# Patient Record
Sex: Male | Born: 1935
Health system: Southern US, Community
[De-identification: ages and names within clinical notes are randomized; demographics above are authoritative.]

## PROBLEM LIST (undated history)

## (undated) DIAGNOSIS — F39 Unspecified mood [affective] disorder: Secondary | ICD-10-CM

## (undated) DIAGNOSIS — C801 Malignant (primary) neoplasm, unspecified: Secondary | ICD-10-CM

## (undated) DIAGNOSIS — N400 Enlarged prostate without lower urinary tract symptoms: Secondary | ICD-10-CM

## (undated) DIAGNOSIS — M1990 Unspecified osteoarthritis, unspecified site: Secondary | ICD-10-CM

## (undated) DIAGNOSIS — G250 Essential tremor: Secondary | ICD-10-CM

## (undated) DIAGNOSIS — E079 Disorder of thyroid, unspecified: Secondary | ICD-10-CM

## (undated) DIAGNOSIS — K219 Gastro-esophageal reflux disease without esophagitis: Secondary | ICD-10-CM

## (undated) DIAGNOSIS — E039 Hypothyroidism, unspecified: Secondary | ICD-10-CM

## (undated) DIAGNOSIS — R0602 Shortness of breath: Secondary | ICD-10-CM

## (undated) DIAGNOSIS — R011 Cardiac murmur, unspecified: Secondary | ICD-10-CM

## (undated) DIAGNOSIS — I10 Essential (primary) hypertension: Secondary | ICD-10-CM

## (undated) DIAGNOSIS — E785 Hyperlipidemia, unspecified: Secondary | ICD-10-CM

## (undated) DIAGNOSIS — G4730 Sleep apnea, unspecified: Secondary | ICD-10-CM

## (undated) DIAGNOSIS — R55 Syncope and collapse: Secondary | ICD-10-CM

## (undated) HISTORY — PX: EYE SURGERY: SHX253

## (undated) HISTORY — DX: Unspecified mood (affective) disorder: F39

## (undated) HISTORY — DX: Shortness of breath: R06.02

## (undated) HISTORY — DX: Disorder of thyroid, unspecified: E07.9

## (undated) HISTORY — DX: Unspecified osteoarthritis, unspecified site: M19.90

## (undated) HISTORY — DX: Essential tremor: G25.0

## (undated) HISTORY — DX: Benign prostatic hyperplasia without lower urinary tract symptoms: N40.0

## (undated) HISTORY — PX: TONSILLECTOMY: SUR1361

## (undated) HISTORY — DX: Hyperlipidemia, unspecified: E78.5

## (undated) HISTORY — DX: Essential (primary) hypertension: I10

## (undated) HISTORY — DX: Gastro-esophageal reflux disease without esophagitis: K21.9

## (undated) HISTORY — PX: APPENDECTOMY: SHX54

## (undated) HISTORY — PX: HEMORROIDECTOMY: SUR656

## (undated) HISTORY — PX: INNER EAR SURGERY: SHX679

---

## 2002-03-02 ENCOUNTER — Ambulatory Visit (HOSPITAL_COMMUNITY): Admission: RE | Admit: 2002-03-02 | Discharge: 2002-03-02 | Payer: Self-pay | Admitting: Gastroenterology

## 2002-03-02 ENCOUNTER — Encounter (INDEPENDENT_AMBULATORY_CARE_PROVIDER_SITE_OTHER): Payer: Self-pay | Admitting: Specialist

## 2003-03-18 ENCOUNTER — Encounter: Admission: RE | Admit: 2003-03-18 | Discharge: 2003-03-18 | Payer: Self-pay | Admitting: Internal Medicine

## 2003-10-21 ENCOUNTER — Encounter: Admission: RE | Admit: 2003-10-21 | Discharge: 2003-10-21 | Payer: Self-pay | Admitting: Internal Medicine

## 2004-03-15 ENCOUNTER — Ambulatory Visit (HOSPITAL_COMMUNITY): Admission: RE | Admit: 2004-03-15 | Discharge: 2004-03-15 | Payer: Self-pay | Admitting: Urology

## 2009-08-29 ENCOUNTER — Encounter: Admission: RE | Admit: 2009-08-29 | Discharge: 2009-08-29 | Payer: Self-pay | Admitting: Gastroenterology

## 2009-08-30 ENCOUNTER — Ambulatory Visit (HOSPITAL_COMMUNITY): Admission: RE | Admit: 2009-08-30 | Discharge: 2009-08-30 | Payer: Self-pay | Admitting: Internal Medicine

## 2009-11-20 ENCOUNTER — Ambulatory Visit: Payer: Self-pay | Admitting: Cardiology

## 2010-05-08 ENCOUNTER — Ambulatory Visit: Payer: Self-pay | Admitting: Cardiology

## 2010-06-03 ENCOUNTER — Encounter: Payer: Self-pay | Admitting: *Deleted

## 2010-06-04 ENCOUNTER — Ambulatory Visit (INDEPENDENT_AMBULATORY_CARE_PROVIDER_SITE_OTHER): Payer: Medicare Other | Admitting: Cardiology

## 2010-06-04 ENCOUNTER — Encounter: Payer: Self-pay | Admitting: Cardiology

## 2010-06-04 DIAGNOSIS — N400 Enlarged prostate without lower urinary tract symptoms: Secondary | ICD-10-CM

## 2010-06-04 DIAGNOSIS — R42 Dizziness and giddiness: Secondary | ICD-10-CM

## 2010-06-04 DIAGNOSIS — E7800 Pure hypercholesterolemia, unspecified: Secondary | ICD-10-CM

## 2010-06-04 DIAGNOSIS — I119 Hypertensive heart disease without heart failure: Secondary | ICD-10-CM | POA: Insufficient documentation

## 2010-06-04 DIAGNOSIS — M1710 Unilateral primary osteoarthritis, unspecified knee: Secondary | ICD-10-CM

## 2010-06-04 NOTE — Progress Notes (Signed)
History of Present Illness: This 77 year old gentleman is seen for a scheduled six-month followup office visit.  He has a history of essential hypertension and hypercholesterolemia.  He had a normal treadmill Cardiolite stress test on 11/08/08.  He denies any recent chest pain or symptoms of congestive heart failure.  He has been having problems with feeling lightheaded when he stands up.  Current Outpatient Prescriptions  Medication Sig Dispense Refill  . amLODipine (NORVASC) 5 MG tablet Take 5 mg by mouth daily.        Marland Kitchen aspirin 81 MG tablet Take 81 mg by mouth daily.        Marland Kitchen atorvastatin (LIPITOR) 80 MG tablet Take 80 mg by mouth daily.        . Cholecalciferol (VITAMIN D) 1000 UNITS capsule Take 2,000 Units by mouth daily.        . Cyanocobalamin (B-12 PO) Take by mouth.        . dutasteride (AVODART) 0.5 MG capsule Take 0.5 mg by mouth daily.        Marland Kitchen levothyroxine (SYNTHROID, LEVOTHROID) 100 MCG tablet Take 100 mcg by mouth daily.        . nebivolol (BYSTOLIC) 5 MG tablet Take 5 mg by mouth at bedtime.        . Omega-3 Fatty Acids (FISH OIL) 300 MG CAPS Take 1 capsule by mouth 2 (two) times daily.        . Silodosin (RAPAFLO) 8 MG CAPS Take by mouth daily.        . Tamsulosin HCl (FLOMAX) 0.4 MG CAPS Take 0.4 mg by mouth daily.          No Known Allergies  Patient Active Problem List  Diagnoses  . Osteoarthritis of knee  . Dizziness  . Benign hypertensive heart disease without heart failure  . Hypercholesterolemia  . BPH (benign prostatic hyperplasia)    History  Smoking status  . Former Smoker -- 2.0 packs/day for 18 years  . Types: Cigarettes  . Quit date: 06/04/1970  Smokeless tobacco  . Never Used    History  Alcohol Use No    Family History  Problem Relation Age of Onset  . Heart attack Father     Review of Systems: Constitutional: no fever chills diaphoresis or fatigue or change in weight.  Head and neck: no hearing loss, no epistaxis, no photophobia or  visual disturbance. Respiratory: No cough, shortness of breath or wheezing. Cardiovascular: No chest pain peripheral edema, palpitations. Gastrointestinal: No abdominal distention, no abdominal pain, no change in bowel habits hematochezia or melena. Genitourinary: No dysuria, no frequency, no urgency, no nocturia. Musculoskeletal:No arthralgias, no back pain, no gait disturbance or myalgias. Neurological: No  headaches, no numbness, no seizures, no syncope, no weakness, no tremors. Hematologic: No lymphadenopathy, no easy bruising. Psychiatric: No confusion, no hallucinations, no sleep disturbance.    Physical Exam: Filed Vitals:   06/04/10 1325  BP: 140/70  Pulse:   His blood pressure supine is 150/70, sitting is 130/70, and standing is 140/70.  The pulse is regular.  The general appearance reveals a well-developed well-nourished elderly gentleman in no acute distress.Pupils equal and reactive.   Extraocular Movements are full.  There is no scleral icterus.  The mouth and pharynx are normal.  The neck is supple.  The carotids reveal no bruits.  The jugular venous pressure is normal.  The thyroid is not enlarged.  There is no lymphadenopathy.The chest is clear to percussion and auscultation. There are no  rales or rhonchi. Expansion of the chest is symmetrical.The precordium is quiet.  The first heart sound is normal.  The second heart sound is physiologically split.  There is no murmur gallop rub or click.  There is no abnormal lift or heave.The abdomen is soft and nontender. Bowel sounds are normal. The liver and spleen are not enlarged. There Are no abdominal masses. There are no bruits.The pedal pulses are good.  There is no phlebitis or edema.  There is no cyanosis or clubbing.   Assessment / Plan: We are going to have him reduce his amlodipine to just one half tablet daily.  If his symptoms of orthostatic hypotension persist he will leave off the amlodipine altogether but he is to continue  his Bystolic.He also had some over-the-counter Dramamine on hand if he has another episode of acute classical vertigo.  Recheck in 6 months for followup office visit.

## 2010-06-04 NOTE — Assessment & Plan Note (Addendum)
Former Scientist, product/process development.  Left knee pain.  Sees Dr. Lovey Newcomer.  Has had shots in knee.His knee problem prevents him from getting much regular intentional walking exercise.

## 2010-06-04 NOTE — Assessment & Plan Note (Addendum)
Recent vertigo while shaving.  Lasted less than 1 hour.The vertigo is characterized by a sensation of the room whirling around.  When he tried to lie down in his bed the vertigo became worse.  He said on his med for less than an hour and the symptoms resolved without therapy.  In addition however he has been experiencing predictable orthostatic hypotension symptoms when he stands up from a sitting position.  He is concerned that his blood pressure medication may be causing these symptoms.  He is on amlodipine,  Bystolic,And Flomax all of which could be contributing.

## 2010-06-04 NOTE — Assessment & Plan Note (Signed)
The patient states that his blood pressure has been good when he has checked it.

## 2010-07-10 NOTE — Op Note (Signed)
NAME:  Randy Sullivan, Randy Sullivan NO.:  [id number]   MEDICAL RECORD NO.:  [id number]                   PATIENT TYPE:  AMB   LOCATION:  ENDO                                 FACILITY:  [facility]   PHYSICIAN:  Petra Kuba, M.D.                 DATE OF BIRTH:  1928/06/06   DATE OF PROCEDURE:  03/02/2002  DATE OF DISCHARGE:                                 OPERATIVE REPORT   PROCEDURE:  Colonoscopy.   INDICATION:  Family history of colon cancer.  Some change in bowel habits.  Due for repeat screening with some diarrhea.  Consent was signed after  risks, benefits, methods, options thoroughly discussed in the office.   MEDICINES USED:  Demerol 50, Versed 5.   DESCRIPTION OF PROCEDURE:  Rectal inspection was pertinent for external  hemorrhoids.  Digital exam was negative.  The pediatric video adjustable  colonoscope was inserted, easily advanced around the colon to the cecum.  This did require some abdominal pressure but no position changes.  On  insertion, some left-sided diverticula were seen but no other abnormalities.  The scope was inserted a short ways in the terminal ileum.  Photodocumentation was obtained.  TI was normal.  A few scattered biopsies  were obtained and put in the first container, and a few random colon  biopsies of normal mucosa were obtained and put in the second container.  On  slow withdrawal through the colon, a proximal tiny transverse polyp was seen  and was hot biopsied x 1.  The left-sided diverticula were confirmed mostly  in the sigmoid as well as a few sigmoid hyperplastic-appearing polyps, some  of which were cold biopsied and put in the third container.  Then random  colon biopsies were obtained and put in the second container.  Once back in  the rectum, the scope was retroflexed, pertinent for some internal  hemorrhoids.  The scope was straightened and readvanced a short ways up the  left side of the colon; air was suctioned and the  scope removed.  The  patient tolerated the procedure well.  There was no obvious immediate  complication.   ENDOSCOPIC DIAGNOSES:  1. Internal-external hemorrhoids.  2. Left-sided diverticulosis .  3. A few hyperplastic-appearing sigmoid polyps, some cold biopsied.  4. One proximal transverse polyp, hot biopsied, a small polyp.  5. Otherwise within normal limits to the terminal ileum, status post random     biopsies throughout.    PLAN:  Await pathology to determine future colonic screening and to see if  microscopic colitis exists.  See how his symptoms go, but call me p.r.n. or  in six weeks to recheck symptoms and make sure no further work-up plans are  needed.  Petra Kuba, M.D.    MEM/MEDQ  D:  03/02/2002  T:  03/02/2002  Job:  [id number]   cc:   Larina Earthly, M.D.  [address]  [place]  [place] [address]  Fax: [phone]

## 2010-10-28 ENCOUNTER — Other Ambulatory Visit: Payer: Self-pay | Admitting: Gastroenterology

## 2011-04-15 DIAGNOSIS — C44519 Basal cell carcinoma of skin of other part of trunk: Secondary | ICD-10-CM | POA: Diagnosis not present

## 2011-04-15 DIAGNOSIS — L570 Actinic keratosis: Secondary | ICD-10-CM | POA: Diagnosis not present

## 2011-04-15 DIAGNOSIS — C44319 Basal cell carcinoma of skin of other parts of face: Secondary | ICD-10-CM | POA: Diagnosis not present

## 2011-04-29 DIAGNOSIS — C44319 Basal cell carcinoma of skin of other parts of face: Secondary | ICD-10-CM | POA: Diagnosis not present

## 2011-06-11 DIAGNOSIS — R1110 Vomiting, unspecified: Secondary | ICD-10-CM | POA: Diagnosis not present

## 2011-06-11 DIAGNOSIS — K2900 Acute gastritis without bleeding: Secondary | ICD-10-CM | POA: Diagnosis not present

## 2011-07-29 DIAGNOSIS — E039 Hypothyroidism, unspecified: Secondary | ICD-10-CM | POA: Diagnosis not present

## 2011-07-29 DIAGNOSIS — E7800 Pure hypercholesterolemia, unspecified: Secondary | ICD-10-CM | POA: Diagnosis not present

## 2011-07-29 DIAGNOSIS — I10 Essential (primary) hypertension: Secondary | ICD-10-CM | POA: Diagnosis not present

## 2011-07-29 DIAGNOSIS — S335XXA Sprain of ligaments of lumbar spine, initial encounter: Secondary | ICD-10-CM | POA: Diagnosis not present

## 2011-07-30 DIAGNOSIS — I10 Essential (primary) hypertension: Secondary | ICD-10-CM | POA: Diagnosis not present

## 2011-07-30 DIAGNOSIS — E7800 Pure hypercholesterolemia, unspecified: Secondary | ICD-10-CM | POA: Diagnosis not present

## 2011-09-08 DIAGNOSIS — M5030 Other cervical disc degeneration, unspecified cervical region: Secondary | ICD-10-CM | POA: Diagnosis not present

## 2011-09-08 DIAGNOSIS — M542 Cervicalgia: Secondary | ICD-10-CM | POA: Diagnosis not present

## 2011-09-08 DIAGNOSIS — I10 Essential (primary) hypertension: Secondary | ICD-10-CM | POA: Diagnosis not present

## 2011-09-08 DIAGNOSIS — M47812 Spondylosis without myelopathy or radiculopathy, cervical region: Secondary | ICD-10-CM | POA: Diagnosis not present

## 2011-09-24 DIAGNOSIS — E039 Hypothyroidism, unspecified: Secondary | ICD-10-CM | POA: Diagnosis not present

## 2011-09-24 DIAGNOSIS — E7800 Pure hypercholesterolemia, unspecified: Secondary | ICD-10-CM | POA: Diagnosis not present

## 2012-01-27 DIAGNOSIS — H04129 Dry eye syndrome of unspecified lacrimal gland: Secondary | ICD-10-CM | POA: Diagnosis not present

## 2012-01-27 DIAGNOSIS — H40019 Open angle with borderline findings, low risk, unspecified eye: Secondary | ICD-10-CM | POA: Diagnosis not present

## 2012-01-27 DIAGNOSIS — H1045 Other chronic allergic conjunctivitis: Secondary | ICD-10-CM | POA: Diagnosis not present

## 2012-01-27 DIAGNOSIS — H2510 Age-related nuclear cataract, unspecified eye: Secondary | ICD-10-CM | POA: Diagnosis not present

## 2012-01-28 DIAGNOSIS — E7800 Pure hypercholesterolemia, unspecified: Secondary | ICD-10-CM | POA: Diagnosis not present

## 2012-01-28 DIAGNOSIS — E039 Hypothyroidism, unspecified: Secondary | ICD-10-CM | POA: Diagnosis not present

## 2012-01-28 DIAGNOSIS — Z125 Encounter for screening for malignant neoplasm of prostate: Secondary | ICD-10-CM | POA: Diagnosis not present

## 2012-01-28 DIAGNOSIS — I10 Essential (primary) hypertension: Secondary | ICD-10-CM | POA: Diagnosis not present

## 2012-01-31 ENCOUNTER — Other Ambulatory Visit: Payer: Self-pay | Admitting: Internal Medicine

## 2012-01-31 ENCOUNTER — Ambulatory Visit
Admission: RE | Admit: 2012-01-31 | Discharge: 2012-01-31 | Disposition: A | Discharge status: Home / Self Care | Payer: Commercial Managed Care - PPO | Source: Ambulatory Visit | Attending: Internal Medicine | Admitting: Internal Medicine

## 2012-01-31 ENCOUNTER — Ambulatory Visit
Admission: RE | Admit: 2012-01-31 | Discharge: 2012-01-31 | Disposition: A | Discharge status: Home / Self Care | Payer: MEDICARE | Source: Ambulatory Visit | Attending: Internal Medicine | Admitting: Internal Medicine

## 2012-01-31 DIAGNOSIS — R0781 Pleurodynia: Secondary | ICD-10-CM

## 2012-01-31 DIAGNOSIS — R0602 Shortness of breath: Secondary | ICD-10-CM | POA: Diagnosis not present

## 2012-01-31 DIAGNOSIS — R071 Chest pain on breathing: Secondary | ICD-10-CM | POA: Diagnosis not present

## 2012-01-31 DIAGNOSIS — I10 Essential (primary) hypertension: Secondary | ICD-10-CM | POA: Diagnosis not present

## 2012-01-31 DIAGNOSIS — R1011 Right upper quadrant pain: Secondary | ICD-10-CM

## 2012-02-04 DIAGNOSIS — Z1212 Encounter for screening for malignant neoplasm of rectum: Secondary | ICD-10-CM | POA: Diagnosis not present

## 2012-02-08 DIAGNOSIS — Z0000 Encounter for general adult medical examination without abnormal findings: Secondary | ICD-10-CM | POA: Diagnosis not present

## 2012-02-08 DIAGNOSIS — Z125 Encounter for screening for malignant neoplasm of prostate: Secondary | ICD-10-CM | POA: Diagnosis not present

## 2012-02-08 DIAGNOSIS — Z1331 Encounter for screening for depression: Secondary | ICD-10-CM | POA: Diagnosis not present

## 2012-02-08 DIAGNOSIS — Z23 Encounter for immunization: Secondary | ICD-10-CM | POA: Diagnosis not present

## 2012-02-08 DIAGNOSIS — R071 Chest pain on breathing: Secondary | ICD-10-CM | POA: Diagnosis not present

## 2012-05-04 DIAGNOSIS — M1710 Unilateral primary osteoarthritis, unspecified knee: Secondary | ICD-10-CM | POA: Diagnosis not present

## 2012-06-07 DIAGNOSIS — M1710 Unilateral primary osteoarthritis, unspecified knee: Secondary | ICD-10-CM | POA: Diagnosis not present

## 2012-06-16 DIAGNOSIS — M1710 Unilateral primary osteoarthritis, unspecified knee: Secondary | ICD-10-CM | POA: Diagnosis not present

## 2012-06-21 DIAGNOSIS — Z85828 Personal history of other malignant neoplasm of skin: Secondary | ICD-10-CM | POA: Diagnosis not present

## 2012-06-21 DIAGNOSIS — L259 Unspecified contact dermatitis, unspecified cause: Secondary | ICD-10-CM | POA: Diagnosis not present

## 2012-06-21 DIAGNOSIS — L570 Actinic keratosis: Secondary | ICD-10-CM | POA: Diagnosis not present

## 2012-06-21 DIAGNOSIS — L821 Other seborrheic keratosis: Secondary | ICD-10-CM | POA: Diagnosis not present

## 2012-06-23 DIAGNOSIS — M1710 Unilateral primary osteoarthritis, unspecified knee: Secondary | ICD-10-CM | POA: Diagnosis not present

## 2012-07-25 DIAGNOSIS — L570 Actinic keratosis: Secondary | ICD-10-CM | POA: Diagnosis not present

## 2012-07-25 DIAGNOSIS — L259 Unspecified contact dermatitis, unspecified cause: Secondary | ICD-10-CM | POA: Diagnosis not present

## 2012-09-04 ENCOUNTER — Other Ambulatory Visit: Payer: Self-pay | Admitting: Internal Medicine

## 2012-09-04 DIAGNOSIS — H6120 Impacted cerumen, unspecified ear: Secondary | ICD-10-CM | POA: Diagnosis not present

## 2012-09-04 DIAGNOSIS — R109 Unspecified abdominal pain: Secondary | ICD-10-CM | POA: Diagnosis not present

## 2012-09-04 DIAGNOSIS — R82998 Other abnormal findings in urine: Secondary | ICD-10-CM | POA: Diagnosis not present

## 2012-09-04 DIAGNOSIS — Z6825 Body mass index (BMI) 25.0-25.9, adult: Secondary | ICD-10-CM | POA: Diagnosis not present

## 2012-09-04 DIAGNOSIS — G250 Essential tremor: Secondary | ICD-10-CM | POA: Diagnosis not present

## 2012-09-04 DIAGNOSIS — I10 Essential (primary) hypertension: Secondary | ICD-10-CM | POA: Diagnosis not present

## 2012-09-04 DIAGNOSIS — R6881 Early satiety: Secondary | ICD-10-CM

## 2012-09-04 DIAGNOSIS — R1011 Right upper quadrant pain: Secondary | ICD-10-CM | POA: Diagnosis not present

## 2012-09-04 DIAGNOSIS — E039 Hypothyroidism, unspecified: Secondary | ICD-10-CM | POA: Diagnosis not present

## 2012-09-06 DIAGNOSIS — H6120 Impacted cerumen, unspecified ear: Secondary | ICD-10-CM | POA: Diagnosis not present

## 2012-09-11 DIAGNOSIS — Z6825 Body mass index (BMI) 25.0-25.9, adult: Secondary | ICD-10-CM | POA: Diagnosis not present

## 2012-09-11 DIAGNOSIS — J309 Allergic rhinitis, unspecified: Secondary | ICD-10-CM | POA: Diagnosis not present

## 2012-09-11 DIAGNOSIS — J040 Acute laryngitis: Secondary | ICD-10-CM | POA: Diagnosis not present

## 2012-09-13 ENCOUNTER — Ambulatory Visit
Admission: RE | Admit: 2012-09-13 | Discharge: 2012-09-13 | Disposition: A | Discharge status: Home / Self Care | Payer: MEDICARE | Source: Ambulatory Visit | Attending: Internal Medicine | Admitting: Internal Medicine

## 2012-09-13 DIAGNOSIS — R6881 Early satiety: Secondary | ICD-10-CM

## 2013-01-23 DIAGNOSIS — H1045 Other chronic allergic conjunctivitis: Secondary | ICD-10-CM | POA: Diagnosis not present

## 2013-01-23 DIAGNOSIS — H40019 Open angle with borderline findings, low risk, unspecified eye: Secondary | ICD-10-CM | POA: Diagnosis not present

## 2013-01-23 DIAGNOSIS — H2510 Age-related nuclear cataract, unspecified eye: Secondary | ICD-10-CM | POA: Diagnosis not present

## 2013-01-23 DIAGNOSIS — H04129 Dry eye syndrome of unspecified lacrimal gland: Secondary | ICD-10-CM | POA: Diagnosis not present

## 2013-01-25 DIAGNOSIS — D485 Neoplasm of uncertain behavior of skin: Secondary | ICD-10-CM | POA: Diagnosis not present

## 2013-01-25 DIAGNOSIS — L280 Lichen simplex chronicus: Secondary | ICD-10-CM | POA: Diagnosis not present

## 2013-01-25 DIAGNOSIS — L820 Inflamed seborrheic keratosis: Secondary | ICD-10-CM | POA: Diagnosis not present

## 2013-01-25 DIAGNOSIS — L821 Other seborrheic keratosis: Secondary | ICD-10-CM | POA: Diagnosis not present

## 2013-02-05 DIAGNOSIS — E7800 Pure hypercholesterolemia, unspecified: Secondary | ICD-10-CM | POA: Diagnosis not present

## 2013-02-05 DIAGNOSIS — I10 Essential (primary) hypertension: Secondary | ICD-10-CM | POA: Diagnosis not present

## 2013-02-05 DIAGNOSIS — Z125 Encounter for screening for malignant neoplasm of prostate: Secondary | ICD-10-CM | POA: Diagnosis not present

## 2013-02-05 DIAGNOSIS — E039 Hypothyroidism, unspecified: Secondary | ICD-10-CM | POA: Diagnosis not present

## 2013-02-12 DIAGNOSIS — E039 Hypothyroidism, unspecified: Secondary | ICD-10-CM | POA: Diagnosis not present

## 2013-02-12 DIAGNOSIS — Z0000 Encounter for general adult medical examination without abnormal findings: Secondary | ICD-10-CM | POA: Diagnosis not present

## 2013-02-12 DIAGNOSIS — K5900 Constipation, unspecified: Secondary | ICD-10-CM | POA: Diagnosis not present

## 2013-02-12 DIAGNOSIS — M1990 Unspecified osteoarthritis, unspecified site: Secondary | ICD-10-CM | POA: Diagnosis not present

## 2013-02-12 DIAGNOSIS — N400 Enlarged prostate without lower urinary tract symptoms: Secondary | ICD-10-CM | POA: Diagnosis not present

## 2013-02-12 DIAGNOSIS — Z800 Family history of malignant neoplasm of digestive organs: Secondary | ICD-10-CM | POA: Diagnosis not present

## 2013-02-12 DIAGNOSIS — E7800 Pure hypercholesterolemia, unspecified: Secondary | ICD-10-CM | POA: Diagnosis not present

## 2013-02-12 DIAGNOSIS — Z125 Encounter for screening for malignant neoplasm of prostate: Secondary | ICD-10-CM | POA: Diagnosis not present

## 2013-02-12 DIAGNOSIS — I10 Essential (primary) hypertension: Secondary | ICD-10-CM | POA: Diagnosis not present

## 2013-02-12 DIAGNOSIS — Z23 Encounter for immunization: Secondary | ICD-10-CM | POA: Diagnosis not present

## 2013-03-14 DIAGNOSIS — Z1212 Encounter for screening for malignant neoplasm of rectum: Secondary | ICD-10-CM | POA: Diagnosis not present

## 2013-05-29 DIAGNOSIS — L819 Disorder of pigmentation, unspecified: Secondary | ICD-10-CM | POA: Diagnosis not present

## 2013-05-29 DIAGNOSIS — L570 Actinic keratosis: Secondary | ICD-10-CM | POA: Diagnosis not present

## 2013-05-29 DIAGNOSIS — K031 Abrasion of teeth: Secondary | ICD-10-CM | POA: Diagnosis not present

## 2013-05-29 DIAGNOSIS — L905 Scar conditions and fibrosis of skin: Secondary | ICD-10-CM | POA: Diagnosis not present

## 2013-05-29 DIAGNOSIS — L820 Inflamed seborrheic keratosis: Secondary | ICD-10-CM | POA: Diagnosis not present

## 2013-05-29 DIAGNOSIS — Z85828 Personal history of other malignant neoplasm of skin: Secondary | ICD-10-CM | POA: Diagnosis not present

## 2013-05-29 DIAGNOSIS — L821 Other seborrheic keratosis: Secondary | ICD-10-CM | POA: Diagnosis not present

## 2013-07-27 DIAGNOSIS — H02839 Dermatochalasis of unspecified eye, unspecified eyelid: Secondary | ICD-10-CM | POA: Diagnosis not present

## 2013-07-27 DIAGNOSIS — H2510 Age-related nuclear cataract, unspecified eye: Secondary | ICD-10-CM | POA: Diagnosis not present

## 2013-08-20 DIAGNOSIS — E039 Hypothyroidism, unspecified: Secondary | ICD-10-CM | POA: Diagnosis not present

## 2013-08-20 DIAGNOSIS — E7800 Pure hypercholesterolemia, unspecified: Secondary | ICD-10-CM | POA: Diagnosis not present

## 2013-08-20 DIAGNOSIS — N400 Enlarged prostate without lower urinary tract symptoms: Secondary | ICD-10-CM | POA: Diagnosis not present

## 2013-08-20 DIAGNOSIS — I10 Essential (primary) hypertension: Secondary | ICD-10-CM | POA: Diagnosis not present

## 2013-08-20 DIAGNOSIS — Z1331 Encounter for screening for depression: Secondary | ICD-10-CM | POA: Diagnosis not present

## 2013-08-20 DIAGNOSIS — S335XXA Sprain of ligaments of lumbar spine, initial encounter: Secondary | ICD-10-CM | POA: Diagnosis not present

## 2013-08-20 DIAGNOSIS — M1990 Unspecified osteoarthritis, unspecified site: Secondary | ICD-10-CM | POA: Diagnosis not present

## 2013-08-20 DIAGNOSIS — K5900 Constipation, unspecified: Secondary | ICD-10-CM | POA: Diagnosis not present

## 2013-08-20 DIAGNOSIS — J309 Allergic rhinitis, unspecified: Secondary | ICD-10-CM | POA: Diagnosis not present

## 2013-09-10 DIAGNOSIS — R339 Retention of urine, unspecified: Secondary | ICD-10-CM | POA: Diagnosis not present

## 2013-09-10 DIAGNOSIS — N139 Obstructive and reflux uropathy, unspecified: Secondary | ICD-10-CM | POA: Diagnosis not present

## 2013-09-10 DIAGNOSIS — K4020 Bilateral inguinal hernia, without obstruction or gangrene, not specified as recurrent: Secondary | ICD-10-CM | POA: Diagnosis not present

## 2013-09-10 DIAGNOSIS — N401 Enlarged prostate with lower urinary tract symptoms: Secondary | ICD-10-CM | POA: Diagnosis not present

## 2013-09-19 DIAGNOSIS — N3941 Urge incontinence: Secondary | ICD-10-CM | POA: Diagnosis not present

## 2013-09-19 DIAGNOSIS — R39198 Other difficulties with micturition: Secondary | ICD-10-CM | POA: Diagnosis not present

## 2013-10-17 DIAGNOSIS — R339 Retention of urine, unspecified: Secondary | ICD-10-CM | POA: Diagnosis not present

## 2013-10-17 DIAGNOSIS — N318 Other neuromuscular dysfunction of bladder: Secondary | ICD-10-CM | POA: Diagnosis not present

## 2013-10-17 DIAGNOSIS — N401 Enlarged prostate with lower urinary tract symptoms: Secondary | ICD-10-CM | POA: Diagnosis not present

## 2013-10-17 DIAGNOSIS — N138 Other obstructive and reflux uropathy: Secondary | ICD-10-CM | POA: Diagnosis not present

## 2013-12-14 DIAGNOSIS — Z23 Encounter for immunization: Secondary | ICD-10-CM | POA: Diagnosis not present

## 2014-02-05 DIAGNOSIS — R350 Frequency of micturition: Secondary | ICD-10-CM | POA: Diagnosis not present

## 2014-02-05 DIAGNOSIS — N3281 Overactive bladder: Secondary | ICD-10-CM | POA: Diagnosis not present

## 2014-02-05 DIAGNOSIS — R3915 Urgency of urination: Secondary | ICD-10-CM | POA: Diagnosis not present

## 2014-02-05 DIAGNOSIS — N3941 Urge incontinence: Secondary | ICD-10-CM | POA: Diagnosis not present

## 2014-02-12 DIAGNOSIS — R350 Frequency of micturition: Secondary | ICD-10-CM | POA: Diagnosis not present

## 2014-02-12 DIAGNOSIS — Z23 Encounter for immunization: Secondary | ICD-10-CM | POA: Diagnosis not present

## 2014-02-12 DIAGNOSIS — N3941 Urge incontinence: Secondary | ICD-10-CM | POA: Diagnosis not present

## 2014-02-12 DIAGNOSIS — R3915 Urgency of urination: Secondary | ICD-10-CM | POA: Diagnosis not present

## 2014-02-26 DIAGNOSIS — R350 Frequency of micturition: Secondary | ICD-10-CM | POA: Diagnosis not present

## 2014-02-26 DIAGNOSIS — N3941 Urge incontinence: Secondary | ICD-10-CM | POA: Diagnosis not present

## 2014-02-26 DIAGNOSIS — R3915 Urgency of urination: Secondary | ICD-10-CM | POA: Diagnosis not present

## 2014-02-27 DIAGNOSIS — Z6825 Body mass index (BMI) 25.0-25.9, adult: Secondary | ICD-10-CM | POA: Diagnosis not present

## 2014-02-27 DIAGNOSIS — M1812 Unilateral primary osteoarthritis of first carpometacarpal joint, left hand: Secondary | ICD-10-CM | POA: Diagnosis not present

## 2014-03-05 DIAGNOSIS — N3941 Urge incontinence: Secondary | ICD-10-CM | POA: Diagnosis not present

## 2014-03-05 DIAGNOSIS — R3915 Urgency of urination: Secondary | ICD-10-CM | POA: Diagnosis not present

## 2014-03-05 DIAGNOSIS — R350 Frequency of micturition: Secondary | ICD-10-CM | POA: Diagnosis not present

## 2014-03-12 DIAGNOSIS — N3941 Urge incontinence: Secondary | ICD-10-CM | POA: Diagnosis not present

## 2014-03-12 DIAGNOSIS — R3915 Urgency of urination: Secondary | ICD-10-CM | POA: Diagnosis not present

## 2014-03-12 DIAGNOSIS — R350 Frequency of micturition: Secondary | ICD-10-CM | POA: Diagnosis not present

## 2014-03-19 DIAGNOSIS — N3281 Overactive bladder: Secondary | ICD-10-CM | POA: Diagnosis not present

## 2014-03-19 DIAGNOSIS — R3915 Urgency of urination: Secondary | ICD-10-CM | POA: Diagnosis not present

## 2014-03-19 DIAGNOSIS — N3941 Urge incontinence: Secondary | ICD-10-CM | POA: Diagnosis not present

## 2014-03-19 DIAGNOSIS — R350 Frequency of micturition: Secondary | ICD-10-CM | POA: Diagnosis not present

## 2014-03-25 DIAGNOSIS — E780 Pure hypercholesterolemia: Secondary | ICD-10-CM | POA: Diagnosis not present

## 2014-03-25 DIAGNOSIS — E559 Vitamin D deficiency, unspecified: Secondary | ICD-10-CM | POA: Diagnosis not present

## 2014-03-25 DIAGNOSIS — Z125 Encounter for screening for malignant neoplasm of prostate: Secondary | ICD-10-CM | POA: Diagnosis not present

## 2014-03-25 DIAGNOSIS — I10 Essential (primary) hypertension: Secondary | ICD-10-CM | POA: Diagnosis not present

## 2014-03-25 DIAGNOSIS — E039 Hypothyroidism, unspecified: Secondary | ICD-10-CM | POA: Diagnosis not present

## 2014-03-26 DIAGNOSIS — N3941 Urge incontinence: Secondary | ICD-10-CM | POA: Diagnosis not present

## 2014-03-28 DIAGNOSIS — Z1212 Encounter for screening for malignant neoplasm of rectum: Secondary | ICD-10-CM | POA: Diagnosis not present

## 2014-04-01 DIAGNOSIS — Z6826 Body mass index (BMI) 26.0-26.9, adult: Secondary | ICD-10-CM | POA: Diagnosis not present

## 2014-04-01 DIAGNOSIS — Z1389 Encounter for screening for other disorder: Secondary | ICD-10-CM | POA: Diagnosis not present

## 2014-04-01 DIAGNOSIS — E559 Vitamin D deficiency, unspecified: Secondary | ICD-10-CM | POA: Diagnosis not present

## 2014-04-01 DIAGNOSIS — E780 Pure hypercholesterolemia: Secondary | ICD-10-CM | POA: Diagnosis not present

## 2014-04-01 DIAGNOSIS — M1990 Unspecified osteoarthritis, unspecified site: Secondary | ICD-10-CM | POA: Diagnosis not present

## 2014-04-01 DIAGNOSIS — H6120 Impacted cerumen, unspecified ear: Secondary | ICD-10-CM | POA: Diagnosis not present

## 2014-04-01 DIAGNOSIS — R251 Tremor, unspecified: Secondary | ICD-10-CM | POA: Diagnosis not present

## 2014-04-01 DIAGNOSIS — K5900 Constipation, unspecified: Secondary | ICD-10-CM | POA: Diagnosis not present

## 2014-04-01 DIAGNOSIS — E039 Hypothyroidism, unspecified: Secondary | ICD-10-CM | POA: Diagnosis not present

## 2014-04-01 DIAGNOSIS — Z008 Encounter for other general examination: Secondary | ICD-10-CM | POA: Diagnosis not present

## 2014-04-01 DIAGNOSIS — I10 Essential (primary) hypertension: Secondary | ICD-10-CM | POA: Diagnosis not present

## 2014-04-01 DIAGNOSIS — Z8719 Personal history of other diseases of the digestive system: Secondary | ICD-10-CM | POA: Diagnosis not present

## 2014-04-02 DIAGNOSIS — N3941 Urge incontinence: Secondary | ICD-10-CM | POA: Diagnosis not present

## 2014-04-09 DIAGNOSIS — R350 Frequency of micturition: Secondary | ICD-10-CM | POA: Diagnosis not present

## 2014-04-09 DIAGNOSIS — N3941 Urge incontinence: Secondary | ICD-10-CM | POA: Diagnosis not present

## 2014-04-09 DIAGNOSIS — R3915 Urgency of urination: Secondary | ICD-10-CM | POA: Diagnosis not present

## 2014-04-23 DIAGNOSIS — R350 Frequency of micturition: Secondary | ICD-10-CM | POA: Diagnosis not present

## 2014-04-23 DIAGNOSIS — R3915 Urgency of urination: Secondary | ICD-10-CM | POA: Diagnosis not present

## 2014-04-23 DIAGNOSIS — N3941 Urge incontinence: Secondary | ICD-10-CM | POA: Diagnosis not present

## 2014-04-30 DIAGNOSIS — R350 Frequency of micturition: Secondary | ICD-10-CM | POA: Diagnosis not present

## 2014-04-30 DIAGNOSIS — N3941 Urge incontinence: Secondary | ICD-10-CM | POA: Diagnosis not present

## 2014-04-30 DIAGNOSIS — R3915 Urgency of urination: Secondary | ICD-10-CM | POA: Diagnosis not present

## 2014-05-02 DIAGNOSIS — E039 Hypothyroidism, unspecified: Secondary | ICD-10-CM | POA: Diagnosis not present

## 2014-05-02 DIAGNOSIS — H6120 Impacted cerumen, unspecified ear: Secondary | ICD-10-CM | POA: Diagnosis not present

## 2014-05-02 DIAGNOSIS — Z6825 Body mass index (BMI) 25.0-25.9, adult: Secondary | ICD-10-CM | POA: Diagnosis not present

## 2014-05-02 DIAGNOSIS — I10 Essential (primary) hypertension: Secondary | ICD-10-CM | POA: Diagnosis not present

## 2014-05-07 DIAGNOSIS — R3915 Urgency of urination: Secondary | ICD-10-CM | POA: Diagnosis not present

## 2014-05-07 DIAGNOSIS — R350 Frequency of micturition: Secondary | ICD-10-CM | POA: Diagnosis not present

## 2014-05-07 DIAGNOSIS — N3281 Overactive bladder: Secondary | ICD-10-CM | POA: Diagnosis not present

## 2014-05-07 DIAGNOSIS — N3941 Urge incontinence: Secondary | ICD-10-CM | POA: Diagnosis not present

## 2014-05-28 DIAGNOSIS — R3915 Urgency of urination: Secondary | ICD-10-CM | POA: Diagnosis not present

## 2014-05-28 DIAGNOSIS — R350 Frequency of micturition: Secondary | ICD-10-CM | POA: Diagnosis not present

## 2014-05-28 DIAGNOSIS — N3941 Urge incontinence: Secondary | ICD-10-CM | POA: Diagnosis not present

## 2014-06-18 DIAGNOSIS — R3915 Urgency of urination: Secondary | ICD-10-CM | POA: Diagnosis not present

## 2014-06-18 DIAGNOSIS — R350 Frequency of micturition: Secondary | ICD-10-CM | POA: Diagnosis not present

## 2014-06-18 DIAGNOSIS — N3941 Urge incontinence: Secondary | ICD-10-CM | POA: Diagnosis not present

## 2014-07-09 DIAGNOSIS — N3941 Urge incontinence: Secondary | ICD-10-CM | POA: Diagnosis not present

## 2014-07-09 DIAGNOSIS — R350 Frequency of micturition: Secondary | ICD-10-CM | POA: Diagnosis not present

## 2014-07-09 DIAGNOSIS — R3915 Urgency of urination: Secondary | ICD-10-CM | POA: Diagnosis not present

## 2014-07-30 DIAGNOSIS — H2513 Age-related nuclear cataract, bilateral: Secondary | ICD-10-CM | POA: Diagnosis not present

## 2014-07-30 DIAGNOSIS — H40013 Open angle with borderline findings, low risk, bilateral: Secondary | ICD-10-CM | POA: Diagnosis not present

## 2014-07-30 DIAGNOSIS — N3941 Urge incontinence: Secondary | ICD-10-CM | POA: Diagnosis not present

## 2014-08-27 ENCOUNTER — Telehealth: Payer: Self-pay | Admitting: Cardiology

## 2014-08-27 DIAGNOSIS — Z6824 Body mass index (BMI) 24.0-24.9, adult: Secondary | ICD-10-CM | POA: Diagnosis not present

## 2014-08-27 DIAGNOSIS — R140 Abdominal distension (gaseous): Secondary | ICD-10-CM | POA: Diagnosis not present

## 2014-08-27 DIAGNOSIS — R0602 Shortness of breath: Secondary | ICD-10-CM | POA: Diagnosis not present

## 2014-08-27 DIAGNOSIS — K5900 Constipation, unspecified: Secondary | ICD-10-CM | POA: Diagnosis not present

## 2014-08-27 DIAGNOSIS — R0789 Other chest pain: Secondary | ICD-10-CM | POA: Diagnosis not present

## 2014-08-27 DIAGNOSIS — R1013 Epigastric pain: Secondary | ICD-10-CM | POA: Diagnosis not present

## 2014-08-27 DIAGNOSIS — I10 Essential (primary) hypertension: Secondary | ICD-10-CM | POA: Diagnosis not present

## 2014-08-27 NOTE — Telephone Encounter (Signed)
New Message  Pt wife calling about pt's current bout of SOB. Pt last saw Dr. Mare Ferrari 05/2010. Please call back and discuss.

## 2014-08-27 NOTE — Telephone Encounter (Signed)
Spoke with patients wife and patient has been having decreased energy, weight loss, and shortness of breath Patients wife states this has been going on for while  Patients has no known cardiac disease Advised wife  Dr. Mare Ferrari out of the office and needs to call PCP, verbalized understanding

## 2014-08-27 NOTE — Telephone Encounter (Signed)
Agree with advice given

## 2014-08-29 DIAGNOSIS — R350 Frequency of micturition: Secondary | ICD-10-CM | POA: Diagnosis not present

## 2014-08-29 DIAGNOSIS — R3915 Urgency of urination: Secondary | ICD-10-CM | POA: Diagnosis not present

## 2014-08-29 DIAGNOSIS — N3941 Urge incontinence: Secondary | ICD-10-CM | POA: Diagnosis not present

## 2014-09-16 DIAGNOSIS — E780 Pure hypercholesterolemia: Secondary | ICD-10-CM | POA: Diagnosis not present

## 2014-09-16 DIAGNOSIS — E559 Vitamin D deficiency, unspecified: Secondary | ICD-10-CM | POA: Diagnosis not present

## 2014-09-16 DIAGNOSIS — E039 Hypothyroidism, unspecified: Secondary | ICD-10-CM | POA: Diagnosis not present

## 2014-09-17 DIAGNOSIS — N3941 Urge incontinence: Secondary | ICD-10-CM | POA: Diagnosis not present

## 2014-09-17 DIAGNOSIS — R3915 Urgency of urination: Secondary | ICD-10-CM | POA: Diagnosis not present

## 2014-09-17 DIAGNOSIS — R350 Frequency of micturition: Secondary | ICD-10-CM | POA: Diagnosis not present

## 2014-10-03 DIAGNOSIS — E039 Hypothyroidism, unspecified: Secondary | ICD-10-CM | POA: Diagnosis not present

## 2014-10-03 DIAGNOSIS — Z8719 Personal history of other diseases of the digestive system: Secondary | ICD-10-CM | POA: Diagnosis not present

## 2014-10-03 DIAGNOSIS — Z6824 Body mass index (BMI) 24.0-24.9, adult: Secondary | ICD-10-CM | POA: Diagnosis not present

## 2014-10-03 DIAGNOSIS — M1990 Unspecified osteoarthritis, unspecified site: Secondary | ICD-10-CM | POA: Diagnosis not present

## 2014-10-03 DIAGNOSIS — R251 Tremor, unspecified: Secondary | ICD-10-CM | POA: Diagnosis not present

## 2014-10-03 DIAGNOSIS — E780 Pure hypercholesterolemia: Secondary | ICD-10-CM | POA: Diagnosis not present

## 2014-10-03 DIAGNOSIS — I10 Essential (primary) hypertension: Secondary | ICD-10-CM | POA: Diagnosis not present

## 2014-10-03 DIAGNOSIS — J302 Other seasonal allergic rhinitis: Secondary | ICD-10-CM | POA: Diagnosis not present

## 2014-10-08 DIAGNOSIS — N3941 Urge incontinence: Secondary | ICD-10-CM | POA: Diagnosis not present

## 2014-10-08 DIAGNOSIS — R3915 Urgency of urination: Secondary | ICD-10-CM | POA: Diagnosis not present

## 2014-10-08 DIAGNOSIS — R350 Frequency of micturition: Secondary | ICD-10-CM | POA: Diagnosis not present

## 2014-10-29 DIAGNOSIS — R3915 Urgency of urination: Secondary | ICD-10-CM | POA: Diagnosis not present

## 2014-10-29 DIAGNOSIS — R350 Frequency of micturition: Secondary | ICD-10-CM | POA: Diagnosis not present

## 2014-10-29 DIAGNOSIS — N3941 Urge incontinence: Secondary | ICD-10-CM | POA: Diagnosis not present

## 2014-10-31 DIAGNOSIS — N138 Other obstructive and reflux uropathy: Secondary | ICD-10-CM | POA: Diagnosis not present

## 2014-10-31 DIAGNOSIS — N401 Enlarged prostate with lower urinary tract symptoms: Secondary | ICD-10-CM | POA: Diagnosis not present

## 2014-11-19 DIAGNOSIS — R350 Frequency of micturition: Secondary | ICD-10-CM | POA: Diagnosis not present

## 2014-11-19 DIAGNOSIS — N3941 Urge incontinence: Secondary | ICD-10-CM | POA: Diagnosis not present

## 2014-11-19 DIAGNOSIS — R3915 Urgency of urination: Secondary | ICD-10-CM | POA: Diagnosis not present

## 2014-11-27 ENCOUNTER — Encounter: Payer: PRIVATE HEALTH INSURANCE | Admitting: Cardiology

## 2014-12-10 DIAGNOSIS — N3941 Urge incontinence: Secondary | ICD-10-CM | POA: Diagnosis not present

## 2014-12-20 ENCOUNTER — Encounter: Payer: Self-pay | Admitting: Cardiovascular Disease

## 2014-12-20 ENCOUNTER — Ambulatory Visit (INDEPENDENT_AMBULATORY_CARE_PROVIDER_SITE_OTHER): Payer: MEDICARE | Admitting: Cardiovascular Disease

## 2014-12-20 VITALS — BP 110/62 | HR 64 | Ht 72.0 in | Wt 180.3 lb

## 2014-12-20 DIAGNOSIS — R0602 Shortness of breath: Secondary | ICD-10-CM | POA: Diagnosis not present

## 2014-12-20 NOTE — Progress Notes (Signed)
Patient ID: CASYN BECVAR, male   DOB: 02/28/1928, 81 y.o.   MRN: [id number]     Cardiology Office Note   Date:  12/20/2014   ID:  Randy Sullivan, DOB [birth year], MRN [id number]  PCP:  Tivis Ringer, MD  Cardiologist:    Sanda Klein, MD   Chief Complaint  Patient presents with  . New Evaluation    SOB, fatigue, dizziness      History of Present Illness: Randy Sullivan is a 81 y.o. male who presents for Evaluation of occasional episodes of dyspnea and fatigue.   he has a long-standing history of systemic hypertension and hyperlipidemia. He last saw a cardiologist, Dr. Warren Danes, in 2012 and has not had any interval cardiac problems.  The chart reports a normal treadmill nuclear stress test in September 2010In the past he took more medications for hypertension but these have not been necessary in the recent past. He has lost quite a bit of weight and his blood pressure is lower. He does take propranolol , but this is indicated for essential tremor, especially prominent in his dominant right hand.   He describes occasional problems with shortness of breath that occurs inconsistently. He notices it when he walks to the bathroom in the middle of the night and then walks back to the bed. 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 He can walk up stairs without stopping to catch his breath. He has occasional mild dizziness in a pattern suggestive of orthostatic hypotension and he takes Flomax for prostatism.   Occasionally he describes very abrupt fatigue or has to stop and rest since he feels "drained". These episodes are also unpredictable. He has never experienced syncope and denies palpitations.   He describes intolerance to cold and is always chilly. He feels sleepy a lot. He is on levothyroxine for hypothyroidism but he had a normal thyroid profile in July  of this year when he saw Dr. Dagmar Hait.   Labs drawn at the same time show total cholesterol 266, triglycerides 181, HDL 30, LDL 200.    Past Medical History  Diagnosis Date  . Hypertension   . Hyperlipidemia   . Shortness of breath   . Prostatism   . Thyroid disease   . Osteoarthritis     No past surgical history on file.   Current Outpatient Prescriptions  Medication Sig Dispense Refill  . aspirin 81 MG tablet Take 81 mg by mouth daily.      . Cholecalciferol (VITAMIN D) 1000 UNITS capsule Take 2,000 Units by mouth daily.      . finasteride (PROSCAR) 5 MG tablet     . levothyroxine (SYNTHROID, LEVOTHROID) 112 MCG tablet Take 112 mcg by mouth daily before breakfast.     . pantoprazole (PROTONIX) 40 MG tablet Take 40 mg by mouth daily.     . propranolol (INDERAL) 10 MG tablet Take 10 mg by mouth 2 (two) times daily.     . Tamsulosin HCl (FLOMAX) 0.4 MG CAPS Take 0.4 mg by mouth daily.       No current facility-administered medications for this visit.    Allergies:   Review of patient's allergies indicates no known allergies.    Social History:  The patient  reports that he quit smoking about 44 years ago. His smoking use included Cigarettes. He has a 36 pack-year smoking history. He has never  used smokeless tobacco. He reports that he does not drink alcohol or use illicit drugs.   Family History:  The patient's family history includes Heart attack in his father.    ROS:  Please see the history of present illness.    Otherwise, review of systems positive for none.   All other systems are reviewed and negative.    PHYSICAL EXAM: VS:  BP 110/62 mmHg  Pulse 64  Ht 6' (1.829 m)  Wt 180 lb 4.8 oz (81.784 kg)  BMI 24.45 kg/m2  SpO2 99% , BMI Body mass index is 24.45 kg/(m^2).  General: Alert, oriented x3, no distress Head: no evidence of trauma, PERRL, EOMI, no exophtalmos or lid lag, no myxedema, no xanthelasma; normal ears, nose and oropharynx Neck: normal jugular venous  pulsations and no hepatojugular reflux; brisk carotid pulses without delay and no carotid bruits Chest: clear to auscultation, no signs of consolidation by percussion or palpation, normal fremitus, symmetrical and full respiratory excursions Cardiovascular: normal position and quality of the apical impulse, regular rhythm, normal first and second heart sounds, no murmurs, rubs or gallops Abdomen: no tenderness or distention, no masses by palpation, no abnormal pulsatility or arterial bruits, normal bowel sounds, no hepatosplenomegaly Extremities: no clubbing, cyanosis or edema; 2+ radial, ulnar and brachial pulses bilaterally; 2+ right femoral, posterior tibial and dorsalis pedis pulses; 2+ left femoral, posterior tibial and dorsalis pedis pulses; no subclavian or femoral bruits Neurological: grossly nonfocal Psych: euthymic mood, full affect   EKG:  EKG is ordered today. The ekg ordered today demonstrates  Normal sinus rhythm, normal tracing   incidental note is made of atherosclerotic ulcerated plaque in his mid descending thoracic aorta on a CT of the chest performed in 2011, study ordered for chest pain.  That same study showed very little if any calcium in the coronary tree   Recent Labs: No results found for requested labs within last 365 days.    Lipid Panel  total cholesterol 266, triglycerides 181, HDL 30, LDL 200.   Wt Readings from Last 3 Encounters:  12/20/14 180 lb 4.8 oz (81.784 kg)  06/04/10 192 lb (87.091 kg)      Other studies Reviewed: Additional studies/ records that were reviewed today include:  Multiple imaging studies and electrocardiograms.   ASSESSMENT AND PLAN:  1.  Inconsistent exertional dyspnea and fatigue. By physical exam, Mr. Lackey appears to have very little evidence of structural heart disease. At times his physical activity is remarkably intense for an 81 year old man and his symptoms are sometimes provoked by very mild exertion such as walking  back from the bathroom. Some of the symptoms he describes are vaguely suggestive of chronotropic incompetence , which in turn might be related to beta blocker therapy and his age.  I have recommended a treadmill stress test  In order to evaluate his functional capacity and an objective manner and also to look for signs of chronotropic incompetence.  2.  History of essential hypertension currently very well controlled  3.  Hypothyroidism well compensated on current dose of levothyroxine  4.  Hyperlipidemia , severely elevated LDL cholesterol and low HDL cholesterol. In the past he took atorvastatin. This has been discontinued. Mr. Russom does not recall having side effects from it. Even though he does not have clearly established significant vascular disease, the LDL elevation is quite striking and would generally lead to a recommendation for statin therapy. Would like to discuss this with Dr. Danne Baxter before restarting it.  Note presence of  aortic atherosclerosis, very little in the way of coronary calcification on CT chest 2011.  5.  Essential tremor , improved after beta blocker therapy according to both Mr. And Mrs. Percell Miller.   Current medicines are reviewed at length with the patient today.  The patient does not have concerns regarding medicines.  The following changes have been made:  no change  Labs/ tests ordered today include:  Orders Placed This Encounter  Procedures  . Exercise Tolerance Test  . EKG 12-Lead    Patient Instructions  Your physician has requested that you have an exercise tolerance test. For further information please visit [email or web address]. Please also follow instruction sheet, as given. We will call with the results.  Dr. Sallyanne Kuster recommends that you schedule a follow-up appointment in: one year       Signed, Sanda Klein, MD  12/20/2014 5:55 PM    Sanda Klein, MD, [facility] [phone] office [phone] pager

## 2014-12-20 NOTE — Patient Instructions (Signed)
Your physician has requested that you have an exercise tolerance test. For further information please visit [email or web address]. Please also follow instruction sheet, as given. We will call with the results.  Dr. Sallyanne Kuster recommends that you schedule a follow-up appointment in: one year

## 2014-12-23 DIAGNOSIS — Z23 Encounter for immunization: Secondary | ICD-10-CM | POA: Diagnosis not present

## 2014-12-26 ENCOUNTER — Encounter: Payer: Self-pay | Admitting: Cardiovascular Disease

## 2014-12-31 DIAGNOSIS — R3915 Urgency of urination: Secondary | ICD-10-CM | POA: Diagnosis not present

## 2014-12-31 DIAGNOSIS — R350 Frequency of micturition: Secondary | ICD-10-CM | POA: Diagnosis not present

## 2014-12-31 DIAGNOSIS — N3941 Urge incontinence: Secondary | ICD-10-CM | POA: Diagnosis not present

## 2015-01-07 DIAGNOSIS — E039 Hypothyroidism, unspecified: Secondary | ICD-10-CM | POA: Diagnosis not present

## 2015-01-07 DIAGNOSIS — F39 Unspecified mood [affective] disorder: Secondary | ICD-10-CM | POA: Diagnosis not present

## 2015-01-07 DIAGNOSIS — Z6824 Body mass index (BMI) 24.0-24.9, adult: Secondary | ICD-10-CM | POA: Diagnosis not present

## 2015-01-07 DIAGNOSIS — J302 Other seasonal allergic rhinitis: Secondary | ICD-10-CM | POA: Diagnosis not present

## 2015-01-07 DIAGNOSIS — I10 Essential (primary) hypertension: Secondary | ICD-10-CM | POA: Diagnosis not present

## 2015-01-07 DIAGNOSIS — E7800 Pure hypercholesterolemia, unspecified: Secondary | ICD-10-CM | POA: Diagnosis not present

## 2015-01-09 ENCOUNTER — Telehealth (HOSPITAL_COMMUNITY): Payer: Self-pay

## 2015-01-09 NOTE — Telephone Encounter (Signed)
Encounter complete. 

## 2015-01-14 ENCOUNTER — Ambulatory Visit (HOSPITAL_COMMUNITY)
Admission: RE | Admit: 2015-01-14 | Discharge: 2015-01-14 | Disposition: A | Discharge status: Home / Self Care | Payer: MEDICARE | Source: Ambulatory Visit | Attending: Cardiovascular Disease | Admitting: Cardiovascular Disease

## 2015-01-14 DIAGNOSIS — R0602 Shortness of breath: Secondary | ICD-10-CM | POA: Insufficient documentation

## 2015-01-14 LAB — EXERCISE TOLERANCE TEST
CSEPEW: 4.8 METS
CSEPHR: 100 %
Exercise duration (min): 3 min
Exercise duration (sec): 10 s
MPHR: 136 {beats}/min
Peak HR: 137 {beats}/min
RPE: 17
Rest HR: 85 {beats}/min

## 2015-01-15 DIAGNOSIS — Z85828 Personal history of other malignant neoplasm of skin: Secondary | ICD-10-CM | POA: Diagnosis not present

## 2015-01-15 DIAGNOSIS — L821 Other seborrheic keratosis: Secondary | ICD-10-CM | POA: Diagnosis not present

## 2015-01-15 DIAGNOSIS — L570 Actinic keratosis: Secondary | ICD-10-CM | POA: Diagnosis not present

## 2015-01-15 DIAGNOSIS — D1801 Hemangioma of skin and subcutaneous tissue: Secondary | ICD-10-CM | POA: Diagnosis not present

## 2015-01-21 DIAGNOSIS — R3915 Urgency of urination: Secondary | ICD-10-CM | POA: Diagnosis not present

## 2015-01-21 DIAGNOSIS — R350 Frequency of micturition: Secondary | ICD-10-CM | POA: Diagnosis not present

## 2015-01-21 DIAGNOSIS — N3941 Urge incontinence: Secondary | ICD-10-CM | POA: Diagnosis not present

## 2015-01-24 ENCOUNTER — Encounter (HOSPITAL_COMMUNITY): Payer: PRIVATE HEALTH INSURANCE

## 2015-02-11 DIAGNOSIS — N3941 Urge incontinence: Secondary | ICD-10-CM | POA: Diagnosis not present

## 2015-03-04 DIAGNOSIS — R3915 Urgency of urination: Secondary | ICD-10-CM | POA: Diagnosis not present

## 2015-03-04 DIAGNOSIS — R350 Frequency of micturition: Secondary | ICD-10-CM | POA: Diagnosis not present

## 2015-03-04 DIAGNOSIS — N3941 Urge incontinence: Secondary | ICD-10-CM | POA: Diagnosis not present

## 2015-03-10 DIAGNOSIS — R0602 Shortness of breath: Secondary | ICD-10-CM | POA: Diagnosis not present

## 2015-03-10 DIAGNOSIS — F39 Unspecified mood [affective] disorder: Secondary | ICD-10-CM | POA: Diagnosis not present

## 2015-03-10 DIAGNOSIS — Z9889 Other specified postprocedural states: Secondary | ICD-10-CM | POA: Diagnosis not present

## 2015-03-10 DIAGNOSIS — Z6825 Body mass index (BMI) 25.0-25.9, adult: Secondary | ICD-10-CM | POA: Diagnosis not present

## 2015-03-10 DIAGNOSIS — E038 Other specified hypothyroidism: Secondary | ICD-10-CM | POA: Diagnosis not present

## 2015-03-18 DIAGNOSIS — H2513 Age-related nuclear cataract, bilateral: Secondary | ICD-10-CM | POA: Diagnosis not present

## 2015-03-18 DIAGNOSIS — H5203 Hypermetropia, bilateral: Secondary | ICD-10-CM | POA: Diagnosis not present

## 2015-03-18 DIAGNOSIS — H532 Diplopia: Secondary | ICD-10-CM | POA: Diagnosis not present

## 2015-03-25 DIAGNOSIS — N3941 Urge incontinence: Secondary | ICD-10-CM | POA: Diagnosis not present

## 2015-03-25 DIAGNOSIS — R3915 Urgency of urination: Secondary | ICD-10-CM | POA: Diagnosis not present

## 2015-03-25 DIAGNOSIS — R350 Frequency of micturition: Secondary | ICD-10-CM | POA: Diagnosis not present

## 2015-04-15 DIAGNOSIS — R350 Frequency of micturition: Secondary | ICD-10-CM | POA: Diagnosis not present

## 2015-04-15 DIAGNOSIS — N3941 Urge incontinence: Secondary | ICD-10-CM | POA: Diagnosis not present

## 2015-04-15 DIAGNOSIS — R3915 Urgency of urination: Secondary | ICD-10-CM | POA: Diagnosis not present

## 2015-04-21 DIAGNOSIS — F39 Unspecified mood [affective] disorder: Secondary | ICD-10-CM | POA: Diagnosis not present

## 2015-04-21 DIAGNOSIS — E559 Vitamin D deficiency, unspecified: Secondary | ICD-10-CM | POA: Diagnosis not present

## 2015-04-21 DIAGNOSIS — R6889 Other general symptoms and signs: Secondary | ICD-10-CM | POA: Diagnosis not present

## 2015-04-21 DIAGNOSIS — R140 Abdominal distension (gaseous): Secondary | ICD-10-CM | POA: Diagnosis not present

## 2015-04-21 DIAGNOSIS — R251 Tremor, unspecified: Secondary | ICD-10-CM | POA: Diagnosis not present

## 2015-04-21 DIAGNOSIS — J302 Other seasonal allergic rhinitis: Secondary | ICD-10-CM | POA: Diagnosis not present

## 2015-04-21 DIAGNOSIS — E039 Hypothyroidism, unspecified: Secondary | ICD-10-CM | POA: Diagnosis not present

## 2015-04-21 DIAGNOSIS — K5900 Constipation, unspecified: Secondary | ICD-10-CM | POA: Diagnosis not present

## 2015-04-21 DIAGNOSIS — I10 Essential (primary) hypertension: Secondary | ICD-10-CM | POA: Diagnosis not present

## 2015-04-21 DIAGNOSIS — Z6825 Body mass index (BMI) 25.0-25.9, adult: Secondary | ICD-10-CM | POA: Diagnosis not present

## 2015-04-29 DIAGNOSIS — E7800 Pure hypercholesterolemia, unspecified: Secondary | ICD-10-CM | POA: Diagnosis not present

## 2015-04-29 DIAGNOSIS — E038 Other specified hypothyroidism: Secondary | ICD-10-CM | POA: Diagnosis not present

## 2015-04-29 DIAGNOSIS — I10 Essential (primary) hypertension: Secondary | ICD-10-CM | POA: Diagnosis not present

## 2015-04-29 DIAGNOSIS — E559 Vitamin D deficiency, unspecified: Secondary | ICD-10-CM | POA: Diagnosis not present

## 2015-04-29 DIAGNOSIS — Z125 Encounter for screening for malignant neoplasm of prostate: Secondary | ICD-10-CM | POA: Diagnosis not present

## 2015-05-13 DIAGNOSIS — R350 Frequency of micturition: Secondary | ICD-10-CM | POA: Diagnosis not present

## 2015-05-13 DIAGNOSIS — N3941 Urge incontinence: Secondary | ICD-10-CM | POA: Diagnosis not present

## 2015-05-13 DIAGNOSIS — R3915 Urgency of urination: Secondary | ICD-10-CM | POA: Diagnosis not present

## 2015-05-14 DIAGNOSIS — K5909 Other constipation: Secondary | ICD-10-CM | POA: Diagnosis not present

## 2015-05-14 DIAGNOSIS — Z6825 Body mass index (BMI) 25.0-25.9, adult: Secondary | ICD-10-CM | POA: Diagnosis not present

## 2015-05-14 DIAGNOSIS — E559 Vitamin D deficiency, unspecified: Secondary | ICD-10-CM | POA: Diagnosis not present

## 2015-05-14 DIAGNOSIS — R6889 Other general symptoms and signs: Secondary | ICD-10-CM | POA: Diagnosis not present

## 2015-05-14 DIAGNOSIS — E7800 Pure hypercholesterolemia, unspecified: Secondary | ICD-10-CM | POA: Diagnosis not present

## 2015-05-14 DIAGNOSIS — Z1389 Encounter for screening for other disorder: Secondary | ICD-10-CM | POA: Diagnosis not present

## 2015-05-14 DIAGNOSIS — M1990 Unspecified osteoarthritis, unspecified site: Secondary | ICD-10-CM | POA: Diagnosis not present

## 2015-05-14 DIAGNOSIS — I10 Essential (primary) hypertension: Secondary | ICD-10-CM | POA: Diagnosis not present

## 2015-05-14 DIAGNOSIS — E038 Other specified hypothyroidism: Secondary | ICD-10-CM | POA: Diagnosis not present

## 2015-05-14 DIAGNOSIS — Z0000 Encounter for general adult medical examination without abnormal findings: Secondary | ICD-10-CM | POA: Diagnosis not present

## 2015-05-14 DIAGNOSIS — N400 Enlarged prostate without lower urinary tract symptoms: Secondary | ICD-10-CM | POA: Diagnosis not present

## 2015-05-14 DIAGNOSIS — R251 Tremor, unspecified: Secondary | ICD-10-CM | POA: Diagnosis not present

## 2015-05-20 DIAGNOSIS — R351 Nocturia: Secondary | ICD-10-CM | POA: Diagnosis not present

## 2015-05-20 DIAGNOSIS — Z0000 Encounter for general adult medical examination without abnormal findings: Secondary | ICD-10-CM | POA: Diagnosis not present

## 2015-05-20 DIAGNOSIS — N3281 Overactive bladder: Secondary | ICD-10-CM | POA: Diagnosis not present

## 2015-05-28 DIAGNOSIS — M1712 Unilateral primary osteoarthritis, left knee: Secondary | ICD-10-CM | POA: Diagnosis not present

## 2015-05-28 DIAGNOSIS — S93422A Sprain of deltoid ligament of left ankle, initial encounter: Secondary | ICD-10-CM | POA: Diagnosis not present

## 2015-06-03 DIAGNOSIS — N3941 Urge incontinence: Secondary | ICD-10-CM | POA: Diagnosis not present

## 2015-06-03 DIAGNOSIS — R350 Frequency of micturition: Secondary | ICD-10-CM | POA: Diagnosis not present

## 2015-06-03 DIAGNOSIS — R3915 Urgency of urination: Secondary | ICD-10-CM | POA: Diagnosis not present

## 2015-06-24 DIAGNOSIS — R3915 Urgency of urination: Secondary | ICD-10-CM | POA: Diagnosis not present

## 2015-06-24 DIAGNOSIS — N3941 Urge incontinence: Secondary | ICD-10-CM | POA: Diagnosis not present

## 2015-06-24 DIAGNOSIS — R350 Frequency of micturition: Secondary | ICD-10-CM | POA: Diagnosis not present

## 2015-06-25 ENCOUNTER — Other Ambulatory Visit: Payer: Self-pay | Admitting: Internal Medicine

## 2015-06-25 DIAGNOSIS — R6889 Other general symptoms and signs: Secondary | ICD-10-CM | POA: Diagnosis not present

## 2015-06-25 DIAGNOSIS — R0609 Other forms of dyspnea: Secondary | ICD-10-CM | POA: Diagnosis not present

## 2015-06-25 DIAGNOSIS — N400 Enlarged prostate without lower urinary tract symptoms: Secondary | ICD-10-CM | POA: Diagnosis not present

## 2015-06-25 DIAGNOSIS — Z6824 Body mass index (BMI) 24.0-24.9, adult: Secondary | ICD-10-CM | POA: Diagnosis not present

## 2015-06-25 DIAGNOSIS — E7800 Pure hypercholesterolemia, unspecified: Secondary | ICD-10-CM | POA: Diagnosis not present

## 2015-06-25 DIAGNOSIS — R413 Other amnesia: Secondary | ICD-10-CM | POA: Diagnosis not present

## 2015-06-25 DIAGNOSIS — F39 Unspecified mood [affective] disorder: Secondary | ICD-10-CM | POA: Diagnosis not present

## 2015-06-30 ENCOUNTER — Other Ambulatory Visit: Payer: PRIVATE HEALTH INSURANCE

## 2015-07-02 ENCOUNTER — Ambulatory Visit
Admission: RE | Admit: 2015-07-02 | Discharge: 2015-07-02 | Disposition: A | Discharge status: Home / Self Care | Payer: MEDICARE | Source: Ambulatory Visit | Attending: Internal Medicine | Admitting: Internal Medicine

## 2015-07-02 DIAGNOSIS — R413 Other amnesia: Secondary | ICD-10-CM

## 2015-07-02 DIAGNOSIS — R51 Headache: Secondary | ICD-10-CM | POA: Diagnosis not present

## 2015-07-04 DIAGNOSIS — R0609 Other forms of dyspnea: Secondary | ICD-10-CM | POA: Diagnosis not present

## 2015-07-09 ENCOUNTER — Ambulatory Visit (INDEPENDENT_AMBULATORY_CARE_PROVIDER_SITE_OTHER): Payer: MEDICARE | Admitting: Neurology

## 2015-07-09 ENCOUNTER — Encounter: Payer: Self-pay | Admitting: Neurology

## 2015-07-09 VITALS — BP 162/70 | HR 72 | Resp 18 | Ht 72.0 in | Wt 179.0 lb

## 2015-07-09 DIAGNOSIS — G250 Essential tremor: Secondary | ICD-10-CM

## 2015-07-09 DIAGNOSIS — R0683 Snoring: Secondary | ICD-10-CM

## 2015-07-09 DIAGNOSIS — R0609 Other forms of dyspnea: Secondary | ICD-10-CM | POA: Diagnosis not present

## 2015-07-09 DIAGNOSIS — G4710 Hypersomnia, unspecified: Secondary | ICD-10-CM

## 2015-07-09 DIAGNOSIS — G478 Other sleep disorders: Secondary | ICD-10-CM

## 2015-07-09 DIAGNOSIS — G4761 Periodic limb movement disorder: Secondary | ICD-10-CM

## 2015-07-09 DIAGNOSIS — R351 Nocturia: Secondary | ICD-10-CM | POA: Diagnosis not present

## 2015-07-09 NOTE — Progress Notes (Addendum)
Subjective:    Patient ID: Randy Sullivan is a 82 y.o. male.  HPI     Star Age, MD, PhD [facility] [address], [address] [place], [place] [address]  Dear Dr. Dagmar Hait,   I saw your patient, Randy Sullivan, upon your kind request in my neurologic clinic today for initial consultation of his sleep disorder, in particular, concern for underlying obstructive sleep apnea. The patient is accompanied by his wife today. As you know, Randy Sullivan is a 82 year old right-handed gentleman with an underlying medical history of hypothyroidism, hypertension, reflux disease, BPH, actinic keratosis, degenerative joint disease, particularly of the knees, tremor, seasonal allergies, left ankle fracture, who reports snoring, daytime tiredness, nonrestorative sleep nocturia (3-4 x/night). I reviewed your office note from 06/25/2015. He quit smoking many years ago, over 30 years ago. He drinks alcohol occasionally, he does not use illicit drugs, he drinks non-caffeine coffee. Per wife, he is sleepy during the day. His Epworth sleepiness score is 12 out of 24 today, his fatigue score is 39/63. He is a retired Engineer, structural. His first wife passed away in 06/09/04. He remarried in 06-10-2007.  He is no longer on Proscar or Flomax, he had side effects from them, including feeling off balance, he is currently on Myrbetriq.  He takes low-dose propanolol, 10 mg twice daily for his tremors which has been helpful. He has had some weight loss and loss of appetite in the past weeks to months. He had recent blood work in your office, CMP and CBC. You also ordered a CT head without contrast. He was complaining of memory loss. He had a head CT without contrast on 07/02/2015: IMPRESSION: No acute abnormality noted.  He also had a recent nocturnal pulse oximetry test through your office, results are pending, this test was done just 2 nights ago. We will request test results from your office.  In addition, I  personally reviewed the images through the PACS system.  He complains of lack of energy and the need to lay down to rest and he may doze off.  He has aching in the L knee and he twitches his feet in sleep, per wife. He sees Dr. Maureen Ralphs for this has had injections to the L knee, including Synvisc.  Sometimes he has has bifrontal head pressure in the mornings.  He does not have a sleep apnea family history. He has an older sister, she is not very well, has not been diagnosed with sleep apnea. He has no children.  His Past Medical History Is Significant For: Past Medical History  Diagnosis Date  . Hypertension   . Hyperlipidemia   . Shortness of breath   . Prostatism   . Thyroid disease   . Osteoarthritis   . GERD (gastroesophageal reflux disease)   . BPH (benign prostatic hyperplasia)   . DJD (degenerative joint disease)   . Familial tremor   . Mood disorder ([place])     His Past Surgical History Is Significant For: Past Surgical History  Procedure Laterality Date  . Hemorroidectomy    . Appendectomy      His Family History Is Significant For: Family History  Problem Relation Age of Onset  . Heart attack Father   . Colon cancer Mother   . Colon cancer Sister     His Social History Is Significant For: Social History   Social History  . Marital Status: Married    Spouse Name: N/A  . Number of Children: N/A  .  Years of Education: N/A   Occupational History  . retired    Social History Main Topics  . Smoking status: Former Smoker -- 2.00 packs/day for 18 years    Types: Cigarettes    Quit date: 06/04/1970  . Smokeless tobacco: Never Used  . Alcohol Use: 0.0 oz/week    0 Standard drinks or equivalent per week  . Drug Use: No  . Sexual Activity: Not Asked   Other Topics Concern  . None   Social History Narrative   Rare caffeine use     His Allergies Are:  No Known Allergies:   His Current Medications Are:  Outpatient Encounter Prescriptions as of 07/09/2015   Medication Sig  . aspirin 81 MG tablet Take 81 mg by mouth daily.    . Cholecalciferol (VITAMIN D) 1000 UNITS capsule Take 2,000 Units by mouth daily.    Marland Kitchen levothyroxine (SYNTHROID, LEVOTHROID) 112 MCG tablet Take 112 mcg by mouth daily before breakfast.   . MYRBETRIQ 50 MG TB24 tablet   . propranolol (INDERAL) 10 MG tablet Take 10 mg by mouth 2 (two) times daily.   . [DISCONTINUED] finasteride (PROSCAR) 5 MG tablet   . [DISCONTINUED] pantoprazole (PROTONIX) 40 MG tablet Take 40 mg by mouth daily.   . [DISCONTINUED] Tamsulosin HCl (FLOMAX) 0.4 MG CAPS Take 0.4 mg by mouth daily.     No facility-administered encounter medications on file as of 07/09/2015.  :  Review of Systems:  Out of a complete 14 point review of systems, all are reviewed and negative with the exception of these symptoms as listed below:  Review of Systems  Constitutional: Positive for fatigue.  HENT: Positive for hearing loss.   Respiratory: Positive for shortness of breath.   Neurological: Positive for tremors.       Nocturia, some snoring, wakes up tired, daytime tiredness.   Hematological: Bruises/bleeds easily.  Psychiatric/Behavioral:       Too much sleep, memory loss   Epworth Sleepiness Scale 0= would never doze 1= slight chance of dozing 2= moderate chance of dozing 3= high chance of dozing  Sitting and reading:3 Watching TV:3 Sitting inactive in a public place (ex. Theater or meeting):0 As a passenger in a car for an hour without a break:0 Lying down to rest in the afternoon:3 Sitting and talking to someone:0 Sitting quietly after lunch (no alcohol):3 In a car, while stopped in traffic:0 Total:12  Objective:  Neurologic Exam  Physical Exam Physical Examination:   Filed Vitals:   07/09/15 0900  BP: 162/70  Pulse: 72  Resp: 18   General Examination: The patient is a very pleasant 82 y.o. male in no acute distress. He appears well-developed and well-nourished and well groomed.   HEENT:  Normocephalic, atraumatic, pupils are equal, round and reactive to light and accommodation. Funduscopic exam is difficult, d/t b/l cataracts. Extraocular tracking is good without limitation to gaze excursion or nystagmus noted. Normal smooth pursuit is noted. Hearing is Mildly impaired. Face is symmetric with normal facial animation and normal facial sensation. Speech is clear with no dysarthria noted. There is no lip, neck/head, jaw tremor, slight voice tremor, speech is slightly hypophonic. Neck is supple with full range of passive and active motion. There are no carotid bruits on auscultation. Oropharynx exam reveals: mild mouth dryness, adequate dental hygiene and mild airway crowding, due to redundant soft palate. Mallampati is class II. Tongue protrudes centrally and palate elevates symmetrically. Tonsils are absent. Neck size is 16 7/8 inches. He has a  Mild overbite.   Chest: Clear to auscultation without wheezing, rhonchi or crackles noted, ?able L basilar crackles.  Heart: S1+S2+0, regular and normal without murmurs, rubs or gallops noted.   Abdomen: Soft, non-tender and non-distended with normal bowel sounds appreciated on auscultation.  Extremities: There is no pitting edema in the distal lower extremities bilaterally. Pedal pulses are intact.  Skin: Warm and dry without trophic changes noted. There are no varicose veins.  Musculoskeletal: exam reveals no obvious joint deformities, tenderness or joint swelling or erythema, except L ankle slightly larger and L knee mildly swollen.   Neurologically:  Mental status: The patient is awake, alert and oriented in all 4 spheres. His immediate and remote memory, attention, language skills and fund of knowledge are fairly appropriate. There is no evidence of aphasia, agnosia, apraxia or anomia. Speech is clear with normal prosody and enunciation. Thought process is linear. Mood is normal and affect is normal.  Cranial nerves II - XII are as  described above under HEENT exam. In addition: shoulder shrug is normal with equal shoulder height noted. Motor exam: Normal bulk, strength and tone is noted. There is no drift, tremor or rebound, but handwriting slightly tremulous. Romberg is negative. Reflexes are 1+ throughout. Fine motor skills and coordination: slightly difficult with the UEs.   Cerebellar testing: No dysmetria or intention tremor on finger to nose testing. Heel to shin is difficult for him. unremarkable bilaterally. There is no truncal or gait ataxia.  Sensory exam: intact to in the upper and lower extremities.  Gait, station and balance: He stands with mild difficulty. No veering to one side is noted. No leaning to one side is noted. Posture is age-appropriate and stance is narrow based. Gait shows normal stride length and normal pace. No problems turning are noted. He turns cautiously, in 3 steps. Tandem walk is not possible.   Assessment and Plan:   In summary, PHILLIPPE RAYMO is a very pleasant 82 y.o.-year old male with an underlying medical history of hypothyroidism, hypertension, reflux disease, BPH, actinic keratosis, degenerative joint disease, particularly of the knees, tremor, seasonal allergies, left ankle fracture, whose history and physical exam  would be concerning for underlying obstructive sleep apnea. The patient has occasionally woken himself up with a sense of gasping. In addition, he also reports occasional shortness of breath with exertion. He may also have PLMS during sleep. I had a long chat with the patient and his wife about my findings and the diagnosis of OSA, its prognosis and treatment options. We talked about medical treatments, surgical interventions and non-pharmacological approaches. I explained in particular the risks and ramifications of untreated moderate to severe OSA, especially with respect to developing cardiovascular disease down the Road, including congestive heart failure, difficult to treat  hypertension, cardiac arrhythmias, or stroke. Even type 2 diabetes has, in part, been linked to untreated OSA. Symptoms of untreated OSA include daytime sleepiness, memory problems, mood irritability and mood disorder such as depression and anxiety, lack of energy, as well as recurrent headaches, especially morning headaches. We talked about trying to maintain a healthy lifestyle in general, as well as the importance of weight control. I encouraged the patient to eat healthy, exercise daily and keep well hydrated, to keep a scheduled bedtime and wake time routine, to not skip any meals and eat healthy snacks in between meals. I advised the patient not to drive when feeling sleepy. I recommended the following at this time: sleep study with potential positive airway pressure titration. (  We will score hypopneas at 4% and split the sleep study into diagnostic and treatment portion, if the estimated. 2 hour AHI is >15/h).   I explained the sleep test procedure to the patient and also outlined possible surgical and non-surgical treatment options of OSA, including the use of a custom-made dental device (which would require a referral to a specialist dentist or oral surgeon), upper airway surgical options, such as pillar implants, radiofrequency surgery, tongue base surgery, and UPPP (which would involve a referral to an ENT surgeon). Rarely, jaw surgery such as mandibular advancement may be considered.  I also explained the CPAP treatment option to the patient, who indicated that he would be willing to try CPAP if the need arises. I explained the importance of being compliant with PAP treatment, not only for insurance purposes but primarily to improve His symptoms, and for the patient's long term health benefit, including to reduce His cardiovascular risks. I answered all their questions today and the patient and his wife were in agreement. I would like to see him back after the sleep study is completed and encouraged  him to call with any interim questions, concerns, problems or updates.   Thank you very much for allowing me to participate in the care of this nice patient. If I can be of any further assistance to you please do not hesitate to call me at [phone].  Sincerely,   Star Age, MD, PhD

## 2015-07-09 NOTE — Patient Instructions (Signed)

## 2015-07-15 DIAGNOSIS — N3941 Urge incontinence: Secondary | ICD-10-CM | POA: Diagnosis not present

## 2015-07-15 DIAGNOSIS — R3915 Urgency of urination: Secondary | ICD-10-CM | POA: Diagnosis not present

## 2015-07-15 DIAGNOSIS — R350 Frequency of micturition: Secondary | ICD-10-CM | POA: Diagnosis not present

## 2015-07-30 ENCOUNTER — Ambulatory Visit (INDEPENDENT_AMBULATORY_CARE_PROVIDER_SITE_OTHER): Payer: MEDICARE | Admitting: Neurology

## 2015-07-30 DIAGNOSIS — G4761 Periodic limb movement disorder: Secondary | ICD-10-CM

## 2015-07-30 DIAGNOSIS — G4710 Hypersomnia, unspecified: Secondary | ICD-10-CM

## 2015-07-30 DIAGNOSIS — G4733 Obstructive sleep apnea (adult) (pediatric): Secondary | ICD-10-CM

## 2015-07-30 DIAGNOSIS — R9431 Abnormal electrocardiogram [ECG] [EKG]: Secondary | ICD-10-CM

## 2015-07-30 DIAGNOSIS — R351 Nocturia: Secondary | ICD-10-CM

## 2015-07-30 DIAGNOSIS — G4720 Circadian rhythm sleep disorder, unspecified type: Secondary | ICD-10-CM

## 2015-07-30 DIAGNOSIS — G478 Other sleep disorders: Secondary | ICD-10-CM | POA: Diagnosis not present

## 2015-08-07 ENCOUNTER — Telehealth: Payer: Self-pay | Admitting: Neurology

## 2015-08-07 NOTE — Telephone Encounter (Signed)
Patient referred by Dr. Dagmar Hait, seen by me on 07/09/15, diagnostic PSG on 07/30/15.   Please call and notify the patient that the recent sleep study showed mild obstructive sleep apnea (and he was on O2) and severe leg twitching, sleep disruption. Please inform patient that I would like to go over the details of the study during a follow up appointment. Arrange a followup appointment. Also, route or fax report to PCP and referring MD, if other than PCP.  Once you have spoken to patient, you can close this encounter.   Thanks,  Star Age, MD, PhD [facility])

## 2015-08-07 NOTE — Telephone Encounter (Signed)
I spoke to patient and he is aware of sleep study results. We were able to make appt for Monday to discuss study. I will send copy to PCP.

## 2015-08-08 ENCOUNTER — Telehealth: Payer: Self-pay | Admitting: Neurology

## 2015-08-08 NOTE — Telephone Encounter (Signed)
Error

## 2015-08-11 ENCOUNTER — Encounter: Payer: Self-pay | Admitting: Neurology

## 2015-08-11 ENCOUNTER — Ambulatory Visit (INDEPENDENT_AMBULATORY_CARE_PROVIDER_SITE_OTHER): Payer: MEDICARE | Admitting: Neurology

## 2015-08-11 VITALS — BP 159/83 | HR 76 | Resp 16 | Ht 72.0 in | Wt 181.0 lb

## 2015-08-11 DIAGNOSIS — R351 Nocturia: Secondary | ICD-10-CM | POA: Diagnosis not present

## 2015-08-11 DIAGNOSIS — G4761 Periodic limb movement disorder: Secondary | ICD-10-CM | POA: Diagnosis not present

## 2015-08-11 DIAGNOSIS — G4733 Obstructive sleep apnea (adult) (pediatric): Secondary | ICD-10-CM

## 2015-08-11 MED ORDER — GABAPENTIN 100 MG PO CAPS: 100.0000 mg | ORAL_CAPSULE | Freq: Every day | ORAL | Status: DC

## 2015-08-11 NOTE — Progress Notes (Signed)
Subjective:    Patient ID: Randy Sullivan is a 82 y.o. male.  HPI     Interim history:  Randy Sullivan is a 81 year old right-handed gentleman with an underlying medical history of hypothyroidism, hypertension, reflux disease, BPH, actinic keratosis, degenerative joint disease, particularly of the knees, tremor, seasonal allergies, left ankle fracture, who Presents for follow-up consultation of his sleep disorder, after his recent sleep study. The patient is accompanied by his wife again today. I first met him on 07/09/2015 at the request of his primary care physician, at which time the patient reported snoring, nonrestorative sleep, significant nocturia and daytime tiredness. He had recently undergone an overnight pulse oximetry test of which the results were pending at the time. In the interim, I reviewed his test results on 07/29/2015: Pulse oximetry overnight was done on 07/04/2015. Average oxygen saturation was 94.7%, total test time 8 hours and 9 minutes. Lowest oxygen saturation 85%, time below 88% saturation of 4.6 minutes. He was placed on oxygen at night by his primary care physician. He had a baseline sleep study on 07/30/2015: Sleep efficiency was 47.4%, latency to sleep 114.5 minutes, wake after sleep onset was 141.5 minutes with several longer periods of wakefulness and 6 restaurant breaks. The patient wanted to contact the sleep study with his home oxygen level of 2 L/m and therefore the study was done with his supplemental oxygen. He was noted to have severe PLMS. Index was 114.5 per hour, resulting in only 2.3 arousals per hour. He had rare PVCs on EKG. He had an increased percentage of stage II sleep, slow-wave sleep was 10.2%, REM sleep was 14.3%, REM latency was prolonged at 138 minutes. He had minimal snoring. Total AHI was 7.5 per hour. Average oxygen saturation was 97%, nadir was 94%, again with supplemental oxygen at 2 L/m.  Today, 08/11/15: He reports he feels about the same, per wife  has been feeling a little better, she does note his leg twitching in the last 2-3 months. Nocturia is about the same, has appointment with his urologist soon. Still on home O2 at night. While he denies frank restless leg symptoms, he does feel discomfort in his left knee and left ankle, prefers to sleep on his left side and sometimes wakes up with left hip discomfort.  Previously:   07/09/2015: He reports snoring, daytime tiredness, nonrestorative sleep nocturia (3-4 x/night). I reviewed your office note from 06/25/2015. He quit smoking many years ago, over 30 years ago. He drinks alcohol occasionally, he does not use illicit drugs, he drinks non-caffeine coffee. Per wife, he is sleepy during the day. His Epworth sleepiness score is 12 out of 24 today, his fatigue score is 39/63. He is a retired Engineer, structural. His first wife passed away in April 29, 2004. He remarried in 04-30-2007.  He is no longer on Proscar or Flomax, he had side effects from them, including feeling off balance, he is currently on Myrbetriq.   He takes low-dose propanolol, 10 mg twice daily for his tremors which has been helpful. He has had some weight loss and loss of appetite in the past weeks to months. He had recent blood work in your office, CMP and CBC. You also ordered a CT head without contrast. He was complaining of memory loss. He had a head CT without contrast on 07/02/2015: IMPRESSION: No acute abnormality noted.  He also had a recent nocturnal pulse oximetry test through your office, results are pending, this test was done just 2 nights ago. We will  request test results from your office.   In addition, I personally reviewed the images through the PACS system.   He complains of lack of energy and the need to lay down to rest and he may doze off.   He has aching in the L knee and he twitches his feet in sleep, per wife. He sees Dr. Maureen Ralphs for this has had injections to the L knee, including Synvisc.  Sometimes he has has bifrontal head  pressure in the mornings.   He does not have a sleep apnea family history. He has an older sister, she is not very well, has not been diagnosed with sleep apnea. He has no children.   His Past Medical History Is Significant For: Past Medical History  Diagnosis Date  . Hypertension   . Hyperlipidemia   . Shortness of breath   . Prostatism   . Thyroid disease   . Osteoarthritis   . GERD (gastroesophageal reflux disease)   . BPH (benign prostatic hyperplasia)   . DJD (degenerative joint disease)   . Familial tremor   . Mood disorder ([place])     Her Past Surgical History Is Significant For: Past Surgical History  Procedure Laterality Date  . Hemorroidectomy    . Appendectomy      His Family History Is Significant For: Family History  Problem Relation Age of Onset  . Heart attack Father   . Colon cancer Mother   . Colon cancer Sister     His Social History Is Significant For: Social History   Social History  . Marital Status: Married    Spouse Name: N/A  . Number of Children: N/A  . Years of Education: N/A   Occupational History  . retired    Social History Main Topics  . Smoking status: Former Smoker -- 2.00 packs/day for 18 years    Types: Cigarettes    Quit date: 06/04/1970  . Smokeless tobacco: Never Used  . Alcohol Use: 0.0 oz/week    0 Standard drinks or equivalent per week  . Drug Use: No  . Sexual Activity: Not Asked   Other Topics Concern  . None   Social History Narrative   Rare caffeine use     His Allergies Are:  No Known Allergies:   His Current Medications Are:  Outpatient Encounter Prescriptions as of 08/11/2015  Medication Sig  . aspirin 81 MG tablet Take 81 mg by mouth daily.    . Cholecalciferol (VITAMIN D) 1000 UNITS capsule Take 2,000 Units by mouth daily.    Marland Kitchen levothyroxine (SYNTHROID, LEVOTHROID) 112 MCG tablet Take 112 mcg by mouth daily before breakfast.   . MYRBETRIQ 50 MG TB24 tablet   . propranolol (INDERAL) 10 MG tablet  Take 10 mg by mouth 2 (two) times daily.    No facility-administered encounter medications on file as of 08/11/2015.  :  Review of Systems:  Out of a complete 14 point review of systems, all are reviewed and negative with the exception of these symptoms as listed below:   Review of Systems  Neurological:       Patient is here to discuss sleep study.       Objective:  Neurologic Exam  Physical Exam Physical Examination:   Filed Vitals:   08/11/15 1614  BP: 159/83  Pulse: 76  Resp: 16   General Examination: The patient is a very pleasant 82 y.o. male in no acute distress. He appears well-developed and well-nourished and well groomed. Is in good  spirits today.  HEENT: Normocephalic, atraumatic, pupils are equal, round and reactive to light and accommodation. Funduscopic exam is difficult, d/t b/l cataracts. Extraocular tracking is good without limitation to gaze excursion or nystagmus noted. Normal smooth pursuit is noted. Hearing is impaired. Face is symmetric with normal facial animation and normal facial sensation. Speech is clear with no dysarthria noted. There is no lip, neck/head, jaw tremor, slight voice tremor, speech is slightly hypophonic. Neck is supple with full range of passive and active motion. There are no carotid bruits on auscultation. Oropharynx exam reveals: mild mouth dryness, adequate dental hygiene and mild airway crowding, due to redundant soft palate. Mallampati is class II. Tongue protrudes centrally and palate elevates symmetrically.    Chest: Clear to auscultation without wheezing, rhonchi or crackles noted.  Heart: S1+S2+0, regular and normal without murmurs, rubs or gallops noted.   Abdomen: Soft, non-tender and non-distended with normal bowel sounds appreciated on auscultation.  Extremities: There is no pitting edema in the distal lower extremities bilaterally. Pedal pulses are intact.  Skin: Warm and dry without trophic changes noted. There are no  varicose veins.  Musculoskeletal: exam reveals no obvious joint deformities, tenderness or joint swelling or erythema, except L ankle slightly larger and L knee mildly swollen, mild discomfort in L hip.   Neurologically:  Mental status: The patient is awake, alert and oriented in all 4 spheres. His immediate and remote memory, attention, language skills and fund of knowledge are fairly appropriate. There is no evidence of aphasia, agnosia, apraxia or anomia. Speech is clear with normal prosody and enunciation. Thought process is linear. Mood is normal and affect is normal.  Cranial nerves II - XII are as described above under HEENT exam. In addition: shoulder shrug is normal with equal shoulder height noted. Motor exam: Normal bulk, strength and tone is noted. There is no drift, tremor or rebound, but handwriting slightly tremulous. Romberg is negative. Reflexes are 1+ throughout. Fine motor skills and coordination: slightly difficult with the UEs.   Cerebellar testing: No dysmetria or intention tremor on finger to nose testing. Heel to shin is difficult for him. unremarkable bilaterally. There is no truncal or gait ataxia.  Sensory exam: intact to in the upper and lower extremities.  Gait, station and balance: He stands with mild difficulty. No veering to one side is noted. No leaning to one side is noted. Posture is age-appropriate and stance is narrow based. Gait shows normal stride length and normal pace. No problems turning are noted. He turns cautiously, in 3 steps. Tandem walk is not possible.   Assessment and Plan:   In summary, Randy Sullivan is a very pleasant 82 year old male with an underlying medical history of hypothyroidism, hypertension, reflux disease, BPH, actinic keratosis, degenerative joint disease, particularly of the knees, tremor, seasonal allergies, left ankle fracture, who presents for follow-up consultation after his recent sleep study. This showed significant sleep  disruption, longer periods of wakefulness, significant nocturia, mild sleep apnea overall, desaturations probably masked by the use of oxygen supplementation. He had recently started oxygen treatment at home based on an abnormal pulse oximetry test. I explained his sleep study findings in detail to the patient and his wife. He also had significant leg twitching and endorses leg discomfort on the left side at night. We could potentially consider CPAP therapy down the Road but for now he can continue the oxygen his primary care physician prescribed. From my end of things, I suggested a trial of low-dose gabapentin  to see if we can reduce his discomfort at night and tone down the leg twitching. He is agreeable to trying this. To that end, I provided a prescription for gabapentin, 100 mg each night, we have room to increase as needed but for now he is advised to stay with a low dose, considering the age factor as well. I would like to see him back in a few months, at which time we will revisit his leg twitching, sleep consolidation, and if we consider CPAP therapy I explained to them that he would be requested to return for an overnight sleep test with CPAP during which we will not use oxygen treatment but CPAP therapy. Physical exam is stable today. I answered all their questions today and the patient and his wife were in agreement.  I spent 25 minutes in total face-to-face time with the patient, more than 50% of which was spent in counseling and coordination of care, reviewing test results, reviewing medication and discussing or reviewing the diagnosis of OSA, PLMD, the prognosis and treatment options.

## 2015-08-11 NOTE — Patient Instructions (Addendum)
For your leg discomfort and leg twitching at night, we will try you on low dose Neurontin (gabapentin) 100 mg strength: Take 1 pill nightly. The most common side effects reported are sedation or sleepiness. Rare side effects include balance problems, confusion.   At some point in the near future, we may want to try CPAP for your mild sleep apnea. For now, per Dr. Dagmar Hait, you can continue with your oxygen as prescribed.  If we decide to try CPAP, I would ask you to come back for a second sleep study.   Let's revisit this in 4 months.

## 2015-08-12 DIAGNOSIS — R3915 Urgency of urination: Secondary | ICD-10-CM | POA: Diagnosis not present

## 2015-08-12 DIAGNOSIS — N3941 Urge incontinence: Secondary | ICD-10-CM | POA: Diagnosis not present

## 2015-08-12 DIAGNOSIS — R350 Frequency of micturition: Secondary | ICD-10-CM | POA: Diagnosis not present

## 2015-09-02 DIAGNOSIS — R3915 Urgency of urination: Secondary | ICD-10-CM | POA: Diagnosis not present

## 2015-09-02 DIAGNOSIS — R350 Frequency of micturition: Secondary | ICD-10-CM | POA: Diagnosis not present

## 2015-09-02 DIAGNOSIS — N3941 Urge incontinence: Secondary | ICD-10-CM | POA: Diagnosis not present

## 2015-09-03 DIAGNOSIS — L821 Other seborrheic keratosis: Secondary | ICD-10-CM | POA: Diagnosis not present

## 2015-09-03 DIAGNOSIS — L570 Actinic keratosis: Secondary | ICD-10-CM | POA: Diagnosis not present

## 2015-09-03 DIAGNOSIS — L814 Other melanin hyperpigmentation: Secondary | ICD-10-CM | POA: Diagnosis not present

## 2015-09-03 DIAGNOSIS — Z85828 Personal history of other malignant neoplasm of skin: Secondary | ICD-10-CM | POA: Diagnosis not present

## 2015-09-10 DIAGNOSIS — S93491A Sprain of other ligament of right ankle, initial encounter: Secondary | ICD-10-CM | POA: Diagnosis not present

## 2015-09-10 DIAGNOSIS — M1711 Unilateral primary osteoarthritis, right knee: Secondary | ICD-10-CM | POA: Diagnosis not present

## 2015-09-19 DIAGNOSIS — M25561 Pain in right knee: Secondary | ICD-10-CM | POA: Diagnosis not present

## 2015-09-19 DIAGNOSIS — S93491A Sprain of other ligament of right ankle, initial encounter: Secondary | ICD-10-CM | POA: Diagnosis not present

## 2015-09-23 DIAGNOSIS — N3941 Urge incontinence: Secondary | ICD-10-CM | POA: Diagnosis not present

## 2015-09-23 DIAGNOSIS — R350 Frequency of micturition: Secondary | ICD-10-CM | POA: Diagnosis not present

## 2015-10-14 DIAGNOSIS — E038 Other specified hypothyroidism: Secondary | ICD-10-CM | POA: Diagnosis not present

## 2015-10-14 DIAGNOSIS — R0609 Other forms of dyspnea: Secondary | ICD-10-CM | POA: Diagnosis not present

## 2015-10-14 DIAGNOSIS — Z6824 Body mass index (BMI) 24.0-24.9, adult: Secondary | ICD-10-CM | POA: Diagnosis not present

## 2015-10-14 DIAGNOSIS — I10 Essential (primary) hypertension: Secondary | ICD-10-CM | POA: Diagnosis not present

## 2015-10-14 DIAGNOSIS — F39 Unspecified mood [affective] disorder: Secondary | ICD-10-CM | POA: Diagnosis not present

## 2015-10-14 DIAGNOSIS — N3941 Urge incontinence: Secondary | ICD-10-CM | POA: Diagnosis not present

## 2015-10-14 DIAGNOSIS — Z23 Encounter for immunization: Secondary | ICD-10-CM | POA: Diagnosis not present

## 2015-10-22 ENCOUNTER — Ambulatory Visit (INDEPENDENT_AMBULATORY_CARE_PROVIDER_SITE_OTHER): Payer: MEDICARE | Admitting: Pulmonary Disease

## 2015-10-22 ENCOUNTER — Ambulatory Visit (INDEPENDENT_AMBULATORY_CARE_PROVIDER_SITE_OTHER)
Admission: RE | Admit: 2015-10-22 | Discharge: 2015-10-22 | Disposition: A | Discharge status: Home / Self Care | Payer: MEDICARE | Source: Ambulatory Visit | Attending: Pulmonary Disease | Admitting: Pulmonary Disease

## 2015-10-22 ENCOUNTER — Encounter: Payer: Self-pay | Admitting: Pulmonary Disease

## 2015-10-22 VITALS — BP 138/88 | HR 73 | Ht 72.0 in | Wt 182.0 lb

## 2015-10-22 DIAGNOSIS — R0600 Dyspnea, unspecified: Secondary | ICD-10-CM

## 2015-10-22 DIAGNOSIS — G2581 Restless legs syndrome: Secondary | ICD-10-CM

## 2015-10-22 DIAGNOSIS — R0609 Other forms of dyspnea: Secondary | ICD-10-CM | POA: Diagnosis not present

## 2015-10-22 DIAGNOSIS — R05 Cough: Secondary | ICD-10-CM

## 2015-10-22 DIAGNOSIS — R0602 Shortness of breath: Secondary | ICD-10-CM | POA: Diagnosis not present

## 2015-10-22 DIAGNOSIS — G4733 Obstructive sleep apnea (adult) (pediatric): Secondary | ICD-10-CM | POA: Diagnosis not present

## 2015-10-22 DIAGNOSIS — R058 Other specified cough: Secondary | ICD-10-CM

## 2015-10-22 MED ORDER — MONTELUKAST SODIUM 10 MG PO TABS: 10.0000 mg | ORAL_TABLET | Freq: Every day | ORAL | 11 refills | Status: DC

## 2015-10-22 NOTE — Progress Notes (Signed)
   Subjective:    Patient ID: Randy Sullivan, male    DOB: Aug 31, 1928, 82 y.o.   MRN: [id number]  HPI    Review of Systems     Objective:   Physical Exam        Assessment & Plan:

## 2015-10-22 NOTE — Progress Notes (Signed)
Subjective:    Patient ID: Randy Sullivan, male    DOB: 1928/06/05, 82 y.o.   MRN: [id number]  HPI   This is the case of Randy Sullivan, 82 y.o. Male, who was referred by Dr. Prince Solian  in consultation regarding OSA.   As you very well know, patient smoked cigarettes roughly x 20 yrs, on and off, maybe 1 PPD. He has not been dxed with asthma or COPD.   Patient complains of hypersomnia, snoring, witnessed apneas, occasional gasping or choking. He had an ONO 2-3 months ago which showed significant o2 desatn. He was started on o2 2 L at HS c/o [employer].   Pt had a sleep study on July 31, 2015. AHI was 7.5. CPAP was held off. PLMSI was elevated at 115. His legs dont bother him much.   Recent SOB with ADLs x 2 mos. (-) cough, cp.   Has not been admitted nor has been on abx recently.      Review of Systems  Constitutional: Negative.  Negative for fever and unexpected weight change.  HENT: Positive for congestion, rhinorrhea and sinus pressure. Negative for dental problem, ear pain, nosebleeds, postnasal drip, sneezing, sore throat and trouble swallowing.   Eyes: Negative.  Negative for redness and itching.  Respiratory: Positive for shortness of breath. Negative for cough, chest tightness and wheezing.   Cardiovascular: Negative.  Negative for palpitations and leg swelling.  Gastrointestinal: Negative.  Negative for nausea and vomiting.  Endocrine: Negative.   Genitourinary: Negative.  Negative for dysuria.  Musculoskeletal: Positive for arthralgias. Negative for joint swelling.  Skin: Negative.  Negative for rash.  Allergic/Immunologic: Negative.   Neurological: Negative.  Negative for headaches.  Hematological: Negative.  Does not bruise/bleed easily.  Psychiatric/Behavioral: Negative.  The patient is not nervous/anxious.    Past Medical History:  Diagnosis Date  . BPH (benign prostatic hyperplasia)   . DJD (degenerative joint disease)   . Familial tremor   . GERD  (gastroesophageal reflux disease)   . Hyperlipidemia   . Hypertension   . Mood disorder ([place])   . Osteoarthritis   . Prostatism   . Shortness of breath   . Thyroid disease    (-) CA, DVT  Family History  Problem Relation Age of Onset  . Heart attack Father   . Colon cancer Mother   . Colon cancer Sister      Past Surgical History:  Procedure Laterality Date  . APPENDECTOMY    . HEMORROIDECTOMY      Social History   Social History  . Marital status: Married    Spouse name: N/A  . Number of children: N/A  . Years of education: N/A   Occupational History  . retired    Social History Main Topics  . Smoking status: Former Smoker    Packs/day: 2.00    Years: 18.00    Types: Cigarettes    Quit date: 06/04/1970  . Smokeless tobacco: Never Used  . Alcohol use 0.0 oz/week  . Drug use: No  . Sexual activity: Not on file   Other Topics Concern  . Not on file   Social History Narrative   Rare caffeine use    Married, did a lot of jobs: worked at a court, was a Medical laboratory scientific officer, worked as a Armed forces technical officer x 30 yrs.   No Known Allergies   Outpatient Medications Prior to Visit  Medication Sig Dispense Refill  . aspirin 81 MG tablet Take 81 mg by  mouth daily.      . Cholecalciferol (VITAMIN D) 1000 UNITS capsule Take 2,000 Units by mouth daily.      Marland Kitchen gabapentin (NEURONTIN) 100 MG capsule Take 1 capsule (100 mg total) by mouth at bedtime. 30 capsule 5  . levothyroxine (SYNTHROID, LEVOTHROID) 112 MCG tablet Take 112 mcg by mouth daily before breakfast.     . MYRBETRIQ 50 MG TB24 tablet     . propranolol (INDERAL) 10 MG tablet Take 10 mg by mouth 2 (two) times daily.      No facility-administered medications prior to visit.    Meds ordered this encounter  Medications  . montelukast (SINGULAIR) 10 MG tablet    Sig: Take 1 tablet (10 mg total) by mouth at bedtime.    Dispense:  30 tablet    Refill:  11        Objective:   Physical Exam  Vitals:  Vitals:     10/22/15 1450  BP: 138/88  Pulse: 73  SpO2: 96%  Weight: 182 lb (82.6 kg)  Height: 6' (1.829 m)    Constitutional/General:  Pleasant, well-nourished, well-developed, not in any distress,  Comfortably seating.  Well kempt  Body mass index is 24.68 kg/m. Wt Readings from Last 3 Encounters:  10/22/15 182 lb (82.6 kg)  08/11/15 181 lb (82.1 kg)  07/09/15 179 lb (81.2 kg)       HEENT: Pupils equal and reactive to light and accommodation. Anicteric sclerae. Normal nasal mucosa.   No oral  lesions,  mouth clear,  oropharynx clear, no postnasal drip. (-) Oral thrush. No dental caries.  Airway - Mallampati class III  Neck: No masses. Midline trachea. No JVD, (-) LAD. (-) bruits appreciated.  Respiratory/Chest: Grossly normal chest. (-) deformity. (-) Accessory muscle use.  Symmetric expansion. (-) Tenderness on palpation.  Resonant on percussion.  Diminished BS on both lower lung zones. (-) wheezing, crackles, rhonchi (-) egophony  Cardiovascular: Regular rate and  rhythm, heart sounds normal, no murmur or gallops, no peripheral edema  Gastrointestinal:  Normal bowel sounds. Soft, non-tender. No hepatosplenomegaly.  (-) masses.   Musculoskeletal:  Normal muscle tone. Normal gait.   Extremities: Grossly normal. (-) clubbing, cyanosis.  (-) edema  Skin: (-) rash,lesions seen.   Neurological/Psychiatric : alert, oriented to time, place, person. Normal mood and affect          Assessment & Plan:  OSA (obstructive sleep apnea) Patient complains of hypersomnia, snoring, witnessed apneas, occasional gasping or choking. He had an ONO 2-3 months ago which showed significant o2 desatn. He was started on o2 2 L at HS c/o [employer].   Pt had a sleep study on July 31, 2015. AHI was 7.5. CPAP was held off. PLMSI was elevated at 115. His legs dont bother him much.   After much discussion with pt and wife, we decided to give CPAP a try. Wife also complians of pt being slowly  more symptomatic as far as fatigue and hypersomnia is concerned.  If pt will not tolerate CPAP, he will turn it in.   We extensively discussed the diagnosis, pathophysiology, and treatment options for Obstructive Sleep Apnea (OSA).   We will start patient on autocpap 5-15 cm water.   Patient was instructed to call the office if he/she has not received the PAP device in 1-2 weeks.  Patient was instructed to have mask, tubings, filter, reservoir cleaned at least once a week with soapy water.  Patient was instructed to call the office if  he/she is having issues with the PAP device.    I advised patient to obtain sufficient amount of sleep --  7 to 8 hours at least in a 24 hr period.  Patient was advised to follow good sleep hygiene.  Patient was advised NOT to engage in activities requiring concentration and/or vigilance if he/she is and  sleepy.  Patient is NOT to drive if he/she is sleepy.        Upper airway cough syndrome Pt with recent sinus congestion. Start singulair. Cont flonase and zyrtec. If no change on singulair, plan to d/c  Exertional dyspnea patient smoked cigarettes roughly x 20 yrs, on and off, maybe 1 PPD.  R/O COPD. Recent exertional dyspnea. Not too bad however.  Plan : 1. Needs PFT, ABF. 2. Trial with Breo.  I am avoiding anticholinergic 2/2 prostate issues/nocturia. 3. May also benefit from neb meds if ever.    RLS (restless legs syndrome) Pt with RLS. PSG with eleveated PLMSI at 115. Started on Gabapentin at HS. Trial with cpap. May wean off Gabapentin. Will need ferritin checked, CBC, BMP on follow up.     Thank you very much for letting me participate in this patient's care. Please do not hesitate to give me a call if you have any questions or concerns regarding the treatment plan.   Patient will follow up with me in 6-8 weeks.     Monica Becton, MD 10/23/2015   5:19 AM [facility] [place] Pager:  [phone] Office: [phone], Fax: [phone]

## 2015-10-22 NOTE — Patient Instructions (Signed)
  It was a pleasure taking care of you today!  You are diagnosed with Obstructive Sleep Apnea or OSA.  You stop breathing  8 times/hr.   We will order you an autocpap machine, 5-15 cm water. We will order this through [facility]. Please plug your o2 to your cpap machine.  Please call the office if you do NOT receive your machine in the next 1-2 weeks.   Please make sure you use your CPAP device everytime you sleep.  We will monitor the usage of your machine per your insurance requirement.  Your insurance company may take the machine from you if you are not using it regularly.   Please clean the mask, tubings, filter, water reservoir with soapy water every week.  Please use distilled water for the water reservoir.   Please call the office or your machine provider (DME company) if you are having issues with the device.   Start singulair, 10 mg/tab, 1 tab at bedtime. Continue Flonase and Cetirizine.   Start Breo  1 puff daily. We will order you a chest x-ray and a breathing test.  Return to clinic in 6 weeks with Dr. Corrie Dandy or NP.

## 2015-10-23 DIAGNOSIS — R0609 Other forms of dyspnea: Secondary | ICD-10-CM

## 2015-10-23 DIAGNOSIS — R05 Cough: Secondary | ICD-10-CM | POA: Insufficient documentation

## 2015-10-23 DIAGNOSIS — G2581 Restless legs syndrome: Secondary | ICD-10-CM | POA: Insufficient documentation

## 2015-10-23 DIAGNOSIS — G4733 Obstructive sleep apnea (adult) (pediatric): Secondary | ICD-10-CM | POA: Insufficient documentation

## 2015-10-23 DIAGNOSIS — R058 Other specified cough: Secondary | ICD-10-CM | POA: Insufficient documentation

## 2015-10-23 NOTE — Assessment & Plan Note (Signed)
patient smoked cigarettes roughly x 20 yrs, on and off, maybe 1 PPD.  R/O COPD. Recent exertional dyspnea. Not too bad however.  Plan : 1. Needs PFT, ABF. 2. Trial with Breo.  I am avoiding anticholinergic 2/2 prostate issues/nocturia. 3. May also benefit from neb meds if ever.

## 2015-10-23 NOTE — Assessment & Plan Note (Signed)
Patient complains of hypersomnia, snoring, witnessed apneas, occasional gasping or choking. He had an ONO 2-3 months ago which showed significant o2 desatn. He was started on o2 2 L at HS c/o [employer].   Pt had a sleep study on July 31, 2015. AHI was 7.5. CPAP was held off. PLMSI was elevated at 115. His legs dont bother him much.   After much discussion with pt and wife, we decided to give CPAP a try. Wife also complians of pt being slowly more symptomatic as far as fatigue and hypersomnia is concerned.  If pt will not tolerate CPAP, he will turn it in.   We extensively discussed the diagnosis, pathophysiology, and treatment options for Obstructive Sleep Apnea (OSA).   We will start patient on autocpap 5-15 cm water.   Patient was instructed to call the office if he/she has not received the PAP device in 1-2 weeks.  Patient was instructed to have mask, tubings, filter, reservoir cleaned at least once a week with soapy water.  Patient was instructed to call the office if he/she is having issues with the PAP device.    I advised patient to obtain sufficient amount of sleep --  7 to 8 hours at least in a 24 hr period.  Patient was advised to follow good sleep hygiene.  Patient was advised NOT to engage in activities requiring concentration and/or vigilance if he/she is and  sleepy.  Patient is NOT to drive if he/she is sleepy.

## 2015-10-23 NOTE — Assessment & Plan Note (Signed)
Pt with recent sinus congestion. Start singulair. Cont flonase and zyrtec. If no change on singulair, plan to d/c

## 2015-10-23 NOTE — Assessment & Plan Note (Signed)
Pt with RLS. PSG with eleveated PLMSI at 115. Started on Gabapentin at HS. Trial with cpap. May wean off Gabapentin. Will need ferritin checked, CBC, BMP on follow up.

## 2015-10-30 ENCOUNTER — Other Ambulatory Visit: Payer: Self-pay

## 2015-10-30 MED ORDER — FLUTICASONE FUROATE-VILANTEROL 100-25 MCG/INH IN AEPB: 1.0000 | INHALATION_SPRAY | Freq: Every day | RESPIRATORY_TRACT | 5 refills | Status: DC

## 2015-10-31 DIAGNOSIS — R509 Fever, unspecified: Secondary | ICD-10-CM | POA: Diagnosis not present

## 2015-10-31 DIAGNOSIS — N390 Urinary tract infection, site not specified: Secondary | ICD-10-CM | POA: Diagnosis not present

## 2015-10-31 DIAGNOSIS — R350 Frequency of micturition: Secondary | ICD-10-CM | POA: Diagnosis not present

## 2015-10-31 DIAGNOSIS — R358 Other polyuria: Secondary | ICD-10-CM | POA: Diagnosis not present

## 2015-10-31 DIAGNOSIS — Z6825 Body mass index (BMI) 25.0-25.9, adult: Secondary | ICD-10-CM | POA: Diagnosis not present

## 2015-10-31 DIAGNOSIS — I10 Essential (primary) hypertension: Secondary | ICD-10-CM | POA: Diagnosis not present

## 2015-10-31 DIAGNOSIS — R309 Painful micturition, unspecified: Secondary | ICD-10-CM | POA: Diagnosis not present

## 2015-10-31 DIAGNOSIS — R6883 Chills (without fever): Secondary | ICD-10-CM | POA: Diagnosis not present

## 2015-10-31 DIAGNOSIS — R300 Dysuria: Secondary | ICD-10-CM | POA: Diagnosis not present

## 2015-11-03 DIAGNOSIS — M25531 Pain in right wrist: Secondary | ICD-10-CM | POA: Diagnosis not present

## 2015-11-04 DIAGNOSIS — N3281 Overactive bladder: Secondary | ICD-10-CM | POA: Diagnosis not present

## 2015-11-07 ENCOUNTER — Other Ambulatory Visit (HOSPITAL_COMMUNITY): Payer: MEDICARE

## 2015-11-17 ENCOUNTER — Encounter: Payer: Self-pay | Admitting: Pulmonary Disease

## 2015-11-25 ENCOUNTER — Encounter: Payer: Self-pay | Admitting: Physical Therapy

## 2015-11-25 ENCOUNTER — Ambulatory Visit: Payer: MEDICARE | Attending: Internal Medicine | Admitting: Physical Therapy

## 2015-11-25 DIAGNOSIS — R2681 Unsteadiness on feet: Secondary | ICD-10-CM | POA: Diagnosis not present

## 2015-11-25 DIAGNOSIS — R2689 Other abnormalities of gait and mobility: Secondary | ICD-10-CM | POA: Insufficient documentation

## 2015-11-25 DIAGNOSIS — R339 Retention of urine, unspecified: Secondary | ICD-10-CM | POA: Diagnosis not present

## 2015-11-25 DIAGNOSIS — R350 Frequency of micturition: Secondary | ICD-10-CM | POA: Diagnosis not present

## 2015-11-25 DIAGNOSIS — N3941 Urge incontinence: Secondary | ICD-10-CM | POA: Diagnosis not present

## 2015-11-25 DIAGNOSIS — M6281 Muscle weakness (generalized): Secondary | ICD-10-CM

## 2015-11-25 NOTE — Therapy (Signed)
[facility] [address] [place], [place], [address] Phone: [phone]   Fax:  [phone]  Physical Therapy Evaluation  Patient Details  Name: Randy Sullivan MRN: [id number] Date of Birth: [birth year] Referring Provider: Prince Solian, MD  Encounter Date: 11/25/2015      PT End of Session - 11/25/15 1051    Visit Number 1   Number of Visits 17   Date for PT Re-Evaluation 01/23/16   Authorization Type Medicare [employer]   Authorization Time Period G-Code every 10th visit   PT Start Time 0930   PT Stop Time 1016   PT Time Calculation (min) 46 min   Equipment Utilized During Treatment Gait belt   Activity Tolerance Patient tolerated treatment well   Behavior During Therapy WFL for tasks assessed/performed      Past Medical History:  Diagnosis Date  . BPH (benign prostatic hyperplasia)   . DJD (degenerative joint disease)   . Familial tremor   . GERD (gastroesophageal reflux disease)   . Hyperlipidemia   . Hypertension   . Mood disorder ([place])   . Osteoarthritis   . Prostatism   . Shortness of breath   . Thyroid disease     Past Surgical History:  Procedure Laterality Date  . APPENDECTOMY    . HEMORROIDECTOMY      There were no vitals filed for this visit.       Subjective Assessment - 11/25/15 0937    Subjective Pt reports that his "eqilibrium is way off" and that he has "two bad knees and ankles" that limit him in his activities.  Also states that he has a difficult time staning from a seated position and walking on uneven surfaces.   Patient is accompained by: Family member  wife   Pertinent History Goes by "[name]", Hypothyroidism, HTN, actinic keratosis, DJD, OA, tremor, L ankle fx   Limitations Walking;Standing;House hold activities   Patient Stated Goals "To get back to my normal way of doing things."   Currently in Pain? No/denies   Multiple Pain Sites No            [facility] PT  Assessment - 11/25/15 0930      Assessment   Medical Diagnosis Chronic tremor, weakness fatigue   Referring Provider Prince Solian, MD   Onset Date/Surgical Date 11/11/15   Hand Dominance Right  Learning to write with L hand     Balance Screen   Has the patient fallen in the past 6 months Yes   How many times? 2   Has the patient had a decrease in activity level because of a fear of falling?  Yes   Is the patient reluctant to leave their home because of a fear of falling?  No     Home Environment   Living Environment Private residence   Living Arrangements Spouse/significant other   Available Help at Discharge Family   Type of [place] to enter   Entrance Stairs-Number of Steps 2  5-6 in the front; both side hand rails; can't reach both   Entrance Stairs-Rails Right;Left;Cannot reach both   [place] One level;Laundry or work area in [employer] and exercise room in basement; 12 steps; R side up   [employer] - 2 wheels;Cane - single point;Grab bars - tub/shower     Prior Function   Level of Independence Independent   Vocation Retired   Leisure Travel  ROM / Strength   AROM / PROM / Strength AROM;Strength     AROM   Overall AROM  Within functional limits for tasks performed     Strength   Overall Strength Deficits   Strength Assessment Site Shoulder;Hip;Knee;Ankle   Right/Left Shoulder Right;Left   Right Shoulder Flexion 3+/5   Left Shoulder Flexion 3+/5   Right/Left Hip Right;Left   Right Hip Flexion 3/5   Right Hip Extension 3/5  Tested in standing with B UE on countertop   Right Hip ABduction 3-/5   Left Hip Flexion 3/5   Left Hip Extension 3-/5  Tested in standing with B UE on countertop   Left Hip ABduction 3-/5   Right/Left Knee Right;Left   Right Knee Flexion 4-/5   Right Knee Extension 4-/5   Left Knee Flexion 4-/5   Left Knee Extension 4-/5   Right/Left Ankle Right;Left   Right Ankle Dorsiflexion 4-/5    Left Ankle Dorsiflexion 4-/5     Transfers   Transfers Sit to Stand;Stand to Sit;Stand Pivot Transfers   Sit to Stand 5: Supervision;With upper extremity assist;Without upper extremity assist;With armrests;From chair/3-in-1   Stand to Sit 5: Supervision;With upper extremity assist;Without upper extremity assist;With armrests;To chair/3-in-1   Stand Pivot Transfers 5: Supervision;With armrests     Ambulation/Gait   Ambulation/Gait Yes   Ambulation/Gait Assistance 5: Supervision   Ambulation Distance (Feet) 50 Feet  1x23ft, 1x43ft, 1x62ft   Assistive device None   Gait Pattern Step-through pattern;Decreased arm swing - right;Decreased arm swing - left;Decreased stride length;Decreased hip/knee flexion - right;Decreased hip/knee flexion - left;Left flexed knee in stance;Right flexed knee in stance;Trunk flexed;Narrow base of support   Ambulation Surface Level;Indoor   Gait velocity 2.19 ft/sec  Indicates limited community ambulator.     Standardized Balance Assessment   Standardized Balance Assessment Berg Balance Test;Timed Up and Go Test     Berg Balance Test   Sit to Stand Able to stand  independently using hands   Standing Unsupported Able to stand 2 minutes with supervision   Sitting with Back Unsupported but Feet Supported on Floor or Stool Able to sit safely and securely 2 minutes   Stand to Sit Controls descent by using hands   Transfers Able to transfer with verbal cueing and /or supervision   Standing Unsupported with Eyes Closed Able to stand 10 seconds with supervision   Standing Ubsupported with Feet Together Able to place feet together independently and stand for 1 minute with supervision   From Standing, Reach Forward with Outstretched Arm Can reach forward >12 cm safely (5")   From Standing Position, Pick up Object from [place] to pick up shoe, needs supervision   From Standing Position, Turn to Look Behind Over each Shoulder Needs supervision when turning   Turn 360  Degrees Needs close supervision or verbal cueing   Standing Unsupported, Alternately Place Feet on Step/Stool Needs assistance to keep from falling or unable to try   Standing Unsupported, One Foot in Front Able to take small step independently and hold 30 seconds   Standing on One Leg Tries to lift leg/unable to hold 3 seconds but remains standing independently   Total Score 32   Berg comment: Indicates high fall risk     Timed Up and Go Test   TUG Normal TUG   Normal TUG (seconds) 25  Indicates high fall risk.  PT Education - 11/25/15 1049    Education provided Yes   Education Details Pt educated on eval findings, POC, and future prognosis with therapy.  Pt also educated on safety tips to prevent falling at home.   Person(s) Educated Patient;Spouse   Methods Explanation   Comprehension Verbalized understanding          PT Short Term Goals - 11/25/15 1108      PT SHORT TERM GOAL #1   Title Pt will be independent and verbalize understanding of initial HEP to continue progress made in therapy. (Target Date for all STGs: 12/26/15)   Time 4   Period Weeks   Status New     PT SHORT TERM GOAL #2   Title Pt will improve Berg Balance score to > or = 35/56 to indicate an improvement in static balance andincr safety when performing ADLs.   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   Time 4   Period Weeks   Status New     PT SHORT TERM GOAL #4   Title Pt will improve TUG time to < or = 22.00 sec to improve functional mobility.   Time 4   Period Weeks   Status New     PT SHORT TERM GOAL #5   Title Pt will ambulate 243ft indoors over level surfaces, ramps, and curbs with supervision and LRAD to incr functional mobiliyt and incr safety when ambulating at home.   Time  4   Period Weeks   Status New           PT Long Term Goals - 11/25/15 1116      PT LONG TERM GOAL #1   Title Pt will be independent and verbalize understanding of HEP and on-going fitness plan to maintain progress made in therapy and improve overall health. (Target Date for all LTGs: 01/23/16)   Time 8   Period Weeks   Status New     PT LONG TERM GOAL #2   Title Pt will improve Berg Balance score to > or = 40/56 to indicate decr risk of falls.   Time 8   Period Weeks   Status New     PT LONG TERM GOAL #3   Title Pt will improve TUG time to < or = 19 sec to indicate an incr in functional mobility and transfers.   Time 8   Period Weeks   Status New     PT LONG TERM GOAL #4   Title Pt will negotiate 12 stairs with unilat UE support and mod I to incr safety when negotiating stairs at home.   Time 8   Period Weeks   Status New     PT LONG TERM GOAL #5   Title Pt will ambulate 578ft with LRAD and mod I outdoors over unlevel surfaces, pavement, grass, gravel, and ramps to incr safety when working in his yard at home.   Time 8   Period Weeks   Status New               Plan - 11/25/15 1052    Clinical Impression Statement Pt is a 82 year old male who presents to outpatient PT wiht a diagnosis of chronic tremor, weakness fatigue (11/11/15)  and noted balance and gait impairments.  PMH significant for HTN, DJD, tremor, L ankle fx, actinic keratosis, OA, and hypothyroidism.  Pt demonstrates decr static balance as evidence by a Berg Balance score of 32/56 indicating he is a high fall risk.  Pt's gait speed was recorded at 2.19 ft/sec which is indicative of a limited community ambulator.  Also, pt was able to complete the TUG with a time of 25.00 sec indicating a decr in functional mobility and a high risk for falls.  He demonstrated a decr in gait speed and lateral deviations when ambulating with vertical and horizontal head turns and was unable to make a significant change in  speed when asked to walk as fast as possible.  Pt reports no curent pain, however, does report pain when performing sit<>stands and when performing functional movements such as squatting to work on equipment at home.  Overall B LE strength is impaired and is potentially playing a factor in his balance and gait.  Pt's condition appears to be evolving and of moderate complexity.  He would benefit from skilled PT to address his impairments.   Rehab Potential Good   Clinical Impairments Affecting Rehab Potential HTN, OA, L ankle fx, Hard of hearing, DJD   PT Frequency 2x / week   PT Duration 8 weeks   PT Treatment/Interventions ADLs/Self Care Home Management;Functional mobility training;Stair training;Gait training;DME Instruction;Therapeutic activities;Therapeutic exercise;Balance training;Neuromuscular re-education;Patient/family education;Manual techniques;Energy conservation   PT Next Visit Plan [place] HEP, consult about use of AD while on cruise vacation, balance and gait activites   Consulted and Agree with Plan of Care Patient;Family member/caregiver   Family Member Consulted Wife      Patient will benefit from skilled therapeutic intervention in order to improve the following deficits and impairments:  Abnormal gait, Decreased activity tolerance, Decreased balance, Decreased mobility, Decreased knowledge of use of DME, Decreased endurance, Decreased safety awareness, Decreased strength, Impaired perceived functional ability, Impaired flexibility, Postural dysfunction, Improper body mechanics, Pain  Visit Diagnosis: Unsteadiness on feet  Other abnormalities of gait and mobility  Muscle weakness (generalized)     Problem List Patient Active Problem List   Diagnosis Date Noted  . OSA (obstructive sleep apnea) 10/23/2015  . Upper airway cough syndrome 10/23/2015  . Exertional dyspnea 10/23/2015  . RLS (restless legs syndrome) 10/23/2015  . Osteoarthritis of knee 06/04/2010  .  Dizziness 06/04/2010  . Benign hypertensive heart disease without heart failure 06/04/2010  . Hypercholesterolemia 06/04/2010  . BPH (benign prostatic hyperplasia) 06/04/2010    Katherine Mantle, SPT 11/25/2015, 5:06 PM  [facility] [address] [place], [place], [address] Phone: [phone]   Fax:  [phone]  Name: JERIAH MITTON MRN: [id number] Date of Birth: 1928-09-14

## 2015-11-28 ENCOUNTER — Ambulatory Visit: Payer: MEDICARE | Admitting: Physical Therapy

## 2015-11-28 DIAGNOSIS — R2689 Other abnormalities of gait and mobility: Secondary | ICD-10-CM

## 2015-11-28 DIAGNOSIS — R2681 Unsteadiness on feet: Secondary | ICD-10-CM | POA: Diagnosis not present

## 2015-11-28 DIAGNOSIS — M6281 Muscle weakness (generalized): Secondary | ICD-10-CM

## 2015-11-28 NOTE — Therapy (Signed)
[facility] [address] [place], [place], [address] Phone: [phone]   Fax:  [phone]  Physical Therapy Treatment  Patient Details  Name: Randy Sullivan MRN: [id number] Date of Birth: [birth year] Referring Provider: Prince Solian, MD  Encounter Date: 11/28/2015      PT End of Session - 11/28/15 1000    Visit Number 2   Number of Visits 17   Date for PT Re-Evaluation 01/23/16   Authorization Type Medicare [employer]   Authorization Time Period G-Code every 10th visit   PT Start Time 0857   PT Stop Time 0938   PT Time Calculation (min) 41 min   Activity Tolerance Patient tolerated treatment well   Behavior During Therapy [facility] for tasks assessed/performed      Past Medical History:  Diagnosis Date  . BPH (benign prostatic hyperplasia)   . DJD (degenerative joint disease)   . Familial tremor   . GERD (gastroesophageal reflux disease)   . Hyperlipidemia   . Hypertension   . Mood disorder ([place])   . Osteoarthritis   . Prostatism   . Shortness of breath   . Thyroid disease     Past Surgical History:  Procedure Laterality Date  . APPENDECTOMY    . HEMORROIDECTOMY      There were no vitals filed for this visit.      Subjective Assessment - 11/28/15 0859    Subjective A little increase in pain today in B knees and ankles. Pt/wife report upcoming cruise is a river cruise, "so it shouldn't be too unsteady," per pt. Wife reports she has difficulty getting pt to participate in balance exercises on [place]. Wife inquiring about resources for community exercises.   Patient is accompained by: Family member  wife, [name]   Pertinent History Goes by "[name]", Hypothyroidism, HTN, actinic keratosis, DJD, OA, tremor, L ankle fx   Limitations Walking;Standing;House hold activities   Patient Stated Goals "To get back to my normal way of doing things."   Currently in Pain? Yes   Pain Score 6    Pain Location Other  (Comment)  B knees and ankles   Pain Orientation Right;Left   Pain Descriptors / Indicators Aching   Pain Type Chronic pain   Pain Onset More than a month ago   Pain Frequency Intermittent   Aggravating Factors  stepping in an awkward way   Pain Relieving Factors knee and ankle supports   Multiple Pain Sites No                         [facility] Adult PT Treatment/Exercise - 11/28/15 0001      Transfers   Transfers Sit to Stand;Stand to Sit   Sit to Stand 6: Modified independent (Device/Increase time)   Stand to Sit 6: Modified independent (Device/Increase time)   Stand Pivot Transfers 5: Supervision     Ambulation/Gait   Ambulation/Gait Yes   Ambulation/Gait Assistance 5: Supervision   Ambulation/Gait Assistance Details Gait x375' over unlevel, paved surfaces with supervision due to decreased intermittent R forefoot catch, especially when traversing inclined sidewalk. Cueing for reciprocal arm swing.   Ambulation Distance (Feet) 375 Feet   Assistive device None   Gait Pattern Step-through pattern;Decreased arm swing - right;Decreased arm swing - left;Decreased stride length;Decreased hip/knee flexion - right;Decreased hip/knee flexion - left;Left flexed knee in stance;Right flexed knee in stance;Trunk flexed;Narrow base of support;Poor foot clearance - right   Ambulation Surface Unlevel;Outdoor;Paved  Therapeutic Activites    Therapeutic Activities Other Therapeutic Activities   Other Therapeutic Activities Explained, demonstrated how to get onto/off of NuStep, adjust seat positon, and how to modify resistance and length of handles. Pt gave effective return demo, but may need reinforcement in future session.             Balance Exercises - 11/28/15 0915      OTAGO PROGRAM   Head Movements Standing;5 reps  close countertop   Back Extension Standing;5 reps   Trunk Movements Standing;5 reps  close to counter   Knee Flexor 10 reps   Hip ABductor Other reps  (comment)  single UE support   Ankle Plantorflexors 20 reps, support  single UE support   Ankle Dorsiflexors 20 reps, support  BUE support   Knee Bends 10 reps, support   Backwards Walking No support  hand close to coutner   Walking and Turning Around No assistive device   Sideways Walking No assistive device   Tandem Stance 10 seconds, support  finger tip support, as needed   Overall OTAGO Comments During squats, cueing provided for increased hamstring dominance (avoiding knees over toes) to mitigate L knee discomfort.           PT Education - 11/28/15 0954    Education provided Yes   Education Details [employer] initiated. Education on [employer], use of NuStep. Recommended SPC for all mobility outside home.   Person(s) Educated Patient;Spouse   Methods Explanation;Demonstration;Handout;Verbal cues   Comprehension Verbalized understanding;Returned demonstration          PT Short Term Goals - 11/25/15 1108      PT SHORT TERM GOAL #1   Title Pt will be independent and verbalize understanding of initial HEP to continue progress made in therapy. (Target Date for all STGs: 12/26/15)   Time 4   Period Weeks   Status New     PT SHORT TERM GOAL #2   Title Pt will improve Berg Balance score to > or = 35/56 to indicate an improvement in static balance andincr safety when performing ADLs. 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 (Target Date: 12/26/15)   Time 4   Period Weeks   Status New     PT SHORT TERM GOAL #4   Title Pt will improve TUG time to < or = 22.00 sec to improve functional mobility. (Target Date:12/26/15)   Time 4   Period Weeks   Status New     PT SHORT TERM GOAL #5   Title Pt will ambulate 235ft indoors over level surfaces, ramps, and curbs with  supervision and LRAD to incr functional mobiliyt and incr safety when ambulating at home. (Target Date: 12/26/15)   Time 4   Period Weeks   Status New           PT Long Term Goals - 11/25/15 1116      PT LONG TERM GOAL #1   Title Pt will be independent and verbalize understanding of HEP and on-going fitness plan to maintain progress made in therapy and improve overall health. (Target Date for all LTGs: 01/23/16)   Time 8   Period Weeks   Status New     PT  LONG TERM GOAL #2   Title Pt will improve Berg Balance score to > or = 40/56 to indicate decr risk of falls. (Target Date: 01/23/16)   Time 8   Period Weeks   Status New     PT LONG TERM GOAL #3   Title Pt will improve TUG time to < or = 19 sec to indicate an incr in functional mobility and transfers. (01/23/16)   Time 8   Period Weeks   Status New     PT LONG TERM GOAL #4   Title Pt will negotiate 12 stairs with unilat UE support and mod I to incr safety when negotiating stairs at home. (Target Date: 01/23/16)   Time 8   Period Weeks   Status New     PT LONG TERM GOAL #5   Title Pt will ambulate 530ft with LRAD and mod I outdoors over unlevel surfaces, pavement, grass, gravel, and ramps to incr safety when working in his yard at home. (Target Date: 01/23/16)   Time 8   Period Weeks   Status New               Plan - 11/28/15 1002    Clinical Impression Statement Session focused on intiating [employer], assessing community mobility, and on providing education on resources for community fitness and fall prevention. Recommending pt use SPC for all community mobility, as pt required supervision for ambulation over unlevel, paved surfaces during this session.    Rehab Potential Good   Clinical Impairments Affecting Rehab Potential HTN, OA, L ankle fx, Hard of hearing, DJD   PT Frequency 2x / week   PT Duration 8 weeks   PT Treatment/Interventions ADLs/Self Care Home Management;Functional mobility training;Stair  training;Gait training;DME Instruction;Therapeutic activities;Therapeutic exercise;Balance training;Neuromuscular re-education;Patient/family education;Manual techniques;Energy conservation   PT Next Visit Plan [place] education on [place]. Continue standing balance and gait training, LE strengthening.   PT Home Exercise Plan [place] HEP   Consulted and Agree with Plan of Care Patient;Family member/caregiver   Family Member Consulted Wife, [name]      Patient will benefit from skilled therapeutic intervention in order to improve the following deficits and impairments:  Abnormal gait, Decreased activity tolerance, Decreased balance, Decreased mobility, Decreased knowledge of use of DME, Decreased endurance, Decreased safety awareness, Decreased strength, Impaired perceived functional ability, Impaired flexibility, Postural dysfunction, Improper body mechanics, Pain  Visit Diagnosis: Other abnormalities of gait and mobility  Unsteadiness on feet  Muscle weakness (generalized)     Problem List Patient Active Problem List   Diagnosis Date Noted  . OSA (obstructive sleep apnea) 10/23/2015  . Upper airway cough syndrome 10/23/2015  . Exertional dyspnea 10/23/2015  . RLS (restless legs syndrome) 10/23/2015  . Osteoarthritis of knee 06/04/2010  . Dizziness 06/04/2010  . Benign hypertensive heart disease without heart failure 06/04/2010  . Hypercholesterolemia 06/04/2010  . BPH (benign prostatic hyperplasia) 06/04/2010    Billie Ruddy, PT, DPT [facility] [address] [place], [place], [address] Phone: [phone]   Fax:  [phone] 11/28/15, 10:08 AM  Name: Randy Sullivan MRN: [id number] Date of Birth: 1928-12-23

## 2015-12-01 ENCOUNTER — Ambulatory Visit: Payer: MEDICARE | Admitting: Physical Therapy

## 2015-12-01 ENCOUNTER — Encounter: Payer: Self-pay | Admitting: Physical Therapy

## 2015-12-01 VITALS — HR 77

## 2015-12-01 DIAGNOSIS — R2689 Other abnormalities of gait and mobility: Secondary | ICD-10-CM | POA: Diagnosis not present

## 2015-12-01 DIAGNOSIS — R2681 Unsteadiness on feet: Secondary | ICD-10-CM

## 2015-12-01 DIAGNOSIS — M6281 Muscle weakness (generalized): Secondary | ICD-10-CM | POA: Diagnosis not present

## 2015-12-01 NOTE — Therapy (Signed)
Randy Sullivan [address] [place], [place], [address] Phone: [phone]   Fax:  [phone]  Physical Therapy Treatment  Patient Details  Name: Randy Sullivan MRN: [id number] Date of Birth: 10/29/1928 Referring Provider: Prince Solian, MD  Encounter Date: 12/01/2015      PT End of Session - 12/01/15 0929    Visit Number (P)  3   Number of Visits (P)  17   Date for PT Re-Evaluation (P)  01/23/16   Authorization Type (P)  [employer]   Authorization Time Period (P)  G-Code every 10th visit   PT Start Time (P)  0847   PT Stop Time (P)  0930   PT Time Calculation (min) (P)  43 min   Activity Tolerance (P)  Patient tolerated treatment well   Behavior During Therapy (P)  WFL for tasks assessed/performed      Past Medical History:  Diagnosis Date  . BPH (benign prostatic hyperplasia)   . DJD (degenerative joint disease)   . Familial tremor   . GERD (gastroesophageal reflux disease)   . Hyperlipidemia   . Hypertension   . Mood disorder ([place])   . Osteoarthritis   . Prostatism   . Shortness of breath   . Thyroid disease     Past Surgical History:  Procedure Laterality Date  . APPENDECTOMY    . HEMORROIDECTOMY      Vitals:   12/01/15 0852 12/01/15 0914  Pulse: 70 beginning of session 77 after nustep  SpO2: 95% 97%        Subjective Assessment - 12/01/15 0848    Subjective Pt wasn't feeling good over the weekend, "Washed out."    Patient is accompained by: Family member  wife, Randy Sullivan   Pertinent History Goes by "Randy Sullivan", Hypothyroidism, HTN, actinic keratosis, DJD, OA, tremor, L ankle fx   Limitations Walking;Standing;House hold activities   Patient Stated Goals "To get back to my normal way of doing things."   Currently in Pain? Yes   Pain Score 6    Pain Location Other (Comment)  both ankles and knees   Pain Orientation Right;Left   Pain Descriptors / Indicators Aching   Pain Type Chronic  pain   Pain Onset More than a month ago   Pain Frequency Intermittent   Multiple Pain Sites Yes   Pain Score 4   Pain Location Shoulder   Pain Orientation Right   Pain Descriptors / Indicators Aching;Sharp   Pain Type Acute pain   Pain Onset In the past 7 days   Pain Frequency Intermittent                         [facility] Adult PT Treatment/Exercise - 12/01/15 0001      Knee/Hip Exercises: Aerobic   Nustep Level 2, 10 min, 56 steps/min             Balance Exercises - 12/01/15 0914      [place] PROGRAM   Tandem Stance 10 seconds, support   Tandem Walk Support   One Leg Stand 10 seconds, support   Heel Walking Support   Toe Walk Support           PT Education - 12/01/15 0902    Education provided Yes   Education Details Pt has bike at home; encouraged pt to use bike 2-3x/week working towards a consistent 10 min/ session and slowly increase.  Benefits of aerobic activity for pulmonary and cardiovascular  health. Added exercises from [facility] for HEP.   Person(s) Educated Patient;Spouse   Methods Explanation;Demonstration;Verbal cues;Handout   Comprehension Verbalized understanding;Returned demonstration;Need further instruction          PT Short Term Goals - 11/25/15 1108      PT SHORT TERM GOAL #1   Title Pt will be independent and verbalize understanding of initial HEP to continue progress made in therapy. (Target Date for all STGs: 12/26/15)   Time 4   Period Weeks   Status New     PT SHORT TERM GOAL #2   Title Pt will improve Berg Balance score to > or = 35/56 to indicate an improvement in static balance andincr safety when performing ADLs. 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 (Target Date: 12/26/15)   Time 4   Period Weeks   Status New      PT SHORT TERM GOAL #4   Title Pt will improve TUG time to < or = 22.00 sec to improve functional mobility. (Target Date:12/26/15)   Time 4   Period Weeks   Status New     PT SHORT TERM GOAL #5   Title Pt will ambulate 231ft indoors over level surfaces, ramps, and curbs with supervision and LRAD to incr functional mobiliyt and incr safety when ambulating at home. (Target Date: 12/26/15)   Time 4   Period Weeks   Status New           PT Long Term Goals - 11/25/15 1116      PT LONG TERM GOAL #1   Title Pt will be independent and verbalize understanding of HEP and on-going fitness plan to maintain progress made in therapy and improve overall health. (Target Date for all LTGs: 01/23/16)   Time 8   Period Weeks   Status New     PT LONG TERM GOAL #2   Title Pt will improve Berg Balance score to > or = 40/56 to indicate decr risk of falls. (Target Date: 01/23/16)   Time 8   Period Weeks   Status New     PT LONG TERM GOAL #3   Title Pt will improve TUG time to < or = 19 sec to indicate an incr in functional mobility and transfers. (01/23/16)   Time 8   Period Weeks   Status New     PT LONG TERM GOAL #4   Title Pt will negotiate 12 stairs with unilat UE support and mod I to incr safety when negotiating stairs at home. (Target Date: 01/23/16)   Time 8   Period Weeks   Status New     PT LONG TERM GOAL #5   Title Pt will ambulate 558ft with LRAD and mod I outdoors over unlevel surfaces, pavement, grass, gravel, and ramps to incr safety when working in his yard at home. (Target Date: 01/23/16)   Time 8   Period Weeks   Status New               Plan - 12/01/15 0908    Clinical Impression Statement Pt performed 10 min on nustep continuously with good tolerance at 56 steps/min.  Progressed [place]; pt needing intermittent UE support and cues for technqiue.  Rehab Potential Good   Clinical Impairments Affecting Rehab Potential HTN, OA, L ankle fx, Hard of hearing, DJD   PT  Frequency 2x / week   PT Duration 8 weeks   PT Treatment/Interventions ADLs/Self Care Home Management;Functional mobility training;Stair training;Gait training;DME Instruction;Therapeutic activities;Therapeutic exercise;Balance training;Neuromuscular re-education;Patient/family education;Manual techniques;Energy conservation   PT Next Visit Plan [place] education on [place]. Continue standing balance and gait training, LE strengthening.   PT Home Exercise Plan [place] HEP   Consulted and Agree with Plan of Care Patient;Family member/caregiver   Family Member Consulted Wife, Randy Sullivan      Patient will benefit from skilled therapeutic intervention in order to improve the following deficits and impairments:  Abnormal gait, Decreased activity tolerance, Decreased balance, Decreased mobility, Decreased knowledge of use of DME, Decreased endurance, Decreased safety awareness, Decreased strength, Impaired perceived functional ability, Impaired flexibility, Postural dysfunction, Improper body mechanics, Pain  Visit Diagnosis: Other abnormalities of gait and mobility  Unsteadiness on feet  Muscle weakness (generalized)     Problem List Patient Active Problem List   Diagnosis Date Noted  . OSA (obstructive sleep apnea) 10/23/2015  . Upper airway cough syndrome 10/23/2015  . Exertional dyspnea 10/23/2015  . RLS (restless legs syndrome) 10/23/2015  . Osteoarthritis of knee 06/04/2010  . Dizziness 06/04/2010  . Benign hypertensive heart disease without heart failure 06/04/2010  . Hypercholesterolemia 06/04/2010  . BPH (benign prostatic hyperplasia) 06/04/2010    Randy Sullivan, PTA  12/01/15, 10:18 AM [facility] [address] [place], [place], [address] Phone: [phone]   Fax:  [phone]  Name: Randy Sullivan MRN: [id number] Date of Birth: 1928-06-21

## 2015-12-03 ENCOUNTER — Ambulatory Visit: Payer: MEDICARE | Admitting: Physical Therapy

## 2015-12-03 ENCOUNTER — Ambulatory Visit (HOSPITAL_COMMUNITY)
Admission: RE | Admit: 2015-12-03 | Discharge: 2015-12-03 | Disposition: A | Discharge status: Home / Self Care | Payer: MEDICARE | Source: Ambulatory Visit | Attending: Pulmonary Disease | Admitting: Pulmonary Disease

## 2015-12-03 ENCOUNTER — Encounter: Payer: Self-pay | Admitting: Adult Health

## 2015-12-03 ENCOUNTER — Ambulatory Visit (INDEPENDENT_AMBULATORY_CARE_PROVIDER_SITE_OTHER): Payer: MEDICARE | Admitting: Adult Health

## 2015-12-03 VITALS — BP 122/76 | HR 73 | Temp 98.0°F | Ht 72.0 in | Wt 181.0 lb

## 2015-12-03 DIAGNOSIS — R2681 Unsteadiness on feet: Secondary | ICD-10-CM | POA: Diagnosis not present

## 2015-12-03 DIAGNOSIS — G4733 Obstructive sleep apnea (adult) (pediatric): Secondary | ICD-10-CM | POA: Diagnosis not present

## 2015-12-03 DIAGNOSIS — J449 Chronic obstructive pulmonary disease, unspecified: Secondary | ICD-10-CM | POA: Diagnosis not present

## 2015-12-03 DIAGNOSIS — R2689 Other abnormalities of gait and mobility: Secondary | ICD-10-CM | POA: Diagnosis not present

## 2015-12-03 DIAGNOSIS — R0600 Dyspnea, unspecified: Secondary | ICD-10-CM | POA: Diagnosis not present

## 2015-12-03 DIAGNOSIS — R0609 Other forms of dyspnea: Secondary | ICD-10-CM

## 2015-12-03 DIAGNOSIS — M6281 Muscle weakness (generalized): Secondary | ICD-10-CM | POA: Diagnosis not present

## 2015-12-03 LAB — PULMONARY FUNCTION TEST
DL/VA % pred: 72 %
DL/VA: 3.37 ml/min/mmHg/L
DLCO unc % pred: 58 %
DLCO unc: 20.36 ml/min/mmHg
FEF 25-75 Post: 2.3 L/s
FEF 25-75 Pre: 1.53 L/s
FEF2575-%Change-Post: 51 %
FEF2575-%Pred-Post: 121 %
FEF2575-%Pred-Pre: 80 %
FEV1-%Change-Post: 8 %
FEV1-%Pred-Post: 98 %
FEV1-%Pred-Pre: 90 %
FEV1-Post: 2.86 L
FEV1-Pre: 2.63 L
FEV1FVC-%Change-Post: 2 %
FEV1FVC-%Pred-Pre: 100 %
FEV6-%Change-Post: 6 %
FEV6-%Pred-Post: 98 %
FEV6-%Pred-Pre: 92 %
FEV6-Post: 3.8 L
FEV6-Pre: 3.56 L
FEV6FVC-%Change-Post: 0 %
FEV6FVC-%Pred-Post: 102 %
FEV6FVC-%Pred-Pre: 102 %
FVC-%Change-Post: 6 %
FVC-%Pred-Post: 96 %
FVC-%Pred-Pre: 90 %
FVC-Post: 3.99 L
FVC-Pre: 3.74 L
Post FEV1/FVC ratio: 72 %
Post FEV6/FVC ratio: 95 %
Pre FEV1/FVC ratio: 70 %
Pre FEV6/FVC Ratio: 95 %
RV % pred: 86 %
RV: 2.5 L
TLC % pred: 84 %
TLC: 6.3 L

## 2015-12-03 MED ORDER — FLUTICASONE FUROATE-VILANTEROL 100-25 MCG/INH IN AEPB: 1.0000 | INHALATION_SPRAY | Freq: Every day | RESPIRATORY_TRACT | 5 refills | Status: DC

## 2015-12-03 MED ORDER — ALBUTEROL SULFATE (2.5 MG/3ML) 0.083% IN NEBU
2.5000 mg | INHALATION_SOLUTION | Freq: Once | RESPIRATORY_TRACT | Status: AC
  Administered 2015-12-03: 2.5 mg via RESPIRATORY_TRACT

## 2015-12-03 NOTE — Therapy (Signed)
[facility] [address] [place], [place], [address] Phone: [phone]   Fax:  [phone]  Physical Therapy Treatment  Patient Details  Name: Randy Sullivan MRN: [id number] Date of Birth: [birth year] Referring Provider: Prince Solian, MD  Encounter Date: 12/03/2015      PT End of Session - 12/03/15 1730    Visit Number 4   Number of Visits 17   Date for PT Re-Evaluation 01/23/16   Authorization Type [employer]   Authorization Time Period G-Code every 10th visit   PT Start Time 1403   PT Stop Time 1447   PT Time Calculation (min) 44 min      Past Medical History:  Diagnosis Date  . BPH (benign prostatic hyperplasia)   . DJD (degenerative joint disease)   . Familial tremor   . GERD (gastroesophageal reflux disease)   . Hyperlipidemia   . Hypertension   . Mood disorder ([place])   . Osteoarthritis   . Prostatism   . Shortness of breath   . Thyroid disease     Past Surgical History:  Procedure Laterality Date  . APPENDECTOMY    . HEMORROIDECTOMY      There were no vitals filed for this visit.      Subjective Assessment - 12/03/15 1719    Subjective Pt states he forgot to bring [place] exercise booklet to therapy - had doctor's appointments earlier today and has been on the go all day   Patient is accompained by: Family member   Pertinent History Goes by "Randy Sullivan", Hypothyroidism, HTN, actinic keratosis, DJD, OA, tremor, L ankle fx   Limitations Walking;Standing;House hold activities   Patient Stated Goals "To get back to my normal way of doing things."   Currently in Pain? Yes   Pain Score --  does not rate intensity - says it is mild   Pain Location Ankle   Pain Orientation Right   Pain Descriptors / Indicators Sore   Pain Type Chronic pain   Pain Onset More than a month ago   Pain Frequency Intermittent   Multiple Pain Sites No                         [facility] Adult PT  Treatment/Exercise - 12/03/15 1406      Transfers   Transfers Sit to Stand;Stand to Sit   Sit to Stand 5: Supervision   Stand Pivot Transfers 5: Supervision   Comments no UE suppport used             Balance Exercises - 12/03/15 1726      Balance Exercises: Standing   Standing Eyes Opened Head turns;Wide (BOA);Other (comment)  on rockerboard   Standing Eyes Closed Foam/compliant surface;Wide (BOA);Other (comment)  on blue [place]   SLS Eyes open;Intermittent upper extremity support;5 reps;Solid surface  touching balance bubbles and cone taps by counter with UE su   Rockerboard Anterior/posterior;Head turns;EO;30 seconds;Intermittent UE support   Other Standing Exercises Ladder on floor - step over step sequence with min hand held assist  4 reps total     Stepping over and back of balance beam - with UE support prn with SBA - 5 reps each leg Alternate tap ups to 1st step, 10 reps each;  To 2nd step 10 reps each with UE support prn  Standing theraband (red) kicks - bil. LE's - hip extension and abduction 10 reps each leg       PT  Short Term Goals - 11/25/15 1108      PT SHORT TERM GOAL #1   Title Pt will be independent and verbalize understanding of initial HEP to continue progress made in therapy. (Target Date for all STGs: 12/26/15)   Time 4   Period Weeks   Status New     PT SHORT TERM GOAL #2   Title Pt will improve Berg Balance score to > or = 35/56 to indicate an improvement in static balance andincr safety when performing ADLs. 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 (Target Date: 12/26/15)   Time 4   Period Weeks   Status New     PT SHORT TERM GOAL #4   Title Pt will improve TUG time to < or = 22.00 sec to improve functional mobility. (Target Date:12/26/15)    Time 4   Period Weeks   Status New     PT SHORT TERM GOAL #5   Title Pt will ambulate 280ft indoors over level surfaces, ramps, and curbs with supervision and LRAD to incr functional mobiliyt and incr safety when ambulating at home. (Target Date: 12/26/15)   Time 4   Period Weeks   Status New           PT Long Term Goals - 11/25/15 1116      PT LONG TERM GOAL #1   Title Pt will be independent and verbalize understanding of HEP and on-going fitness plan to maintain progress made in therapy and improve overall health. (Target Date for all LTGs: 01/23/16)   Time 8   Period Weeks   Status New     PT LONG TERM GOAL #2   Title Pt will improve Berg Balance score to > or = 40/56 to indicate decr risk of falls. (Target Date: 01/23/16)   Time 8   Period Weeks   Status New     PT LONG TERM GOAL #3   Title Pt will improve TUG time to < or = 19 sec to indicate an incr in functional mobility and transfers. (01/23/16)   Time 8   Period Weeks   Status New     PT LONG TERM GOAL #4   Title Pt will negotiate 12 stairs with unilat UE support and mod I to incr safety when negotiating stairs at home. (Target Date: 01/23/16)   Time 8   Period Weeks   Status New     PT LONG TERM GOAL #5   Title Pt will ambulate 567ft with LRAD and mod I outdoors over unlevel surfaces, pavement, grass, gravel, and ramps to incr safety when working in his yard at home. (Target Date: 01/23/16)   Time 8   Period Weeks   Status New               Plan - 12/03/15 1736    Clinical Impression Statement Pt demonstrating decreased high level balance skills - specifically decr. single limb stance; pt needed only minimal UE support with balance activities   Rehab Potential Good   PT Frequency 2x / week   PT Duration 8 weeks   PT Treatment/Interventions ADLs/Self Care Home Management;Functional mobility training;Stair training;Gait training;DME Instruction;Therapeutic activities;Therapeutic exercise;Balance  training;Neuromuscular re-education;Patient/family education;Manual techniques;Energy conservation  PT Next Visit Plan finish [place] (?) as pt did not bring booklet on 12-03-15; cont high level balance and gait training   PT Home Exercise Plan [place] and Agree with Plan of Care Patient;Family member/caregiver   Family Member Consulted Wife, Randy Sullivan      Patient will benefit from skilled therapeutic intervention in order to improve the following deficits and impairments:  Abnormal gait, Decreased activity tolerance, Decreased balance, Decreased mobility, Decreased knowledge of use of DME, Decreased endurance, Decreased safety awareness, Decreased strength, Impaired perceived functional ability, Impaired flexibility, Postural dysfunction, Improper body mechanics, Pain  Visit Diagnosis: Other abnormalities of gait and mobility  Unsteadiness on feet     Problem List Patient Active Problem List   Diagnosis Date Noted  . OSA (obstructive sleep apnea) 10/23/2015  . Upper airway cough syndrome 10/23/2015  . Exertional dyspnea 10/23/2015  . RLS (restless legs syndrome) 10/23/2015  . Osteoarthritis of knee 06/04/2010  . Dizziness 06/04/2010  . Benign hypertensive heart disease without heart failure 06/04/2010  . Hypercholesterolemia 06/04/2010  . BPH (benign prostatic hyperplasia) 06/04/2010    Randy Sullivan, [place], PT 12/03/2015, 5:44 PM  [facility] [address] [name], [place], [address] Phone: [phone]   Fax:  [phone]  Name: Randy Sullivan MRN: [id number] Date of Birth: 1928/05/26

## 2015-12-03 NOTE — Assessment & Plan Note (Signed)
Pt is former smoker , PFT show preserved lung function with minimal dz if any . It is possible component of asthma with reversibility in mid flows on PFT although he has very little sx .  For now stay on BREO , if on return no changes. Would consider stopping.  DLCO is decreased with no exertional desats on RA. Previous CT chest did show some COPD changes.   Plan  Patient Instructions  Continue on CPAP At bedtime   Set up for ONO on CPAP on room air.  Continue on BREO daily , rinse after use.  Have fun on your trip . Follow up Dr. Corrie Dandy in 6 weeks and As needed

## 2015-12-03 NOTE — Patient Instructions (Signed)
Continue on CPAP At bedtime   Set up for ONO on CPAP on room air.  Continue on BREO daily , rinse after use.  Have fun on your trip . Follow up Dr. Corrie Dandy in 6 weeks and As needed

## 2015-12-03 NOTE — Progress Notes (Signed)
Subjective:    Patient ID: Randy Sullivan, male    DOB: 01/09/1929, 82 y.o.   MRN: [id number]  HPI 81 yo former smoker seen for sleep consult  09/3015 .   TEST  PSG 07/31/15 AHI 7.5  PFT FEV1 98%, ratio 72, no sign BD response , FVC 90%. DLCO 58%, +reversibility in mid flows .   12/03/2015 Follow up : OSA and PFT Pt returns for 2 month follow up for sleep apnea. He was seen last for sleep consult for daytime sleepiness . Sleep study in June 2017 showed mild OSA  With AHI 7.5. He was started on CPAP He received his CPAP 2 weeks ago. Says he is doing okay on it. Wife says he is less tired during daytime.  We discussed how to tighten head straps and his nasal pillows sometimes move around causing leaks.   He is a former smoker with no previous dx of asthma or copd. He did have some dyspnea, and cough. Prior to sleep study was started on O2 for nocturnal desats. Now he is on O2 with CPAP . No ONO on CPAP has been done.  We discussed he may not need O2 now that he is on CPAP .  PFT were done today that showed minimal COPD if any. FEV1 98%, ratio 72, no sign BD response , FVC 90%. DLCO 58%, +reversibility in mid flows .  He was started on BREO last ov , he says it is hard to tell if helps at all.  No wheezing . Occasional dyspnea but very minimal.  Going on cruise next week .    Review of Systems  Constitutional:   No  weight loss, night sweats,  Fevers, chills, fatigue, or  lassitude.  HEENT:   No headaches,  Difficulty swallowing,  Tooth/dental problems, or  Sore throat,                No sneezing, itching, ear ache, nasal congestion, post nasal drip,   CV:  No chest pain,  Orthopnea, PND, swelling in lower extremities, anasarca, dizziness, palpitations, syncope.   GI  No heartburn, indigestion, abdominal pain, nausea, vomiting, diarrhea, change in bowel habits, loss of appetite, bloody stools.   Resp: No shortness of breath with exertion or at rest.  No excess mucus, no productive cough,   No non-productive cough,  No coughing up of blood.  No change in color of mucus.  No wheezing.  No chest wall deformity  Skin: no rash or lesions.  GU: no dysuria, change in color of urine, no urgency or frequency.  No flank pain, no hematuria   MS:  No joint pain or swelling.  No decreased range of motion.  No back pain.  Psych:  No change in mood or affect. No depression or anxiety.  No memory loss.          Objective:   Physical Exam Vitals:   12/03/15 1138  BP: 122/76  Pulse: 73  Temp: 98 F (36.7 C)  TempSrc: Oral  SpO2: 98%  Weight: 181 lb (82.1 kg)  Height: 6' (1.829 m)   GEN: A/Ox3; pleasant , NAD, well nourished    HEENT:  [place]/AT,  EACs-clear, TMs-wnl, NOSE-clear, THROAT-clear, no lesions, no postnasal drip or exudate noted.   NECK:  Supple w/ fair ROM; no JVD; normal carotid impulses w/o bruits; no thyromegaly or nodules palpated; no lymphadenopathy.    RESP  Clear  P & A; w/o, wheezes/ rales/ or rhonchi. no  accessory muscle use, no dullness to percussion  CARD:  RRR, no m/r/g  , no peripheral edema, pulses intact, no cyanosis or clubbing.  GI:   Soft & nt; nml bowel sounds; no organomegaly or masses detected.   Musco: Warm bil, no deformities or joint swelling noted.   Neuro: alert, no focal deficits noted.    Skin: Warm, no lesions or rashes  Tammy Parrett NP-C  [facility]  12/03/2015

## 2015-12-03 NOTE — Assessment & Plan Note (Signed)
Doing well on CPAP as he has just started  Will cont current regimen  Check ONO on CPAP on RA , if ok, d/c O2.  follow up 6 weeks with download.

## 2015-12-04 DIAGNOSIS — R300 Dysuria: Secondary | ICD-10-CM | POA: Diagnosis not present

## 2015-12-04 DIAGNOSIS — R358 Other polyuria: Secondary | ICD-10-CM | POA: Diagnosis not present

## 2015-12-08 ENCOUNTER — Ambulatory Visit: Payer: MEDICARE | Admitting: Neurology

## 2015-12-08 NOTE — Progress Notes (Signed)
LMTCB

## 2015-12-17 ENCOUNTER — Ambulatory Visit: Payer: MEDICARE | Admitting: Physical Therapy

## 2015-12-17 ENCOUNTER — Encounter: Payer: Self-pay | Admitting: Physical Therapy

## 2015-12-17 DIAGNOSIS — R2681 Unsteadiness on feet: Secondary | ICD-10-CM

## 2015-12-17 DIAGNOSIS — R2689 Other abnormalities of gait and mobility: Secondary | ICD-10-CM

## 2015-12-17 DIAGNOSIS — M6281 Muscle weakness (generalized): Secondary | ICD-10-CM

## 2015-12-17 NOTE — Therapy (Signed)
[facility] [address] [place], [place], [address] Phone: [phone]   Fax:  [phone]  Physical Therapy Treatment  Patient Details  Name: Randy Sullivan MRN: [id number] Date of Birth: 10-14-1928 Referring Provider: Prince Solian, MD  Encounter Date: 12/17/2015      PT End of Session - 12/17/15 1209    Visit Number 5   Number of Visits 17   Date for PT Re-Evaluation 01/23/16   Authorization Type [employer]   Authorization Time Period G-Code every 10th visit   PT Start Time 1106   PT Stop Time 1149   PT Time Calculation (min) 43 min   Equipment Utilized During Treatment Gait belt   Activity Tolerance Patient tolerated treatment well   Behavior During Therapy WFL for tasks assessed/performed      Past Medical History:  Diagnosis Date  . BPH (benign prostatic hyperplasia)   . DJD (degenerative joint disease)   . Familial tremor   . GERD (gastroesophageal reflux disease)   . Hyperlipidemia   . Hypertension   . Mood disorder ([place])   . Osteoarthritis   . Prostatism   . Shortness of breath   . Thyroid disease     Past Surgical History:  Procedure Laterality Date  . APPENDECTOMY    . HEMORROIDECTOMY      There were no vitals filed for this visit.      Subjective Assessment - 12/17/15 1114    Subjective Pt reports going on a cruise last week and having no major issues or falls.  He states he did not bring any AD and felt very stable thorughout.  During trips on land, he reports some pain in B knees due to long bouts of walking.   Patient is accompained by: Family member   Pertinent History Goes by "Randy Sullivan", Hypothyroidism, HTN, actinic keratosis, DJD, OA, tremor, L ankle fx   Limitations Walking;Standing;House hold activities   Patient Stated Goals "To get back to my normal way of doing things."   Currently in Pain? Yes   Pain Score 4    Pain Location Knee   Pain Orientation Right;Left    Pain Descriptors / Indicators Sore   Pain Type Chronic pain   Pain Onset More than a month ago   Pain Frequency Intermittent   Multiple Pain Sites No   Pain Onset In the past 7 days                         [facility] Adult PT Treatment/Exercise - 12/17/15 1100      Transfers   Transfers Sit to Stand;Stand to [employer] Transfers   Sit to Stand 5: Supervision;Without upper extremity assist;From chair/3-in-1   Sit to Stand Details (indicate cue type and reason) Verbal cuing to incr forward trunk flex to incr control during sitting   Stand to Sit 5: Supervision;Without upper extremity assist;To chair/3-in-1   Stand to Sit Details See above   Stand Pivot Transfers 5: Supervision     Ambulation/Gait   Ambulation/Gait Yes   Ambulation/Gait Assistance 5: Supervision   Ambulation/Gait Assistance Details Verbal cues to incr step length and gait speed to improve dynamic balance when walking.  Pt demonstrated improved arm swing bilaterally.   Ambulation Distance (Feet) 550 Feet  1x550', 2x100', 2x30'   Assistive device None   Gait Pattern Step-through pattern;Decreased stride length;Decreased hip/knee flexion - right;Decreased hip/knee flexion - left;Decreased dorsiflexion - right;Decreased dorsiflexion - left;Trunk flexed;Narrow  base of support;Poor foot clearance - left;Poor foot clearance - right   Ambulation Surface Level;Unlevel;Indoor;Outdoor;Paved   Stairs Yes   Stairs Assistance 5: Supervision   Stairs Assistance Details (indicate cue type and reason) Verbal cuing to attempt reciprocal stepping pattern when ascending and descending to analyze pt's stepping ability for OTAGO exercise.   Stair Management Technique One rail Right;One rail Left;Alternating pattern;Step to pattern  R rail and step to ascending; L rail and recip descsending   Number of Stairs 8   Height of Stairs 6   Ramp 5: Supervision   Curb 5: Supervision     Neuro Re-ed    Neuro Re-ed Details  Pt  performed sit<>stands with Airex pad under feet to incr B LE strength and improve balance.  Verbal cuing for sequencing and to fully ext spine and knees when standing.  Pt also cued to incr forward trunk flex to improve sitting control.             Balance Exercises - 12/17/15 1139      Balance Exercises: Standing   Step Over Hurdles / Cones 4 laps each of forward and side stepping over hurdles to incr dynamic balance.  Verbal and demo cuing needed for sequencing, proper form, and for pt to maintain balance before attempting to step over next hurdle.  Pt reuired minA with activity for minor LOB.     [place] PROGRAM   Sit to Stand --  5 reps no support   Stair Walking 8 stairs with supervision and reciprocal stepping pattern; single UE on rail           PT Education - 12/17/15 1208    Education provided Yes   Education Details Pt educated on final OTAGO exercises and how incr walking speed can improve balance.   Person(s) Educated Patient;Spouse   Methods Explanation;Demonstration;Verbal cues;Handout   Comprehension Verbalized understanding;Returned demonstration          PT Short Term Goals - 11/25/15 1108      PT SHORT TERM GOAL #1   Title Pt will be independent and verbalize understanding of initial HEP to continue progress made in therapy. (Target Date for all STGs: 12/26/15)   Time 4   Period Weeks   Status New     PT SHORT TERM GOAL #2   Title Pt will improve Berg Balance score to > or = 35/56 to indicate an improvement in static balance andincr safety when performing ADLs. 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 (Target Date: 12/26/15)   Time 4   Period Weeks   Status New     PT SHORT TERM GOAL #4   Title Pt will improve TUG time to < or = 22.00 sec to improve  functional mobility. (Target Date:12/26/15)   Time 4   Period Weeks   Status New     PT SHORT TERM GOAL #5   Title Pt will ambulate 219ft indoors over level surfaces, ramps, and curbs with supervision and LRAD to incr functional mobiliyt and incr safety when ambulating at home. (Target Date: 12/26/15)   Time 4   Period Weeks   Status New           PT Long Term  Goals - 11/25/15 1116      PT LONG TERM GOAL #1   Title Pt will be independent and verbalize understanding of HEP and on-going fitness plan to maintain progress made in therapy and improve overall health. (Target Date for all LTGs: 01/23/16)   Time 8   Period Weeks   Status New     PT LONG TERM GOAL #2   Title Pt will improve Berg Balance score to > or = 40/56 to indicate decr risk of falls. (Target Date: 01/23/16)   Time 8   Period Weeks   Status New     PT LONG TERM GOAL #3   Title Pt will improve TUG time to < or = 19 sec to indicate an incr in functional mobility and transfers. (01/23/16)   Time 8   Period Weeks   Status New     PT LONG TERM GOAL #4   Title Pt will negotiate 12 stairs with unilat UE support and mod I to incr safety when negotiating stairs at home. (Target Date: 01/23/16)   Time 8   Period Weeks   Status New     PT LONG TERM GOAL #5   Title Pt will ambulate 558ft with LRAD and mod I outdoors over unlevel surfaces, pavement, grass, gravel, and ramps to incr safety when working in his yard at home. (Target Date: 01/23/16)   Time 8   Period Weeks   Status New               Plan - 12/17/15 1209    Clinical Impression Statement Pt was able to tolerate higher level balance activities and demonstrated improved gait quality when ambualting outdoors.  He continues to possess deficits with overall balance, particularly dynamic balance especially with gait activities.  He has made good progress toward his goals and would benefit from skilled PT to address his impairments.   Rehab Potential  Good   Clinical Impairments Affecting Rehab Potential HTN, OA, L ankle fx, Hard of hearing, DJD   PT Frequency 2x / week   PT Duration 8 weeks   PT Treatment/Interventions ADLs/Self Care Home Management;Functional mobility training;Stair training;Gait training;DME Instruction;Therapeutic activities;Therapeutic exercise;Balance training;Neuromuscular re-education;Patient/family education;Manual techniques;Energy conservation   PT Next Visit Plan cont high level balance and gait training   PT Home Exercise Plan [place] [facility]   Consulted and Agree with Plan of Care Patient;Family member/caregiver   Family Member Consulted Wife, [name]      Patient will benefit from skilled therapeutic intervention in order to improve the following deficits and impairments:  Abnormal gait, Decreased activity tolerance, Decreased balance, Decreased mobility, Decreased knowledge of use of DME, Decreased endurance, Decreased safety awareness, Decreased strength, Impaired perceived functional ability, Impaired flexibility, Postural dysfunction, Improper body mechanics, Pain  Visit Diagnosis: Unsteadiness on feet  Other abnormalities of gait and mobility  Muscle weakness (generalized)     Problem List Patient Active Problem List   Diagnosis Date Noted  . OSA (obstructive sleep apnea) 10/23/2015  . Upper airway cough syndrome 10/23/2015  . Exertional dyspnea 10/23/2015  . RLS (restless legs syndrome) 10/23/2015  . Osteoarthritis of knee 06/04/2010  . Dizziness 06/04/2010  . Benign hypertensive heart disease without heart failure 06/04/2010  . Hypercholesterolemia 06/04/2010  . BPH (benign prostatic hyperplasia) 06/04/2010    Katherine Mantle, SPT 12/17/2015, 12:13 PM  [facility] [address] [place], [place], [address] Phone: [phone]   Fax:  [phone]  Name: Randy Sullivan  MRN: [id number] Date of Birth: November 25, 1928

## 2015-12-18 ENCOUNTER — Ambulatory Visit (INDEPENDENT_AMBULATORY_CARE_PROVIDER_SITE_OTHER): Payer: MEDICARE | Admitting: Neurology

## 2015-12-18 ENCOUNTER — Encounter: Payer: Self-pay | Admitting: Neurology

## 2015-12-18 VITALS — BP 183/87 | HR 66 | Resp 16 | Ht 72.0 in | Wt 186.0 lb

## 2015-12-18 DIAGNOSIS — G479 Sleep disorder, unspecified: Secondary | ICD-10-CM

## 2015-12-18 DIAGNOSIS — G4733 Obstructive sleep apnea (adult) (pediatric): Secondary | ICD-10-CM

## 2015-12-18 DIAGNOSIS — N3941 Urge incontinence: Secondary | ICD-10-CM | POA: Diagnosis not present

## 2015-12-18 DIAGNOSIS — R350 Frequency of micturition: Secondary | ICD-10-CM | POA: Diagnosis not present

## 2015-12-18 DIAGNOSIS — G4761 Periodic limb movement disorder: Secondary | ICD-10-CM | POA: Diagnosis not present

## 2015-12-18 MED ORDER — GABAPENTIN 100 MG PO CAPS: 200.0000 mg | ORAL_CAPSULE | Freq: Every day | ORAL | 3 refills | Status: DC

## 2015-12-18 NOTE — Progress Notes (Signed)
Subjective:    Patient ID: Randy Sullivan is a 82 y.o. male.  HPI    Interim history:   Randy Sullivan is a 82 year old right-handed gentleman with an underlying medical history of hypothyroidism, hypertension, reflux disease, BPH, actinic keratosis, degenerative joint disease, particularly of the knees, tremor, seasonal allergies, left ankle fracture, who Presents for follow-up consultation of his sleep disorder, in particular PLMD. The patient is accompanied by his wife today. I last saw him on 08/11/2015, at which time we talked about his sleep study results from 07/30/2015, indicating mild obstructive sleep apnea and severe PLMS. I suggested symptomatic treatment for his PLMD with gabapentin low-dose. We also talked about potentially trying CPAP therapy for his overall mild sleep apnea with an AHI of 7.5 per hour, worse in supine sleep. His wife had noted leg twitching in the past 2 or 3 months. His nocturia was about the same. He was on home oxygen. He denies restless leg symptoms but did report discomfort in his left knee and left ankle.   Today, 12/18/2015: He reports doing better. He feels that he is less restless on most nights area at some nights are worse than others. In addition, he has had more knee pain bilaterally in both ankles hurt as well. He has previously seen Dr. Maureen Sullivan for this and has had injections into his left knee. They recently went on a cruise and he had a lot more walking to do and feels that this may have exacerbated his pain. He has a cane but does not typically use it. His wife believes that he is less restless at night and leg movements or less. He goes to urology regularly. He is getting treatment for hyperactive bladder. He's also on medication for this, he has nocturia about 2-4 times per average night.  Previously:   I first met him on 07/09/2015 at the request of his primary care physician, at which time the patient reported snoring, nonrestorative sleep, significant  nocturia and daytime tiredness. He had recently undergone an overnight pulse oximetry test of which the results were pending at the time. In the interim, I reviewed his test results on 07/29/2015: Pulse oximetry overnight was done on 07/04/2015. Average oxygen saturation was 94.7%, total test time 8 hours and 9 minutes. Lowest oxygen saturation 85%, time below 88% saturation of 4.6 minutes. He was placed on oxygen at night by his primary care physician. He had a baseline sleep study on 07/30/2015: Sleep efficiency was 47.4%, latency to sleep 114.5 minutes, wake after sleep onset was 141.5 minutes with several longer periods of wakefulness and 6 restaurant breaks. The patient wanted to contact the sleep study with his home oxygen level of 2 L/m and therefore the study was done with his supplemental oxygen. He was noted to have severe PLMS. Index was 114.5 per hour, resulting in only 2.3 arousals per hour. He had rare PVCs on EKG. He had an increased percentage of stage II sleep, slow-wave sleep was 10.2%, REM sleep was 14.3%, REM latency was prolonged at 138 minutes. He had minimal snoring. Total AHI was 7.5 per hour. Average oxygen saturation was 97%, nadir was 94%, again with supplemental oxygen at 2 L/m.   07/09/2015: He reports snoring, daytime tiredness, nonrestorative sleep nocturia (3-4 x/night). I reviewed your office note from 06/25/2015. He quit smoking many years ago, over 30 years ago. He drinks alcohol occasionally, he does not use illicit drugs, he drinks non-caffeine coffee. Per wife, he is sleepy during the day.  His Epworth sleepiness score is 12 out of 24 today, his fatigue score is 39/63. He is a retired Emergency planning/management officer. His first wife passed away in 2004-07-18. He remarried in 07-19-07.   He is no longer on Proscar or Flomax, he had side effects from them, including feeling off balance, he is currently on Myrbetriq.   He takes low-dose propanolol, 10 mg twice daily for his tremors which has been  helpful. He has had some weight loss and loss of appetite in the past weeks to months. He had recent blood work in your office, CMP and CBC. You also ordered a CT head without contrast. He was complaining of memory loss. He had a head CT without contrast on 07/02/2015: IMPRESSION: No acute abnormality noted.   He also had a recent nocturnal pulse oximetry test through your office, results are pending, this test was done just 2 nights ago. We will request test results from your office.   In addition, I personally reviewed the images through the PACS system.   He complains of lack of energy and the need to lay down to rest and he may doze off.   He has aching in the L knee and he twitches his feet in sleep, per wife. He sees Randy Sullivan for this has had injections to the L knee, including Synvisc.  Sometimes he has has bifrontal head pressure in the mornings.   He does not have a sleep apnea family history. He has an older sister, she is not very well, has not been diagnosed with sleep apnea. He has no children.   His Past Medical History Is Significant For: Past Medical History:  Diagnosis Date  . BPH (benign prostatic hyperplasia)   . DJD (degenerative joint disease)   . Familial tremor   . GERD (gastroesophageal reflux disease)   . Hyperlipidemia   . Hypertension   . Mood disorder ([facility])   . Osteoarthritis   . Prostatism   . Shortness of breath   . Thyroid disease     His Past Surgical History Is Significant For: Past Surgical History:  Procedure Laterality Date  . APPENDECTOMY    . HEMORROIDECTOMY      His Family History Is Significant For: Family History  Problem Relation Age of Onset  . Heart attack Father   . Colon cancer Mother   . Colon cancer Sister     His Social History Is Significant For: Social History   Social History  . Marital status: Married    Spouse name: N/A  . Number of children: N/A  . Years of education: N/A   Occupational History  . retired     Social History Main Topics  . Smoking status: Former Smoker    Packs/day: 2.00    Years: 18.00    Types: Cigarettes    Quit date: 06/04/1970  . Smokeless tobacco: Never Used  . Alcohol use 0.0 oz/week  . Drug use: No  . Sexual activity: Not Asked   Other Topics Concern  . None   Social History Narrative   Rare caffeine use     His Allergies Are:  No Known Allergies:   His Current Medications Are:  Outpatient Encounter Prescriptions as of 12/18/2015  Medication Sig  . aspirin 81 MG tablet Take 81 mg by mouth daily.    . Cholecalciferol (VITAMIN D) 1000 UNITS capsule Take 2,000 Units by mouth daily.    . fluticasone (FLONASE) 50 MCG/ACT nasal spray Place 1 spray into both  nostrils daily.  . fluticasone furoate-vilanterol (BREO ELLIPTA) 100-25 MCG/INH AEPB Inhale 1 puff into the lungs daily.  Marland Kitchen gabapentin (NEURONTIN) 100 MG capsule Take 1 capsule (100 mg total) by mouth at bedtime.  Marland Kitchen levothyroxine (SYNTHROID, LEVOTHROID) 112 MCG tablet Take 112 mcg by mouth daily before breakfast.   . montelukast (SINGULAIR) 10 MG tablet Take 1 tablet (10 mg total) by mouth at bedtime.  Marland Kitchen MYRBETRIQ 50 MG TB24 tablet   . propranolol (INDERAL) 10 MG tablet Take 10 mg by mouth 2 (two) times daily.   . vitamin B-12 (CYANOCOBALAMIN) 1000 MCG tablet Take 1,200 mcg by mouth daily.   No facility-administered encounter medications on file as of 12/18/2015.   :  Review of Systems:  Out of a complete 14 point review of systems, all are reviewed and negative with the exception of these symptoms as listed below:  Review of Systems  Neurological:       Patient does not feel that the Gabapentin has made a difference. Wife reports that she has noticed a difference and he is less restless.     Objective:  Neurologic Exam  Physical Exam Physical Examination:   Vitals:   12/18/15 0940  BP: (!) 183/87  Pulse: 66  Resp: 16   General Examination: The patient is a very pleasant 82 y.o. male in no  acute distress. He appears well-developed and well-nourished and well groomed. He is in good spirits today.  HEENT: Normocephalic, atraumatic, pupils are equal, round and reactive to light and accommodation. Has b/l cataracts. Extraocular tracking is good without limitation to gaze excursion or nystagmus noted. Normal smooth pursuit is noted. Hearing is impaired. Face is symmetric with normal facial animation and normal facial sensation. Speech is clear with no dysarthria noted. There is no lip, neck/head, jaw tremor, slight voice tremor, speech is slightly hypophonic. Neck is supple with full range of passive and active motion. There are no carotid bruits on auscultation. Oropharynx exam reveals: mild mouth dryness, adequate dental hygiene and mild airway crowding, due to redundant soft palate. Mallampati is class II. Tongue protrudes centrally and palate elevates symmetrically.    Chest: Clear to auscultation without wheezing, rhonchi or crackles noted.  Heart: S1+S2+0, regular and normal without murmurs, rubs or gallops noted.   Abdomen: Soft, non-tender and non-distended with normal bowel sounds appreciated on auscultation.  Extremities: There is no pitting edema in the distal lower extremities bilaterally. Pedal pulses are intact.  Skin: Warm and dry without trophic changes noted. There are no varicose veins.  Musculoskeletal: exam reveals no obvious joint deformities, mild tenderness in both knees, R more than L and bilateral ankle discomfort.    Neurologically:   Mental status: The patient is awake, alert and oriented in all 4 spheres. His immediate and remote memory, attention, language skills and fund of knowledge are fairly appropriate. There is no evidence of aphasia, agnosia, apraxia or anomia. Speech is clear with normal prosody and enunciation. Thought process is linear. Mood is normal and affect is normal.  Cranial nerves II - XII are as described above under HEENT exam. In  addition: shoulder shrug is normal with equal shoulder height noted. Motor exam: Normal bulk, strength and tone is noted. There is no drift, tremor or rebound, but handwriting slightly tremulous. Romberg is negative. Reflexes are 1+ throughout. Fine motor skills and coordination: slightly difficult with the UEs.   Cerebellar testing: No dysmetria or intention tremor on finger to nose testing. Heel to shin is difficult for  him. unremarkable bilaterally. There is no truncal or gait ataxia.  Sensory exam: intact to in the upper and lower extremities.  Gait, station and balance: He stands with difficulty. No veering to one side is noted. No leaning to one side is noted. Posture is age-appropriate and stance is narrow based. Gait shows limp on the R. He turns cautiously, in 3 steps. Tandem walk is not possible.   Assessment and Plan:   In summary, EIVAN GALLINA is a very pleasant 82 year old male with an underlying medical history of hypothyroidism, hypertension, reflux disease, BPH, actinic keratosis, degenerative joint disease, particularly of the knees, tremor, seasonal allergies, left ankle fracture, who presents for follow-up consultation of his sleep disturbance, including PLMD and mild OSA. He had a sleep study on 07/30/2015 which showed an overall AHI of 7.5 per hour, O2 nadir reassuringly at 94% only. He had severe PLMS with an index of 114.5 per hour, with minimal arousals. Nevertheless, I suggested we try him on low-dose gabapentin. Sleep is more consolidated, he is less restless at night, does feel improvement in his leg twitching is but does have intermittent exacerbations. I suggested we try to increase his gabapentin to 200 mg each night, they were agreeable, I adjusted his prescription in that regard for 90 day supply. He is advised to start using a cane because he has had exacerbation of joint pain and encouraged to consider seeing his orthopedic doctor again.  I would like to see him back in 6  months,  sooner as needed. I answered all her questions today and they were in agreement.  I spent 15 minutes in total face-to-face time with the patient, more than 50% of which was spent in counseling and coordination of care, reviewing test results, reviewing medication and discussing or reviewing the diagnosis of OSA, PLMD, the prognosis and treatment options.

## 2015-12-18 NOTE — Patient Instructions (Signed)
I would encourage you to use a cane, you are limping on the right.   We will increase your gabapentin to 200 mg each night, which is 2 pills each bedtime.

## 2015-12-19 ENCOUNTER — Encounter: Payer: Self-pay | Admitting: Physical Therapy

## 2015-12-19 ENCOUNTER — Ambulatory Visit: Payer: MEDICARE | Admitting: Physical Therapy

## 2015-12-19 DIAGNOSIS — M6281 Muscle weakness (generalized): Secondary | ICD-10-CM | POA: Diagnosis not present

## 2015-12-19 DIAGNOSIS — R2689 Other abnormalities of gait and mobility: Secondary | ICD-10-CM | POA: Diagnosis not present

## 2015-12-19 DIAGNOSIS — R2681 Unsteadiness on feet: Secondary | ICD-10-CM | POA: Diagnosis not present

## 2015-12-19 NOTE — Therapy (Signed)
[facility] [address] [place], [place], [address] Phone: [phone]   Fax:  [phone]  Physical Therapy Treatment  Patient Details  Name: Randy Sullivan MRN: [id number] Date of Birth: 01/05/1929 Referring Provider: Prince Solian, MD  Encounter Date: 12/19/2015      PT End of Session - 12/19/15 1022    Visit Number 6   Number of Visits 17   Date for PT Re-Evaluation 01/23/16   Authorization Type [employer]   Authorization Time Period G-Code every 10th visit   PT Start Time 1018   PT Stop Time 1100   PT Time Calculation (min) 42 min   Equipment Utilized During Treatment Gait belt   Activity Tolerance Patient tolerated treatment well   Behavior During Therapy WFL for tasks assessed/performed      Past Medical History:  Diagnosis Date  . BPH (benign prostatic hyperplasia)   . DJD (degenerative joint disease)   . Familial tremor   . GERD (gastroesophageal reflux disease)   . Hyperlipidemia   . Hypertension   . Mood disorder ([place])   . Osteoarthritis   . Prostatism   . Shortness of breath   . Thyroid disease     Past Surgical History:  Procedure Laterality Date  . APPENDECTOMY    . HEMORROIDECTOMY      There were no vitals filed for this visit.      Subjective Assessment - 12/19/15 1021    Subjective No new complaints. Reports knee pain is better today as he is wearing bil knee braces to help with this. No falls to report.    Patient is accompained by: Family member   Pertinent History Goes by "[name]", Hypothyroidism, HTN, actinic keratosis, DJD, OA, tremor, L ankle fx   Limitations Walking;Standing;House hold activities   Patient Stated Goals "To get back to my normal way of doing things."   Currently in Pain? Yes   Pain Score 3    Pain Location Knee   Pain Orientation Right;Left   Pain Descriptors / Indicators Aching;Sore   Pain Type Chronic pain   Pain Onset More than a month ago    Pain Frequency Intermittent   Aggravating Factors  uneven surfaces   Pain Relieving Factors resting, knee braces/supports              [facility] Adult PT Treatment/Exercise - 12/19/15 1024      Ambulation/Gait   Ambulation/Gait Yes     High Level Balance   High Level Balance Activities Tandem walking;Marching forwards;Marching backwards  tandem gait fwd/bwd, toe walking fwd/bwd   High Level Balance Comments on red mats next to counter top: 3 laps each/each way with single UE support at times on counter for balance, min guard to min assist for balance with cues on posture and ex form     Knee/Hip Exercises: Aerobic   Other Aerobic Scifit LE/UE's x 8 minutes at level 2.0 with goal rpm >/= 60 rpm for strengthening and activity tolerance     Knee/Hip Exercises: Standing   Hip Abduction AROM;Stengthening;Both;1 set;10 reps;Knee straight;Limitations   Abduction Limitations with red band resistance: alternating legs with UE support, cues on posture, ex technique/form   Hip Extension AROM;Stengthening;Both;1 set;10 reps;Knee straight;Limitations   Extension Limitations with red band resistance: alternating LE's with UE support. cues on posture and ex form/technique           Balance Exercises - 12/19/15 1042      Balance Exercises: Standing  SLS with Vectors Foam/compliant surface;Other reps (comment);Limitations     Balance Exercises: Standing   SLS with Vectors Limitations 6 cones along edges of red mats: alternating fwd toe taps to each with side stepping left<>right, alternating flipping over/up with side stepping left<>right. min to mod HHA on 1 side for balance with cues on posture, stance/base of support and weight shifitng to assist with balance.             PT Short Term Goals - 11/25/15 1108      PT SHORT TERM GOAL #1   Title Pt will be independent and verbalize understanding of initial HEP to continue progress made in therapy. (Target Date for all STGs:  12/26/15)   Time 4   Period Weeks   Status New     PT SHORT TERM GOAL #2   Title Pt will improve Berg Balance score to > or = 35/56 to indicate an improvement in static balance andincr safety when performing ADLs. 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 (Target Date: 12/26/15)   Time 4   Period Weeks   Status New     PT SHORT TERM GOAL #4   Title Pt will improve TUG time to < or = 22.00 sec to improve functional mobility. (Target Date:12/26/15)   Time 4   Period Weeks   Status New     PT SHORT TERM GOAL #5   Title Pt will ambulate 263ft indoors over level surfaces, ramps, and curbs with supervision and LRAD to incr functional mobiliyt and incr safety when ambulating at home. (Target Date: 12/26/15)   Time 4   Period Weeks   Status New           PT Long Term Goals - 11/25/15 1116      PT LONG TERM GOAL #1   Title Pt will be independent and verbalize understanding of HEP and on-going fitness plan to maintain progress made in therapy and improve overall health. (Target Date for all LTGs: 01/23/16)   Time 8   Period Weeks   Status New     PT LONG TERM GOAL #2   Title Pt will improve Berg Balance score to > or = 40/56 to indicate decr risk of falls. (Target Date: 01/23/16)   Time 8   Period Weeks   Status New     PT LONG TERM GOAL #3   Title Pt will improve TUG time to < or = 19 sec to indicate an incr in functional mobility and transfers. (01/23/16)   Time 8   Period Weeks   Status New     PT LONG TERM GOAL #4   Title Pt will negotiate 12 stairs with unilat UE support and mod I to incr safety when negotiating stairs at home. (Target Date: 01/23/16)   Time 8   Period Weeks   Status New     PT LONG TERM GOAL #5   Title Pt will ambulate 521ft with LRAD and mod I  outdoors over unlevel surfaces, pavement, grass, gravel, and ramps to incr safety when working in his yard at home. (Target Date: 01/23/16)   Time 8   Period Weeks   Status New  Plan - 12/19/15 1022    Clinical Impression Statement During today's skilled session continued to focus on high level balance activites and LE strengthening without any issues reported. Pt is making steady progress toward goals and should benefit from continued PT to progress toward unmet goals.   Rehab Potential Good   Clinical Impairments Affecting Rehab Potential HTN, OA, L ankle fx, Hard of hearing, DJD   PT Frequency 2x / week   PT Duration 8 weeks   PT Treatment/Interventions ADLs/Self Care Home Management;Functional mobility training;Stair training;Gait training;DME Instruction;Therapeutic activities;Therapeutic exercise;Balance training;Neuromuscular re-education;Patient/family education;Manual techniques;Energy conservation   PT Next Visit Plan cont high level balance and gait training   PT Home Exercise Plan [place] HEP   Consulted and Agree with Plan of Care Patient;Family member/caregiver   Family Member Consulted Wife, [name]      Patient will benefit from skilled therapeutic intervention in order to improve the following deficits and impairments:  Abnormal gait, Decreased activity tolerance, Decreased balance, Decreased mobility, Decreased knowledge of use of DME, Decreased endurance, Decreased safety awareness, Decreased strength, Impaired perceived functional ability, Impaired flexibility, Postural dysfunction, Improper body mechanics, Pain  Visit Diagnosis: Unsteadiness on feet  Other abnormalities of gait and mobility  Muscle weakness (generalized)     Problem List Patient Active Problem List   Diagnosis Date Noted  . OSA (obstructive sleep apnea) 10/23/2015  . Upper airway cough syndrome 10/23/2015  . Exertional dyspnea 10/23/2015  . RLS (restless legs syndrome) 10/23/2015   . Osteoarthritis of knee 06/04/2010  . Dizziness 06/04/2010  . Benign hypertensive heart disease without heart failure 06/04/2010  . Hypercholesterolemia 06/04/2010  . BPH (benign prostatic hyperplasia) 06/04/2010    Willow Ora, PTA, [facility] [address] [place], [place] [address] [phone] 12/19/15, 3:10 PM   Name: LADAVION LONGMIRE MRN: [id number] Date of Birth: [birth year]

## 2015-12-23 ENCOUNTER — Ambulatory Visit: Payer: MEDICARE | Admitting: Physical Therapy

## 2015-12-23 ENCOUNTER — Encounter: Payer: Self-pay | Admitting: Physical Therapy

## 2015-12-23 DIAGNOSIS — R2689 Other abnormalities of gait and mobility: Secondary | ICD-10-CM

## 2015-12-23 DIAGNOSIS — M6281 Muscle weakness (generalized): Secondary | ICD-10-CM

## 2015-12-23 DIAGNOSIS — R2681 Unsteadiness on feet: Secondary | ICD-10-CM | POA: Diagnosis not present

## 2015-12-23 NOTE — Therapy (Signed)
[facility] [address] [place], [place], [address] Phone: [phone]   Fax:  [phone]  Physical Therapy Treatment  Patient Details  Name: Randy Sullivan MRN: [id number] Date of Birth: [birth year] Referring Provider: Chilton Greathouse, MD  Encounter Date: 12/23/2015      PT End of Session - 12/23/15 1603    Visit Number 7   Number of Visits 17   Date for PT Re-Evaluation 01/23/16   Authorization Type Medicare [employer]   Authorization Time Period G-Code every 10th visit   PT Start Time 0850   PT Stop Time 0930   PT Time Calculation (min) 40 min   Equipment Utilized During Treatment Gait belt   Activity Tolerance Patient tolerated treatment well   Behavior During Therapy [facility] for tasks assessed/performed      Past Medical History:  Diagnosis Date  . BPH (benign prostatic hyperplasia)   . DJD (degenerative joint disease)   . Familial tremor   . GERD (gastroesophageal reflux disease)   . Hyperlipidemia   . Hypertension   . Mood disorder ([facility])   . Osteoarthritis   . Prostatism   . Shortness of breath   . Thyroid disease     Past Surgical History:  Procedure Laterality Date  . APPENDECTOMY    . HEMORROIDECTOMY      There were no vitals filed for this visit.      Subjective Assessment - 12/23/15 0856    Subjective He bought a pulse oximeter. He reports high 90 for oxygen.    Pertinent History Goes by "Randy Sullivan", Hypothyroidism, HTN, actinic keratosis, DJD, OA, tremor, L ankle fx   Limitations Walking;Standing;House hold activities   Patient Stated Goals "To get back to my normal way of doing things."   Currently in Pain? No/denies            [facility] PT Assessment - 12/23/15 0850      Ambulation/Gait   Gait velocity 2.21 ft/sec no device  1.72 ft/sec with cane,      Berg Balance Test   Sit to Stand Able to stand without using hands and stabilize independently   Standing Unsupported Able to  stand safely 2 minutes   Sitting with Back Unsupported but Feet Supported on Floor or Stool Able to sit safely and securely 2 minutes   Stand to Sit Sits safely with minimal use of hands   Transfers Able to transfer safely, definite need of hands   Standing Unsupported with Eyes Closed Able to stand 10 seconds with supervision   Standing Ubsupported with Feet Together Able to place feet together independently and stand for 1 minute with supervision   From Standing, Reach Forward with Outstretched Arm Can reach forward >12 cm safely (5")   From Standing Position, Pick up Object from Floor Able to pick up shoe, needs supervision   From Standing Position, Turn to Look Behind Over each Shoulder Turn sideways only but maintains balance   Turn 360 Degrees Needs close supervision or verbal cueing   Standing Unsupported, Alternately Place Feet on Step/Stool Needs assistance to keep from falling or unable to try   Standing Unsupported, One Foot in Front Able to take small step independently and hold 30 seconds   Standing on One Leg Tries to lift leg/unable to hold 3 seconds but remains standing independently   Total Score 37   Berg comment: Initial Berg 32/56     Timed Up and Go Test   Normal TUG (seconds)  19.28  19.28 sec trial 1, 17.74 sec trial 2                     [facility] Adult PT Treatment/Exercise - 12/23/15 0850      Ambulation/Gait   Ambulation/Gait Yes   Ambulation/Gait Assistance 5: Supervision   Ambulation/Gait Assistance Details PT instructed in balance & benefits to use cane for community & no device in home. PT gave tactile & verbal cues on upright posture & step length   Ambulation Distance (Feet) 500 Feet  500' with cane & 150' without device   Assistive device None;Straight cane   Gait Pattern Step-through pattern;Decreased stride length;Decreased hip/knee flexion - right;Decreased hip/knee flexion - left;Decreased dorsiflexion - right;Decreased dorsiflexion -  left;Trunk flexed;Narrow base of support;Poor foot clearance - left;Poor foot clearance - right   Stairs Yes   Stairs Assistance 5: Supervision   Stairs Assistance Details (indicate cue type and reason) tactile & verbal cues on wt shift    Stair Management Technique Two rails;Alternating pattern;Forwards   Number of Stairs 4   Ramp 5: Supervision  cane    Ramp Details (indicate cue type and reason) cues on cane use, posture & wt shift   Curb 5: Supervision  min gaurd with cane   Curb Details (indicate cue type and reason) demo & verbal cues on technique                PT Education - 12/23/15 0930    Education provided Yes   Education Details benefits to cane use for community activities   Person(s) Educated Patient   Methods Explanation;Verbal cues   Comprehension Verbalized understanding;Verbal cues required          PT Short Term Goals - 12/23/15 1554      PT SHORT TERM GOAL #1   Title Pt will be independent and verbalize understanding of initial HEP to continue progress made in therapy. (Target Date for all STGs: 12/26/15)   Baseline MET 12/23/2015   Time 4   Period Weeks   Status Achieved     PT SHORT TERM GOAL #2   Title Pt will improve Berg Balance score to > or = 35/56 to indicate an improvement in static balance andincr safety when performing ADLs. 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 (Target Date: 12/26/15)   Baseline 12/23/2015 improved but not met: 2.33f/sec without device & 1.750fsec with cane   Time 4   Period Weeks   Status Partially Met     PT SHORT TERM GOAL #4   Title Pt will improve TUG time to < or = 22.00 sec to improve functional mobility. (Target Date:12/26/15)   Baseline MET 12/23/2015  TUG 19.28sec   Time 4   Period Weeks   Status Achieved     PT SHORT TERM GOAL #5   Title Pt will ambulate 25053fndoors over level surfaces, ramps, and curbs with supervision and LRAD to incr functional mobiliyt and incr safety when ambulating at home. (Target Date: 12/26/15)   Time 4   Period Weeks   Status On-going           PT Long Term Goals -  12/23/15 1605      PT LONG TERM GOAL #1   Title Pt will be independent and verbalize understanding of HEP and on-going fitness plan to maintain progress made in therapy and improve overall health. (Target Date for all LTGs: 01/23/16)   Time 8   Period Weeks   Status On-going     PT LONG TERM GOAL #2   Title Pt will improve Berg Balance score to > or = 40/56 to indicate decr risk of falls. (Target Date: 01/23/16)   Time 8   Period Weeks   Status On-going     PT LONG TERM GOAL #3   Title Pt will improve TUG time to < or = 19 sec to indicate an incr in functional mobility and transfers. (01/23/16)   Time 8   Period Weeks   Status On-going     PT LONG TERM GOAL #4   Title Pt will negotiate 12 stairs with unilat UE support and mod I to incr safety when negotiating stairs at home. (Target Date: 01/23/16)   Time 8   Period Weeks   Status On-going     PT LONG TERM GOAL #5   Title Pt will ambulate 529f with LRAD and mod I outdoors over unlevel surfaces, pavement, grass, gravel, and ramps to incr safety when working in his yard at home. (Target Date: 01/23/16)   Time 8   Period Weeks   Status On-going               Plan - 12/23/15 1605    Clinical Impression Statement Patient met 4 of 5 STGs set for first 30 days. He appears would be safer with use of cane for community mobility & no device in home.    Rehab Potential Good   Clinical Impairments Affecting Rehab Potential HTN, OA, L ankle fx, Hard of hearing, DJD   PT Frequency 2x / week   PT Duration 8 weeks   PT Treatment/Interventions ADLs/Self Care Home  Management;Functional mobility training;Stair training;Gait training;DME Instruction;Therapeutic activities;Therapeutic exercise;Balance training;Neuromuscular re-education;Patient/family education;Manual techniques;Energy conservation   PT Next Visit Plan assess remaining STG, check his cane that he is bringing from home, gait outdoors with cane, check HEP he reports performing from tv   PT [place] Agree with Plan of Care Patient   Family Member Consulted --      Patient will benefit from skilled therapeutic intervention in order to improve the following deficits and impairments:  Abnormal gait, Decreased activity tolerance, Decreased balance, Decreased mobility, Decreased knowledge of use of DME, Decreased endurance, Decreased safety awareness, Decreased strength, Impaired perceived functional ability, Impaired flexibility, Postural dysfunction, Improper body mechanics, Pain  Visit Diagnosis: Unsteadiness on feet  Other abnormalities of gait and mobility  Muscle weakness (generalized)     Problem List Patient Active Problem List   Diagnosis Date Noted  . OSA (obstructive sleep apnea) 10/23/2015  . Upper airway cough syndrome 10/23/2015  . Exertional dyspnea 10/23/2015  . RLS (restless legs syndrome) 10/23/2015  . Osteoarthritis of knee 06/04/2010  . Dizziness 06/04/2010  . Benign hypertensive heart disease without heart failure 06/04/2010  . Hypercholesterolemia 06/04/2010  . BPH (benign prostatic hyperplasia) 06/04/2010    WJamey ReasPT, DPT 12/23/2015, 4:08 PM  [facility] [address]Phone: [phone]  Fax:  [phone] Name: Randy STITELYMRN: [id number] of Birth: 4October 14, 1930

## 2015-12-25 ENCOUNTER — Ambulatory Visit: Payer: MEDICARE | Attending: Internal Medicine | Admitting: Physical Therapy

## 2015-12-25 ENCOUNTER — Encounter: Payer: Self-pay | Admitting: Physical Therapy

## 2015-12-25 DIAGNOSIS — M6281 Muscle weakness (generalized): Secondary | ICD-10-CM

## 2015-12-25 DIAGNOSIS — R2681 Unsteadiness on feet: Secondary | ICD-10-CM

## 2015-12-25 DIAGNOSIS — R2689 Other abnormalities of gait and mobility: Secondary | ICD-10-CM | POA: Diagnosis not present

## 2015-12-25 NOTE — Patient Instructions (Addendum)
Feet Apart (Compliant Surface) Head Motion - Eyes Open    Stand in corner with chair back in front of you with eyes open, standing on compliant surface: __foam______, feet shoulder width apart,  Move head slowly: A. Right / left 10 times    B. Up / down 10 times    C. Up-right / down-left 10 times    D. Up-left / down-right 10 times Do __1__ sessions per day.  Copyright  VHI. All rights reserved.

## 2015-12-25 NOTE — Therapy (Signed)
[facility] [address] [place], [place], [address] Phone: [phone]   Fax:  [phone]  Physical Therapy Treatment  Patient Details  Name: Randy Sullivan MRN: [id number] Date of Birth: 07/17/1928 Referring Provider: Chilton Greathouse, MD  Encounter Date: 12/25/2015      PT End of Session - 12/25/15 1124    Visit Number 8   Number of Visits 17   Date for PT Re-Evaluation 01/23/16   Authorization Type [employer]   Authorization Time Period G-Code every 10th visit   PT Start Time 0854   PT Stop Time 0935   PT Time Calculation (min) 41 min   Equipment Utilized During Treatment Gait belt   Activity Tolerance Patient tolerated treatment well   Behavior During Therapy [facility] for tasks assessed/performed      Past Medical History:  Diagnosis Date  . BPH (benign prostatic hyperplasia)   . DJD (degenerative joint disease)   . Familial tremor   . GERD (gastroesophageal reflux disease)   . Hyperlipidemia   . Hypertension   . Mood disorder (HCC)   . Osteoarthritis   . Prostatism   . Shortness of breath   . Thyroid disease     Past Surgical History:  Procedure Laterality Date  . APPENDECTOMY    . HEMORROIDECTOMY      There were no vitals filed for this visit.      Subjective Assessment - 12/25/15 0856    Subjective No falls. He forgot to bring his cane.    Pertinent History Goes by "Randy Sullivan", Hypothyroidism, HTN, actinic keratosis, DJD, OA, tremor, L ankle fx   Limitations Walking;Standing;House hold activities   Patient Stated Goals "To get back to my normal way of doing things."   Currently in Pain? Yes   Pain Score 3    Pain Location Knee   Pain Orientation Left   Pain Descriptors / Indicators Aching;Sore   Pain Type Chronic pain   Pain Onset More than a month ago   Pain Frequency Intermittent                         [facility] Adult PT Treatment/Exercise - 12/25/15 0855       Ambulation/Gait   Ambulation/Gait Yes   Ambulation/Gait Assistance 5: Supervision   Ambulation/Gait Assistance Details verbal cues on sequencing cane with left LE. PT trialed quad tip & std tip on straight cane: pt had improved fluency with std tip.    Ambulation Distance (Feet) 500 Feet  500' X 1, 200' X 2    Assistive device Straight cane   Gait Pattern Step-through pattern;Decreased stride length;Decreased hip/knee flexion - right;Decreased hip/knee flexion - left;Decreased dorsiflexion - right;Decreased dorsiflexion - left;Trunk flexed;Narrow base of support;Poor foot clearance - left;Poor foot clearance - right   Ambulation Surface Indoor;Level;Outdoor;Grass;Gravel;Unlevel   Stairs Yes   Stairs Assistance 5: Supervision   Stairs Assistance Details (indicate cue type and reason) PT demo, instructed proper technique reciprocal ascending & descending with recommendation to only do for last 4 steps on his flight  and rationale to strength/motor control to raise /lower body weight as needed in gait.    Stair Management Technique Two rails;Alternating pattern;Forwards;One rail Right   Number of Stairs 4   Ramp 5: Supervision  cane    Ramp Details (indicate cue type and reason) cues on sequencing cane & posture   Curb 5: Supervision  min gaurd with cane   Curb Details (indicate  cue type and reason) cues on sequence to protect arthritic knee                PT Education - 12/25/15 0900    Education provided Yes   Education Details HEP on compliant surface in corner   Person(s) Educated Patient   Methods Explanation;Demonstration;Tactile cues;Verbal cues   Comprehension Verbalized understanding;Returned demonstration;Verbal cues required;Tactile cues required          PT Short Term Goals - 12/23/15 1554      PT SHORT TERM GOAL #1   Title Pt will be independent and verbalize understanding of initial HEP to continue progress made in therapy. (Target Date for all STGs: 12/26/15)    Baseline MET 12/23/2015   Time 4   Period Weeks   Status Achieved     PT SHORT TERM GOAL #2   Title Pt will improve Berg Balance score to > or = 35/56 to indicate an improvement in static balance andincr safety when performing ADLs. 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 (Target Date: 12/26/15)   Baseline 12/23/2015 improved but not met: 2.75f/sec without device & 1.738fsec with cane   Time 4   Period Weeks   Status Partially Met     PT SHORT TERM GOAL #4   Title Pt will improve TUG time to < or = 22.00 sec to improve functional mobility. (Target Date:12/26/15)   Baseline MET 12/23/2015 TUG 19.28sec   Time 4   Period Weeks   Status Achieved     PT SHORT TERM GOAL #5   Title Pt will ambulate 2502fndoors over level surfaces, ramps, and curbs with supervision and LRAD to incr functional mobiliyt and incr safety when ambulating at home. (Target Date: 12/26/15)   Time 4   Period Weeks   Status On-going           PT Long Term Goals - 12/23/15 1605      PT LONG TERM GOAL #1   Title Pt will be independent and verbalize understanding of HEP and on-going fitness plan to maintain progress made in therapy and improve overall health. (Target Date for all LTGs: 01/23/16)   Time 8   Period Weeks   Status On-going     PT LONG TERM GOAL #2   Title Pt will improve Berg Balance score to > or = 40/56 to indicate decr risk of falls. (Target Date: 01/23/16)   Time 8   Period Weeks   Status On-going     PT LONG TERM GOAL #3   Title Pt will improve TUG time to < or = 19 sec to indicate an incr in functional mobility and transfers. (01/23/16)   Time 8   Period Weeks   Status On-going     PT LONG TERM GOAL #4   Title Pt will negotiate  12 stairs with unilat UE support and mod I to incr safety when negotiating stairs at home. (Target Date: 01/23/16)   Time 8   Period Weeks   Status On-going     PT LONG TERM GOAL #5   Title Pt will ambulate 500f40fth LRAD and mod I outdoors over unlevel surfaces, pavement,  grass, gravel, and ramps to incr safety when working in his yard at home. (Target Date: 01/23/16)   Time 8   Period Weeks   Status On-going               Plan - 12/25/15 1124    Clinical Impression Statement Pt met 5th STG today. He improved gait with cane outdoors with skilled instruction in proper technique. He appears to understand HEP in corner on compliant surface and stairs    Rehab Potential Good   Clinical Impairments Affecting Rehab Potential HTN, OA, L ankle fx, Hard of hearing, DJD   PT Frequency 2x / week   PT Duration 8 weeks   PT Treatment/Interventions ADLs/Self Care Home Management;Functional mobility training;Stair training;Gait training;DME Instruction;Therapeutic activities;Therapeutic exercise;Balance training;Neuromuscular re-education;Patient/family education;Manual techniques;Energy conservation   PT Next Visit Plan check his cane that he is bringing from home, gait outdoors with cane, check HEP he reports performing from tv   PT [place] and Agree with Plan of Care Patient      Patient will benefit from skilled therapeutic intervention in order to improve the following deficits and impairments:  Abnormal gait, Decreased activity tolerance, Decreased balance, Decreased mobility, Decreased knowledge of use of DME, Decreased endurance, Decreased safety awareness, Decreased strength, Impaired perceived functional ability, Impaired flexibility, Postural dysfunction, Improper body mechanics, Pain  Visit Diagnosis: Unsteadiness on feet  Other abnormalities of gait and mobility  Muscle weakness (generalized)     Problem List Patient Active Problem List    Diagnosis Date Noted  . OSA (obstructive sleep apnea) 10/23/2015  . Upper airway cough syndrome 10/23/2015  . Exertional dyspnea 10/23/2015  . RLS (restless legs syndrome) 10/23/2015  . Osteoarthritis of knee 06/04/2010  . Dizziness 06/04/2010  . Benign hypertensive heart disease without heart failure 06/04/2010  . Hypercholesterolemia 06/04/2010  . BPH (benign prostatic hyperplasia) 06/04/2010    Jamey Reas PT, DPT 12/25/2015, 11:36 AM  [facility] [address] [place], [place], [address] Phone: [phone]   Fax:  [phone]  Name: Randy Sullivan MRN: [id number] Date of Birth: [birth year]

## 2015-12-30 ENCOUNTER — Encounter: Payer: Self-pay | Admitting: Physical Therapy

## 2015-12-30 ENCOUNTER — Ambulatory Visit: Payer: MEDICARE | Admitting: Physical Therapy

## 2015-12-30 DIAGNOSIS — R2689 Other abnormalities of gait and mobility: Secondary | ICD-10-CM

## 2015-12-30 DIAGNOSIS — R2681 Unsteadiness on feet: Secondary | ICD-10-CM

## 2015-12-30 DIAGNOSIS — M6281 Muscle weakness (generalized): Secondary | ICD-10-CM | POA: Diagnosis not present

## 2015-12-31 ENCOUNTER — Other Ambulatory Visit: Payer: Self-pay | Admitting: Internal Medicine

## 2015-12-31 ENCOUNTER — Encounter: Payer: Self-pay | Admitting: Adult Health

## 2015-12-31 DIAGNOSIS — R0609 Other forms of dyspnea: Principal | ICD-10-CM

## 2015-12-31 NOTE — Therapy (Signed)
[facility] [address] [place], [place], [address] Phone: [phone]   Fax:  [phone]  Physical Therapy Treatment  Patient Details  Name: Randy Sullivan MRN: [id number] Date of Birth: 1928-11-18 Referring Provider: Prince Solian, MD  Encounter Date: 12/30/2015      PT End of Session - 12/30/15 1200    Visit Number 9   Number of Visits 17   Date for PT Re-Evaluation 01/23/16   Authorization Type Medicare [employer]   Authorization Time Period G-Code every 10th visit   PT Start Time 0854   PT Stop Time 0932   PT Time Calculation (min) 38 min   Equipment Utilized During Treatment Gait belt   Activity Tolerance Patient tolerated treatment well   Behavior During Therapy [facility] for tasks assessed/performed      Past Medical History:  Diagnosis Date  . BPH (benign prostatic hyperplasia)   . DJD (degenerative joint disease)   . Familial tremor   . GERD (gastroesophageal reflux disease)   . Hyperlipidemia   . Hypertension   . Mood disorder ([place])   . Osteoarthritis   . Prostatism   . Shortness of breath   . Thyroid disease     Past Surgical History:  Procedure Laterality Date  . APPENDECTOMY    . HEMORROIDECTOMY      There were no vitals filed for this visit.      Subjective Assessment - 12/30/15 0858    Subjective He went to beach house this week but did not go onto beach due to balance.    Pertinent History Goes by "[name]", Hypothyroidism, HTN, actinic keratosis, DJD, OA, tremor, L ankle fx   Limitations Walking;Standing;House hold activities   Patient Stated Goals "To get back to my normal way of doing things."   Currently in Pain? Yes   Pain Score 3    Pain Location Knee   Pain Orientation Right;Left   Pain Descriptors / Indicators Aching;Sore   Pain Type Chronic pain   Pain Onset More than a month ago   Pain Frequency Intermittent   Aggravating Factors  arthritis, uneven surfaces   Pain  Relieving Factors resting                         [facility] Adult PT Treatment/Exercise - 12/30/15 0855      Ambulation/Gait   Ambulation/Gait Yes   Ambulation/Gait Assistance 5: Supervision   Ambulation/Gait Assistance Details Verbal cues on need to switch his cane tip as his is bald. Tactile & verbal cues on maintaining path with scanning in hall for visual reference.    Assistive device Straight cane   Ambulation Surface Indoor;Level   Ramp 5: Supervision  cane    Ramp Details (indicate cue type and reason) cues on technique including posture   Curb 5: Supervision  min gaurd with cane   Curb Details (indicate cue type and reason) cues on technique including using momentum & proper step length     High Level Balance   High Level Balance Activities Head turns   High Level Balance Comments tactile & verbal cues in hall for visual reference to maintain path     Knee/Hip Exercises: Standing   Heel Raises Both;10 reps   Heel Raises Limitations UE support for balance   Knee Flexion AROM;Both;10 reps   Knee Flexion Limitations UE support for balance, cues on posture   Hip Flexion AROM;Both;10 reps;Knee bent;Knee straight   Hip Flexion  Limitations UE support, cues on posture   Hip Abduction AROM;Both;10 reps;Knee straight  alternating   Abduction Limitations UE support, cues on posture   Hip Extension AROM;Both;10 reps;Knee straight  alternating   Extension Limitations UE support, cues on posture             Balance Exercises - 12/30/15 0855      Balance Exercises: Standing   Standing Eyes Opened Wide (BOA);Head turns;Foam/compliant surface;5 reps  head turns on foam beam, tactile & visual for balance   Standing Eyes Closed Wide (BOA);Solid surface;5 reps  static 5 sec with tactile & head turns with minA   Step Ups Forward;2 inch;Intermittent UE support  stepping on/off anterior & posterior foam beam             PT Short Term Goals - 12/23/15 1554       PT SHORT TERM GOAL #1   Title Pt will be independent and verbalize understanding of initial HEP to continue progress made in therapy. (Target Date for all STGs: 12/26/15)   Baseline MET 12/23/2015   Time 4   Period Weeks   Status Achieved     PT SHORT TERM GOAL #2   Title Pt will improve Berg Balance score to > or = 35/56 to indicate an improvement in static balance andincr safety when performing ADLs. 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 (Target Date: 12/26/15)   Baseline 12/23/2015 improved but not met: 2.43f/sec without device & 1.753fsec with cane   Time 4   Period Weeks   Status Partially Met     PT SHORT TERM GOAL #4   Title Pt will improve TUG time to < or = 22.00 sec to improve functional mobility. (Target Date:12/26/15)   Baseline MET 12/23/2015 TUG 19.28sec   Time 4   Period Weeks   Status Achieved     PT SHORT TERM GOAL #5   Title Pt will ambulate 25049fndoors over level surfaces, ramps, and curbs with supervision and LRAD to incr functional mobiliyt and incr safety when ambulating at home. (Target Date: 12/26/15)   Time 4   Period Weeks   Status On-going           PT Long Term Goals - 12/23/15 1605      PT LONG TERM GOAL #1   Title Pt will be independent and verbalize understanding of HEP and on-going fitness plan to maintain progress made in therapy and improve overall health. (Target Date for all LTGs: 01/23/16)   Time 8   Period Weeks   Status On-going     PT LONG TERM GOAL #2   Title Pt will improve Berg Balance score to > or = 40/56 to indicate decr risk of falls. (Target Date: 01/23/16)   Time 8   Period Weeks   Status On-going     PT LONG TERM GOAL #3   Title Pt will improve TUG time to <  or = 19 sec to indicate an incr in functional mobility and transfers. (01/23/16)   Time 8   Period Weeks   Status On-going     PT LONG TERM GOAL #4   Title Pt will  negotiate 12 stairs with unilat UE support and mod I to incr safety when negotiating stairs at home. (Target Date: 01/23/16)   Time 8   Period Weeks   Status On-going     PT LONG TERM GOAL #5   Title Pt will ambulate 538f with LRAD and mod I outdoors over unlevel surfaces, pavement, grass, gravel, and ramps to incr safety when working in his yard at home. (Target Date: 01/23/16)   Time 8   Period Weeks   Status On-going               Plan - 12/30/15 0932    Clinical Impression Statement Patient was challenged with head movements in standing & gait but improved with tactile, verbal & visual cues.    Rehab Potential Good   Clinical Impairments Affecting Rehab Potential HTN, OA, L ankle fx, Hard of hearing, DJD   PT Frequency 2x / week   PT Duration 8 weeks   PT Treatment/Interventions ADLs/Self Care Home Management;Functional mobility training;Stair training;Gait training;DME Instruction;Therapeutic activities;Therapeutic exercise;Balance training;Neuromuscular re-education;Patient/family education;Manual techniques;Energy conservation   PT Next Visit Plan Do G-code with Berg & TUG, community gait with cane including uneven terrain, negotiating around/over barriers and scanning; household without device   PT [place] Agree with Plan of Care Patient      Patient will benefit from skilled therapeutic intervention in order to improve the following deficits and impairments:  Abnormal gait, Decreased activity tolerance, Decreased balance, Decreased mobility, Decreased knowledge of use of DME, Decreased endurance, Decreased safety awareness, Decreased strength, Impaired perceived functional ability, Impaired flexibility, Postural dysfunction, Improper body mechanics, Pain  Visit  Diagnosis: Unsteadiness on feet  Other abnormalities of gait and mobility  Muscle weakness (generalized)     Problem List Patient Active Problem List   Diagnosis Date Noted  . OSA (obstructive sleep apnea) 10/23/2015  . Upper airway cough syndrome 10/23/2015  . Exertional dyspnea 10/23/2015  . RLS (restless legs syndrome) 10/23/2015  . Osteoarthritis of knee 06/04/2010  . Dizziness 06/04/2010  . Benign hypertensive heart disease without heart failure 06/04/2010  . Hypercholesterolemia 06/04/2010  . BPH (benign prostatic hyperplasia) 06/04/2010    WJamey ReasPT, DPT 12/31/2015, 9:23 AM  [facility] [address]Phone: [phone]  Fax:  [phone] Name: JADONNIS SALCEDAMRN: [id number] of Birth: [birth year]

## 2016-01-01 ENCOUNTER — Other Ambulatory Visit: Payer: Self-pay

## 2016-01-01 ENCOUNTER — Ambulatory Visit (HOSPITAL_COMMUNITY): Payer: MEDICARE | Attending: Cardiology

## 2016-01-01 ENCOUNTER — Ambulatory Visit: Payer: MEDICARE | Admitting: Physical Therapy

## 2016-01-01 ENCOUNTER — Encounter: Payer: Self-pay | Admitting: Physical Therapy

## 2016-01-01 ENCOUNTER — Encounter (INDEPENDENT_AMBULATORY_CARE_PROVIDER_SITE_OTHER): Payer: Self-pay

## 2016-01-01 DIAGNOSIS — R0600 Dyspnea, unspecified: Secondary | ICD-10-CM | POA: Diagnosis present

## 2016-01-01 DIAGNOSIS — I119 Hypertensive heart disease without heart failure: Secondary | ICD-10-CM | POA: Insufficient documentation

## 2016-01-01 DIAGNOSIS — E785 Hyperlipidemia, unspecified: Secondary | ICD-10-CM | POA: Diagnosis not present

## 2016-01-01 DIAGNOSIS — R2681 Unsteadiness on feet: Secondary | ICD-10-CM

## 2016-01-01 DIAGNOSIS — M6281 Muscle weakness (generalized): Secondary | ICD-10-CM | POA: Diagnosis not present

## 2016-01-01 DIAGNOSIS — I77810 Thoracic aortic ectasia: Secondary | ICD-10-CM | POA: Insufficient documentation

## 2016-01-01 DIAGNOSIS — R2689 Other abnormalities of gait and mobility: Secondary | ICD-10-CM | POA: Diagnosis not present

## 2016-01-01 DIAGNOSIS — I340 Nonrheumatic mitral (valve) insufficiency: Secondary | ICD-10-CM | POA: Diagnosis not present

## 2016-01-01 DIAGNOSIS — I358 Other nonrheumatic aortic valve disorders: Secondary | ICD-10-CM | POA: Insufficient documentation

## 2016-01-01 DIAGNOSIS — R0609 Other forms of dyspnea: Secondary | ICD-10-CM | POA: Insufficient documentation

## 2016-01-01 NOTE — Therapy (Signed)
[facility] [address] [place], [place], [address] Phone: [phone]   Fax:  [phone]  Physical Therapy Treatment  Patient Details  Name: Randy Sullivan MRN: [id number] Date of Birth: 07/04/1928 Referring Provider: Prince Solian, MD  Encounter Date: 01/01/2016      PT End of Session - 01/01/16 0853    Visit Number 10   Number of Visits 17   Date for PT Re-Evaluation 01/23/16   Authorization Type Medicare [employer]   Authorization Time Period G-Code every 10th visit   PT Start Time 0848   PT Stop Time 0930   PT Time Calculation (min) 42 min   Equipment Utilized During Treatment Gait belt   Activity Tolerance Patient tolerated treatment well   Behavior During Therapy [facility] for tasks assessed/performed      Past Medical History:  Diagnosis Date  . BPH (benign prostatic hyperplasia)   . DJD (degenerative joint disease)   . Familial tremor   . GERD (gastroesophageal reflux disease)   . Hyperlipidemia   . Hypertension   . Mood disorder ([place])   . Osteoarthritis   . Prostatism   . Shortness of breath   . Thyroid disease     Past Surgical History:  Procedure Laterality Date  . APPENDECTOMY    . HEMORROIDECTOMY      There were no vitals filed for this visit.      Subjective Assessment - 01/01/16 0852    Subjective No new complaints. No fall or pain to report today,    Patient is accompained by: Family member   Pertinent History Goes by "[name]", Hypothyroidism, HTN, actinic keratosis, DJD, OA, tremor, L ankle fx   Limitations Walking;Standing;House hold activities   Patient Stated Goals "To get back to my normal way of doing things."   Currently in Pain? No/denies   Pain Score 0-No pain            [facility] PT Assessment - 01/01/16 0855      Berg Balance Test   Sit to Stand Able to stand without using hands and stabilize independently   Standing Unsupported Able to stand safely 2 minutes   Sitting with Back Unsupported but Feet Supported on Floor or Stool Able to sit safely and securely 2 minutes   Stand to Sit Sits safely with minimal use of hands   Transfers Able to transfer safely, minor use of hands   Standing Unsupported with Eyes Closed Able to stand 10 seconds safely   Standing Ubsupported with Feet Together Able to place feet together independently and stand 1 minute safely   From Standing, Reach Forward with Outstretched Arm Can reach forward >12 cm safely (5")  8 inhces   From Standing Position, Pick up Object from Floor Able to pick up shoe safely and easily   From Standing Position, Turn to Look Behind Over each Shoulder Looks behind one side only/other side shows less weight shift  right > left   Turn 360 Degrees Able to turn 360 degrees safely but slowly  >/= 9 sec both ways, no balance issues   Standing Unsupported, Alternately Place Feet on Step/Stool Able to stand independently and safely and complete 8 steps in 20 seconds  16.31 sec's no balance loss   Standing Unsupported, One Foot in Front Able to plae foot ahead of the other independently and hold 30 seconds   Standing on One Leg Tries to lift leg/unable to hold 3 seconds but remains standing independently  Total Score 48   Berg comment: Initial Berg 32/56, at STG 10/30- 37/56, 11/9- 48/56     Timed Up and Go Test   TUG Normal TUG   Normal TUG (seconds) 14.19  no AD           [facility] Adult PT Treatment/Exercise - 01/01/16 0915      Ambulation/Gait   Ambulation/Gait Yes   Ambulation/Gait Assistance 4: Min guard   Assistive device None   Gait Pattern Step-through pattern;Decreased stride length;Decreased step length - right;Decreased step length - left;Right flexed knee in stance;Left flexed knee in stance;Trunk flexed;Narrow base of support   Ambulation Surface Level;Indoor   Gait Comments gait along ~50 foot hallway without AD: forward gait with head movements up<>down and left<>right x 4 laps  each with min guard assist for balance. decreased gait speed noted and veering with head turns.      High Level Balance   High Level Balance Activities Marching forwards;Marching backwards;Tandem walking  tandem fwd/bwd   High Level Balance Comments 3 laps each way with min guard to min assist for balance.            PT Short Term Goals - 12/23/15 1554      PT SHORT TERM GOAL #1   Title Pt will be independent and verbalize understanding of initial HEP to continue progress made in therapy. (Target Date for all STGs: 12/26/15)   Baseline MET 12/23/2015   Time 4   Period Weeks   Status Achieved     PT SHORT TERM GOAL #2   Title Pt will improve Berg Balance score to > or = 35/56 to indicate an improvement in static balance andincr safety when performing ADLs. 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 (Target Date: 12/26/15)   Baseline 12/23/2015 improved but not met: 2.18f/sec without device & 1.729fsec with cane   Time 4   Period Weeks   Status Partially Met     PT SHORT TERM GOAL #4   Title Pt will improve TUG time to < or = 22.00 sec to improve functional mobility. (Target Date:12/26/15)   Baseline MET 12/23/2015 TUG 19.28sec   Time 4   Period Weeks   Status Achieved     PT SHORT TERM GOAL #5   Title Pt will ambulate 25065fndoors over level surfaces, ramps, and curbs with supervision and LRAD to incr functional mobiliyt and incr safety when ambulating at home. (Target Date: 12/26/15)   Time 4   Period Weeks   Status On-going           PT Long Term Goals - 12/23/15 1605      PT LONG TERM GOAL #1   Title Pt will be independent and verbalize understanding of HEP and on-going fitness plan to maintain progress made in  therapy and improve overall health. (Target Date for all LTGs: 01/23/16)   Time 8   Period Weeks   Status On-going     PT LONG TERM GOAL #2   Title Pt will improve Berg Balance score to > or = 40/56 to indicate decr risk of falls. (Target Date: 01/23/16)   Time 8   Period Weeks  Status On-going     PT LONG TERM GOAL #3   Title Pt will improve TUG time to < or = 19 sec to indicate an incr in functional mobility and transfers. (01/23/16)   Time 8   Period Weeks   Status On-going     PT LONG TERM GOAL #4   Title Pt will negotiate 12 stairs with unilat UE support and mod I to incr safety when negotiating stairs at home. (Target Date: 01/23/16)   Time 8   Period Weeks   Status On-going     PT LONG TERM GOAL #5   Title Pt will ambulate 574f with LRAD and mod I outdoors over unlevel surfaces, pavement, grass, gravel, and ramps to incr safety when working in his yard at home. (Target Date: 01/23/16)   Time 8   Period Weeks   Status On-going            Plan - 01/01/16 0854    Clinical Impression Statement With today's skilled session Berg balance test and Timed up and go rechecked for g-code with pt improving both scores. Remainder of session continued to address dynamic gait activites for improved balance reactions. Pt is making steady progress toward goals and should benefit from continued PT to progress toward unmet goals.    Rehab Potential Good   Clinical Impairments Affecting Rehab Potential HTN, OA, L ankle fx, Hard of hearing, DJD   PT Frequency 2x / week   PT Duration 8 weeks   PT Treatment/Interventions ADLs/Self Care Home Management;Functional mobility training;Stair training;Gait training;DME Instruction;Therapeutic activities;Therapeutic exercise;Balance training;Neuromuscular re-education;Patient/family education;Manual techniques;Energy conservation   PT Next Visit Plan  community gait with cane including uneven terrain, negotiating around/over barriers and scanning;  household without device   PT HCadeand Agree with Plan of Care Patient      Patient will benefit from skilled therapeutic intervention in order to improve the following deficits and impairments:  Abnormal gait, Decreased activity tolerance, Decreased balance, Decreased mobility, Decreased knowledge of use of DME, Decreased endurance, Decreased safety awareness, Decreased strength, Impaired perceived functional ability, Impaired flexibility, Postural dysfunction, Improper body mechanics, Pain  Visit Diagnosis: Unsteadiness on feet  Other abnormalities of gait and mobility  Muscle weakness (generalized)     Problem List Patient Active Problem List   Diagnosis Date Noted  . OSA (obstructive sleep apnea) 10/23/2015  . Upper airway cough syndrome 10/23/2015  . Exertional dyspnea 10/23/2015  . RLS (restless legs syndrome) 10/23/2015  . Osteoarthritis of knee 06/04/2010  . Dizziness 06/04/2010  . Benign hypertensive heart disease without heart failure 06/04/2010  . Hypercholesterolemia 06/04/2010  . BPH (benign prostatic hyperplasia) 06/04/2010    KWillow Ora PTA, CThe Advanced Center For Surgery LLCOutpatient Neuro RCentral New York Psychiatric Center[address] SPocahontasGByram [place] [phone]/09/17, 11:22 AM   Name: Randy JOZWIAKMRN: [id number] of Birth: 427-Jun-1930

## 2016-01-06 ENCOUNTER — Encounter: Payer: Self-pay | Admitting: Physical Therapy

## 2016-01-06 ENCOUNTER — Ambulatory Visit: Payer: MEDICARE | Admitting: Physical Therapy

## 2016-01-06 DIAGNOSIS — R2689 Other abnormalities of gait and mobility: Secondary | ICD-10-CM | POA: Diagnosis not present

## 2016-01-06 DIAGNOSIS — N3941 Urge incontinence: Secondary | ICD-10-CM | POA: Diagnosis not present

## 2016-01-06 DIAGNOSIS — M6281 Muscle weakness (generalized): Secondary | ICD-10-CM | POA: Diagnosis not present

## 2016-01-06 DIAGNOSIS — R2681 Unsteadiness on feet: Secondary | ICD-10-CM | POA: Diagnosis not present

## 2016-01-06 DIAGNOSIS — R350 Frequency of micturition: Secondary | ICD-10-CM | POA: Diagnosis not present

## 2016-01-06 NOTE — Therapy (Signed)
[facility] [address] [place], [place], [address] Phone: [phone]   Fax:  [phone]  Physical Therapy Treatment  Patient Details  Name: Randy Sullivan MRN: [id number] Date of Birth: 01/19/1929 Referring Provider: Prince Solian, MD  Encounter Date: 01/06/2016      PT End of Session - 01/06/16 1001    Visit Number 11   Number of Visits 17   Date for PT Re-Evaluation 01/23/16   Authorization Type Medicare [employer]   Authorization Time Period G-Code every 10th visit   PT Start Time 0845   PT Stop Time 0931   PT Time Calculation (min) 46 min   Equipment Utilized During Treatment Gait belt   Activity Tolerance Patient tolerated treatment well   Behavior During Therapy [facility] for tasks assessed/performed      Past Medical History:  Diagnosis Date  . BPH (benign prostatic hyperplasia)   . DJD (degenerative joint disease)   . Familial tremor   . GERD (gastroesophageal reflux disease)   . Hyperlipidemia   . Hypertension   . Mood disorder ([place])   . Osteoarthritis   . Prostatism   . Shortness of breath   . Thyroid disease     Past Surgical History:  Procedure Laterality Date  . APPENDECTOMY    . HEMORROIDECTOMY      There were no vitals filed for this visit.      Subjective Assessment - 01/06/16 0842    Subjective (P)  He has been doing the exercises as able but his wife is ill. No falls.    Pertinent History (P)  Goes by "Randy Sullivan", Hypothyroidism, HTN, actinic keratosis, DJD, OA, tremor, L ankle fx   Limitations (P)  Walking;Standing;House hold activities   Patient Stated Goals (P)  "To get back to my normal way of doing things."   Currently in Pain? (P)  No/denies                         [facility] Adult PT Treatment/Exercise - 01/06/16 0845      Exercises   Exercises Knee/Hip     Knee/Hip Exercises: Stretches   Active Hamstring Stretch Right;Left;1 rep;30 seconds     Knee/Hip  Exercises: Machines for Strengthening   Cybex Leg Press 75# BLE 15 reps with tactile cues for full ROM & control     Knee/Hip Exercises: Standing   Forward Step Up Right;Left;10 reps;Step Height: 2"  minA / tactile cues for balance reactions   Step Down Right;Left;10 reps;Step Height: 2"  minA / tactile cues for balance reactions             Balance Exercises - 01/06/16 0845      Balance Exercises: Standing   Standing Eyes Opened Wide ([place]);Head turns;Foam/compliant surface;5 reps  head turns on foam beam, tactile & visual for balance   Standing Eyes Closed Wide (BOA);Solid surface;5 reps  static 5 sec with tactile & head turns with minA   Step Ups Forward;2 inch;Intermittent UE support  stepping on/off anterior & posterior foam beam   Sidestepping 5 reps  over 2" X 4' beam   Turning Right;Left;10 reps   Step Over Hurdles / Cones alternate stepping over 2" X 4" beam, holding step forward position & pushing into upright posture   Marching Limitations marching in parallel bars forward & backward with intermittent UE support   Heel Raises Limitations light UE support for balance   Other Standing Exercises stepping right &  left with weight shift & rotation progressed to stepping to change directions and walking .            PT Education - 01/06/16 1003    Education provided Yes   Education Details fall risk prevention strategies & concern with using older shoes with wear pattern facilitating lateral lean / roll   Person(s) Educated Patient   Methods Explanation;Verbal cues   Comprehension Verbalized understanding          PT Short Term Goals - 12/23/15 1554      PT SHORT TERM GOAL #1   Title Pt will be independent and verbalize understanding of initial HEP to continue progress made in therapy. (Target Date for all STGs: 12/26/15)   Baseline MET 12/23/2015   Time 4   Period Weeks   Status Achieved     PT SHORT TERM GOAL #2   Title Pt will improve Berg Balance  score to > or = 35/56 to indicate an improvement in static balance andincr safety when performing ADLs. 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 (Target Date: 12/26/15)   Baseline 12/23/2015 improved but not met: 2.39f/sec without device & 1.729fsec with cane   Time 4   Period Weeks   Status Partially Met     PT SHORT TERM GOAL #4   Title Pt will improve TUG time to < or = 22.00 sec to improve functional mobility. (Target Date:12/26/15)   Baseline MET 12/23/2015 TUG 19.28sec   Time 4   Period Weeks   Status Achieved     PT SHORT TERM GOAL #5   Title Pt will ambulate 25059fndoors over level surfaces, ramps, and curbs with supervision and LRAD to incr functional mobiliyt and incr safety when ambulating at home. (Target Date: 12/26/15)   Time 4   Period Weeks   Status On-going           PT Long Term Goals - 12/23/15 1605      PT LONG TERM GOAL #1   Title Pt will be independent and verbalize understanding of HEP and on-going fitness plan to maintain progress made in therapy and improve overall health. (Target Date for all LTGs: 01/23/16)   Time 8   Period Weeks   Status On-going     PT LONG TERM GOAL #2   Title Pt will improve Berg Balance score to > or = 40/56 to indicate decr risk of falls. (Target Date: 01/23/16)   Time 8   Period Weeks   Status On-going     PT LONG TERM GOAL #3   Title Pt will improve TUG time to < or = 19 sec to indicate an incr in functional mobility and transfers. (01/23/16)   Time 8   Period Weeks   Status On-going     PT LONG TERM GOAL #4   Title Pt will negotiate 12 stairs with unilat UE support and mod I to incr safety when negotiating stairs at home. (Target Date: 01/23/16)   Time 8   Period  Weeks   Status On-going     PT LONG TERM GOAL #5   Title Pt will ambulate 500f57fth LRAD and mod  I outdoors over unlevel surfaces, pavement, grass, gravel, and ramps to incr safety when working in his yard at home. (Target Date: 01/23/16)   Time 8   Period Weeks   Status On-going               Plan - 01/06/16 1002    Clinical Impression Statement Patient was able to improve balance reactions with skilled care / instruction. Patient has history of ankle / knee issues but appears this has declined over last few years and is improving with skilled exercise instruction.   Rehab Potential Good   Clinical Impairments Affecting Rehab Potential HTN, OA, L ankle fx, Hard of hearing, DJD   PT Frequency 2x / week   PT Duration 8 weeks   PT Treatment/Interventions ADLs/Self Care Home Management;Functional mobility training;Stair training;Gait training;DME Instruction;Therapeutic activities;Therapeutic exercise;Balance training;Neuromuscular re-education;Patient/family education;Manual techniques;Energy conservation   PT Next Visit Plan  community gait with cane including uneven terrain, negotiating around/over barriers and scanning; household without device   PT [place] and Agree with Plan of Care Patient      Patient will benefit from skilled therapeutic intervention in order to improve the following deficits and impairments:  Abnormal gait, Decreased activity tolerance, Decreased balance, Decreased mobility, Decreased knowledge of use of DME, Decreased endurance, Decreased safety awareness, Decreased strength, Impaired perceived functional ability, Impaired flexibility, Postural dysfunction, Improper body mechanics, Pain  Visit Diagnosis: Unsteadiness on feet  Other abnormalities of gait and mobility  Muscle weakness (generalized)     Problem List Patient Active Problem List   Diagnosis Date Noted  . OSA (obstructive sleep apnea) 10/23/2015  .  Upper airway cough syndrome 10/23/2015  . Exertional dyspnea 10/23/2015  . RLS (restless legs syndrome) 10/23/2015  . Osteoarthritis of knee 06/04/2010  . Dizziness 06/04/2010  . Benign hypertensive heart disease without heart failure 06/04/2010  . Hypercholesterolemia 06/04/2010  . BPH (benign prostatic hyperplasia) 06/04/2010     Shannia Jacuinde PT, DPT 01/06/2016, 10:05 AM  [facility] [address] [place], [place], [address] Phone: [phone]   Fax:  [phone]  Name: GABREAL WORTON MRN: [id number] Date of Birth: 1928/09/22

## 2016-01-07 DIAGNOSIS — Z85828 Personal history of other malignant neoplasm of skin: Secondary | ICD-10-CM | POA: Diagnosis not present

## 2016-01-07 DIAGNOSIS — L570 Actinic keratosis: Secondary | ICD-10-CM | POA: Diagnosis not present

## 2016-01-07 DIAGNOSIS — L814 Other melanin hyperpigmentation: Secondary | ICD-10-CM | POA: Diagnosis not present

## 2016-01-07 DIAGNOSIS — D1801 Hemangioma of skin and subcutaneous tissue: Secondary | ICD-10-CM | POA: Diagnosis not present

## 2016-01-07 DIAGNOSIS — L821 Other seborrheic keratosis: Secondary | ICD-10-CM | POA: Diagnosis not present

## 2016-01-07 DIAGNOSIS — D485 Neoplasm of uncertain behavior of skin: Secondary | ICD-10-CM | POA: Diagnosis not present

## 2016-01-07 DIAGNOSIS — C44712 Basal cell carcinoma of skin of right lower limb, including hip: Secondary | ICD-10-CM | POA: Diagnosis not present

## 2016-01-08 ENCOUNTER — Ambulatory Visit: Payer: MEDICARE | Admitting: Physical Therapy

## 2016-01-08 ENCOUNTER — Encounter: Payer: Self-pay | Admitting: Physical Therapy

## 2016-01-08 DIAGNOSIS — R2689 Other abnormalities of gait and mobility: Secondary | ICD-10-CM | POA: Diagnosis not present

## 2016-01-08 DIAGNOSIS — M6281 Muscle weakness (generalized): Secondary | ICD-10-CM

## 2016-01-08 DIAGNOSIS — R2681 Unsteadiness on feet: Secondary | ICD-10-CM

## 2016-01-09 NOTE — Therapy (Signed)
[facility] [address] [place], [place], [address] Phone: [phone]   Fax:  [phone]  Physical Therapy Treatment  Patient Details  Name: Randy Sullivan MRN: [id number] Date of Birth: 29-Nov-1928 Referring Provider: Prince Solian, MD  Encounter Date: 01/08/2016      PT End of Session - 01/08/16 1337    Visit Number 12   Number of Visits 17   Date for PT Re-Evaluation 01/23/16   Authorization Type [employer]   Authorization Time Period G-Code every 10th visit   PT Start Time 1020   PT Stop Time 1100   PT Time Calculation (min) 40 min   Equipment Utilized During Treatment Gait belt   Activity Tolerance Patient tolerated treatment well   Behavior During Therapy WFL for tasks assessed/performed      Past Medical History:  Diagnosis Date  . BPH (benign prostatic hyperplasia)   . DJD (degenerative joint disease)   . Familial tremor   . GERD (gastroesophageal reflux disease)   . Hyperlipidemia   . Hypertension   . Mood disorder ([place])   . Osteoarthritis   . Prostatism   . Shortness of breath   . Thyroid disease     Past Surgical History:  Procedure Laterality Date  . APPENDECTOMY    . HEMORROIDECTOMY      There were no vitals filed for this visit.      Subjective Assessment - 01/08/16 1335    Subjective He has been doing the exercises as able but his wife is still ill. No falls.    Pertinent History Goes by "Randy Sullivan", Hypothyroidism, HTN, actinic keratosis, DJD, OA, tremor, L ankle fx   Limitations Walking;Standing;House hold activities   Patient Stated Goals "To get back to my normal way of doing things."   Currently in Pain? No/denies     Neuromuscular Re-education in parallel bars for intermittent UE support, mirror for visual reference & tactile cues for balance reactions. Standing crossways on 2" dense foam beam eyes open head turns side, up/down & diagonals.  Standing on floor wide  stance eyes closed head turns. Alternating stepping over 2" X 4" and 4" X 2" beam and holding step position 5 sec. Toe raises & heel raises off beam with UE support 10 reps each. Using colored tiles as visual reference, worked on direction changes with weight shift and not crossing feet. Progressed to stepping to rt & lt and walking 5' to end of bars with intermittent UE touch on bars.   Gait Training:  Pt ambulated 500' including 200' on grass /gravel with single point cane with head turn to scan environment with min guard / supervision. Pt neg ramps & curbs with single point cane with supervision.                               PT Short Term Goals - 01/08/16 1337      PT SHORT TERM GOAL #1   Title Pt will be independent and verbalize understanding of initial HEP to continue progress made in therapy. (Target Date for all STGs: 12/26/15)   Baseline MET 12/23/2015   Time 4   Period Weeks   Status Achieved     PT SHORT TERM GOAL #2   Title Pt will improve Berg Balance score to > or = 35/56 to indicate an improvement in static balance andincr safety when performing ADLs. 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 (Target Date: 12/26/15)   Baseline 12/23/2015 improved but not met: 2.65f/sec without device & 1.718fsec with cane   Time 4   Period Weeks   Status Partially Met     PT SHORT TERM GOAL #4   Title Pt will improve TUG time to < or = 22.00 sec to improve functional mobility. (Target Date:12/26/15)   Baseline MET 12/23/2015 TUG 19.28sec   Time 4   Period Weeks   Status Achieved     PT SHORT TERM GOAL #5   Title Pt will ambulate 25083fndoors over level surfaces, ramps, and curbs with supervision and LRAD to  incr functional mobiliyt and incr safety when ambulating at home. (Target Date: 12/26/15)   Baseline met 01/08/2016   Time 4   Period Weeks   Status Achieved           PT Long Term Goals - 12/23/15 1605      PT LONG TERM GOAL #1   Title Pt will be independent and verbalize understanding of HEP and on-going fitness plan to maintain progress made in therapy and improve overall health. (Target Date for all LTGs: 01/23/16)   Time 8   Period Weeks   Status On-going     PT LONG TERM GOAL #2   Title Pt will improve Berg Balance score to > or = 40/56 to indicate decr risk of falls. (Target Date: 01/23/16)   Time 8   Period Weeks   Status On-going     PT LONG TERM GOAL #3   Title Pt will improve TUG time to < or = 19 sec to indicate an incr in functional mobility and transfers. (01/23/16)   Time 8   Period Weeks   Status On-going     PT LONG TERM GOAL #4   Title Pt will negotiate 12 stairs with unilat UE support and mod I to incr safety when negotiating stairs at home. (Target Date: 01/23/16)   Time 8   Period Weeks   Status On-going     PT LONG TERM GOAL #5   Title Pt will ambulate 500f83fth LRAD and mod I outdoors over unlevel surfaces, pavement, grass, gravel, and ramps to incr safety when working in his yard at home. (Target Date: 01/23/16)   Time 8   Period Weeks   Status On-going               Plan - 01/08/16 1338    Clinical Impression Statement Patient continues to improve balance reactions and reports he feels more stable now. He has balance losses ambulating on grass with cane with head turns to scan environment.    Rehab Potential Good   Clinical Impairments Affecting Rehab Potential HTN, OA, L ankle fx, Hard of hearing, DJD   PT Frequency 2x / week   PT Duration 8 weeks   PT Treatment/Interventions ADLs/Self Care Home Management;Functional mobility training;Stair training;Gait training;DME Instruction;Therapeutic activities;Therapeutic exercise;Balance  training;Neuromuscular re-education;Patient/family education;Manual techniques;Energy conservation   PT Next Visit Plan  community gait with cane including uneven terrain, negotiating around/over barriers and scanning; household without device   PT Home Exercise Plan [place] HEP   Consulted and Agree with  Plan of Care Patient      Patient will benefit from skilled therapeutic intervention in order to improve the following deficits and impairments:  Abnormal gait, Decreased activity tolerance, Decreased balance, Decreased mobility, Decreased knowledge of use of DME, Decreased endurance, Decreased safety awareness, Decreased strength, Impaired perceived functional ability, Impaired flexibility, Postural dysfunction, Improper body mechanics, Pain  Visit Diagnosis: Unsteadiness on feet  Other abnormalities of gait and mobility  Muscle weakness (generalized)     Problem List Patient Active Problem List   Diagnosis Date Noted  . OSA (obstructive sleep apnea) 10/23/2015  . Upper airway cough syndrome 10/23/2015  . Exertional dyspnea 10/23/2015  . RLS (restless legs syndrome) 10/23/2015  . Osteoarthritis of knee 06/04/2010  . Dizziness 06/04/2010  . Benign hypertensive heart disease without heart failure 06/04/2010  . Hypercholesterolemia 06/04/2010  . BPH (benign prostatic hyperplasia) 06/04/2010    Jamey Reas PT, DPT 01/09/2016, 1:41 PM  [facility] [address] [place], [place], [address] Phone: [phone]   Fax:  [phone]  Name: ABIMAEL ZEITER MRN: [id number] Date of Birth: 28-Nov-1928

## 2016-01-13 ENCOUNTER — Encounter: Payer: Self-pay | Admitting: Physical Therapy

## 2016-01-13 ENCOUNTER — Ambulatory Visit: Payer: MEDICARE | Admitting: Physical Therapy

## 2016-01-13 DIAGNOSIS — R2689 Other abnormalities of gait and mobility: Secondary | ICD-10-CM

## 2016-01-13 DIAGNOSIS — M6281 Muscle weakness (generalized): Secondary | ICD-10-CM

## 2016-01-13 DIAGNOSIS — R2681 Unsteadiness on feet: Secondary | ICD-10-CM | POA: Diagnosis not present

## 2016-01-14 ENCOUNTER — Encounter: Payer: Self-pay | Admitting: Physical Therapy

## 2016-01-14 ENCOUNTER — Ambulatory Visit: Payer: MEDICARE | Admitting: Physical Therapy

## 2016-01-14 DIAGNOSIS — R2689 Other abnormalities of gait and mobility: Secondary | ICD-10-CM | POA: Diagnosis not present

## 2016-01-14 DIAGNOSIS — R2681 Unsteadiness on feet: Secondary | ICD-10-CM

## 2016-01-14 DIAGNOSIS — M6281 Muscle weakness (generalized): Secondary | ICD-10-CM

## 2016-01-14 NOTE — Therapy (Signed)
[facility] [address] [place], [place], [address] Phone: [phone]   Fax:  [phone]  Physical Therapy Treatment  Patient Details  Name: Randy Sullivan MRN: [id number] Date of Birth: 02-05-1929 Referring Provider: Prince Solian, MD  Encounter Date: 01/13/2016      PT End of Session - 01/13/16 0930    Visit Number 13   Number of Visits 17   Date for PT Re-Evaluation 01/23/16   Authorization Type Medicare [employer]   Authorization Time Period G-Code every 10th visit   PT Start Time 0845   PT Stop Time 0930   PT Time Calculation (min) 45 min   Equipment Utilized During Treatment Gait belt   Activity Tolerance Patient tolerated treatment well   Behavior During Therapy [facility] for tasks assessed/performed      Past Medical History:  Diagnosis Date  . BPH (benign prostatic hyperplasia)   . DJD (degenerative joint disease)   . Familial tremor   . GERD (gastroesophageal reflux disease)   . Hyperlipidemia   . Hypertension   . Mood disorder ([place])   . Osteoarthritis   . Prostatism   . Shortness of breath   . Thyroid disease     Past Surgical History:  Procedure Laterality Date  . APPENDECTOMY    . HEMORROIDECTOMY      There were no vitals filed for this visit.      Subjective Assessment - 01/13/16 0849    Subjective No falls. He is exercising more now his wife is gettin better   Pertinent History Goes by "Randy Sullivan", Hypothyroidism, HTN, actinic keratosis, DJD, OA, tremor, L ankle fx   Limitations Walking;Standing;House hold activities   Patient Stated Goals "To get back to my normal way of doing things."   Currently in Pain? No/denies                         [facility] Adult PT Treatment/Exercise - 01/13/16 0845      Ambulation/Gait   Ambulation/Gait Yes   Ambulation/Gait Assistance 5: Supervision   Ambulation/Gait Assistance Details worked on gait outside on grass with head turns  scanning    Ambulation Distance (Feet) 500 Feet   Assistive device Straight cane   Ambulation Surface Indoor;Level;Outdoor;Paved;Gravel;Grass   Stairs Yes   Stairs Assistance 5: Supervision   Stairs Assistance Details (indicate cue type and reason) cues on ankle motion with descent   Stair Management Technique One rail Right;Alternating pattern;Forwards   Number of Stairs 4  2 reps   Ramp 5: Supervision  cane   Curb 5: Supervision  cane     High Level Balance   High Level Balance Activities Side stepping;Backward walking;Marching forwards;Marching backwards;Tandem walking;Head turns;Negotitating around obstacles   High Level Balance Comments verbal & tactile cues on balance reactions     Exercises   Exercises Knee/Hip     Knee/Hip Exercises: Stretches   Active Hamstring Stretch Right;Left;1 rep;30 seconds     Knee/Hip Exercises: Machines for Strengthening   Cybex Leg Press 75# BLE 15 reps with tactile cues for full ROM & control     Knee/Hip Exercises: Standing   Forward Step Up Right;Left;10 reps;Step Height: 2"  minA / tactile cues for balance reactions   Step Down Right;Left;10 reps;Step Height: 2"  minA / tactile cues for balance reactions             Balance Exercises - 01/13/16 0845      Balance Exercises: Standing  Standing Eyes Opened Wide (BOA);Head turns;Foam/compliant surface;5 reps  head turns on foam beam, tactile & visual for balance   Standing Eyes Closed Wide (BOA);Solid surface;5 reps  static 5 sec with tactile & head turns with minA   Stepping Strategy Anterior;Posterior;10 reps  alternate stepping on/off foam beam, holding 5 sec   Rockerboard Anterior/posterior;Lateral;EC;EO;Head turns;10 reps;Intermittent UE support  tactile cues for balance reactions, static EC, wt shift/hold   Step Ups Forward;2 inch;Intermittent UE support  LE on foam beam, tapping cone with contralateral LE   Balance Beam static EC, head turns rt/lt, up/down & diagonals    Sidestepping 5 reps  over 2" X 4' beam   Turning Right;Left;10 reps   Step Over Hurdles / Cones alternate stepping over 2" X 4" beam, holding step forward position & pushing into upright posture   Marching Limitations marching in parallel bars forward & backward with intermittent UE support   Heel Raises Limitations light UE support for balance with toes on beam   Toe Raise Limitations light UE support for balance with heels on beam   Other Standing Exercises stepping right & left with weight shift & rotation progressed to stepping to change directions and walking .            PT Education - 01/13/16 0930    Education provided Yes   Education Details need for ongoing fitness after PT discharge including components of flexibility, strength, balance & endurance.   Person(s) Educated Patient   Methods Explanation   Comprehension Verbalized understanding          PT Short Term Goals - 01/08/16 1337      PT SHORT TERM GOAL #1   Title Pt will be independent and verbalize understanding of initial HEP to continue progress made in therapy. (Target Date for all STGs: 12/26/15)   Baseline MET 12/23/2015   Time 4   Period Weeks   Status Achieved     PT SHORT TERM GOAL #2   Title Pt will improve Berg Balance score to > or = 35/56 to indicate an improvement in static balance andincr safety when performing ADLs. 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 (Target Date: 12/26/15)   Baseline 12/23/2015 improved but not met: 2.44f/sec without device & 1.788fsec with cane   Time 4   Period Weeks   Status Partially Met     PT SHORT TERM GOAL #4   Title Pt will improve TUG time to < or = 22.00 sec to improve functional mobility. (Target  Date:12/26/15)   Baseline MET 12/23/2015 TUG 19.28sec   Time 4   Period Weeks   Status Achieved     PT SHORT TERM GOAL #5   Title Pt will ambulate 25063fndoors over level surfaces, ramps, and curbs with supervision and LRAD to incr functional mobiliyt and incr safety when ambulating at home. (Target Date: 12/26/15)   Baseline met 01/08/2016   Time 4   Period Weeks   Status Achieved           PT Long Term Goals - 12/23/15 1605      PT LONG TERM GOAL #1   Title Pt will be independent  and verbalize understanding of HEP and on-going fitness plan to maintain progress made in therapy and improve overall health. (Target Date for all LTGs: 01/23/16)   Time 8   Period Weeks   Status On-going     PT LONG TERM GOAL #2   Title Pt will improve Berg Balance score to > or = 40/56 to indicate decr risk of falls. (Target Date: 01/23/16)   Time 8   Period Weeks   Status On-going     PT LONG TERM GOAL #3   Title Pt will improve TUG time to < or = 19 sec to indicate an incr in functional mobility and transfers. (01/23/16)   Time 8   Period Weeks   Status On-going     PT LONG TERM GOAL #4   Title Pt will negotiate 12 stairs with unilat UE support and mod I to incr safety when negotiating stairs at home. (Target Date: 01/23/16)   Time 8   Period Weeks   Status On-going     PT LONG TERM GOAL #5   Title Pt will ambulate 533f with LRAD and mod I outdoors over unlevel surfaces, pavement, grass, gravel, and ramps to incr safety when working in his yard at home. (Target Date: 01/23/16)   Time 8   Period Weeks   Status On-going               Plan - 01/13/16 0930    Clinical Impression Statement Patient is on target to meet LTGs by end of next week. He has improved balance reactions with skilled care. He has improved hip, ankle & step strategies.    Rehab Potential Good   Clinical Impairments Affecting Rehab Potential HTN, OA, L ankle fx, Hard of hearing, DJD   PT Frequency 2x /  week   PT Duration 8 weeks   PT Treatment/Interventions ADLs/Self Care Home Management;Functional mobility training;Stair training;Gait training;DME Instruction;Therapeutic activities;Therapeutic exercise;Balance training;Neuromuscular re-education;Patient/family education;Manual techniques;Energy conservation   PT Next Visit Plan  community gait with cane including uneven terrain, negotiating around/over barriers and scanning; household without device   PT [place] Agree with Plan of Care Patient      Patient will benefit from skilled therapeutic intervention in order to improve the following deficits and impairments:  Abnormal gait, Decreased activity tolerance, Decreased balance, Decreased mobility, Decreased knowledge of use of DME, Decreased endurance, Decreased safety awareness, Decreased strength, Impaired perceived functional ability, Impaired flexibility, Postural dysfunction, Improper body mechanics, Pain  Visit Diagnosis: Unsteadiness on feet  Other abnormalities of gait and mobility  Muscle weakness (generalized)     Problem List Patient Active Problem List   Diagnosis Date Noted  . OSA (obstructive sleep apnea) 10/23/2015  . Upper airway cough syndrome 10/23/2015  . Exertional dyspnea 10/23/2015  . RLS (restless legs syndrome) 10/23/2015  . Osteoarthritis of knee 06/04/2010  . Dizziness 06/04/2010  . Benign hypertensive heart disease without heart failure 06/04/2010  . Hypercholesterolemia 06/04/2010  . BPH (benign prostatic hyperplasia) 06/04/2010    WJamey ReasPT, DPT 01/14/2016, 6:21 AM  [place] [address]Phone: [phone]  Fax:  [phone] Name: Randy SEKMRN: [id number] of Birth: [birth year]

## 2016-01-18 NOTE — Therapy (Signed)
[facility] [address] [place], [place], [address] Phone: [phone]   Fax:  [phone]  Physical Therapy Treatment  Patient Details  Name: Randy Sullivan MRN: [id number] Date of Birth: 1928-09-07 Referring Provider: Prince Solian, MD  Encounter Date: 01/14/2016     01/14/16 2208  PT Visits / Re-Eval  Visit Number 14  Number of Visits 17  Date for PT Re-Evaluation 01/23/16  Authorization  Authorization Type [employer]  Authorization Time Period G-Code every 10th visit  PT Time Calculation  PT Start Time 1022  PT Stop Time 1100  PT Time Calculation (min) 38 min  PT - End of Session  Equipment Utilized During Treatment Gait belt  Activity Tolerance Patient tolerated treatment well  Behavior During Therapy WFL for tasks assessed/performed    Past Medical History:  Diagnosis Date  . BPH (benign prostatic hyperplasia)   . DJD (degenerative joint disease)   . Familial tremor   . GERD (gastroesophageal reflux disease)   . Hyperlipidemia   . Hypertension   . Mood disorder ([place])   . Osteoarthritis   . Prostatism   . Shortness of breath   . Thyroid disease     Past Surgical History:  Procedure Laterality Date  . APPENDECTOMY    . HEMORROIDECTOMY      There were no vitals filed for this visit.     01/14/16 1020  Symptoms/Limitations  Subjective No falls. He is not having any issues.    Pertinent History Goes by "Randy Sullivan", Hypothyroidism, HTN, actinic keratosis, DJD, OA, tremor, L ankle fx  Limitations Walking;Standing;House hold activities  Patient Stated Goals "To get back to my normal way of doing things."  Pain Assessment  Currently in Pain? No/denies     Ambulation/Gait    Ambulation/Gait Yes   Ambulation/Gait Assistance 5: Supervision   Ambulation/Gait Assistance Details worked on gait outside on grass with head turns scanning    Ambulation Distance (Feet) 500 Feet   Assistive  device Straight cane   Ambulation Surface Indoor;Level;Outdoor;Paved;Gravel;Grass   Stairs Yes   Stairs Assistance 5: Supervision   Stairs Assistance Details (indicate cue type and reason) cues on ankle motion with descent   Stair Management Technique One rail Right;Alternating pattern;Forwards   Number of Stairs 4  2 reps   Ramp 5: Supervision  cane   Curb 5: Supervision  cane       High Level Balance   High Level Balance Activities Side stepping;Backward walking;Marching forwards;Marching backwards;Tandem walking;Head turns;Negotitating around obstacles   High Level Balance Comments verbal & tactile cues on balance reactions       Exercises   Exercises Knee/Hip       Knee/Hip Exercises: Stretches   Active Hamstring Stretch Right;Left;1 rep;30 seconds       Knee/Hip Exercises: Machines for Strengthening   Cybex Leg Press 75# BLE 15 reps with tactile cues for full ROM & control       Knee/Hip Exercises: Standing   Forward Step Up Right;Left;10 reps;Step Height: 2"  minA / tactile cues for balance reactions   Step Down Right;Left;10 reps;Step Height: 2"  minA / tactile cues for balance reactions                     Balance Exercises - 01/13/16 0845            Balance Exercises: Standing   Standing Eyes Opened Wide (BOA);Head turns;Foam/compliant surface;5 reps  head turns on foam beam, tactile &  visual for balance   Standing Eyes Closed Wide (BOA);Solid surface;5 reps  static 5 sec with tactile & head turns with minA   Stepping Strategy Anterior;Posterior;10 reps  alternate stepping on/off foam beam, holding 5 sec   Rockerboard Anterior/posterior;Lateral;EC;EO;Head turns;10 reps;Intermittent UE support  tactile cues for balance reactions, static EC, wt shift/hold   Step Ups Forward;2 inch;Intermittent UE support  LE on foam beam, tapping cone with contralateral LE   Balance Beam static EC, head turns rt/lt, up/down &  diagonals   Sidestepping 5 reps  over 2" X 4' beam   Turning Right;Left;10 reps   Step Over Hurdles / Cones alternate stepping over 2" X 4" beam, holding step forward position & pushing into upright posture   Marching Limitations marching in parallel bars forward & backward with intermittent UE support   Heel Raises Limitations light UE support for balance with toes on beam   Toe Raise Limitations light UE support for balance with heels on beam   Other Standing Exercises stepping right & left with weight shift & rotation progressed to stepping to change directions and walking .                                   01/14/16 2211  Plan  Clinical Impression Statement Patient has improved balance reactions with skilled care. He appears to have improved gait outdoors on uneven terrain with no balance losses including scanning.   Pt will benefit from skilled therapeutic intervention in order to improve on the following deficits Abnormal gait;Decreased activity tolerance;Decreased balance;Decreased mobility;Decreased knowledge of use of DME;Decreased endurance;Decreased safety awareness;Decreased strength;Impaired perceived functional ability;Impaired flexibility;Postural dysfunction;Improper body mechanics;Pain  Rehab Potential Good  Clinical Impairments Affecting Rehab Potential HTN, OA, L ankle fx, Hard of hearing, DJD  PT Frequency 2x / week  PT Duration 8 weeks  PT Treatment/Interventions ADLs/Self Care Home Management;Functional mobility training;Stair training;Gait training;DME Instruction;Therapeutic activities;Therapeutic exercise;Balance training;Neuromuscular re-education;Patient/family education;Manual techniques;Energy conservation  PT Next Visit Plan Begin to assess LTGs with discharge next week per plan of care, community gait with cane including uneven terrain, negotiating around/over barriers and scanning; household without device  PT [place] and Agree with Plan of Care Patient        PT Short Term Goals - 01/08/16 1337      PT SHORT TERM GOAL #1   Title Pt will be independent and verbalize understanding of initial HEP to continue progress made in therapy. (Target Date for all STGs: 12/26/15)   Baseline MET 12/23/2015   Time 4   Period Weeks   Status Achieved     PT SHORT TERM GOAL #2   Title Pt will improve Berg Balance score to > or = 35/56 to indicate an improvement in static balance andincr safety when performing ADLs. 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 (Target Date: 12/26/15)   Baseline 12/23/2015 improved but not met: 2.46f/sec without device & 1.764fsec with cane   Time 4   Period Weeks   Status Partially Met     PT SHORT TERM GOAL #4  Title Pt will improve TUG time to < or = 22.00 sec to improve functional mobility. (Target Date:12/26/15)   Baseline MET 12/23/2015 TUG 19.28sec   Time 4   Period Weeks   Status Achieved     PT SHORT TERM GOAL #5   Title Pt will ambulate 225f indoors over level surfaces, ramps, and curbs with supervision and LRAD to incr functional mobiliyt and incr safety when ambulating at home. (Target Date: 12/26/15)   Baseline met 01/08/2016   Time 4   Period Weeks   Status Achieved           PT Long Term Goals - 12/23/15 1605      PT LONG TERM GOAL #1   Title Pt will be independent and verbalize understanding of HEP and on-going fitness plan to maintain progress made in therapy and improve overall health. (Target Date for all LTGs: 01/23/16)   Time 8   Period Weeks   Status On-going     PT LONG TERM GOAL #2   Title Pt will improve Berg Balance score to > or = 40/56 to indicate decr risk of falls.  (Target Date: 01/23/16)   Time 8   Period Weeks   Status On-going     PT LONG TERM GOAL #3   Title Pt will improve TUG time to < or = 19 sec to indicate an incr in functional mobility and transfers. (01/23/16)   Time 8   Period Weeks   Status On-going     PT LONG TERM GOAL #4   Title Pt will negotiate 12 stairs with unilat UE support and mod I to incr safety when negotiating stairs at home. (Target Date: 01/23/16)   Time 8   Period Weeks   Status On-going     PT LONG TERM GOAL #5   Title Pt will ambulate 5019fwith LRAD and mod I outdoors over unlevel surfaces, pavement, grass, gravel, and ramps to incr safety when working in his yard at home. (Target Date: 01/23/16)   Time 8   Period Weeks   Status On-going             Patient will benefit from skilled therapeutic intervention in order to improve the following deficits and impairments:  Abnormal gait, Decreased activity tolerance, Decreased balance, Decreased mobility, Decreased knowledge of use of DME, Decreased endurance, Decreased safety awareness, Decreased strength, Impaired perceived functional ability, Impaired flexibility, Postural dysfunction, Improper body mechanics, Pain  Visit Diagnosis: Unsteadiness on feet  Other abnormalities of gait and mobility  Muscle weakness (generalized)     Problem List Patient Active Problem List   Diagnosis Date Noted  . OSA (obstructive sleep apnea) 10/23/2015  . Upper airway cough syndrome 10/23/2015  . Exertional dyspnea 10/23/2015  . RLS (restless legs syndrome) 10/23/2015  . Osteoarthritis of knee 06/04/2010  . Dizziness 06/04/2010  . Benign hypertensive heart disease without heart failure 06/04/2010  . Hypercholesterolemia 06/04/2010  . BPH (benign prostatic hyperplasia) 06/04/2010    WAJamey ReasT, DPT 01/18/2016, 10:13 PM  [id number]:  [phone] Fax:  [phone]: JaJAYZIAH BANKHEADRN: [id number] of Birth: 05/29/11/1930

## 2016-01-20 ENCOUNTER — Ambulatory Visit: Payer: MEDICARE | Admitting: Physical Therapy

## 2016-01-20 ENCOUNTER — Encounter: Payer: Self-pay | Admitting: Physical Therapy

## 2016-01-20 DIAGNOSIS — R2681 Unsteadiness on feet: Secondary | ICD-10-CM | POA: Diagnosis not present

## 2016-01-20 DIAGNOSIS — R2689 Other abnormalities of gait and mobility: Secondary | ICD-10-CM

## 2016-01-20 DIAGNOSIS — M6281 Muscle weakness (generalized): Secondary | ICD-10-CM

## 2016-01-21 ENCOUNTER — Ambulatory Visit (INDEPENDENT_AMBULATORY_CARE_PROVIDER_SITE_OTHER): Payer: MEDICARE | Admitting: Pulmonary Disease

## 2016-01-21 ENCOUNTER — Encounter: Payer: Self-pay | Admitting: Pulmonary Disease

## 2016-01-21 VITALS — BP 138/78 | HR 73 | Ht 72.0 in | Wt 187.0 lb

## 2016-01-21 DIAGNOSIS — G4733 Obstructive sleep apnea (adult) (pediatric): Secondary | ICD-10-CM | POA: Diagnosis not present

## 2016-01-21 DIAGNOSIS — R058 Other specified cough: Secondary | ICD-10-CM

## 2016-01-21 DIAGNOSIS — R05 Cough: Secondary | ICD-10-CM | POA: Diagnosis not present

## 2016-01-21 DIAGNOSIS — G2581 Restless legs syndrome: Secondary | ICD-10-CM

## 2016-01-21 DIAGNOSIS — J439 Emphysema, unspecified: Secondary | ICD-10-CM

## 2016-01-21 DIAGNOSIS — J449 Chronic obstructive pulmonary disease, unspecified: Secondary | ICD-10-CM | POA: Insufficient documentation

## 2016-01-21 MED ORDER — MONTELUKAST SODIUM 10 MG PO TABS: 10.0000 mg | ORAL_TABLET | Freq: Every day | ORAL | 6 refills | Status: DC

## 2016-01-21 MED ORDER — FLUTICASONE FUROATE-VILANTEROL 100-25 MCG/INH IN AEPB: 1.0000 | INHALATION_SPRAY | Freq: Every day | RESPIRATORY_TRACT | 5 refills | Status: DC

## 2016-01-21 MED ORDER — ALBUTEROL SULFATE HFA 108 (90 BASE) MCG/ACT IN AERS: 2.0000 | INHALATION_SPRAY | RESPIRATORY_TRACT | 2 refills | Status: DC | PRN

## 2016-01-21 NOTE — Progress Notes (Signed)
Subjective:    Patient ID: Randy Sullivan, male    DOB: 04/15/1928, 82 y.o.   MRN: [id number]  HPI   This is the case of Randy Sullivan, 82 y.o. Male, who was referred by Dr. Prince Solian  in consultation regarding OSA.   As you very well know, patient smoked cigarettes roughly x 20 yrs, on and off, maybe 1 PPD. He has not been dxed with asthma or COPD.   Patient complains of hypersomnia, snoring, witnessed apneas, occasional gasping or choking. He had an ONO 2-3 months ago which showed significant o2 desatn. He was started on o2 2 L at HS c/o [employer].   Pt had a sleep study on July 31, 2015. AHI was 7.5. CPAP was held off. PLMSI was elevated at 115. His legs dont bother him much.   Recent SOB with ADLs x 2 mos. (-) cough, cp.   Has not been admitted nor has been on abx recently.   ROV  01/21/16 Pt is here as follow up on his OSA and SOB. He has received his CPAP and feels better using it. More energy. Less sleepiness. Download the last month: 90%, AHI 12. Has leak  Issues. Breathing is better with Breo. Sinus congestion also better.  He is not using O2 with cpap > he randomly checks his O2 and it is usually 90%. Has not been admitted nor has been on abx since last seen.    Review of Systems  Constitutional: Negative.  Negative for fever and unexpected weight change.  HENT: Positive for congestion, rhinorrhea and sinus pressure. Negative for dental problem, ear pain, nosebleeds, postnasal drip, sneezing, sore throat and trouble swallowing.   Eyes: Negative.  Negative for redness and itching.  Respiratory: Positive for shortness of breath. Negative for cough, chest tightness and wheezing.   Cardiovascular: Negative.  Negative for palpitations and leg swelling.  Gastrointestinal: Negative.  Negative for nausea and vomiting.  Endocrine: Negative.   Genitourinary: Negative.  Negative for dysuria.  Musculoskeletal: Positive for arthralgias. Negative for joint swelling.  Skin: Negative.   Negative for rash.  Allergic/Immunologic: Negative.   Neurological: Negative.  Negative for headaches.  Hematological: Negative.  Does not bruise/bleed easily.  Psychiatric/Behavioral: Negative.  The patient is not nervous/anxious.        Objective:   Physical Exam  Vitals:  Vitals:   01/21/16 1031  BP: 138/78  Pulse: 73  SpO2: 96%  Weight: 187 lb (84.8 kg)  Height: 6' (1.829 m)    Constitutional/General:  Pleasant, well-nourished, well-developed, not in any distress,  Comfortably seating.  Well kempt  Body mass index is 25.36 kg/m. Wt Readings from Last 3 Encounters:  01/21/16 187 lb (84.8 kg)  12/18/15 186 lb (84.4 kg)  12/03/15 181 lb (82.1 kg)       HEENT: Pupils equal and reactive to light and accommodation. Anicteric sclerae. Normal nasal mucosa.   No oral  lesions,  mouth clear,  oropharynx clear, no postnasal drip. (-) Oral thrush. No dental caries.  Airway - Mallampati class III  Neck: No masses. Midline trachea. No JVD, (-) LAD. (-) bruits appreciated.  Respiratory/Chest: Grossly normal chest. (-) deformity. (-) Accessory muscle use.  Symmetric expansion. (-) Tenderness on palpation.  Resonant on percussion.  Diminished BS on both lower lung zones. (-) wheezing, crackles, rhonchi (-) egophony  Cardiovascular: Regular rate and  rhythm, heart sounds normal, no murmur or gallops, no peripheral edema  Gastrointestinal:  Normal bowel sounds. Soft, non-tender. No  hepatosplenomegaly.  (-) masses.   Musculoskeletal:  Normal muscle tone. Normal gait.   Extremities: Grossly normal. (-) clubbing, cyanosis.  (-) edema  Skin: (-) rash,lesions seen.   Neurological/Psychiatric : alert, oriented to time, place, person. Normal mood and affect          Assessment & Plan:  OSA (obstructive sleep apnea) Patient complains of hypersomnia, snoring, witnessed apneas, occasional gasping or choking. He had an ONO 2-3 months ago which showed significant o2  desatn. He was started on o2 2 L at HS c/o [employer].   Pt had a sleep study on July 31, 2015. AHI was 7.5. CPAP was held off. PLMSI was elevated at 115. His legs dont bother him much.   On autocpap 5-15 cm water.  Over all, feels better with cpap per wife.  Less sleepiness, snoring hypersomnia.  Feels benefit of cpap.  DL the last month : 90%, AHI 12 >> had leak issues. Hopefully better in 2-3 mos.   Plan :  We extensively discussed the importance of treating OSA and the need to use PAP therapy.   Continue with autocpap 5-15 cm water. Pt has nasal pillows and tolerating. Some mouth dryness. Will order chinstrap. Will need ONO on cpap > allegedly done last week.  Need to determine if we can d/c O2.  Needs DL for 02/2016.    Patient was instructed to have mask, tubings, filter, reservoir cleaned at least once a week with soapy water.  Patient was instructed to call the office if he/she is having issues with the PAP device.    I advised patient to obtain sufficient amount of sleep --  7 to 8 hours at least in a 24 hr period.  Patient was advised to follow good sleep hygiene.  Patient was advised NOT to engage in activities requiring concentration and/or vigilance if he/she is and  sleepy.  Patient is NOT to drive if he/she is sleepy.            COPD (chronic obstructive pulmonary disease) ([facility]) Mild COPD.  PFT  2.63  90%, DLCO 58% Feels better with Breo. Cont Breo for now. Alb prn. Vaccines UTD. Will need alpha one on f/u.   RLS (restless legs syndrome) Pt with RLS. PSG with eleveated PLMSI at 115. Started on Gabapentin at HS. On cpap. Plan to see if we can wean off gabapentin.  Will need ferritin checked, CBC, BMP on follow up.   Upper airway cough syndrome Pt with recent sinus congestion. Cont singulair. Cont flonase. Better      Patient will follow up with me in 3-4 months.     Monica Becton, MD 01/21/2016   1:10 PM [facility] [place] Pager: [phone] Office: [phone], Fax: [phone]

## 2016-01-21 NOTE — Assessment & Plan Note (Signed)
Pt with recent sinus congestion. Cont singulair. Cont flonase. Better

## 2016-01-21 NOTE — Assessment & Plan Note (Signed)
Pt with RLS. PSG with eleveated PLMSI at 115. Started on Gabapentin at HS. On cpap. Plan to see if we can wean off gabapentin.  Will need ferritin checked, CBC, BMP on follow up.

## 2016-01-21 NOTE — Patient Instructions (Signed)
   It was a pleasure taking care of you today!  Continue using your CPAP machine.   Please make sure you use your CPAP device everytime you sleep.  We will monitor the usage of your machine per your insurance requirement.  Your insurance company may take the machine from you if you are not using it regularly.   Please clean the mask, tubings, filter, water reservoir with soapy water every week.  Please use distilled water for the water reservoir.   Please call the office or your machine provider (DME company) if you are having issues with the device.   You are diagnosed with Chronic Obstructive Pulmonary Disease or COPD.  COPD is a preventable and treatable disease that makes it difficult to empty air out of the lungs (airflow obstruction).  This can lead to shortness of breath.   Sometimes, when you have a lung infection, this can make your breathing worse, and will cause you to have a COPD flare-up or an acute exacerbation of COPD. Please call your primary care doctor or the office if you are having a COPD flare-up.   Smoking makes COPD worse.   Make sure you use your medications for COPD -- Maintenance medications : Breo 100 mcg 1 puff daily  Rescue medications: Albuterol 2 puffs every 4 hours as needed for shortness of breath.   Please rinse your mouth each time you use your maintenance medication.  Please call the office if you are having issues with your medications   Return to clinic in 4 months    with Dr. Corrie Dandy

## 2016-01-21 NOTE — Assessment & Plan Note (Signed)
Mild COPD.  PFT  2.63  90%, DLCO 58% Feels better with Breo. Cont Breo for now. Alb prn. Vaccines UTD. Will need alpha one on f/u.

## 2016-01-21 NOTE — Assessment & Plan Note (Addendum)
Patient complains of hypersomnia, snoring, witnessed apneas, occasional gasping or choking. He had an ONO 2-3 months ago which showed significant o2 desatn. He was started on o2 2 L at HS c/o [employer].   Pt had a sleep study on July 31, 2015. AHI was 7.5. CPAP was held off. PLMSI was elevated at 115. His legs dont bother him much.   On autocpap 5-15 cm water.  Over all, feels better with cpap per wife.  Less sleepiness, snoring hypersomnia.  Feels benefit of cpap.  DL the last month : 90%, AHI 12 >> had leak issues. Hopefully better in 2-3 mos.   Plan :  We extensively discussed the importance of treating OSA and the need to use PAP therapy.   Continue with autocpap 5-15 cm water. Pt has nasal pillows and tolerating. Some mouth dryness. Will order chinstrap. Will need ONO on cpap > allegedly done last week.  Need to determine if we can d/c O2.  Needs DL for 02/2016.    Patient was instructed to have mask, tubings, filter, reservoir cleaned at least once a week with soapy water.  Patient was instructed to call the office if he/she is having issues with the PAP device.    I advised patient to obtain sufficient amount of sleep --  7 to 8 hours at least in a 24 hr period.  Patient was advised to follow good sleep hygiene.  Patient was advised NOT to engage in activities requiring concentration and/or vigilance if he/she is and  sleepy.  Patient is NOT to drive if he/she is sleepy.

## 2016-01-21 NOTE — Therapy (Signed)
[facility] [address] [place], [place], [address] Phone: [phone]   Fax:  [phone]  Physical Therapy Treatment  Patient Details  Name: Randy Sullivan MRN: [id number] Date of Birth: 1928/06/20 Referring Provider: Prince Solian, MD  Encounter Date: 01/20/2016      PT End of Session - 01/20/16 1015    Visit Number 15   Number of Visits 17   Date for PT Re-Evaluation 01/23/16   Authorization Type Medicare [employer]   Authorization Time Period G-Code every 10th visit   PT Start Time 0931   PT Stop Time 1014   PT Time Calculation (min) 43 min   Equipment Utilized During Treatment Gait belt   Activity Tolerance Patient tolerated treatment well   Behavior During Therapy [facility] for tasks assessed/performed      Past Medical History:  Diagnosis Date  . BPH (benign prostatic hyperplasia)   . DJD (degenerative joint disease)   . Familial tremor   . GERD (gastroesophageal reflux disease)   . Hyperlipidemia   . Hypertension   . Mood disorder ([place])   . Osteoarthritis   . Prostatism   . Shortness of breath   . Thyroid disease     Past Surgical History:  Procedure Laterality Date  . APPENDECTOMY    . HEMORROIDECTOMY      There were no vitals filed for this visit.      Subjective Assessment - 01/20/16 0942    Subjective No falls. He walked to neighbors for Thanksgiving with no issues.    Patient is accompained by: Family member   Pertinent History Goes by "Randy Sullivan", Hypothyroidism, HTN, actinic keratosis, DJD, OA, tremor, L ankle fx   Limitations Walking;Standing;House hold activities   Patient Stated Goals "To get back to my normal way of doing things."   Currently in Pain? No/denies            [facility] PT Assessment - 01/20/16 0930      Ambulation/Gait   Gait velocity 3.04 ft/sec comfortable, 3.41 ft/sec fast  2.19 ft/sec initially comfortable     Berg Balance Test   Sit to Stand Able to stand  without using hands and stabilize independently   Standing Unsupported Able to stand safely 2 minutes   Sitting with Back Unsupported but Feet Supported on Floor or Stool Able to sit safely and securely 2 minutes   Stand to Sit Sits safely with minimal use of hands   Transfers Able to transfer safely, minor use of hands   Standing Unsupported with Eyes Closed Able to stand 10 seconds safely   Standing Ubsupported with Feet Together Able to place feet together independently and stand 1 minute safely   From Standing, Reach Forward with Outstretched Arm Can reach forward >12 cm safely (5")   From Standing Position, Pick up Object from Floor Able to pick up shoe safely and easily   From Standing Position, Turn to Look Behind Over each Shoulder Looks behind from both sides and weight shifts well   Turn 360 Degrees Able to turn 360 degrees safely but slowly   Standing Unsupported, Alternately Place Feet on Step/Stool Able to stand independently and safely and complete 8 steps in 20 seconds   Standing Unsupported, One Foot in Front Able to plae foot ahead of the other independently and hold 30 seconds   Standing on One Leg Tries to lift leg/unable to hold 3 seconds but remains standing independently   Total Score 49   Randy Sullivan  comment: Initial was 32/56     Timed Up and Go Test   Normal TUG (seconds) 13.03  no device, Initial was 25.0sec                     [facility] Adult PT Treatment/Exercise - 01/20/16 0930      Ambulation/Gait   Ambulation/Gait Yes   Ambulation/Gait Assistance 5: Supervision   Ambulation/Gait Assistance Details cues on use of cane for longer distances & uneven terrain but appears safe for househould & limited community.    Ambulation Distance (Feet) 500 Feet   Assistive device None   Ambulation Surface Indoor;Level   Stairs Yes   Stairs Assistance 6: Modified independent (Device/Increase time)   Stair Management Technique One rail Right;Alternating pattern;Forwards    Number of Stairs 4                PT Education - 01/20/16 0930    Education provided Yes   Education Details ongoing fitness program / HEP including flexibility, strength, balance & endurance   Person(s) Educated Patient;Spouse   Methods Explanation;Verbal cues   Comprehension Verbalized understanding          PT Short Term Goals - 01/08/16 1337      PT SHORT TERM GOAL #1   Title Pt will be independent and verbalize understanding of initial HEP to continue progress made in therapy. (Target Date for all STGs: 12/26/15)   Baseline MET 12/23/2015   Time 4   Period Weeks   Status Achieved     PT SHORT TERM GOAL #2   Title Pt will improve Berg Balance score to > or = 35/56 to indicate an improvement in static balance andincr safety when performing ADLs. 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 (Target Date: 12/26/15)   Baseline 12/23/2015 improved but not met: 2.24f/sec without device & 1.790fsec with cane   Time 4   Period Weeks   Status Partially Met     PT SHORT TERM GOAL #4   Title Pt will improve TUG time to < or = 22.00 sec to improve functional mobility. (Target Date:12/26/15)   Baseline MET 12/23/2015 TUG 19.28sec   Time 4   Period Weeks   Status Achieved     PT SHORT TERM GOAL #5   Title Pt will ambulate 25032fndoors over level surfaces, ramps, and curbs with supervision and LRAD to incr functional mobiliyt and incr safety when ambulating at home. (Target Date: 12/26/15)   Baseline met 01/08/2016   Time 4   Period Weeks   Status Achieved           PT Long Term Goals - 01/20/16 1015      PT LONG TERM GOAL #1   Title Pt will be independent and verbalize understanding of HEP and on-going fitness plan  to maintain progress made in therapy and improve overall health. (Target Date for all LTGs: 01/23/16)   Time 8   Period Weeks   Status On-going     PT LONG TERM GOAL #2   Title Pt will improve Berg Balance score to > or = 40/56 to indicate decr risk of falls. (Target Date:  01/23/16)   Baseline MET 01/20/2016 Randy Sullivan Balance 49/56   Time 8   Period Weeks   Status Achieved     PT LONG TERM GOAL #3   Title Pt will improve TUG time to < or = 19 sec to indicate an incr in functional mobility and transfers. (01/23/16)   Baseline MET 01/20/2016 TUG 13.03sec   Time 8   Period Weeks   Status Achieved     PT LONG TERM GOAL #4   Title Pt will negotiate 12 stairs with unilat UE support and mod I to incr safety when negotiating stairs at home. (Target Date: 01/23/16)   Time 8   Period Weeks   Status On-going     PT LONG TERM GOAL #5   Title Pt will ambulate 554f with LRAD and mod I outdoors over unlevel surfaces, pavement, grass, gravel, and ramps to incr safety when working in his yard at home. (Target Date: 01/23/16)   Time 8   Period Weeks   Status On-going               Plan - 01/20/16 1800    Clinical Impression Statement Patient met 2 of LTGs today and anticipate meeting remainder next visit for planned discharge. He reports his mobility has improved and understands need to continue exercising to get benefits.    Rehab Potential Good   Clinical Impairments Affecting Rehab Potential HTN, OA, L ankle fx, Hard of hearing, DJD   PT Frequency 2x / week   PT Duration 8 weeks   PT Treatment/Interventions ADLs/Self Care Home Management;Functional mobility training;Stair training;Gait training;DME Instruction;Therapeutic activities;Therapeutic exercise;Balance training;Neuromuscular re-education;Patient/family education;Manual techniques;Energy conservation   PT Next Visit Plan check remaining LTGs, Do FOTO, review & answer questions on HEP / fitness plan   PT HMontroseand Agree with Plan of Care Patient      Patient will benefit from skilled therapeutic intervention in order to improve the following deficits and impairments:  Abnormal gait, Decreased activity tolerance, Decreased balance, Decreased mobility, Decreased knowledge of use of DME, Decreased endurance, Decreased safety awareness, Decreased strength, Impaired perceived functional ability, Impaired flexibility, Postural dysfunction, Improper body mechanics, Pain  Visit Diagnosis: Unsteadiness on feet  Other abnormalities of gait and mobility  Muscle weakness (generalized)     Problem List Patient Active Problem List   Diagnosis Date Noted  . OSA (obstructive sleep apnea) 10/23/2015  . Upper airway cough syndrome 10/23/2015  . Exertional dyspnea 10/23/2015  . RLS (restless legs syndrome) 10/23/2015  . Osteoarthritis of knee 06/04/2010  . Dizziness 06/04/2010  . Benign hypertensive heart disease without heart failure 06/04/2010  . Hypercholesterolemia 06/04/2010  . BPH (benign prostatic hyperplasia) 06/04/2010    WJamey ReasPT, DPT 01/21/2016, 6:31 AM  [place] [address]Phone: [phone]  Fax:  [phone] Name: Randy KOEPPENMRN: [id number] of Birth: [birth year]

## 2016-01-22 ENCOUNTER — Encounter: Payer: Self-pay | Admitting: Physical Therapy

## 2016-01-22 ENCOUNTER — Ambulatory Visit: Payer: MEDICARE | Admitting: Physical Therapy

## 2016-01-22 DIAGNOSIS — M6281 Muscle weakness (generalized): Secondary | ICD-10-CM | POA: Diagnosis not present

## 2016-01-22 DIAGNOSIS — R2689 Other abnormalities of gait and mobility: Secondary | ICD-10-CM

## 2016-01-22 DIAGNOSIS — R2681 Unsteadiness on feet: Secondary | ICD-10-CM | POA: Diagnosis not present

## 2016-01-22 NOTE — Therapy (Signed)
[facility] [address] [place], [place], [address] Phone: [phone]   Fax:  [phone]  Physical Therapy Treatment  Patient Details  Name: Randy Sullivan MRN: [id number] Date of Birth: [birth year] Referring Provider: Prince Solian, MD  Encounter Date: 01/22/2016      PT End of Session - 01/22/16 2114    Visit Number 16   Number of Visits 17   Date for PT Re-Evaluation 01/23/16   Authorization Type Medicare [employer]   Authorization Time Period G-Code every 10th visit   PT Start Time 0930   PT Stop Time 1014   PT Time Calculation (min) 44 min   Equipment Utilized During Treatment Gait belt   Activity Tolerance Patient tolerated treatment well   Behavior During Therapy WFL for tasks assessed/performed      Past Medical History:  Diagnosis Date  . BPH (benign prostatic hyperplasia)   . DJD (degenerative joint disease)   . Familial tremor   . GERD (gastroesophageal reflux disease)   . Hyperlipidemia   . Hypertension   . Mood disorder ([place])   . Osteoarthritis   . Prostatism   . Shortness of breath   . Thyroid disease     Past Surgical History:  Procedure Laterality Date  . APPENDECTOMY    . HEMORROIDECTOMY      There were no vitals filed for this visit.      Subjective Assessment - 01/22/16 0931    Subjective No falls.    Pertinent History Goes by "[name]", Hypothyroidism, HTN, actinic keratosis, DJD, OA, tremor, L ankle fx   Limitations Walking;Standing;House hold activities   Patient Stated Goals "To get back to my normal way of doing things."   Currently in Pain? No/denies            [facility] PT Assessment - 01/22/16 0930      Observation/Other Assessments   Focus on Therapeutic Outcomes (FOTO)  59.92 Functional Status  Initial FS 53.53   Activities of Balance Confidence Scale (ABC Scale)  78.8%  Initial ABC 43.1%   Fear Avoidance Belief Questionnaire (FABQ)  19 (5)  Initial fear 60  (16)     Ambulation/Gait   Ambulation/Gait Yes   Ambulation/Gait Assistance 6: Modified independent (Device/Increase time)   Ambulation/Gait Assistance Details basic community up to 200' without device and full community 500' with cane   Ambulation Distance (Feet) 550 Feet  550' outdoors & 200' indoors   Assistive device Straight cane;None   Gait Pattern Within Functional Limits   Ambulation Surface Indoor;Level;Outdoor;Paved;Gravel;Grass   Gait velocity 3.04 ft/sec comfortable, 3.41 ft/sec fast   Stairs Yes   Stairs Assistance 6: Modified independent (Device/Increase time)   Stair Management Technique One rail Right;Alternating pattern;Forwards   Number of Stairs 12   Ramp 6: Modified independent (Device)  no device & cane   Curb 6: Modified independent (Device/increase time)  no device & cane     Functional Gait  Assessment   Gait assessed  Yes   Gait Level Surface Walks 20 ft in less than 7 sec but greater than 5.5 sec, uses assistive device, slower speed, mild gait deviations, or deviates 6-10 in outside of the 12 in walkway width.   Change in Gait Speed Able to change speed, demonstrates mild gait deviations, deviates 6-10 in outside of the 12 in walkway width, or no gait deviations, unable to achieve a major change in velocity, or uses a change in velocity, or uses an assistive device.  Gait with Horizontal Head Turns Performs head turns smoothly with slight change in gait velocity (eg, minor disruption to smooth gait path), deviates 6-10 in outside 12 in walkway width, or uses an assistive device.   Gait with Vertical Head Turns Performs task with slight change in gait velocity (eg, minor disruption to smooth gait path), deviates 6 - 10 in outside 12 in walkway width or uses assistive device   Gait and Pivot Turn Pivot turns safely in greater than 3 sec and stops with no loss of balance, or pivot turns safely within 3 sec and stops with mild imbalance, requires small steps to catch  balance.   Step Over Obstacle Is able to step over one shoe box (4.5 in total height) but must slow down and adjust steps to clear box safely. May require verbal cueing.   Gait with Narrow Base of Support Ambulates less than 4 steps heel to toe or cannot perform without assistance.   Gait with Eyes Closed Walks 20 ft, uses assistive device, slower speed, mild gait deviations, deviates 6-10 in outside 12 in walkway width. Ambulates 20 ft in less than 9 sec but greater than 7 sec.   Ambulating Backwards Walks 20 ft, uses assistive device, slower speed, mild gait deviations, deviates 6-10 in outside 12 in walkway width.   Steps Alternating feet, must use rail.   Total Score 17                               PT Short Term Goals - 01/08/16 1337      PT SHORT TERM GOAL #1   Title Pt will be independent and verbalize understanding of initial HEP to continue progress made in therapy. (Target Date for all STGs: 12/26/15)   Baseline MET 12/23/2015   Time 4   Period Weeks   Status Achieved     PT SHORT TERM GOAL #2   Title Pt will improve Berg Balance score to > or = 35/56 to indicate an improvement in static balance andincr safety when performing ADLs. 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 (Target Date: 12/26/15)   Baseline 12/23/2015 improved but not met: 2.39f/sec without device & 1.775fsec with cane   Time 4   Period Weeks   Status Partially Met     PT SHORT TERM GOAL #4   Title Pt will improve TUG time to < or = 22.00 sec to improve functional mobility. (Target Date:12/26/15)   Baseline MET 12/23/2015 TUG 19.28sec   Time 4   Period Weeks   Status Achieved     PT SHORT TERM GOAL #5   Title Pt will ambulate 25023findoors over level surfaces, ramps, and curbs with supervision and LRAD to incr functional mobiliyt and incr safety when ambulating at home. (Target Date: 12/26/15)   Baseline met 01/08/2016   Time 4   Period Weeks   Status Achieved           PT Long Term Goals - 01/22/16 2114      PT LONG TERM GOAL #1   Title Pt will be independent and verbalize understanding of  HEP and on-going fitness plan to maintain progress made in therapy and improve overall health. (Target Date for all LTGs: 01/23/16)   Baseline MET 01/24/16   Time 8   Period Weeks   Status Achieved     PT LONG TERM GOAL #2   Title Pt will improve Berg Balance score to > or = 40/56 to indicate decr risk of falls. (Target Date: 01/23/16)   Baseline MET 01/20/2016 Merrilee Jansky Balance 49/56   Time 8   Period Weeks   Status Achieved     PT LONG TERM GOAL #3   Title Pt will improve TUG time to < or = 19 sec to indicate an incr in functional mobility and transfers. (01/23/16)   Baseline MET 01/20/2016 TUG 13.03sec   Time 8   Period Weeks   Status Achieved     PT LONG TERM GOAL #4   Title Pt will negotiate 12 stairs with unilat UE support and mod I to incr safety when negotiating stairs at home. (Target Date: 01/23/16)   Baseline MET 01/24/2016   Time 8   Period Weeks   Status Achieved     PT LONG TERM GOAL #5   Title Pt will ambulate 564f with LRAD and mod I outdoors over unlevel surfaces, pavement, grass, gravel, and ramps to incr safety when working in his yard at home. (Target Date: 01/23/16)   Baseline MET 112/03/2015  Time 8   Period Weeks   Status Achieved               Plan - 12017-12-022February 14, 2116   Clinical Impression Statement Patient met all LTGs. He appears to be safely functioning without device at household & basic community activities and cane for full community activities.    Rehab Potential Good   Clinical Impairments Affecting Rehab Potential HTN, OA, L ankle fx, Hard of hearing, DJD   PT Frequency  2x / week   PT Duration 8 weeks   PT Treatment/Interventions ADLs/Self Care Home Management;Functional mobility training;Stair training;Gait training;DME Instruction;Therapeutic activities;Therapeutic exercise;Balance training;Neuromuscular re-education;Patient/family education;Manual techniques;Energy conservation   PT Next Visit Plan discharge   PT Home Exercise Plan [place] HEP   Consulted and Agree with Plan of Care Patient      Patient will benefit from skilled therapeutic intervention in order to improve the following deficits and impairments:  Abnormal gait, Decreased activity tolerance, Decreased balance, Decreased mobility, Decreased knowledge of use of DME, Decreased endurance, Decreased safety awareness, Decreased strength, Impaired perceived functional ability, Impaired flexibility, Postural dysfunction, Improper body mechanics, Pain, Decreased skin integrity  Visit Diagnosis: Unsteadiness on feet  Other abnormalities of gait and mobility  Muscle weakness (generalized)       G-Codes - 112/02/20172February 14, 2126   Functional Assessment Tool Used Berg Balance 49/56 & Timed Up-Go 13.03sec   Functional Limitation Mobility: Walking and moving around   Mobility: Walking and Moving Around Goal Status [phone] At least 20 percent but less than 40 percent impaired, limited or restricted   Mobility: Walking and Moving Around Discharge Status [phone] At least 20 percent but less than 40 percent impaired, limited or restricted      Problem List Patient Active Problem List   Diagnosis Date Noted  . COPD (chronic obstructive pulmonary disease) ([place] 01/21/2016  . OSA (obstructive sleep apnea) 10/23/2015  . Upper airway cough syndrome 10/23/2015  . Exertional dyspnea 10/23/2015  . RLS (restless legs syndrome) 10/23/2015  . Osteoarthritis of knee 06/04/2010  . Dizziness 06/04/2010  .  Benign hypertensive heart disease without heart failure 06/04/2010  . Hypercholesterolemia 06/04/2010  . BPH  (benign prostatic hyperplasia) 06/04/2010   PHYSICAL THERAPY DISCHARGE SUMMARY  Visits from Start of Care: 16  Current functional level related to goals / functional outcomes: See above   Remaining deficits: See above   Education / Equipment: HEP/ ongoing HEP  Plan: Patient agrees to discharge.  Patient goals were met. Patient is being discharged due to meeting the stated rehab goals.  ?????         Keajah Killough PT, DPT 01/22/2016, 9:28 PM  [facility] [address] [place], [place], [address] Phone: [phone]   Fax:  [phone]  Name: Randy Sullivan MRN: [id number] Date of Birth: 1928/11/27

## 2016-01-27 ENCOUNTER — Ambulatory Visit: Payer: MEDICARE | Admitting: Physical Therapy

## 2016-01-28 ENCOUNTER — Telehealth: Payer: Self-pay | Admitting: Pulmonary Disease

## 2016-01-28 NOTE — Telephone Encounter (Signed)
Spoke with pt and his wife. They wanted to know where they were to get a new chin strap for his CPAP machine. Advised them that we sent an order to [facility] back in November and to contact [facility] about getting this item. While on the phone the pt's wife told me that the pt is not using oxygen through his CPAP and they wanted to have the oxygen picked up. States that pt had an ONO and they never received the results from this.  AD - please advise if pt needs to continue oxygen use. Thanks.

## 2016-01-29 ENCOUNTER — Ambulatory Visit: Payer: MEDICARE | Admitting: Physical Therapy

## 2016-01-29 DIAGNOSIS — N3941 Urge incontinence: Secondary | ICD-10-CM | POA: Diagnosis not present

## 2016-02-03 ENCOUNTER — Ambulatory Visit: Payer: MEDICARE | Admitting: Physical Therapy

## 2016-02-05 ENCOUNTER — Ambulatory Visit: Payer: MEDICARE | Admitting: Physical Therapy

## 2016-02-05 ENCOUNTER — Ambulatory Visit (INDEPENDENT_AMBULATORY_CARE_PROVIDER_SITE_OTHER): Payer: MEDICARE | Admitting: Cardiovascular Disease

## 2016-02-05 ENCOUNTER — Encounter: Payer: Self-pay | Admitting: Cardiovascular Disease

## 2016-02-05 VITALS — BP 118/80 | HR 74 | Ht 72.0 in | Wt 185.0 lb

## 2016-02-05 DIAGNOSIS — E7800 Pure hypercholesterolemia, unspecified: Secondary | ICD-10-CM | POA: Diagnosis not present

## 2016-02-05 DIAGNOSIS — G250 Essential tremor: Secondary | ICD-10-CM | POA: Insufficient documentation

## 2016-02-05 DIAGNOSIS — I119 Hypertensive heart disease without heart failure: Secondary | ICD-10-CM | POA: Diagnosis not present

## 2016-02-05 DIAGNOSIS — I10 Essential (primary) hypertension: Secondary | ICD-10-CM

## 2016-02-05 DIAGNOSIS — I700 Atherosclerosis of aorta: Secondary | ICD-10-CM | POA: Diagnosis not present

## 2016-02-05 NOTE — Progress Notes (Signed)
Cardiology Office Note    Date:  02/05/2016   ID:  Randy Sullivan, DOB [birth year], MRN [id number]  PCP:  Tivis Ringer, MD  Cardiologist:   Sanda Klein, MD   Chief Complaint  Patient presents with  . Follow-up    PT C/O DOE    History of Present Illness:  Randy Sullivan is a 82 y.o. male with long-standing hypertension and hyperlipidemia, mild obstructive sleep apnea, treated hypothyroidism returning in follow-up.  He generally feels well. Shortness of breath is not a common complaint. He does not have angina pectoris. He denies palpitations or syncope. He is unaware of the fact that he has PACs today. Tremor has improved with propranolol therapy. Let pressure was slightly elevated (150/90) on a day when he was feeling poorly, but typically it is very well controlled.  One year ago he underwent a treadmill stress test and was only able to exercise for 3 minutes and 10 seconds, but did not have any abnormal ECG changes or chest discomfort. He was easily able to achieve a heart rate equal to maximum predicted for his age. An echocardiogram performed just last month showed normal left ventricular ejection fraction 55-60%, with moderate LVH and grade 1 diastolic dysfunction (E/e' 9.5), normal size left atrium. There was no evidence of aortic stenosis and aortic root was only borderline dilated. Pulmonary function tests showed mild COPD (Dr. Corrie Dandy). Sleep study showed mild obstructive sleep apnea (Dr. Rexene Alberts).    Past Medical History:  Diagnosis Date  . BPH (benign prostatic hyperplasia)   . DJD (degenerative joint disease)   . Familial tremor   . GERD (gastroesophageal reflux disease)   . Hyperlipidemia   . Hypertension   . Mood disorder ([place])   . Osteoarthritis   . Prostatism   . Shortness of breath   . Thyroid disease     Past Surgical History:  Procedure Laterality Date  . APPENDECTOMY    . HEMORROIDECTOMY      Current Medications: Outpatient Medications Prior  to Visit  Medication Sig Dispense Refill  . albuterol (PROVENTIL HFA;VENTOLIN HFA) 108 (90 Base) MCG/ACT inhaler Inhale 2 puffs into the lungs every 4 (four) hours as needed for wheezing or shortness of breath. 1 Inhaler 2  . aspirin 81 MG tablet Take 81 mg by mouth daily.      . Cholecalciferol (VITAMIN D) 1000 UNITS capsule Take 2,000 Units by mouth daily.      . fluticasone (FLONASE) 50 MCG/ACT nasal spray Place 1 spray into both nostrils daily.    . fluticasone furoate-vilanterol (BREO ELLIPTA) 100-25 MCG/INH AEPB Inhale 1 puff into the lungs daily. 1 each 5  . gabapentin (NEURONTIN) 100 MG capsule Take 2 capsules (200 mg total) by mouth at bedtime. 180 capsule 3  . levothyroxine (SYNTHROID, LEVOTHROID) 112 MCG tablet Take 112 mcg by mouth daily before breakfast.     . montelukast (SINGULAIR) 10 MG tablet Take 1 tablet (10 mg total) by mouth at bedtime. 30 tablet 6  . MYRBETRIQ 50 MG TB24 tablet     . propranolol (INDERAL) 10 MG tablet Take 10 mg by mouth 2 (two) times daily.     . vitamin B-12 (CYANOCOBALAMIN) 1000 MCG tablet Take 1,200 mcg by mouth daily.     No facility-administered medications prior to visit.      Allergies:   Patient has no known allergies.   Social History   Social History  . Marital status: Married    Spouse name:  N/A  . Number of children: N/A  . Years of education: N/A   Occupational History  . retired    Social History Main Topics  . Smoking status: Former Smoker    Packs/day: 2.00    Years: 18.00    Types: Cigarettes    Quit date: 06/04/1970  . Smokeless tobacco: Never Used  . Alcohol use 0.0 oz/week  . Drug use: No  . Sexual activity: Not Asked   Other Topics Concern  . None   Social History Narrative   Rare caffeine use      Family History:  The patient's family history includes Colon cancer in his mother and sister; Heart attack in his father.   ROS:   Please see the history of present illness.    ROS All other systems reviewed and  are negative.   PHYSICAL EXAM:   VS:  BP 118/80   Pulse 74   Ht 6' (1.829 m)   Wt 185 lb (83.9 kg)   BMI 25.09 kg/m    GEN: Well nourished, well developed, in no acute distress  HEENT: normal  Neck: no JVD, carotid bruits, or masses Cardiac: RRR; no murmurs, rubs, or gallops,no edema  Respiratory:  clear to auscultation bilaterally, normal work of breathing GI: soft, nontender, nondistended, + BS MS: no deformity or atrophy  Skin: warm and dry, no rash Neuro:  Alert and Oriented x 3, Strength and sensation are intact Psych: euthymic mood, full affect  Wt Readings from Last 3 Encounters:  02/05/16 185 lb (83.9 kg)  01/21/16 187 lb (84.8 kg)  12/18/15 186 lb (84.4 kg)      Studies/Labs Reviewed:   EKG:  EKG is ordered today.  The ekg ordered today demonstrates Sinus rhythm with bigeminal PACs and a mild prolongation of the PR interval (216 ms). A single wide complex beat is seen most likely a PAC with aberrancy.  ASSESSMENT:    1. Benign hypertensive heart disease without heart failure   2. Essential hypertension   3. Hypercholesterolemia   4. Thoracic aortic atherosclerosis ([place])   5. Benign familial tremor      PLAN:  In order of problems listed above:  1. Diast dysf: Without overt congestive heart failure medically and without echo evidence of elevated filling pressures. Note that the left atrium is not dilated. 2. HTN: Generally with excellent control. Does have LVH on echo. Does seem to have better exercise tolerance. Did not have evidence of chronotropic incompetence on treadmill testing. On the contrary, heart rate response was exaggerated suggesting deconditioning. 3.  4. HLP: Spike markedly elevated LDL cholesterol levels he does not have established coronary disease or other serious vascular problems. Aortic atherosclerosis is seen on imaging studies (CT chest 2011). He prefers not to take medication. 5. Ao atherosclerosis:  No history of TIA or stroke. Taking  aspirin. 6. Essential tremor: Improved with beta blocker.    Medication Adjustments/Labs and Tests Ordered: Current medicines are reviewed at length with the patient today.  Concerns regarding medicines are outlined above.  Medication changes, Labs and Tests ordered today are listed in the Patient Instructions below. Patient Instructions  Dr Sallyanne Kuster recommends that you schedule a follow-up appointment in 12 months. You will receive a reminder letter in the mail two months in advance. If you don't receive a letter, please call our office to schedule the follow-up appointment.  If you need a refill on your cardiac medications before your next appointment, please call your pharmacy.  Signed, Sanda Klein, MD  02/05/2016 10:49 AM    [facility] [address], [place], [place]  [address] Phone: [phone]; Fax: [phone]

## 2016-02-05 NOTE — Patient Instructions (Signed)
Dr Croitoru recommends that you schedule a follow-up appointment in 12 months. You will receive a reminder letter in the mail two months in advance. If you don't receive a letter, please call our office to schedule the follow-up appointment.  If you need a refill on your cardiac medications before your next appointment, please call your pharmacy. 

## 2016-02-06 NOTE — Telephone Encounter (Signed)
AD please advise thanks 

## 2016-02-17 NOTE — Telephone Encounter (Signed)
Dr. Corrie Dandy please advise.  Thanks!

## 2016-02-19 DIAGNOSIS — R350 Frequency of micturition: Secondary | ICD-10-CM | POA: Diagnosis not present

## 2016-02-19 DIAGNOSIS — N3941 Urge incontinence: Secondary | ICD-10-CM | POA: Diagnosis not present

## 2016-02-19 NOTE — Telephone Encounter (Signed)
   SORRY to answer just now as I was on leave for 2 weeks until Dec 28th.  Based on my last note, ONO was done on cpap and RA a week before I saw him last.  I asked staff to follow up on result. Can we pls follow up result from DME?  Pls tell pt we are following up result and based on that we will determine if O2 can be discontinued or not.   Pls make sure the chinstrap has also been ordered.   Thanks.  Anything else?  J. Shirl Harris, MD 02/19/2016, 12:42 AM [facility] Pager [phone] After 3 pm or if no answer, call [phone]

## 2016-02-19 NOTE — Telephone Encounter (Signed)
I have spoke with Randy Sullivan with [facility], who states she will fax over ONO results. Once we have received these results, they will be placed in AD's folder to be reviewed.  I have made pt aware of this. Pt voiced his understanding and had no further questions. Will hold this message in triage until we have received this

## 2016-02-20 NOTE — Telephone Encounter (Signed)
I checked both faxes and also AD's lookat and see nothing on this pt  Called [facility] and spoke with Bethanne Ginger and asked that they fax this asap  She states I need to call the [facility] office, as they do not have ONO on this pt  I called [place] [phone] and after waiting for 7 minutes spoke with Manuela Schwartz \ She states will fax the results to the up front fax  Will continue to await fax

## 2016-02-24 NOTE — Telephone Encounter (Signed)
DL printed and placed in AD's lookat (blue folder)

## 2016-02-25 DIAGNOSIS — G4733 Obstructive sleep apnea (adult) (pediatric): Secondary | ICD-10-CM | POA: Diagnosis not present

## 2016-02-25 DIAGNOSIS — Z8719 Personal history of other diseases of the digestive system: Secondary | ICD-10-CM | POA: Diagnosis not present

## 2016-02-25 DIAGNOSIS — G2581 Restless legs syndrome: Secondary | ICD-10-CM | POA: Diagnosis not present

## 2016-02-25 DIAGNOSIS — I700 Atherosclerosis of aorta: Secondary | ICD-10-CM | POA: Diagnosis not present

## 2016-02-25 DIAGNOSIS — Z6825 Body mass index (BMI) 25.0-25.9, adult: Secondary | ICD-10-CM | POA: Diagnosis not present

## 2016-02-25 DIAGNOSIS — I10 Essential (primary) hypertension: Secondary | ICD-10-CM | POA: Diagnosis not present

## 2016-02-25 DIAGNOSIS — Z1389 Encounter for screening for other disorder: Secondary | ICD-10-CM | POA: Diagnosis not present

## 2016-02-25 DIAGNOSIS — I519 Heart disease, unspecified: Secondary | ICD-10-CM | POA: Diagnosis not present

## 2016-02-25 NOTE — Telephone Encounter (Signed)
He is requesting the results for the pt. ONO not the download from his CPAP machine, was able to speak with Iona Beard from [place] and he stated he was able to find the result and was going to fax it over today, will check the fax later on today to make sure I have received it

## 2016-02-25 NOTE — Telephone Encounter (Signed)
AD  I have the fax, it is in your look at, the results from his ONO

## 2016-02-27 ENCOUNTER — Ambulatory Visit: Payer: MEDICARE | Admitting: Cardiovascular Disease

## 2016-03-01 ENCOUNTER — Telehealth: Payer: Self-pay | Admitting: Pulmonary Disease

## 2016-03-01 DIAGNOSIS — J439 Emphysema, unspecified: Secondary | ICD-10-CM

## 2016-03-01 NOTE — Telephone Encounter (Signed)
Spoke with wife pt. Was asleep she will have him call us once he wakes up

## 2016-03-01 NOTE — Telephone Encounter (Signed)
    Jasmine :  1. Pls call pt and tell him the ONO on CPAP and RA was OK.  Based on that ONO, we does not need O2 with cpap.  We can d/c O2 if it will be OK with him.   2.  DL for dec 2017 was better. pls tell him.  I think he is having mask issues.  DL showed AHI10, autocpap 5-15 cm water, 90%.  Can we pls get a DL for Feb?   Thanks.   Monica Becton, MD 03/01/2016, 11:14 AM [facility] Pager [phone] After 3 pm or if no answer, call [phone]

## 2016-03-01 NOTE — Telephone Encounter (Signed)
Randy Shirl Harris, MD       Jasmine :  1. Pls call pt and tell him the ONO on CPAP and RA was OK.  Based on that ONO, we does not need O2 with cpap.  We can d/c O2 if it will be OK with him.   2.  DL for dec 2017 was better. pls tell him.  I think he is having mask issues.  DL showed AHI10, autocpap 5-15 cm water, 90%.  Can we pls get a DL for Feb?   Thanks.   Monica Becton, MD 03/01/2016, 11:14 AM [facility] Pager [phone] After 3 pm or if no answer, call [phone]       lmtcb x1 for pt.

## 2016-03-02 DIAGNOSIS — H524 Presbyopia: Secondary | ICD-10-CM | POA: Diagnosis not present

## 2016-03-02 DIAGNOSIS — H40053 Ocular hypertension, bilateral: Secondary | ICD-10-CM | POA: Diagnosis not present

## 2016-03-02 DIAGNOSIS — H2513 Age-related nuclear cataract, bilateral: Secondary | ICD-10-CM | POA: Diagnosis not present

## 2016-03-02 NOTE — Telephone Encounter (Signed)
Another nurse spoke with pt. About results. Please see other phone note. Nothing further is needed

## 2016-03-02 NOTE — Telephone Encounter (Signed)
Pt aware of below message and voiced his understanding.  Pt agreed to d/c O2. Order has been placed to [facility] to d/c night time oxygen. [place] [place] wants a DL on pt in feb, will you make a reminder. Nothing further needed.

## 2016-03-15 ENCOUNTER — Encounter: Payer: Self-pay | Admitting: Pulmonary Disease

## 2016-03-16 DIAGNOSIS — N3941 Urge incontinence: Secondary | ICD-10-CM | POA: Diagnosis not present

## 2016-03-16 DIAGNOSIS — R350 Frequency of micturition: Secondary | ICD-10-CM | POA: Diagnosis not present

## 2016-03-17 DIAGNOSIS — C44712 Basal cell carcinoma of skin of right lower limb, including hip: Secondary | ICD-10-CM | POA: Diagnosis not present

## 2016-04-01 DIAGNOSIS — H2511 Age-related nuclear cataract, right eye: Secondary | ICD-10-CM | POA: Diagnosis not present

## 2016-04-01 DIAGNOSIS — H25811 Combined forms of age-related cataract, right eye: Secondary | ICD-10-CM | POA: Diagnosis not present

## 2016-04-06 DIAGNOSIS — R350 Frequency of micturition: Secondary | ICD-10-CM | POA: Diagnosis not present

## 2016-04-14 ENCOUNTER — Encounter: Payer: Self-pay | Admitting: Pulmonary Disease

## 2016-04-21 ENCOUNTER — Telehealth: Payer: Self-pay | Admitting: Pulmonary Disease

## 2016-04-21 DIAGNOSIS — G4733 Obstructive sleep apnea (adult) (pediatric): Secondary | ICD-10-CM

## 2016-04-21 NOTE — Telephone Encounter (Signed)
    DL for the last month :  97%, AHI 12.4.  With some leak issues.   Jasmine :  1. Can you ask pt if he continues to have leak around his mask?  I think he does.  If so, can we schedule him for a mask fitting session with Lynnae Sandhoff?  He has nasal pillows and I am not sure if FFM will be better.   2. Can we get a DL for April then ?  Thanks!  J. Shirl Harris, MD 04/21/2016, 9:08 AM [facility] Pager [phone] After 3 pm or if no answer, call [phone]

## 2016-04-22 DIAGNOSIS — H25812 Combined forms of age-related cataract, left eye: Secondary | ICD-10-CM | POA: Diagnosis not present

## 2016-04-22 DIAGNOSIS — H2512 Age-related nuclear cataract, left eye: Secondary | ICD-10-CM | POA: Diagnosis not present

## 2016-04-22 NOTE — Telephone Encounter (Signed)
lmomtcb x1 

## 2016-04-23 DIAGNOSIS — M170 Bilateral primary osteoarthritis of knee: Secondary | ICD-10-CM | POA: Diagnosis not present

## 2016-04-23 DIAGNOSIS — M1712 Unilateral primary osteoarthritis, left knee: Secondary | ICD-10-CM | POA: Diagnosis not present

## 2016-04-23 DIAGNOSIS — M1711 Unilateral primary osteoarthritis, right knee: Secondary | ICD-10-CM | POA: Diagnosis not present

## 2016-04-26 NOTE — Telephone Encounter (Signed)
Spoke with pt's wife, [name]. She is aware of results. Order has been placed for mask fitting. Nothing further was needed.

## 2016-04-27 DIAGNOSIS — N3941 Urge incontinence: Secondary | ICD-10-CM | POA: Diagnosis not present

## 2016-04-27 DIAGNOSIS — R350 Frequency of micturition: Secondary | ICD-10-CM | POA: Diagnosis not present

## 2016-05-04 ENCOUNTER — Ambulatory Visit (HOSPITAL_BASED_OUTPATIENT_CLINIC_OR_DEPARTMENT_OTHER): Payer: MEDICARE | Attending: Pulmonary Disease | Admitting: Radiology

## 2016-05-04 DIAGNOSIS — G4733 Obstructive sleep apnea (adult) (pediatric): Secondary | ICD-10-CM

## 2016-05-05 DIAGNOSIS — E559 Vitamin D deficiency, unspecified: Secondary | ICD-10-CM | POA: Diagnosis not present

## 2016-05-05 DIAGNOSIS — E038 Other specified hypothyroidism: Secondary | ICD-10-CM | POA: Diagnosis not present

## 2016-05-05 DIAGNOSIS — E7800 Pure hypercholesterolemia, unspecified: Secondary | ICD-10-CM | POA: Diagnosis not present

## 2016-05-05 DIAGNOSIS — Z125 Encounter for screening for malignant neoplasm of prostate: Secondary | ICD-10-CM | POA: Diagnosis not present

## 2016-05-05 DIAGNOSIS — I10 Essential (primary) hypertension: Secondary | ICD-10-CM | POA: Diagnosis not present

## 2016-05-23 ENCOUNTER — Encounter: Payer: Self-pay | Admitting: Pulmonary Disease

## 2016-05-24 ENCOUNTER — Ambulatory Visit (INDEPENDENT_AMBULATORY_CARE_PROVIDER_SITE_OTHER): Payer: MEDICARE | Admitting: Pulmonary Disease

## 2016-05-24 ENCOUNTER — Encounter: Payer: Self-pay | Admitting: Pulmonary Disease

## 2016-05-24 DIAGNOSIS — G4733 Obstructive sleep apnea (adult) (pediatric): Secondary | ICD-10-CM

## 2016-05-24 DIAGNOSIS — G2581 Restless legs syndrome: Secondary | ICD-10-CM | POA: Diagnosis not present

## 2016-05-24 DIAGNOSIS — R058 Other specified cough: Secondary | ICD-10-CM

## 2016-05-24 DIAGNOSIS — R05 Cough: Secondary | ICD-10-CM

## 2016-05-24 DIAGNOSIS — J439 Emphysema, unspecified: Secondary | ICD-10-CM

## 2016-05-24 NOTE — Progress Notes (Signed)
Subjective:    Patient ID: Randy Sullivan, male    DOB: 1928-06-06, 83 y.o.   MRN: [id number]  HPI   This is the case of Randy Sullivan, 83 y.o. Male, who was referred by Dr. Prince Solian  in consultation regarding OSA.   As you very well know, patient smoked cigarettes roughly x 20 yrs, on and off, maybe 1 PPD. He has not been dxed with asthma or COPD.   Patient complains of hypersomnia, snoring, witnessed apneas, occasional gasping or choking. He had an ONO 2-3 months ago which showed significant o2 desatn. He was started on o2 2 L at HS c/o [facility].   Pt had a sleep study on July 31, 2015. AHI was 7.5. CPAP was held off. PLMSI was elevated at 115. His legs dont bother him much.   Recent SOB with ADLs x 2 mos. (-) cough, cp.   Has not been admitted nor has been on abx recently.   ROV  01/21/16 Pt is here as follow up on his OSA and SOB. He has received his CPAP and feels better using it. More energy. Less sleepiness. Download the last month: 90%, AHI 12. Has leak  Issues. Breathing is better with Breo. Sinus congestion also better.  He is not using O2 with cpap > he randomly checks his O2 and it is usually 90%. Has not been admitted nor has been on abx since last seen.   ROV 05/24/2016 Patient returns to the office as follow-up on his sleep apnea and COPD. Since last seen, CPAP download showed elevated AHI related to leak issues. Download done in February showed AHI of 12. He had a mask fitting session with Lynnae Sandhoff. Mask is better. Less leak.  Download the last month: 90%: AHI down to 6.7 from 12 in February.Over all feels benefit of cpap. Copd and cough are better and stable.    Review of Systems  Constitutional: Negative.  Negative for fever and unexpected weight change.  HENT: Negative for congestion, dental problem, ear pain, nosebleeds, postnasal drip, rhinorrhea, sinus pressure, sneezing, sore throat and trouble swallowing.   Eyes: Negative.  Negative for redness and itching.    Respiratory: Positive for shortness of breath. Negative for cough, chest tightness and wheezing.   Cardiovascular: Negative.  Negative for palpitations and leg swelling.  Gastrointestinal: Negative.  Negative for nausea and vomiting.  Endocrine: Negative.   Genitourinary: Negative.  Negative for dysuria.  Musculoskeletal: Negative for arthralgias and joint swelling.  Skin: Negative.  Negative for rash.  Allergic/Immunologic: Negative.   Neurological: Negative.  Negative for headaches.  Hematological: Negative.  Does not bruise/bleed easily.  Psychiatric/Behavioral: Negative.  The patient is not nervous/anxious.        Objective:   Physical Exam  Vitals:  Vitals:   05/24/16 1039  BP: (!) 142/82  Pulse: 66  SpO2: 98%  Weight: 190 lb 6.4 oz (86.4 kg)  Height: 6' (1.829 m)    Constitutional/General:  Pleasant, well-nourished, well-developed, not in any distress,  Comfortably seating.  Well kempt  Body mass index is 25.82 kg/m. Wt Readings from Last 3 Encounters:  05/24/16 190 lb 6.4 oz (86.4 kg)  02/05/16 185 lb (83.9 kg)  01/21/16 187 lb (84.8 kg)       HEENT: Pupils equal and reactive to light and accommodation. Anicteric sclerae. Normal nasal mucosa.   No oral  lesions,  mouth clear,  oropharynx clear, no postnasal drip. (-) Oral thrush. No dental caries.  Airway -  Mallampati class III  Neck: No masses. Midline trachea. No JVD, (-) LAD. (-) bruits appreciated.  Respiratory/Chest: Grossly normal chest. (-) deformity. (-) Accessory muscle use.  Symmetric expansion. (-) Tenderness on palpation.  Resonant on percussion.  Diminished BS on both lower lung zones. (-) wheezing, crackles, rhonchi (-) egophony  Cardiovascular: Regular rate and  rhythm, heart sounds normal, no murmur or gallops, no peripheral edema  Gastrointestinal:  Normal bowel sounds. Soft, non-tender. No hepatosplenomegaly.  (-) masses.   Musculoskeletal:  Normal muscle tone. Normal gait.    Extremities: Grossly normal. (-) clubbing, cyanosis.  (-) edema  Skin: (-) rash,lesions seen.   Neurological/Psychiatric : alert, oriented to time, place, person. Normal mood and affect          Assessment & Plan:  COPD (chronic obstructive pulmonary disease) (HCC) Mild COPD.  PFT  2.63  90%, DLCO 58% Feels better with Breo. Cont Breo for now. Alb prn. Vaccines UTD. Discussed re: action plan with AECOPD.    OSA (obstructive sleep apnea) Patient complains of hypersomnia, snoring, witnessed apneas, occasional gasping or choking. He had an ONO 2-3 months ago which showed significant o2 desatn. He was started on o2 2 L at HS c/o [facility].   Pt had a sleep study on July 31, 2015. AHI was 7.5. CPAP was held off. PLMSI was elevated at 115. His legs dont bother him much.   On autocpap 5-15 cm water.  Over all, feels better with cpap per wife.  Less sleepiness, snoring hypersomnia.  Feels benefit of cpap.  DL the last month : 90%, AHI 6.7 from 12 back in 03/2016.    Plan :  We extensively discussed the importance of treating OSA and the need to use PAP therapy.   Continue with autocpap 5-15 cm water. Pt has nasal pillows and tolerating well.  Had leak issues but better now.  DL with better AHI at 6.7 from 12.  Previous DL with elevated AHI likely related to mask leak issues.   ONO on cpap and RA with no significant desaturation.     Patient was instructed to have mask, tubings, filter, reservoir cleaned at least once a week with soapy water.  Patient was instructed to call the office if he/she is having issues with the PAP device.    I advised patient to obtain sufficient amount of sleep --  7 to 8 hours at least in a 24 hr period.  Patient was advised to follow good sleep hygiene.  Patient was advised NOT to engage in activities requiring concentration and/or vigilance if he/she is and  sleepy.  Patient is NOT to drive if he/she is sleepy.            Upper airway  cough syndrome Pt with recent sinus congestion but better now.  Try to wean off singulair and flonase if possible.  Likely will need to be on flonase though.  He uses nasal mask.   RLS (restless legs syndrome) Pt with RLS. PSG with eleveated PLMSI at 115. On Gabapentin at HS. On cpap.  RLS is better. Gabapentin also helps with ET.      Patient will follow up in 6 months    J. Shirl Harris, MD 05/24/2016   8:18 PM [facility] [place] Pager: [phone] Office: [phone], Fax: [phone]

## 2016-05-24 NOTE — Assessment & Plan Note (Addendum)
Pt with recent sinus congestion but better now.  Try to wean off singulair and flonase if possible.  Likely will need to be on flonase though.  He uses nasal mask.

## 2016-05-24 NOTE — Assessment & Plan Note (Addendum)
Patient complains of hypersomnia, snoring, witnessed apneas, occasional gasping or choking. He had an ONO 2-3 months ago which showed significant o2 desatn. He was started on o2 2 L at HS c/o [facility].   Pt had a sleep study on July 31, 2015. AHI was 7.5. CPAP was held off. PLMSI was elevated at 115. His legs dont bother him much.   On autocpap 5-15 cm water.  Over all, feels better with cpap per wife.  Less sleepiness, snoring hypersomnia.  Feels benefit of cpap.  DL the last month : 90%, AHI 6.7 from 12 back in 03/2016.    Plan :  We extensively discussed the importance of treating OSA and the need to use PAP therapy.   Continue with autocpap 5-15 cm water. Pt has nasal pillows and tolerating well.  Had leak issues but better now.  DL with better AHI at 6.7 from 12.  Previous DL with elevated AHI likely related to mask leak issues.   ONO on cpap and RA with no significant desaturation.     Patient was instructed to have mask, tubings, filter, reservoir cleaned at least once a week with soapy water.  Patient was instructed to call the office if he/she is having issues with the PAP device.    I advised patient to obtain sufficient amount of sleep --  7 to 8 hours at least in a 24 hr period.  Patient was advised to follow good sleep hygiene.  Patient was advised NOT to engage in activities requiring concentration and/or vigilance if he/she is and  sleepy.  Patient is NOT to drive if he/she is sleepy.

## 2016-05-24 NOTE — Patient Instructions (Signed)
  It was a pleasure taking care of you today!  Continue using your CPAP machine.    Please make sure you use your CPAP device everytime you sleep.  We will monitor the usage of your machine per your insurance requirement.  Your insurance company may take the machine from you if you are not using it regularly.   Please clean the mask, tubings, filter, water reservoir with soapy water every week.  Please use distilled water for the water reservoir.   Please call the office or your machine provider (DME company) if you are having issues with the device.   Continue using Breo 1 puff daily.  Try to wean off singulair and flonase.   Return to clinic in 6 months    with  NP/APP

## 2016-05-24 NOTE — Assessment & Plan Note (Addendum)
Mild COPD.  PFT  2.63  90%, DLCO 58% Feels better with Breo. Cont Breo for now. Alb prn. Vaccines UTD. Discussed re: action plan with AECOPD.

## 2016-05-24 NOTE — Assessment & Plan Note (Addendum)
Pt with RLS. PSG with eleveated PLMSI at 115. On Gabapentin at HS. On cpap.  RLS is better. Gabapentin also helps with ET.

## 2016-05-25 DIAGNOSIS — Z0000 Encounter for general adult medical examination without abnormal findings: Secondary | ICD-10-CM | POA: Diagnosis not present

## 2016-05-25 DIAGNOSIS — I700 Atherosclerosis of aorta: Secondary | ICD-10-CM | POA: Diagnosis not present

## 2016-05-25 DIAGNOSIS — I10 Essential (primary) hypertension: Secondary | ICD-10-CM | POA: Diagnosis not present

## 2016-05-25 DIAGNOSIS — R350 Frequency of micturition: Secondary | ICD-10-CM | POA: Diagnosis not present

## 2016-05-25 DIAGNOSIS — Z6826 Body mass index (BMI) 26.0-26.9, adult: Secondary | ICD-10-CM | POA: Diagnosis not present

## 2016-05-25 DIAGNOSIS — E038 Other specified hypothyroidism: Secondary | ICD-10-CM | POA: Diagnosis not present

## 2016-05-25 DIAGNOSIS — Z1389 Encounter for screening for other disorder: Secondary | ICD-10-CM | POA: Diagnosis not present

## 2016-05-25 DIAGNOSIS — I519 Heart disease, unspecified: Secondary | ICD-10-CM | POA: Diagnosis not present

## 2016-05-25 DIAGNOSIS — G4733 Obstructive sleep apnea (adult) (pediatric): Secondary | ICD-10-CM | POA: Diagnosis not present

## 2016-05-25 DIAGNOSIS — N3941 Urge incontinence: Secondary | ICD-10-CM | POA: Diagnosis not present

## 2016-05-25 DIAGNOSIS — R413 Other amnesia: Secondary | ICD-10-CM | POA: Diagnosis not present

## 2016-05-25 DIAGNOSIS — E559 Vitamin D deficiency, unspecified: Secondary | ICD-10-CM | POA: Diagnosis not present

## 2016-05-25 DIAGNOSIS — F39 Unspecified mood [affective] disorder: Secondary | ICD-10-CM | POA: Diagnosis not present

## 2016-05-25 DIAGNOSIS — G2581 Restless legs syndrome: Secondary | ICD-10-CM | POA: Diagnosis not present

## 2016-05-27 DIAGNOSIS — M170 Bilateral primary osteoarthritis of knee: Secondary | ICD-10-CM | POA: Diagnosis not present

## 2016-06-03 DIAGNOSIS — M170 Bilateral primary osteoarthritis of knee: Secondary | ICD-10-CM | POA: Diagnosis not present

## 2016-06-08 DIAGNOSIS — R251 Tremor, unspecified: Secondary | ICD-10-CM | POA: Diagnosis not present

## 2016-06-08 DIAGNOSIS — Z6826 Body mass index (BMI) 26.0-26.9, adult: Secondary | ICD-10-CM | POA: Diagnosis not present

## 2016-06-08 DIAGNOSIS — I10 Essential (primary) hypertension: Secondary | ICD-10-CM | POA: Diagnosis not present

## 2016-06-11 DIAGNOSIS — M170 Bilateral primary osteoarthritis of knee: Secondary | ICD-10-CM | POA: Diagnosis not present

## 2016-06-15 DIAGNOSIS — N3941 Urge incontinence: Secondary | ICD-10-CM | POA: Diagnosis not present

## 2016-06-17 ENCOUNTER — Telehealth: Payer: Self-pay

## 2016-06-17 NOTE — Telephone Encounter (Signed)
I called pt, spoke pt's wife,[name], per DPR, and reminded her to bring pt's cpap to the appt on Monday with Dr. Rexene Alberts. [name] verbalized understanding.

## 2016-06-21 ENCOUNTER — Telehealth: Payer: Self-pay | Admitting: Pulmonary Disease

## 2016-06-21 ENCOUNTER — Encounter: Payer: Self-pay | Admitting: Neurology

## 2016-06-21 ENCOUNTER — Ambulatory Visit (INDEPENDENT_AMBULATORY_CARE_PROVIDER_SITE_OTHER): Payer: MEDICARE | Admitting: Neurology

## 2016-06-21 VITALS — BP 137/75 | HR 60 | Resp 18 | Ht 72.0 in | Wt 191.0 lb

## 2016-06-21 DIAGNOSIS — G4761 Periodic limb movement disorder: Secondary | ICD-10-CM | POA: Diagnosis not present

## 2016-06-21 DIAGNOSIS — G4733 Obstructive sleep apnea (adult) (pediatric): Secondary | ICD-10-CM

## 2016-06-21 DIAGNOSIS — G479 Sleep disorder, unspecified: Secondary | ICD-10-CM | POA: Diagnosis not present

## 2016-06-21 NOTE — Progress Notes (Signed)
Subjective:    Patient ID: Randy Sullivan is a 83 y.o. male.  HPI     Interim history:   Randy Sullivan is a 83 year old right-handed gentleman with an underlying medical history of hypothyroidism, hypertension, reflux disease, BPH, actinic keratosis, degenerative joint disease, particularly of the knees, tremor, seasonal allergies, left ankle fracture, who presents for follow-up consultation of his sleep disorder, in particular PLMD. The patient is accompanied by his wife today. I last saw him on 12/18/2015, at which time he reported doing better. He was less restless, was taking gabapentin 100 mg at night. He had some knee pain bilaterally and ankle pain. He was complaining of nocturia, he was followed by urology and also had treatment for his bladder hyperactivity. I suggested he try to increase his gabapentin to 200 mg each night. I encouraged him to start using a cane.   Today, 06/21/2016 (all dictated new, as well as above notes, some dictation done in note pad or Word, outside of chart, may appear as copied):  He reports still having b/l knee pain and ankle pain. He had interim sleep study testing under pulmonology in March 2018 and is followed by Dr. Corrie Dandy for OSA. He has been on AutoPAP. On gabapentin, which is currently at 300 mg qHS, was increased by PCP in Jan 2018. Mouth is dry at night, mask is leaking. Per autoPAP compliance, leak is indeed high, patient is compliance is actually very good at 87%, but residual AHI higher at nearly 20/hour. He had a recent appointment with Dr. Corrie Dandy earlier this month. It sounds like he also met with a respiratory therapist at: Sleep lab to talk about his mask issues, his DME company, [employer] is also aware of his mask issues but wife indicated that they did not help very much. His propranolol was recently increased to 10 mg 3 times a day, for his tremor. He does not use supplemental oxygen.  The patient's allergies, current medications, family history,  past medical history, past social history, past surgical history and problem list were reviewed and updated as appropriate.   Previously (copied from previous notes for reference):   I saw him on 08/11/2015, at which time we talked about his sleep study results from 07/30/2015, indicating mild obstructive sleep apnea and severe PLMS. I suggested symptomatic treatment for his PLMD with gabapentin low-dose. We also talked about potentially trying CPAP therapy for his overall mild sleep apnea with an AHI of 7.5 per hour, worse in supine sleep. His wife had noted leg twitching in the past 2 or 3 months. His nocturia was about the same. He was on home oxygen. He denies restless leg symptoms but did report discomfort in his left knee and left ankle.    I first met him on 07/09/2015 at the request of his primary care physician, at which time the patient reported snoring, nonrestorative sleep, significant nocturia and daytime tiredness. He had recently undergone an overnight pulse oximetry test of which the results were pending at the time. In the interim, I reviewed his test results on 07/29/2015: Pulse oximetry overnight was done on 07/04/2015. Average oxygen saturation was 94.7%, total test time 8 hours and 9 minutes. Lowest oxygen saturation 85%, time below 88% saturation of 4.6 minutes. He was placed on oxygen at night by his primary care physician. He had a baseline sleep study on 07/30/2015: Sleep efficiency was 47.4%, latency to sleep 114.5 minutes, wake after sleep onset was 141.5 minutes with several longer periods  of wakefulness and 6 restaurant breaks. The patient wanted to contact the sleep study with his home oxygen level of 2 L/m and therefore the study was done with his supplemental oxygen. He was noted to have severe PLMS. Index was 114.5 per hour, resulting in only 2.3 arousals per hour. He had rare PVCs on EKG. He had an increased percentage of stage II sleep, slow-wave sleep was 10.2%, REM sleep  was 14.3%, REM latency was prolonged at 138 minutes. He had minimal snoring. Total AHI was 7.5 per hour. Average oxygen saturation was 97%, nadir was 94%, again with supplemental oxygen at 2 L/m.   07/09/2015: He reports snoring, daytime tiredness, nonrestorative sleep nocturia (3-4 x/night). I reviewed your office note from 06/25/2015. He quit smoking many years ago, over 30 years ago. He drinks alcohol occasionally, he does not use illicit drugs, he drinks non-caffeine coffee. Per wife, he is sleepy during the day. His Epworth sleepiness score is 12 out of 24 today, his fatigue score is 39/63. He is a retired Engineer, structural. His first wife passed away in May 05, 2004. He remarried in 06-May-2007.   He is no longer on Proscar or Flomax, he had side effects from them, including feeling off balance, he is currently on Myrbetriq.   He takes low-dose propanolol, 10 mg twice daily for his tremors which has been helpful. He has had some weight loss and loss of appetite in the past weeks to months. He had recent blood work in your office, CMP and CBC. You also ordered a CT head without contrast. He was complaining of memory loss. He had a head CT without contrast on 07/02/2015: IMPRESSION: No acute abnormality noted.   He also had a recent nocturnal pulse oximetry test through your office, results are pending, this test was done just 2 nights ago. We will request test results from your office.   In addition, I personally reviewed the images through the PACS system.   He complains of lack of energy and the need to lay down to rest and he may doze off.   He has aching in the L knee and he twitches his feet in sleep, per wife. He sees Dr. Maureen Ralphs for this has had injections to the L knee, including Synvisc.  Sometimes he has has bifrontal head pressure in the mornings.   He does not have a sleep apnea family history. He has an older sister, she is not very well, has not been diagnosed with sleep apnea. He has no  children.  His Past Medical History Is Significant For: Past Medical History:  Diagnosis Date  . BPH (benign prostatic hyperplasia)   . DJD (degenerative joint disease)   . Familial tremor   . GERD (gastroesophageal reflux disease)   . Hyperlipidemia   . Hypertension   . Mood disorder ([place])   . Osteoarthritis   . Prostatism   . Shortness of breath   . Thyroid disease     His Past Surgical History Is Significant For: Past Surgical History:  Procedure Laterality Date  . APPENDECTOMY    . HEMORROIDECTOMY      His Family History Is Significant For: Family History  Problem Relation Age of Onset  . Heart attack Father   . Colon cancer Mother   . Colon cancer Sister     His Social History Is Significant For: Social History   Social History  . Marital status: Married    Spouse name: N/A  . Number of children: N/A  .  Years of education: N/A   Occupational History  . retired    Social History Main Topics  . Smoking status: Former Smoker    Packs/day: 2.00    Years: 18.00    Types: Cigarettes    Quit date: 06/04/1970  . Smokeless tobacco: Never Used  . Alcohol use 0.0 oz/week  . Drug use: No  . Sexual activity: Not Asked   Other Topics Concern  . None   Social History Narrative   Rare caffeine use     His Allergies Are:  No Known Allergies:   His Current Medications Are:  Outpatient Encounter Prescriptions as of 06/21/2016  Medication Sig  . albuterol (PROVENTIL HFA;VENTOLIN HFA) 108 (90 Base) MCG/ACT inhaler Inhale 2 puffs into the lungs every 4 (four) hours as needed for wheezing or shortness of breath.  Marland Kitchen aspirin 81 MG tablet Take 81 mg by mouth daily.    . Cholecalciferol (VITAMIN D) 1000 UNITS capsule Take 2,000 Units by mouth daily.    . fluticasone (FLONASE) 50 MCG/ACT nasal spray Place 1 spray into both nostrils daily.  . fluticasone furoate-vilanterol (BREO ELLIPTA) 100-25 MCG/INH AEPB Inhale 1 puff into the lungs daily.  Marland Kitchen gabapentin (NEURONTIN)  300 MG capsule Take 300 mg by mouth at bedtime.  Marland Kitchen levothyroxine (SYNTHROID, LEVOTHROID) 112 MCG tablet Take 112 mcg by mouth daily before breakfast.   . montelukast (SINGULAIR) 10 MG tablet Take 1 tablet (10 mg total) by mouth at bedtime.  Marland Kitchen MYRBETRIQ 50 MG TB24 tablet   . propranolol (INDERAL) 10 MG tablet Take 10 mg by mouth 2 (two) times daily.   . vitamin B-12 (CYANOCOBALAMIN) 1000 MCG tablet Take 1,200 mcg by mouth daily.  . [DISCONTINUED] gabapentin (NEURONTIN) 100 MG capsule Take 2 capsules (200 mg total) by mouth at bedtime.   No facility-administered encounter medications on file as of 06/21/2016.   :  Review of Systems:  Out of a complete 14 point review of systems, all are reviewed and negative with the exception of these symptoms as listed below:  Review of Systems  Neurological:       Wife states that the patient's CPAP is leaking.     Objective:  Neurologic Exam  Physical Exam Physical Examination:   Vitals:   06/21/16 0941  BP: 137/75  Pulse: 60  Resp: 18    General Examination: The patient is a very pleasant 83 y.o. male in no acute distress. He appears well-developed and well-nourished and well groomed.   HEENT: Normocephalic, atraumatic, pupils are equal, round and reactive to light and accommodation. Extraocular tracking is good without limitation to gaze excursion or nystagmus noted. Normal smooth pursuit is noted. Hearing is grossly intact. Face is symmetric with normal facial animation and normal facial sensation. Speech is clear with no dysarthria noted. There is no hypophonia. There is no lip, neck/head, jaw tremor, slight voice tremor. Oropharynx exam reveals: mild mouth dryness, adequate dental hygiene and mild airway crowding. Mallampati is class II. Tongue protrudes centrally and palate elevates symmetrically.   Chest: Clear to auscultation without wheezing, rhonchi or crackles noted.  Heart: S1+S2+0, regular and normal without murmurs, rubs or  gallops noted.   Abdomen: Soft, non-tender and non-distended with normal bowel sounds appreciated on auscultation.  Extremities: There is no pitting edema in the distal lower extremities bilaterally. Pedal pulses are intact.  Skin: Warm and dry without trophic changes noted.  Musculoskeletal: exam reveals bilateral knee swelling, bilateral ankle discomfort is reported.    Neurologically:  Mental status: The patient is awake, alert and oriented in all 4 spheres. His immediate and remote memory, attention, language skills and fund of knowledge are appropriate. There is no evidence of aphasia, agnosia, apraxia or anomia. Speech is clear with normal prosody and enunciation. Thought process is linear. Mood is normal and affect is normal.  Cranial nerves II - XII are as described above under HEENT exam. In addition: shoulder shrug is normal with equal shoulder height noted. Motor exam: Normal bulk, strength and tone is noted. There is no drift, resting tremor. Romberg is not tested for safety. He has a mild bilateral upper extremity postural and action tremor, right more than left. Fine motor skills are fairly preserved bilaterally. He stands with difficulty and walks slowly and cautiously, no walking aid. Tandem walk is not possible. Sensory exam is intact to light touch. Reflexes are 1+ in the upper extremities, difficult to elicit in the lower extremities, secondary to discomfort in both knees and both ankles.  Assessment and Plan:    In summary, Randy Sullivan is a very pleasant 83 y.o.-year old male with an underlying medical history of hypothyroidism, hypertension, reflux disease, BPH, actinic keratosis, degenerative joint disease, particularly of the knees, tremor, seasonal allergies, left ankle fracture, who presents for follow-up consultation of his sleep disturbance, including PLMs and history of mild OSA. He had a sleep study on 07/30/2015 which showed an overall AHI of 7.5 per hour, O2 nadir  reassuringly at 94% only. He had severe PLMS with an index of 114.5 per hour, with minimal arousals. Nevertheless, I suggested we try him on low-dose gabapentin.  he was on 100 mg and gradually increase this to 300 mg, most recently by his PCP around January 2018. He felt that gabapentin was helpful. He saw in the interim pulmonology and was started on AutoPap therapy. He has issues more recently with mask leakage and mouth dryness. He is encouraged to talk to the respiratory therapist again. He can also talk to his DME company again about these issues. From my end of things I suggested he continue with gabapentin 300 mg at night but I would recommend not increasing this any further for fear of side effects including daytime somnolence and balance problems. He is encouraged to use a cane for gait safety. He also suffers from bilateral knee and ankle pain and may be at risk for falls considering all these issues. I suggested as needed follow-up from my end of things. I answered all their questions today and the patient and his wife were in agreement.  I spent 20 minutes in total face-to-face time with the patient, more than 50% of which was spent in counseling and coordination of care, reviewing test results, reviewing medication and discussing or reviewing the diagnosis of PLMs, OSA, the its prognosis and treatment options. Pertinent laboratory and imaging test results that were available during this visit with the patient were reviewed by me and considered in my medical decision making (see chart for details).

## 2016-06-21 NOTE — Telephone Encounter (Signed)
AD please advise if ok to place order for cpap mask fit session with Lynnae Sandhoff.  Thanks!

## 2016-06-21 NOTE — Patient Instructions (Addendum)
I suggest you continue with gabapentin 300 mg each night for your leg movements in your sleep. I would not recommend increasing this further because of side effects including daytime sleepiness and balance problems. Please use your cane for gait safety.  For your CPAP mask issues and mouth dryness, I would recommend you call [facility] and also Dr. Murlean Iba' office for further advice.  From my end of things, I can see you back as needed.

## 2016-06-21 NOTE — Telephone Encounter (Signed)
Most definitely.  Pls try to expedite the process with Lynnae Sandhoff.  Thanks.   Monica Becton, MD 06/21/2016, 9:56 PM [facility] Pager [phone] After 3 pm or if no answer, call [phone]

## 2016-06-22 NOTE — Telephone Encounter (Signed)
Called and spoke with pts wife and she is aware of appt placed and that someone will be calling to set this up.

## 2016-06-29 ENCOUNTER — Ambulatory Visit (HOSPITAL_BASED_OUTPATIENT_CLINIC_OR_DEPARTMENT_OTHER): Payer: MEDICARE | Attending: Pulmonary Disease | Admitting: Radiology

## 2016-07-06 ENCOUNTER — Encounter: Payer: Self-pay | Admitting: Pulmonary Disease

## 2016-07-06 DIAGNOSIS — R350 Frequency of micturition: Secondary | ICD-10-CM | POA: Diagnosis not present

## 2016-07-06 DIAGNOSIS — N3941 Urge incontinence: Secondary | ICD-10-CM | POA: Diagnosis not present

## 2016-07-09 ENCOUNTER — Telehealth: Payer: Self-pay | Admitting: Pulmonary Disease

## 2016-07-09 NOTE — Telephone Encounter (Signed)
Noted  

## 2016-07-09 NOTE — Telephone Encounter (Signed)
   Patient on CPAP with a lot of leak issues. He ended up seeing Lynnae Sandhoff on May 10 and was prescribed a new mask.  Jasmine: Can we get a download for June 2018?    DL the last month (prior to mask change) : 90%, AHI 19.    [place], MD 07/09/2016, 2:06 PM [facility] Pager [phone] After 3 pm or if no answer, call [phone]

## 2016-07-12 DIAGNOSIS — R6889 Other general symptoms and signs: Secondary | ICD-10-CM | POA: Diagnosis not present

## 2016-07-12 DIAGNOSIS — R1310 Dysphagia, unspecified: Secondary | ICD-10-CM | POA: Diagnosis not present

## 2016-07-12 DIAGNOSIS — Z6826 Body mass index (BMI) 26.0-26.9, adult: Secondary | ICD-10-CM | POA: Diagnosis not present

## 2016-07-12 DIAGNOSIS — R2681 Unsteadiness on feet: Secondary | ICD-10-CM | POA: Diagnosis not present

## 2016-07-12 DIAGNOSIS — Z8719 Personal history of other diseases of the digestive system: Secondary | ICD-10-CM | POA: Diagnosis not present

## 2016-07-12 DIAGNOSIS — G4733 Obstructive sleep apnea (adult) (pediatric): Secondary | ICD-10-CM | POA: Diagnosis not present

## 2016-07-13 ENCOUNTER — Other Ambulatory Visit (HOSPITAL_COMMUNITY): Payer: Self-pay | Admitting: Internal Medicine

## 2016-07-13 DIAGNOSIS — R1310 Dysphagia, unspecified: Secondary | ICD-10-CM

## 2016-07-16 DIAGNOSIS — M25572 Pain in left ankle and joints of left foot: Secondary | ICD-10-CM | POA: Diagnosis not present

## 2016-07-16 DIAGNOSIS — M25571 Pain in right ankle and joints of right foot: Secondary | ICD-10-CM | POA: Diagnosis not present

## 2016-07-16 DIAGNOSIS — G8929 Other chronic pain: Secondary | ICD-10-CM | POA: Diagnosis not present

## 2016-07-20 ENCOUNTER — Ambulatory Visit (HOSPITAL_COMMUNITY)
Admission: RE | Admit: 2016-07-20 | Discharge: 2016-07-20 | Disposition: A | Discharge status: Home / Self Care | Payer: MEDICARE | Source: Ambulatory Visit | Attending: Internal Medicine | Admitting: Internal Medicine

## 2016-07-20 DIAGNOSIS — I10 Essential (primary) hypertension: Secondary | ICD-10-CM | POA: Insufficient documentation

## 2016-07-20 DIAGNOSIS — E785 Hyperlipidemia, unspecified: Secondary | ICD-10-CM | POA: Insufficient documentation

## 2016-07-20 DIAGNOSIS — M1990 Unspecified osteoarthritis, unspecified site: Secondary | ICD-10-CM | POA: Diagnosis not present

## 2016-07-20 DIAGNOSIS — E079 Disorder of thyroid, unspecified: Secondary | ICD-10-CM | POA: Insufficient documentation

## 2016-07-20 DIAGNOSIS — F39 Unspecified mood [affective] disorder: Secondary | ICD-10-CM | POA: Diagnosis not present

## 2016-07-20 DIAGNOSIS — K219 Gastro-esophageal reflux disease without esophagitis: Secondary | ICD-10-CM | POA: Diagnosis not present

## 2016-07-20 DIAGNOSIS — R1310 Dysphagia, unspecified: Secondary | ICD-10-CM | POA: Diagnosis not present

## 2016-07-20 DIAGNOSIS — G250 Essential tremor: Secondary | ICD-10-CM | POA: Insufficient documentation

## 2016-07-20 DIAGNOSIS — N400 Enlarged prostate without lower urinary tract symptoms: Secondary | ICD-10-CM | POA: Diagnosis not present

## 2016-07-20 NOTE — Progress Notes (Signed)
Modified Barium Swallow Progress Note  Patient Details  Name: Randy Sullivan MRN: [id number] Date of Birth: June 12, 1928  Today's Date: 07/20/2016  Modified Barium Swallow completed.  Full report located under Chart Review in the Imaging Section.  Brief recommendations include the following:  Clinical Impression  Oral and pharyngeal phases of swallow were within normal limits. Pharyngeal contraction, laryngeal elevation and epiglottic inversion all adequate without laryngeal penetration or aspiration. Per radiologist, pt with Minimal impression upon the cervical esophagus by C4-5 anterior osteophyte. Based on symptoms SLP suspects a possible esophageal source. MBS does not diagnose below the level of the upper esophageal sphincter however brief esophageal view did not reflect obvious abnormalities of the proximal and mid esophagus. Consider GI consult for further evaluation. Recommend continue thin liquids and regular texture. Educated pt on reflux precautions.    Swallow Evaluation Recommendations   Recommended Consults: Consider GI evaluation   SLP Diet Recommendations: Regular solids;Thin liquid   Liquid Administration via: Cup;Straw   Medication Administration: Whole meds with liquid   Supervision: Patient able to self feed       Postural Changes: Seated upright at 90 degrees;Remain semi-upright after after feeds/meals (Comment)   Oral Care Recommendations: Oral care BID        Houston Siren 07/20/2016,3:43 PM   Orbie Pyo [place] [phone]

## 2016-07-23 ENCOUNTER — Emergency Department (HOSPITAL_COMMUNITY): Payer: MEDICARE

## 2016-07-23 ENCOUNTER — Encounter (HOSPITAL_COMMUNITY): Payer: Self-pay

## 2016-07-23 ENCOUNTER — Emergency Department (HOSPITAL_COMMUNITY)
Admission: EM | Admit: 2016-07-23 | Discharge: 2016-07-23 | Disposition: A | Discharge status: Home / Self Care | Payer: MEDICARE | Attending: Emergency Medicine | Admitting: Emergency Medicine

## 2016-07-23 DIAGNOSIS — S51011A Laceration without foreign body of right elbow, initial encounter: Secondary | ICD-10-CM | POA: Insufficient documentation

## 2016-07-23 DIAGNOSIS — S5001XA Contusion of right elbow, initial encounter: Secondary | ICD-10-CM | POA: Diagnosis not present

## 2016-07-23 DIAGNOSIS — I119 Hypertensive heart disease without heart failure: Secondary | ICD-10-CM | POA: Insufficient documentation

## 2016-07-23 DIAGNOSIS — M1712 Unilateral primary osteoarthritis, left knee: Secondary | ICD-10-CM | POA: Diagnosis not present

## 2016-07-23 DIAGNOSIS — E039 Hypothyroidism, unspecified: Secondary | ICD-10-CM | POA: Insufficient documentation

## 2016-07-23 DIAGNOSIS — Y939 Activity, unspecified: Secondary | ICD-10-CM | POA: Insufficient documentation

## 2016-07-23 DIAGNOSIS — Y999 Unspecified external cause status: Secondary | ICD-10-CM | POA: Insufficient documentation

## 2016-07-23 DIAGNOSIS — Y92480 Sidewalk as the place of occurrence of the external cause: Secondary | ICD-10-CM | POA: Diagnosis not present

## 2016-07-23 DIAGNOSIS — Z7982 Long term (current) use of aspirin: Secondary | ICD-10-CM | POA: Diagnosis not present

## 2016-07-23 DIAGNOSIS — M170 Bilateral primary osteoarthritis of knee: Secondary | ICD-10-CM | POA: Diagnosis not present

## 2016-07-23 DIAGNOSIS — I10 Essential (primary) hypertension: Secondary | ICD-10-CM

## 2016-07-23 DIAGNOSIS — Z87891 Personal history of nicotine dependence: Secondary | ICD-10-CM | POA: Insufficient documentation

## 2016-07-23 DIAGNOSIS — J449 Chronic obstructive pulmonary disease, unspecified: Secondary | ICD-10-CM | POA: Insufficient documentation

## 2016-07-23 DIAGNOSIS — Z79899 Other long term (current) drug therapy: Secondary | ICD-10-CM | POA: Insufficient documentation

## 2016-07-23 DIAGNOSIS — M1711 Unilateral primary osteoarthritis, right knee: Secondary | ICD-10-CM | POA: Diagnosis not present

## 2016-07-23 DIAGNOSIS — M19021 Primary osteoarthritis, right elbow: Secondary | ICD-10-CM | POA: Diagnosis not present

## 2016-07-23 DIAGNOSIS — W1830XA Fall on same level, unspecified, initial encounter: Secondary | ICD-10-CM | POA: Diagnosis not present

## 2016-07-23 MED ORDER — BACITRACIN ZINC 500 UNIT/GM EX OINT
TOPICAL_OINTMENT | Freq: Once | CUTANEOUS | Status: DC
  Filled 2016-07-23: qty 3.6

## 2016-07-23 NOTE — ED Notes (Signed)
Pt is alert and oriented x 4 and is accompanied with wife. Pt denies pain at this time. Pt right upper arm is dressed and clover with clean and intact roll gauze.

## 2016-07-23 NOTE — ED Triage Notes (Signed)
Pt fell when he stepped of the curb at his Drs office. He presents with a skin tear to his R elbow. Denies LOC or hitting head. No blood thinners. A&Ox4. Ambulatory.

## 2016-07-23 NOTE — ED Notes (Signed)
Pt in xray

## 2016-07-23 NOTE — Discharge Instructions (Signed)
Keep wound clean with mild soap and water. Keep area covered with a topical antibiotic ointment and bandage, keep bandage dry. Ice and elevate for additional pain and swelling relief. Alternate between Ibuprofen and Tylenol for additional pain relief. Follow up with your primary care doctor in approximately 3-5 days for wound recheck and ongoing management of your skin tear and elbow wound/injury. Also mention to them that your blood pressure was elevated, and they can assess whether you need anything further done about your blood pressure. Eat a low salt diet. Monitor area for signs of infection to include, but not limited to: increasing pain, spreading redness, drainage/pus, worsening swelling, or fevers. Return to emergency department for emergent changing or worsening symptoms.

## 2016-07-23 NOTE — ED Provider Notes (Signed)
[facility] DEPT Provider Note   CSN: [id number] Arrival date & time: 07/23/16  1704     History   Chief Complaint Chief Complaint  Patient presents with  . Fall  . Skin Tear    HPI Randy Sullivan is a 83 y.o. male with a PMHx of HTN, HLD, OA, BPH, DJD, GERD, hypothyroidism, and benign essential tremor, who presents to the ED with complaints of a mechanical fall that occurred around noon when he stepped off of a high curb and misjudged how tall the curb was, causing him to land on his right elbow on the pavement. He denies any head injury, LOC, prodromal dizziness/lightheadedness, presyncopal episode, or syncopal event. He is not on any blood thinners. His last Tdap was about 5-6 years ago. He denies any pain, states that he has a skin tear to the right elbow and some associated bruising, and wasn't intending to come to the ER for treatment but because the skin tear continued to ooze blood he presented for evaluation and management. They did cleanse the wound with water and applied Neosporin. No known aggravating factors. He denies any pain, loss of range of motion of the elbow, swelling, recent fevers or chills, CP, SOB, vision changes, headache, abdominal pain, and/PE/D/C, dysuria, hematuria, numbness, tingling, focal weakness, or any other injuries or complaints at this time.   The history is provided by the patient and medical records. No language interpreter was used.  Fall  This is a new problem. The current episode started 6 to 12 hours ago. The problem occurs rarely. The problem has been resolved. Pertinent negatives include no chest pain, no abdominal pain, no headaches and no shortness of breath. Nothing aggravates the symptoms. Nothing relieves the symptoms. Treatments tried: cleaning wound. The treatment provided moderate relief.    Past Medical History:  Diagnosis Date  . BPH (benign prostatic hyperplasia)   . DJD (degenerative joint disease)   . Familial tremor   . GERD  (gastroesophageal reflux disease)   . Hyperlipidemia   . Hypertension   . Mood disorder ([place])   . Osteoarthritis   . Prostatism   . Shortness of breath   . Thyroid disease     Patient Active Problem List   Diagnosis Date Noted  . Essential hypertension 02/05/2016  . Thoracic aortic atherosclerosis ([place]) 02/05/2016  . Benign familial tremor 02/05/2016  . COPD (chronic obstructive pulmonary disease) ([place]) 01/21/2016  . OSA (obstructive sleep apnea) 10/23/2015  . Upper airway cough syndrome 10/23/2015  . Exertional dyspnea 10/23/2015  . RLS (restless legs syndrome) 10/23/2015  . Osteoarthritis of knee 06/04/2010  . Dizziness 06/04/2010  . Benign hypertensive heart disease without heart failure 06/04/2010  . Hypercholesterolemia 06/04/2010  . BPH (benign prostatic hyperplasia) 06/04/2010    Past Surgical History:  Procedure Laterality Date  . APPENDECTOMY    . HEMORROIDECTOMY         Home Medications    Prior to Admission medications   Medication Sig Start Date End Date Taking? Authorizing Provider  albuterol (PROVENTIL HFA;VENTOLIN HFA) 108 (90 Base) MCG/ACT inhaler Inhale 2 puffs into the lungs every 4 (four) hours as needed for wheezing or shortness of breath. 01/21/16   de Dios, Kittson, MD  aspirin 81 MG tablet Take 81 mg by mouth daily.      [provider]  Cholecalciferol (VITAMIN D) 1000 UNITS capsule Take 2,000 Units by mouth daily.      [provider]  fluticasone (FLONASE) 50 MCG/ACT  nasal spray Place 1 spray into both nostrils daily.    [provider]  fluticasone furoate-vilanterol (BREO ELLIPTA) 100-25 MCG/INH AEPB Inhale 1 puff into the lungs daily. 01/21/16   de Dios, Meeker, MD  gabapentin (NEURONTIN) 300 MG capsule Take 300 mg by mouth at bedtime.    [provider]  levothyroxine (SYNTHROID, LEVOTHROID) 112 MCG tablet Take 112 mcg by mouth daily before breakfast.  10/14/14   [provider]    montelukast (SINGULAIR) 10 MG tablet Take 1 tablet (10 mg total) by mouth at bedtime. 01/21/16   de Dios, Deming A, MD  MYRBETRIQ 50 MG TB24 tablet  06/03/15   [provider]  propranolol (INDERAL) 10 MG tablet Take 10 mg by mouth 2 (two) times daily.  12/14/14   [provider]  vitamin B-12 (CYANOCOBALAMIN) 1000 MCG tablet Take 1,200 mcg by mouth daily.    [provider]    Family History Family History  Problem Relation Age of Onset  . Heart attack Father   . Colon cancer Mother   . Colon cancer Sister     Social History Social History  Substance Use Topics  . Smoking status: Former Smoker    Packs/day: 2.00    Years: 18.00    Types: Cigarettes    Quit date: 06/04/1970  . Smokeless tobacco: Never Used  . Alcohol use 0.0 oz/week     Allergies   Patient has no known allergies.   Review of Systems Review of Systems  Constitutional: Negative for chills and fever.  HENT: Negative for facial swelling (no head inj).   Eyes: Negative for visual disturbance.  Respiratory: Negative for shortness of breath.   Cardiovascular: Negative for chest pain.  Gastrointestinal: Negative for abdominal pain, constipation, diarrhea, nausea and vomiting.  Genitourinary: Negative for dysuria and hematuria.  Musculoskeletal: Negative for arthralgias, joint swelling and myalgias.  Skin: Positive for color change and wound.  Allergic/Immunologic: Negative for immunocompromised state.  Neurological: Negative for syncope, weakness, light-headedness, numbness and headaches.  Hematological: Does not bruise/bleed easily.  Psychiatric/Behavioral: Negative for confusion.   All other systems reviewed and are negative for acute change except as noted in the HPI.    Physical Exam Updated Vital Signs BP (!) 179/107 (BP Location: Left Arm)   Pulse 91   Temp 97.8 F (36.6 C) (Oral)   Resp 20   Ht 6' (1.829 m)   Wt 83.9 kg (185 lb)   SpO2 96%   BMI 25.09 kg/m    Physical Exam  Constitutional: He is oriented to person, place, and time. Vital signs are normal. He appears well-developed and well-nourished.  Non-toxic appearance. No distress.  Afebrile, nontoxic, NAD  HENT:  Head: Normocephalic and atraumatic. Head is without raccoon's eyes, without Battle's sign, without abrasion and without contusion.  Mouth/Throat: Oropharynx is clear and moist and mucous membranes are normal.  [place]/AT, no abrasions/contusions, no racoon eyes or battle's sign  Eyes: Conjunctivae and EOM are normal. Pupils are equal, round, and reactive to light. Right eye exhibits no discharge. Left eye exhibits no discharge.  PERRL, EOMI, no nystagmus, no visual field deficits, IOLs visible through pupils  Neck: Normal range of motion. Neck supple. No spinous process tenderness and no muscular tenderness present. No neck rigidity. Normal range of motion present.  FROM intact without spinous process TTP, no bony stepoffs or deformities, no paraspinous muscle TTP or muscle spasms. No rigidity or meningeal signs. No bruising or swelling.  Cardiovascular: Normal rate, regular rhythm, normal heart sounds and intact distal pulses.  Exam reveals no gallop and no friction rub.   No murmur heard. Pulmonary/Chest: Effort normal and breath sounds normal. No respiratory distress. He has no decreased breath sounds. He has no wheezes. He has no rhonchi. He has no rales.  Abdominal: Soft. Normal appearance and bowel sounds are normal. He exhibits no distension. There is no tenderness. There is no rigidity, no rebound, no guarding, no CVA tenderness, no tenderness at McBurney's point and negative Potteiger's sign.  Musculoskeletal: Normal range of motion.       Right elbow: He exhibits laceration. He exhibits normal range of motion, no swelling, no effusion and no deformity. No tenderness found.  R elbow with FROM intact, no swelling or effusion, no crepitus or deformity, no focal TTP, with moderate sized  superficial skin tear to lateral epicondyle, no deep lacerations, no ongoing bleeding. No FBs or debris noted to wound. Some bruising to area around wound, and one more small skin tear and bruise to proximal forearm dorsal aspect. SEE PICTURES BELOW MAE x4 Strength and sensation grossly intact in all extremities Distal pulses intact Gait steady Benign tremor noted No spinal tenderness, crepitus, or bony stepoffs.  Neurological: He is alert and oriented to person, place, and time. He has normal strength. He displays tremor. No cranial nerve deficit or sensory deficit. Coordination and gait normal. GCS eye subscore is 4. GCS verbal subscore is 5. GCS motor subscore is 6.  CN 2-12 grossly intact A&O x4 GCS 15 Sensation and strength intact Fine essential tremor noted Gait steady Coordination with finger-to-nose WNL Neg pronator drift   Skin: Skin is warm and dry. Abrasion and bruising noted. No rash noted.  Bruising and skin tears to R elbow as mentioned above and pictured below  Psychiatric: He has a normal mood and affect.  Nursing note and vitals reviewed.        ED Treatments / Results  Labs (all labs ordered are listed, but only abnormal results are displayed) Labs Reviewed - No data to display  EKG  EKG Interpretation None       Radiology Dg Elbow Complete Right (3+view)  Result Date: 07/23/2016 CLINICAL DATA:  Recent fall with elbow pain, initial encounter EXAM: RIGHT ELBOW - COMPLETE 3+ VIEW COMPARISON:  None. FINDINGS: Degenerative changes are noted. Mild soft tissue swelling is noted posteriorly consistent with the recent injury. No significant joint effusion is seen. Spurring is noted from the olecranon. IMPRESSION: Degenerative change without acute abnormality Electronically Signed   By: Inez Catalina M.D.   On: 07/23/2016 18:42    Procedures Procedures (including critical care time)  Medications Ordered in ED Medications  bacitracin ointment (not administered)      Initial Impression / Assessment and Plan / ED Course  I have reviewed the triage vital signs and the nursing notes.  Pertinent labs & imaging results that were available during my care of the patient were reviewed by me and considered in my medical decision making (see chart for details).     83 y.o. male here with mechanical fall and R elbow skin tear, denies pain, no head inj/LOC, no prodromal presyncopal symptoms. Moderate sized skin tear to lateral R elbow, no deep lacerations or areas that would be amendable to suture or dermabond. Some bruising around skin tears. FROM intact, no swelling or crepitus, no focal tenderness, no effusion. No focal neuro deficits, pt has benign essential tremor but otherwise neuro exam  benign. Doubt need for labs, as this was a mechanical fall. Doubt need for head imaging. Pt is UTD with Tdap. Will get R elbow xray to ensure no underlying injury, and cleanse wound then apply bacitracin and nonstick dressing. Will reassess shortly. Discussed case with my attending Dr. Thurnell Garbe who agrees with plan.   8:43 PM Elbow xray negative. Wound cleansed and dressed. Advised proper wound care, doubt need for ppx abx. Discussed tylenol/motrin for pain if needed, ice/elevate for pain/swelling, and f/up with PCP in 3-5 days for recheck of skin tear. Of note, BP elevated, pt asymptomatic; hx of similar that he attributes to white coat HTN; advised DASH diet and f/up with PCP regarding this, but given asymptomatic today, doubt need for further emergent work up.  I explained the diagnosis and have given explicit precautions to return to the ER including for any other new or worsening symptoms. The patient understands and accepts the medical plan as it's been dictated and I have answered their questions. Discharge instructions concerning home care and prescriptions have been given. The patient is STABLE and is discharged to home in good condition.    Final Clinical Impressions(s) /  ED Diagnoses   Final diagnoses:  Skin tear of right elbow without complication, initial encounter  Contusion of right elbow, initial encounter  Essential hypertension    New Prescriptions New Prescriptions   No medications on [address] [place], [place] 07/23/16 2045    Francine Graven, DO 07/23/16 2330

## 2016-07-26 DIAGNOSIS — L814 Other melanin hyperpigmentation: Secondary | ICD-10-CM | POA: Diagnosis not present

## 2016-07-26 DIAGNOSIS — D1801 Hemangioma of skin and subcutaneous tissue: Secondary | ICD-10-CM | POA: Diagnosis not present

## 2016-07-26 DIAGNOSIS — L821 Other seborrheic keratosis: Secondary | ICD-10-CM | POA: Diagnosis not present

## 2016-07-26 DIAGNOSIS — Z85828 Personal history of other malignant neoplasm of skin: Secondary | ICD-10-CM | POA: Diagnosis not present

## 2016-07-27 DIAGNOSIS — R350 Frequency of micturition: Secondary | ICD-10-CM | POA: Diagnosis not present

## 2016-07-27 DIAGNOSIS — N3941 Urge incontinence: Secondary | ICD-10-CM | POA: Diagnosis not present

## 2016-07-30 DIAGNOSIS — M25571 Pain in right ankle and joints of right foot: Secondary | ICD-10-CM | POA: Diagnosis not present

## 2016-07-30 DIAGNOSIS — M25572 Pain in left ankle and joints of left foot: Secondary | ICD-10-CM | POA: Diagnosis not present

## 2016-07-30 DIAGNOSIS — G8929 Other chronic pain: Secondary | ICD-10-CM | POA: Diagnosis not present

## 2016-08-03 DIAGNOSIS — G8929 Other chronic pain: Secondary | ICD-10-CM | POA: Diagnosis not present

## 2016-08-03 DIAGNOSIS — M25572 Pain in left ankle and joints of left foot: Secondary | ICD-10-CM | POA: Diagnosis not present

## 2016-08-03 DIAGNOSIS — M25571 Pain in right ankle and joints of right foot: Secondary | ICD-10-CM | POA: Diagnosis not present

## 2016-08-06 DIAGNOSIS — M25572 Pain in left ankle and joints of left foot: Secondary | ICD-10-CM | POA: Diagnosis not present

## 2016-08-06 DIAGNOSIS — M25571 Pain in right ankle and joints of right foot: Secondary | ICD-10-CM | POA: Diagnosis not present

## 2016-08-06 DIAGNOSIS — G8929 Other chronic pain: Secondary | ICD-10-CM | POA: Diagnosis not present

## 2016-08-10 DIAGNOSIS — M25572 Pain in left ankle and joints of left foot: Secondary | ICD-10-CM | POA: Diagnosis not present

## 2016-08-10 DIAGNOSIS — G8929 Other chronic pain: Secondary | ICD-10-CM | POA: Diagnosis not present

## 2016-08-10 DIAGNOSIS — M25571 Pain in right ankle and joints of right foot: Secondary | ICD-10-CM | POA: Diagnosis not present

## 2016-08-13 DIAGNOSIS — R42 Dizziness and giddiness: Secondary | ICD-10-CM | POA: Diagnosis not present

## 2016-08-13 DIAGNOSIS — R6889 Other general symptoms and signs: Secondary | ICD-10-CM | POA: Diagnosis not present

## 2016-08-13 DIAGNOSIS — M25571 Pain in right ankle and joints of right foot: Secondary | ICD-10-CM | POA: Diagnosis not present

## 2016-08-13 DIAGNOSIS — M25572 Pain in left ankle and joints of left foot: Secondary | ICD-10-CM | POA: Diagnosis not present

## 2016-08-13 DIAGNOSIS — Z6825 Body mass index (BMI) 25.0-25.9, adult: Secondary | ICD-10-CM | POA: Diagnosis not present

## 2016-08-13 DIAGNOSIS — G8929 Other chronic pain: Secondary | ICD-10-CM | POA: Diagnosis not present

## 2016-08-13 DIAGNOSIS — R251 Tremor, unspecified: Secondary | ICD-10-CM | POA: Diagnosis not present

## 2016-08-13 DIAGNOSIS — I10 Essential (primary) hypertension: Secondary | ICD-10-CM | POA: Diagnosis not present

## 2016-08-13 DIAGNOSIS — E038 Other specified hypothyroidism: Secondary | ICD-10-CM | POA: Diagnosis not present

## 2016-08-13 DIAGNOSIS — R2681 Unsteadiness on feet: Secondary | ICD-10-CM | POA: Diagnosis not present

## 2016-08-17 DIAGNOSIS — G8929 Other chronic pain: Secondary | ICD-10-CM | POA: Diagnosis not present

## 2016-08-17 DIAGNOSIS — M25572 Pain in left ankle and joints of left foot: Secondary | ICD-10-CM | POA: Diagnosis not present

## 2016-08-17 DIAGNOSIS — M25571 Pain in right ankle and joints of right foot: Secondary | ICD-10-CM | POA: Diagnosis not present

## 2016-08-20 ENCOUNTER — Other Ambulatory Visit: Payer: Self-pay | Admitting: Adult Health

## 2016-08-20 ENCOUNTER — Telehealth: Payer: Self-pay | Admitting: Pulmonary Disease

## 2016-08-20 DIAGNOSIS — M25572 Pain in left ankle and joints of left foot: Secondary | ICD-10-CM | POA: Diagnosis not present

## 2016-08-20 DIAGNOSIS — M25571 Pain in right ankle and joints of right foot: Secondary | ICD-10-CM | POA: Diagnosis not present

## 2016-08-20 DIAGNOSIS — G8929 Other chronic pain: Secondary | ICD-10-CM | POA: Diagnosis not present

## 2016-08-20 MED ORDER — FLUTICASONE FUROATE-VILANTEROL 100-25 MCG/INH IN AEPB: 1.0000 | INHALATION_SPRAY | Freq: Every day | RESPIRATORY_TRACT | 6 refills | Status: DC

## 2016-08-20 NOTE — Telephone Encounter (Signed)
Pt wife requesting a refill of the patient's Breo 100mg .  This has been sent to [facility] under TP as Dr Corrie Dandy is no longer with our office.  Nothing further needed.

## 2016-08-24 DIAGNOSIS — G8929 Other chronic pain: Secondary | ICD-10-CM | POA: Diagnosis not present

## 2016-08-24 DIAGNOSIS — M25572 Pain in left ankle and joints of left foot: Secondary | ICD-10-CM | POA: Diagnosis not present

## 2016-08-24 DIAGNOSIS — M25571 Pain in right ankle and joints of right foot: Secondary | ICD-10-CM | POA: Diagnosis not present

## 2016-08-24 DIAGNOSIS — R350 Frequency of micturition: Secondary | ICD-10-CM | POA: Diagnosis not present

## 2016-08-24 DIAGNOSIS — N3941 Urge incontinence: Secondary | ICD-10-CM | POA: Diagnosis not present

## 2016-08-27 DIAGNOSIS — G8929 Other chronic pain: Secondary | ICD-10-CM | POA: Diagnosis not present

## 2016-08-27 DIAGNOSIS — M25571 Pain in right ankle and joints of right foot: Secondary | ICD-10-CM | POA: Diagnosis not present

## 2016-08-27 DIAGNOSIS — M25572 Pain in left ankle and joints of left foot: Secondary | ICD-10-CM | POA: Diagnosis not present

## 2016-08-30 DIAGNOSIS — R0609 Other forms of dyspnea: Secondary | ICD-10-CM | POA: Diagnosis not present

## 2016-08-30 DIAGNOSIS — I10 Essential (primary) hypertension: Secondary | ICD-10-CM | POA: Diagnosis not present

## 2016-08-30 DIAGNOSIS — G2581 Restless legs syndrome: Secondary | ICD-10-CM | POA: Diagnosis not present

## 2016-08-30 DIAGNOSIS — E038 Other specified hypothyroidism: Secondary | ICD-10-CM | POA: Diagnosis not present

## 2016-08-30 DIAGNOSIS — I519 Heart disease, unspecified: Secondary | ICD-10-CM | POA: Diagnosis not present

## 2016-08-30 DIAGNOSIS — R5383 Other fatigue: Secondary | ICD-10-CM | POA: Diagnosis not present

## 2016-08-30 DIAGNOSIS — G4733 Obstructive sleep apnea (adult) (pediatric): Secondary | ICD-10-CM | POA: Diagnosis not present

## 2016-08-30 DIAGNOSIS — R531 Weakness: Secondary | ICD-10-CM | POA: Diagnosis not present

## 2016-08-30 DIAGNOSIS — R1310 Dysphagia, unspecified: Secondary | ICD-10-CM | POA: Diagnosis not present

## 2016-08-30 DIAGNOSIS — Z6825 Body mass index (BMI) 25.0-25.9, adult: Secondary | ICD-10-CM | POA: Diagnosis not present

## 2016-08-31 DIAGNOSIS — M25572 Pain in left ankle and joints of left foot: Secondary | ICD-10-CM | POA: Diagnosis not present

## 2016-08-31 DIAGNOSIS — M25571 Pain in right ankle and joints of right foot: Secondary | ICD-10-CM | POA: Diagnosis not present

## 2016-08-31 DIAGNOSIS — G8929 Other chronic pain: Secondary | ICD-10-CM | POA: Diagnosis not present

## 2016-08-31 DIAGNOSIS — M170 Bilateral primary osteoarthritis of knee: Secondary | ICD-10-CM | POA: Diagnosis not present

## 2016-09-07 ENCOUNTER — Other Ambulatory Visit: Payer: Self-pay | Admitting: *Deleted

## 2016-09-07 MED ORDER — MONTELUKAST SODIUM 10 MG PO TABS: 10.0000 mg | ORAL_TABLET | Freq: Every day | ORAL | 6 refills | Status: DC

## 2016-09-14 DIAGNOSIS — R3915 Urgency of urination: Secondary | ICD-10-CM | POA: Diagnosis not present

## 2016-09-15 ENCOUNTER — Telehealth: Payer: Self-pay

## 2016-09-15 ENCOUNTER — Encounter: Payer: Self-pay | Admitting: Cardiovascular Disease

## 2016-09-15 NOTE — Telephone Encounter (Signed)
epicd 

## 2016-09-15 NOTE — Telephone Encounter (Signed)
1. Type of surgery: left TKA-medial and lateral w/wo patella resurfacing 2. Date of surgery: 11/08/16 3. Surgeon: Dr Hector Shade 4. Medications that need to be held & how long: NA; pt is taking an ASA 81 mg QD per chart 5. Fax and/or Phone: (p) [phone] (f) [phone]

## 2016-09-16 NOTE — Telephone Encounter (Signed)
Clearance letter faxed via epic.

## 2016-09-24 DIAGNOSIS — E038 Other specified hypothyroidism: Secondary | ICD-10-CM | POA: Diagnosis not present

## 2016-09-24 DIAGNOSIS — I10 Essential (primary) hypertension: Secondary | ICD-10-CM | POA: Diagnosis not present

## 2016-09-24 DIAGNOSIS — R251 Tremor, unspecified: Secondary | ICD-10-CM | POA: Diagnosis not present

## 2016-09-24 DIAGNOSIS — R6889 Other general symptoms and signs: Secondary | ICD-10-CM | POA: Diagnosis not present

## 2016-09-24 DIAGNOSIS — G4733 Obstructive sleep apnea (adult) (pediatric): Secondary | ICD-10-CM | POA: Diagnosis not present

## 2016-09-24 DIAGNOSIS — Z6825 Body mass index (BMI) 25.0-25.9, adult: Secondary | ICD-10-CM | POA: Diagnosis not present

## 2016-09-24 DIAGNOSIS — E559 Vitamin D deficiency, unspecified: Secondary | ICD-10-CM | POA: Diagnosis not present

## 2016-09-24 DIAGNOSIS — R42 Dizziness and giddiness: Secondary | ICD-10-CM | POA: Diagnosis not present

## 2016-10-04 ENCOUNTER — Ambulatory Visit: Payer: Self-pay | Admitting: Orthopedic Surgery

## 2016-10-05 DIAGNOSIS — N3941 Urge incontinence: Secondary | ICD-10-CM | POA: Diagnosis not present

## 2016-10-21 DIAGNOSIS — R251 Tremor, unspecified: Secondary | ICD-10-CM | POA: Diagnosis not present

## 2016-10-21 DIAGNOSIS — Z23 Encounter for immunization: Secondary | ICD-10-CM | POA: Diagnosis not present

## 2016-10-21 DIAGNOSIS — R42 Dizziness and giddiness: Secondary | ICD-10-CM | POA: Diagnosis not present

## 2016-10-21 DIAGNOSIS — E038 Other specified hypothyroidism: Secondary | ICD-10-CM | POA: Diagnosis not present

## 2016-10-21 DIAGNOSIS — R6889 Other general symptoms and signs: Secondary | ICD-10-CM | POA: Diagnosis not present

## 2016-10-21 DIAGNOSIS — I7300 Raynaud's syndrome without gangrene: Secondary | ICD-10-CM | POA: Diagnosis not present

## 2016-10-21 DIAGNOSIS — I519 Heart disease, unspecified: Secondary | ICD-10-CM | POA: Diagnosis not present

## 2016-10-21 DIAGNOSIS — G4733 Obstructive sleep apnea (adult) (pediatric): Secondary | ICD-10-CM | POA: Diagnosis not present

## 2016-10-21 DIAGNOSIS — E559 Vitamin D deficiency, unspecified: Secondary | ICD-10-CM | POA: Diagnosis not present

## 2016-10-21 DIAGNOSIS — M1990 Unspecified osteoarthritis, unspecified site: Secondary | ICD-10-CM | POA: Diagnosis not present

## 2016-10-21 DIAGNOSIS — I10 Essential (primary) hypertension: Secondary | ICD-10-CM | POA: Diagnosis not present

## 2016-10-21 DIAGNOSIS — R2681 Unsteadiness on feet: Secondary | ICD-10-CM | POA: Diagnosis not present

## 2016-10-25 ENCOUNTER — Ambulatory Visit: Payer: Self-pay | Admitting: Orthopedic Surgery

## 2016-10-25 NOTE — H&P (Signed)
Randy Sullivan DOB: 27-Mar-1928 Married / Language: Randy Sullivan / Race: White Male Date of admission: November 08, 2016 Chief complaint: left knee pain History of Present Illness  The patient is a 83 year old male who comes in for a preoperative History and Physical. The patient is scheduled for a left total knee arthroplasty to be performed by Dr. Dione Plover. Aluisio, MD at [facility] on 11/08/2016. The patient is a 83 year old male who presented for follow up of their knee. The patient is being followed for their bilateral knee pain and osteoarthritis. They are now months out from aspiration and injections in both knees (that did not provide much relief). Symptoms reported tod include: pain and swelling (especially in the left knee). The following medication has been used for pain control: none. Note: his LEFT knee is far more symptomatic than the RIGHT. Is a recurrent pain and swelling. Cortisone and Visco supplements have not been of benefit. He feels as though the knee is essentially taken over what he can and cannot do. He has had a stage was ready to go ahead and get the knee fixed. They have been treated conservatively in the past for the above stated problem and despite conservative measures, they continue to have progressive pain and severe functional limitations and dysfunction. They have failed non-operative management including home exercise, medications, and injections. It is felt that they would benefit from undergoing total joint replacement. Risks and benefits of the procedure have been discussed with the patient and they elect to proceed with surgery. There are no active contraindications to surgery such as ongoing infection or rapidly progressive neurological disease.   Problem List/Past Medical  Sprain of deltoid ligament of left ankle, initial encounter (W09.811B)  Acute pain of right knee (M25.561)  Chronic pain of right ankle (M25.571)  Primary osteoarthritis of right  knee (M17.11)  Sprain of anterior talofibular ligament, right, initial encounter (J47.829F)  Wrist pain, acute, right (M25.531)  Hypothyroidism  Skin Cancer  Hypercholesterolemia  High blood pressure  Sleep Apnea  uses CPAP Impaired Hearing  bilateral hearing aids  Allergies  No Known Drug Allergies   Family History Cancer  mother Heart Disease  father Heart disease in male family member before age 68   Social History Drug/Alcohol Rehab (Currently)  no Drug/Alcohol Rehab (Previously)  no Current work status  retired Alcohol use  Drinks wine. current drinker Children  0 Illicit drug use  no Tobacco / smoke exposure  no Tobacco use  Former smoker. former smoker Pain Contract  no Living situation  live with spouse Marital status  married  Medication History  Loratadine (10MG  Tablet, Oral) Active. Systane (0.4-0.3% Gel, Ophthalmic) Active. Fluticasone Propionate (50MCG/ACT Suspension, Nasal as needed) Active. Gabapentin (100MG  Capsule, Oral) Active. Breo Ellipta (100-25MCG/INH Aero Pow Br Act, Inhalation) Active. Myrbetriq (25MG  Tablet ER 24HR, Oral) Active. Propranolol HCl (10MG  Tablet, Oral) Active. Vitamin B12 (100MCG Tablet, Oral) Active. Vitamin D (1000UNIT Tablet, Oral) Active. Aspirin (81MG  Tablet, 1 Oral) Active. Synthroid (100MCG Tablet, Oral) Active. Crestor (Oral) Specific strength unknown - Active.  Past Surgical History  Tonsillectomy  Appendectomy  Colon Polyp Removal - Colonoscopy  Hemorrhoidectomy     Review of Systems  General Not Present- Chills, Fatigue, Fever, Memory Loss, Night Sweats, Weight Gain and Weight Loss. Skin Not Present- Eczema, Hives, Itching, Lesions and Rash. HEENT Not Present- Dentures, Double Vision, Headache, Hearing Loss, Tinnitus and Visual Loss. Respiratory Not Present- Allergies, Chronic Cough, Coughing up blood, Shortness of breath  at rest and Shortness of breath with  exertion. Cardiovascular Not Present- Chest Pain, Difficulty Breathing Lying Down, Murmur, Palpitations, Racing/skipping heartbeats and Swelling. Gastrointestinal Not Present- Abdominal Pain, Bloody Stool, Constipation, Diarrhea, Difficulty Swallowing, Heartburn, Jaundice, Loss of appetitie, Nausea and Vomiting. Male Genitourinary Not Present- Blood in Urine, Discharge, Flank Pain, Incontinence, Painful Urination, Urgency, Urinary frequency, Urinary Retention, Urinating at Night and Weak urinary stream. Musculoskeletal Present- Joint Pain. Not Present- Back Pain, Joint Swelling, Morning Stiffness, Muscle Pain, Muscle Weakness and Spasms. Neurological Not Present- Blackout spells, Difficulty with balance, Dizziness, Paralysis, Tremor and Weakness. Psychiatric Not Present- Insomnia.  Vitals  Weight: 180 lb Height: 69.5in Weight was reported by patient. Height was reported by patient. Body Surface Area: 1.99 m Body Mass Index: 26.2 kg/m  Pulse: 84 (Regular)  Resp.: 16 (Unlabored)  BP: 134/68 (Sitting, Right Arm, Standard)       Physical Exam General Mental Status -Alert, cooperative and good historian. General Appearance-pleasant, Not in acute distress. Orientation-Oriented X3. Build & Nutrition-Well nourished and Well developed.  Head and Neck Head-normocephalic, atraumatic . Neck Global Assessment - supple, no bruit auscultated on the right, no bruit auscultated on the left.  Eye Vision-Wears corrective lenses(readers). Pupil - Bilateral-Regular and Round. Motion - Bilateral-EOMI.  ENMT Note: upper and lower partial denture plates   Chest and Lung Exam Auscultation Breath sounds - clear at anterior chest wall and clear at posterior chest wall. Adventitious sounds - No Adventitious sounds.  Cardiovascular Auscultation Rhythm - Regular rate and rhythm. Heart Sounds - S1 WNL and S2 WNL. Murmurs & Other Heart Sounds - Auscultation of the heart  reveals - No Murmurs.  Abdomen Palpation/Percussion Tenderness - Abdomen is non-tender to palpation. Rigidity (guarding) - Abdomen is soft. Auscultation Auscultation of the abdomen reveals - Bowel sounds normal.  Male Genitourinary Note: Not done, not pertinent to present illness   Musculoskeletal Note: Well-developed male alert and oriented in no apparent distress.Evaluation of the left hip shows flexion to 120 rotation in 30 out 40 and abduction 40 without discomfort. There is no tenderness over the greater trochanter. There is no pain on provocative testing of the hip.Examination of the right hip shows flexion to 120 rotation in 30 abduction 40 and external rotation of 40. There is no tenderness over the greater trochanter. There is no pain on provocative testing of the hip.Marland Kitchen His LEFT knee shows a slight effusion. Range of motion is 5-125. There is moderate crepitus on range of motion of the knee. He has tenderness medial greater than lateral. There is no instability. RIGHT knee no effusion range 0-125 moderate crepitus on range of motion tender medial greater lateral with no instability.   Assessment & Plan Primary osteoarthritis of one knee, left (M17.12)  Note:Surgical Plans: Left Total Knee Replacement  Disposition: Home, Straight to outpatient at [facility]  PCP: Dr. Dagmar Hait - pending Cards: Dr. Sallyanne Kuster - Patient has been seen preoperatively and felt to be stable for surgery. "...aspirin can be held 5 days prior to the procedure. Please ensure that propranolol is not interrupted during the perioperative period."  IV TXA  Anesthesia Issues: None  Patient was instructed on what medications to stop prior to surgery.  Signed electronically by Joelene Millin, III PA-C

## 2016-10-26 DIAGNOSIS — R350 Frequency of micturition: Secondary | ICD-10-CM | POA: Diagnosis not present

## 2016-10-26 DIAGNOSIS — N3941 Urge incontinence: Secondary | ICD-10-CM | POA: Diagnosis not present

## 2016-11-01 ENCOUNTER — Encounter (HOSPITAL_COMMUNITY)
Admission: RE | Admit: 2016-11-01 | Discharge: 2016-11-01 | Disposition: A | Discharge status: Home / Self Care | Payer: MEDICARE | Source: Ambulatory Visit | Attending: Orthopedic Surgery | Admitting: Orthopedic Surgery

## 2016-11-01 ENCOUNTER — Encounter (HOSPITAL_COMMUNITY): Payer: Self-pay

## 2016-11-01 DIAGNOSIS — Z01818 Encounter for other preprocedural examination: Secondary | ICD-10-CM | POA: Diagnosis not present

## 2016-11-01 DIAGNOSIS — M1712 Unilateral primary osteoarthritis, left knee: Secondary | ICD-10-CM | POA: Insufficient documentation

## 2016-11-01 HISTORY — DX: Cardiac murmur, unspecified: R01.1

## 2016-11-01 HISTORY — DX: Malignant (primary) neoplasm, unspecified: C80.1

## 2016-11-01 HISTORY — DX: Hypothyroidism, unspecified: E03.9

## 2016-11-01 HISTORY — DX: Sleep apnea, unspecified: G47.30

## 2016-11-01 LAB — COMPREHENSIVE METABOLIC PANEL
ALBUMIN: 3.9 g/dL (ref 3.5–5.0)
ALT: 13 U/L — ABNORMAL LOW (ref 17–63)
ANION GAP: 8 (ref 5–15)
AST: 19 U/L (ref 15–41)
Alkaline Phosphatase: 63 U/L (ref 38–126)
BILIRUBIN TOTAL: 0.6 mg/dL (ref 0.3–1.2)
BUN: 22 mg/dL — ABNORMAL HIGH (ref 6–20)
CALCIUM: 9.3 mg/dL (ref 8.9–10.3)
CO2: 21 mmol/L — ABNORMAL LOW (ref 22–32)
Chloride: 104 mmol/L (ref 101–111)
Creatinine, Ser: 1.17 mg/dL (ref 0.61–1.24)
GFR calc non Af Amer: 55 mL/min — ABNORMAL LOW (ref >60)
GLUCOSE: 97 mg/dL (ref 65–99)
Potassium: 4.5 mmol/L (ref 3.5–5.1)
Sodium: 133 mmol/L — ABNORMAL LOW (ref 135–145)
TOTAL PROTEIN: 7.2 g/dL (ref 6.5–8.1)

## 2016-11-01 LAB — SURGICAL PCR SCREEN
MRSA, PCR: NEGATIVE
Staphylococcus aureus: NEGATIVE

## 2016-11-01 LAB — CBC
HEMATOCRIT: 34 % — AB (ref 39.0–52.0)
Hemoglobin: 12.4 g/dL — ABNORMAL LOW (ref 13.0–17.0)
MCH: 32 pg (ref 26.0–34.0)
MCHC: 36.5 g/dL — AB (ref 30.0–36.0)
MCV: 87.9 fL (ref 78.0–100.0)
Platelets: 248 10*3/uL (ref 150–400)
RBC: 3.87 MIL/uL — ABNORMAL LOW (ref 4.22–5.81)
RDW: 12.9 % (ref 11.5–15.5)
WBC: 8.6 10*3/uL (ref 4.0–10.5)

## 2016-11-01 LAB — APTT: APTT: 32 s (ref 24–36)

## 2016-11-01 LAB — PROTIME-INR
INR: 1.03
Prothrombin Time: 13.4 seconds (ref 11.4–15.2)

## 2016-11-01 LAB — ABO/RH: ABO/RH(D): O NEG

## 2016-11-01 NOTE — Patient Instructions (Signed)
Randy Sullivan  11/01/2016   Your procedure is scheduled on:  11/08/16  Report to [facility] Main  Entrance   Report to admitting at   1115 AM   Call this number if you have problems the morning of surgery  [phone]   Remember: ONLY 1 PERSON MAY GO WITH YOU TO SHORT STAY TO GET  READY MORNING OF YOUR SURGERY.  Do not eat food  :After Midnight. You may have clear liquids until 0845 am then nothing by mouth     Take these medicines the morning of surgery with A SIP OF WATER:   Propanalol, omeprazole, levothyroxine, gabapentin, loratadine, mybertric                                You may not have any metal on your body including hair pins and              piercings  Do not wear jewelry,, lotions, powders or perfumes, deodorant                    Men may shave face and neck.   Do not bring valuables to the hospital. [place].  Contacts, dentures or bridgework may not be worn into surgery.  Leave suitcase in the car. After surgery it may be brought to your room.                  Please read over the following fact sheets you were given: _____________________________________________________________________     CLEAR LIQUID DIET  Till 0845 then nothing by mouth   Foods Allowed                                                                     Foods Excluded  Coffee and tea, regular and decaf                             liquids that you cannot  Plain Jell-O in any flavor                                             see through such as: Fruit ices (not with fruit pulp)                                     milk, soups, orange juice  Iced Popsicles                                    All solid food Carbonated beverages, regular and diet  Cranberry, grape and apple juices Sports drinks like Gatorade Lightly seasoned clear broth or consume(fat free) Sugar, honey  syrup  Sample Menu Breakfast                                Lunch                                     Supper Cranberry juice                    Beef broth                            Chicken broth Jell-O                                     Grape juice                           Apple juice Coffee or tea                        Jell-O                                      Popsicle                                                Coffee or tea                        Coffee or tea  _____________________________________________________________________             [facility] - Preparing for Surgery Before surgery, you can play an important role.  Because skin is not sterile, your skin needs to be as free of germs as possible.  You can reduce the number of germs on your skin by washing with CHG (chlorahexidine gluconate) soap before surgery.  CHG is an antiseptic cleaner which kills germs and bonds with the skin to continue killing germs even after washing. Please DO NOT use if you have an allergy to CHG or antibacterial soaps.  If your skin becomes reddened/irritated stop using the CHG and inform your nurse when you arrive at Short Stay. Do not shave (including legs and underarms) for at least 48 hours prior to the first CHG shower.  You may shave your face/neck. Please follow these instructions carefully:  1.  Shower with CHG Soap the night before surgery and the  morning of Surgery.  2.  If you choose to wash your hair, wash your hair first as usual with your  normal  shampoo.  3.  After you shampoo, rinse your hair and body thoroughly to remove the  shampoo.                           4.  Use CHG as you would any other liquid soap.  You can apply chg  directly  to the skin and wash                       Gently with a scrungie or clean washcloth.  5.  Apply the CHG Soap to your body ONLY FROM THE NECK DOWN.   Do not use on face/ open                           Wound or open sores. Avoid contact with  eyes, ears mouth and genitals (private parts).                       Wash face,  Genitals (private parts) with your normal soap.             6.  Wash thoroughly, paying special attention to the area where your surgery  will be performed.  7.  Thoroughly rinse your body with warm water from the neck down.  8.  DO NOT shower/wash with your normal soap after using and rinsing off  the CHG Soap.                9.  Pat yourself dry with a clean towel.            10.  Wear clean pajamas.            11.  Place clean sheets on your bed the night of your first shower and do not  sleep with pets. Day of Surgery : Do not apply any lotions/deodorants the morning of surgery.  Please wear clean clothes to the hospital/surgery center.  FAILURE TO FOLLOW THESE INSTRUCTIONS MAY RESULT IN THE CANCELLATION OF YOUR SURGERY PATIENT SIGNATURE_________________________________  NURSE SIGNATURE__________________________________  ________________________________________________________________________  WHAT IS A BLOOD TRANSFUSION? Blood Transfusion Information  A transfusion is the replacement of blood or some of its parts. Blood is made up of multiple cells which provide different functions.  Red blood cells carry oxygen and are used for blood loss replacement.  White blood cells fight against infection.  Platelets control bleeding.  Plasma helps clot blood.  Other blood products are available for specialized needs, such as hemophilia or other clotting disorders. BEFORE THE TRANSFUSION  Who gives blood for transfusions?   Healthy volunteers who are fully evaluated to make sure their blood is safe. This is blood bank blood. Transfusion therapy is the safest it has ever been in the practice of medicine. Before blood is taken from a donor, a complete history is taken to make sure that person has no history of diseases nor engages in risky social behavior (examples are intravenous drug use or sexual activity  with multiple partners). The donor's travel history is screened to minimize risk of transmitting infections, such as malaria. The donated blood is tested for signs of infectious diseases, such as HIV and hepatitis. The blood is then tested to be sure it is compatible with you in order to minimize the chance of a transfusion reaction. If you or a relative donates blood, this is often done in anticipation of surgery and is not appropriate for emergency situations. It takes many days to process the donated blood. RISKS AND COMPLICATIONS Although transfusion therapy is very safe and saves many lives, the main dangers of transfusion include:   Getting an infectious disease.  Developing a transfusion reaction. This is an allergic reaction to something in the blood you were given. Every precaution is  taken to prevent this. The decision to have a blood transfusion has been considered carefully by your caregiver before blood is given. Blood is not given unless the benefits outweigh the risks. AFTER THE TRANSFUSION  Right after receiving a blood transfusion, you will usually feel much better and more energetic. This is especially true if your red blood cells have gotten low (anemic). The transfusion raises the level of the red blood cells which carry oxygen, and this usually causes an energy increase.  The nurse administering the transfusion will monitor you carefully for complications. HOME CARE INSTRUCTIONS  No special instructions are needed after a transfusion. You may find your energy is better. Speak with your caregiver about any limitations on activity for underlying diseases you may have. SEEK MEDICAL CARE IF:   Your condition is not improving after your transfusion.  You develop redness or irritation at the intravenous (IV) site. SEEK IMMEDIATE MEDICAL CARE IF:  Any of the following symptoms occur over the next 12 hours:  Shaking chills.  You have a temperature by mouth above 102 F (38.9  C), not controlled by medicine.  Chest, back, or muscle pain.  People around you feel you are not acting correctly or are confused.  Shortness of breath or difficulty breathing.  Dizziness and fainting.  You get a rash or develop hives.  You have a decrease in urine output.  Your urine turns a dark color or changes to pink, red, or brown. Any of the following symptoms occur over the next 10 days:  You have a temperature by mouth above 102 F (38.9 C), not controlled by medicine.  Shortness of breath.  Weakness after normal activity.  The white part of the eye turns yellow (jaundice).  You have a decrease in the amount of urine or are urinating less often.  Your urine turns a dark color or changes to pink, red, or brown. Document Released: 02/06/2000 Document Revised: 05/03/2011 Document Reviewed: 09/25/2007 ExitCare Patient Information 2014 [place]  An incentive spirometer is a tool that can help keep your lungs clear and active. This tool measures how well you are filling your lungs with each breath. Taking long deep breaths may help reverse or decrease the chance of developing breathing (pulmonary) problems (especially infection) following:  A long period of time when you are unable to move or be active. BEFORE THE PROCEDURE   If the spirometer includes an indicator to show your best effort, your nurse or respiratory therapist will set it to a desired goal.  If possible, sit up straight or lean slightly forward. Try not to slouch.  Hold the incentive spirometer in an upright position. INSTRUCTIONS FOR USE  1. Sit on the edge of your bed if possible, or sit up as far as you can in bed or on a chair. 2. Hold the incentive spirometer in an upright position. 3. Breathe out normally. 4. Place the mouthpiece in your mouth and seal your lips tightly around it. 5. Breathe in slowly and as deeply as possible, raising the piston or the ball  toward the top of the column. 6. Hold your breath for 3-5 seconds or for as long as possible. Allow the piston or ball to fall to the bottom of the column. 7. Remove the mouthpiece from your mouth and breathe out normally. 8. Rest for a few seconds and repeat Steps 1 through 7 at least 10 times every 1-2 hours when you are awake. Take your  time and take a few normal breaths between deep breaths. 9. The spirometer may include an indicator to show your best effort. Use the indicator as a goal to work toward during each repetition. 10. After each set of 10 deep breaths, practice coughing to be sure your lungs are clear. If you have an incision (the cut made at the time of surgery), support your incision when coughing by placing a pillow or rolled up towels firmly against it. Once you are able to get out of bed, walk around indoors and cough well. You may stop using the incentive spirometer when instructed by your caregiver.  RISKS AND COMPLICATIONS  Take your time so you do not get dizzy or light-headed.  If you are in pain, you may need to take or ask for pain medication before doing incentive spirometry. It is harder to take a deep breath if you are having pain. AFTER USE  Rest and breathe slowly and easily.  It can be helpful to keep track of a log of your progress. Your caregiver can provide you with a simple table to help with this. If you are using the spirometer at home, follow these instructions: [place] IF:   You are having difficultly using the spirometer.  You have trouble using the spirometer as often as instructed.  Your pain medication is not giving enough relief while using the spirometer.  You develop fever of 100.5 F (38.1 C) or higher. SEEK IMMEDIATE MEDICAL CARE IF:   You cough up bloody sputum that had not been present before.  You develop fever of 102 F (38.9 C) or greater.  You develop worsening pain at or near the incision site. MAKE SURE YOU:    Understand these instructions.  Will watch your condition.  Will get help right away if you are not doing well or get worse. Document Released: 06/21/2006 Document Revised: 05/03/2011 Document Reviewed: 08/22/2006 [facility] Patient Information 2014 ExitCare, [place].   ________________________________________________________________________ ______________________________________________________________________

## 2016-11-01 NOTE — Progress Notes (Signed)
EKG 02/05/16 epic Clearance Dr. Sallyanne Kuster 09/15/16 chart

## 2016-11-04 NOTE — Progress Notes (Signed)
Spoke with pt. On phone about surgery time change . Pt. Made aware to be at admitting at 0700 am on 11/08/16 . And to NOT eat eor drink after midnight 11/07/16. Pt. VU

## 2016-11-08 ENCOUNTER — Inpatient Hospital Stay (HOSPITAL_COMMUNITY): Payer: MEDICARE | Admitting: Anesthesiology

## 2016-11-08 ENCOUNTER — Inpatient Hospital Stay (HOSPITAL_COMMUNITY)
Admission: RE | Admit: 2016-11-08 | Discharge: 2016-11-12 | DRG: 470 | Disposition: A | Discharge status: Home / Self Care | Payer: MEDICARE | Source: Ambulatory Visit | Attending: Orthopedic Surgery | Admitting: Orthopedic Surgery

## 2016-11-08 ENCOUNTER — Encounter (HOSPITAL_COMMUNITY): Payer: Self-pay

## 2016-11-08 ENCOUNTER — Encounter (HOSPITAL_COMMUNITY)
Admission: RE | Disposition: A | Discharge status: Home / Self Care | Payer: Self-pay | Source: Ambulatory Visit | Attending: Orthopedic Surgery

## 2016-11-08 DIAGNOSIS — L03116 Cellulitis of left lower limb: Secondary | ICD-10-CM | POA: Diagnosis not present

## 2016-11-08 DIAGNOSIS — Z86010 Personal history of colonic polyps: Secondary | ICD-10-CM

## 2016-11-08 DIAGNOSIS — Z8249 Family history of ischemic heart disease and other diseases of the circulatory system: Secondary | ICD-10-CM | POA: Diagnosis not present

## 2016-11-08 DIAGNOSIS — Z9049 Acquired absence of other specified parts of digestive tract: Secondary | ICD-10-CM | POA: Diagnosis not present

## 2016-11-08 DIAGNOSIS — Z7982 Long term (current) use of aspirin: Secondary | ICD-10-CM

## 2016-11-08 DIAGNOSIS — M1712 Unilateral primary osteoarthritis, left knee: Principal | ICD-10-CM | POA: Diagnosis present

## 2016-11-08 DIAGNOSIS — R278 Other lack of coordination: Secondary | ICD-10-CM | POA: Diagnosis not present

## 2016-11-08 DIAGNOSIS — M1710 Unilateral primary osteoarthritis, unspecified knee: Secondary | ICD-10-CM | POA: Diagnosis present

## 2016-11-08 DIAGNOSIS — R509 Fever, unspecified: Secondary | ICD-10-CM | POA: Diagnosis not present

## 2016-11-08 DIAGNOSIS — Z85828 Personal history of other malignant neoplasm of skin: Secondary | ICD-10-CM | POA: Diagnosis not present

## 2016-11-08 DIAGNOSIS — Z87891 Personal history of nicotine dependence: Secondary | ICD-10-CM

## 2016-11-08 DIAGNOSIS — G4730 Sleep apnea, unspecified: Secondary | ICD-10-CM | POA: Diagnosis present

## 2016-11-08 DIAGNOSIS — I10 Essential (primary) hypertension: Secondary | ICD-10-CM | POA: Diagnosis present

## 2016-11-08 DIAGNOSIS — R262 Difficulty in walking, not elsewhere classified: Secondary | ICD-10-CM | POA: Diagnosis not present

## 2016-11-08 DIAGNOSIS — M6281 Muscle weakness (generalized): Secondary | ICD-10-CM | POA: Diagnosis not present

## 2016-11-08 DIAGNOSIS — K219 Gastro-esophageal reflux disease without esophagitis: Secondary | ICD-10-CM | POA: Diagnosis present

## 2016-11-08 DIAGNOSIS — E039 Hypothyroidism, unspecified: Secondary | ICD-10-CM | POA: Diagnosis present

## 2016-11-08 DIAGNOSIS — M179 Osteoarthritis of knee, unspecified: Secondary | ICD-10-CM | POA: Diagnosis present

## 2016-11-08 DIAGNOSIS — Z809 Family history of malignant neoplasm, unspecified: Secondary | ICD-10-CM

## 2016-11-08 DIAGNOSIS — R011 Cardiac murmur, unspecified: Secondary | ICD-10-CM | POA: Diagnosis present

## 2016-11-08 DIAGNOSIS — Z471 Aftercare following joint replacement surgery: Secondary | ICD-10-CM | POA: Diagnosis not present

## 2016-11-08 DIAGNOSIS — M25562 Pain in left knee: Secondary | ICD-10-CM | POA: Diagnosis not present

## 2016-11-08 DIAGNOSIS — Z79899 Other long term (current) drug therapy: Secondary | ICD-10-CM | POA: Diagnosis not present

## 2016-11-08 DIAGNOSIS — Z96652 Presence of left artificial knee joint: Secondary | ICD-10-CM | POA: Diagnosis not present

## 2016-11-08 DIAGNOSIS — G4733 Obstructive sleep apnea (adult) (pediatric): Secondary | ICD-10-CM | POA: Diagnosis not present

## 2016-11-08 DIAGNOSIS — G8918 Other acute postprocedural pain: Secondary | ICD-10-CM | POA: Diagnosis not present

## 2016-11-08 HISTORY — PX: TOTAL KNEE ARTHROPLASTY: SHX125

## 2016-11-08 LAB — TYPE AND SCREEN
ABO/RH(D): O NEG
ANTIBODY SCREEN: NEGATIVE

## 2016-11-08 SURGERY — ARTHROPLASTY, KNEE, TOTAL
Anesthesia: Spinal | Site: Knee | Laterality: Left

## 2016-11-08 MED ORDER — METOCLOPRAMIDE HCL 5 MG PO TABS: 5.0000 mg | ORAL_TABLET | Freq: Three times a day (TID) | ORAL | Status: DC | PRN

## 2016-11-08 MED ORDER — TRAMADOL HCL 50 MG PO TABS
50.0000 mg | ORAL_TABLET | Freq: Four times a day (QID) | ORAL | Status: DC | PRN
  Administered 2016-11-09: 04:00:00 100 mg via ORAL
  Filled 2016-11-08: qty 2

## 2016-11-08 MED ORDER — ACETAMINOPHEN 325 MG PO TABS
650.0000 mg | ORAL_TABLET | Freq: Four times a day (QID) | ORAL | Status: DC | PRN
  Administered 2016-11-09 – 2016-11-11 (×4): 650 mg via ORAL
  Filled 2016-11-08 (×4): qty 2

## 2016-11-08 MED ORDER — BUPIVACAINE LIPOSOME 1.3 % IJ SUSP
20.0000 mL | Freq: Once | INTRAMUSCULAR | Status: DC
  Filled 2016-11-08: qty 20

## 2016-11-08 MED ORDER — MORPHINE SULFATE (PF) 4 MG/ML IV SOLN: 1.0000 mg | INTRAVENOUS | Status: DC | PRN

## 2016-11-08 MED ORDER — FENTANYL CITRATE (PF) 100 MCG/2ML IJ SOLN
INTRAMUSCULAR | Status: DC | PRN
  Administered 2016-11-08: 50 ug via INTRAVENOUS

## 2016-11-08 MED ORDER — POLYETHYLENE GLYCOL 3350 17 G PO PACK: 17.0000 g | PACK | Freq: Every day | ORAL | Status: DC | PRN

## 2016-11-08 MED ORDER — PROPOFOL 10 MG/ML IV BOLUS
INTRAVENOUS | Status: AC
  Filled 2016-11-08: qty 40

## 2016-11-08 MED ORDER — ROPIVACAINE HCL 7.5 MG/ML IJ SOLN
INTRAMUSCULAR | Status: DC | PRN
  Administered 2016-11-08: 20 mL via PERINEURAL

## 2016-11-08 MED ORDER — GABAPENTIN 300 MG PO CAPS
300.0000 mg | ORAL_CAPSULE | Freq: Every day | ORAL | Status: DC
  Administered 2016-11-08 – 2016-11-11 (×4): 300 mg via ORAL
  Filled 2016-11-08 (×4): qty 1

## 2016-11-08 MED ORDER — ACETAMINOPHEN 10 MG/ML IV SOLN
INTRAVENOUS | Status: AC
  Filled 2016-11-08: qty 100

## 2016-11-08 MED ORDER — SODIUM CHLORIDE 0.9 % IV SOLN
INTRAVENOUS | Status: DC
  Administered 2016-11-08 – 2016-11-09 (×2): via INTRAVENOUS

## 2016-11-08 MED ORDER — DEXAMETHASONE SODIUM PHOSPHATE 10 MG/ML IJ SOLN
10.0000 mg | Freq: Once | INTRAMUSCULAR | Status: AC
  Administered 2016-11-08: 10 mg via INTRAVENOUS

## 2016-11-08 MED ORDER — SODIUM CHLORIDE 0.9 % IJ SOLN
INTRAMUSCULAR | Status: DC | PRN
  Administered 2016-11-08 (×2): 30 mL

## 2016-11-08 MED ORDER — FLUTICASONE PROPIONATE 50 MCG/ACT NA SUSP
1.0000 | Freq: Every day | NASAL | Status: DC | PRN
  Filled 2016-11-08: qty 16

## 2016-11-08 MED ORDER — BUPIVACAINE LIPOSOME 1.3 % IJ SUSP
INTRAMUSCULAR | Status: DC | PRN
  Administered 2016-11-08 (×2): 10 mL

## 2016-11-08 MED ORDER — PHENYLEPHRINE HCL 10 MG/ML IJ SOLN
INTRAVENOUS | Status: DC | PRN
  Administered 2016-11-08: 10 ug/min via INTRAVENOUS

## 2016-11-08 MED ORDER — MIDAZOLAM HCL 2 MG/2ML IJ SOLN
INTRAMUSCULAR | Status: AC
  Filled 2016-11-08: qty 2

## 2016-11-08 MED ORDER — DEXAMETHASONE SODIUM PHOSPHATE 10 MG/ML IJ SOLN
10.0000 mg | Freq: Once | INTRAMUSCULAR | Status: AC
Start: 2016-11-09 — End: 2016-11-09
  Administered 2016-11-09: 08:00:00 10 mg via INTRAVENOUS
  Filled 2016-11-08: qty 1

## 2016-11-08 MED ORDER — MIDAZOLAM HCL 2 MG/2ML IJ SOLN: 2.0000 mg | Freq: Once | INTRAMUSCULAR | Status: DC

## 2016-11-08 MED ORDER — LACTATED RINGERS IV SOLN
INTRAVENOUS | Status: DC
  Administered 2016-11-08: 1000 mL via INTRAVENOUS
  Administered 2016-11-08: 10:00:00 via INTRAVENOUS

## 2016-11-08 MED ORDER — CHLORHEXIDINE GLUCONATE 4 % EX LIQD: 60.0000 mL | Freq: Once | CUTANEOUS | Status: DC

## 2016-11-08 MED ORDER — PHENOL 1.4 % MT LIQD
1.0000 | OROMUCOSAL | Status: DC | PRN
  Filled 2016-11-08: qty 177

## 2016-11-08 MED ORDER — MEGESTROL ACETATE 40 MG PO TABS
40.0000 mg | ORAL_TABLET | Freq: Every day | ORAL | Status: DC
  Administered 2016-11-09 – 2016-11-12 (×4): 40 mg via ORAL
  Filled 2016-11-08 (×4): qty 1

## 2016-11-08 MED ORDER — MENTHOL 3 MG MT LOZG: 1.0000 | LOZENGE | OROMUCOSAL | Status: DC | PRN

## 2016-11-08 MED ORDER — DEXAMETHASONE SODIUM PHOSPHATE 10 MG/ML IJ SOLN
INTRAMUSCULAR | Status: AC
  Filled 2016-11-08: qty 1

## 2016-11-08 MED ORDER — ACETAMINOPHEN 500 MG PO TABS
1000.0000 mg | ORAL_TABLET | Freq: Four times a day (QID) | ORAL | Status: AC
  Administered 2016-11-08 – 2016-11-09 (×4): 1000 mg via ORAL
  Filled 2016-11-08 (×4): qty 2

## 2016-11-08 MED ORDER — ONDANSETRON HCL 4 MG PO TABS: 4.0000 mg | ORAL_TABLET | Freq: Four times a day (QID) | ORAL | Status: DC | PRN

## 2016-11-08 MED ORDER — ROSUVASTATIN CALCIUM 5 MG PO TABS
5.0000 mg | ORAL_TABLET | Freq: Every day | ORAL | Status: DC
  Administered 2016-11-08 – 2016-11-11 (×4): 5 mg via ORAL
  Filled 2016-11-08 (×5): qty 1

## 2016-11-08 MED ORDER — FLUTICASONE FUROATE-VILANTEROL 100-25 MCG/INH IN AEPB
1.0000 | INHALATION_SPRAY | Freq: Every day | RESPIRATORY_TRACT | Status: DC
  Administered 2016-11-08 – 2016-11-11 (×4): 1 via RESPIRATORY_TRACT
  Filled 2016-11-08: qty 28

## 2016-11-08 MED ORDER — FENTANYL CITRATE (PF) 100 MCG/2ML IJ SOLN
100.0000 ug | Freq: Once | INTRAMUSCULAR | Status: AC
  Administered 2016-11-08: 50 ug via INTRAVENOUS

## 2016-11-08 MED ORDER — FENTANYL CITRATE (PF) 100 MCG/2ML IJ SOLN
INTRAMUSCULAR | Status: AC
  Filled 2016-11-08: qty 2

## 2016-11-08 MED ORDER — PROPRANOLOL HCL 20 MG PO TABS
20.0000 mg | ORAL_TABLET | Freq: Every day | ORAL | Status: DC
  Administered 2016-11-08 – 2016-11-12 (×5): 20 mg via ORAL
  Filled 2016-11-08 (×5): qty 1

## 2016-11-08 MED ORDER — CEFAZOLIN SODIUM-DEXTROSE 2-4 GM/100ML-% IV SOLN
INTRAVENOUS | Status: AC
  Filled 2016-11-08: qty 100

## 2016-11-08 MED ORDER — FENTANYL CITRATE (PF) 100 MCG/2ML IJ SOLN: 25.0000 ug | INTRAMUSCULAR | Status: DC | PRN

## 2016-11-08 MED ORDER — DARIFENACIN HYDROBROMIDE ER 7.5 MG PO TB24
7.5000 mg | ORAL_TABLET | Freq: Every evening | ORAL | Status: DC
  Administered 2016-11-08 – 2016-11-11 (×4): 7.5 mg via ORAL
  Filled 2016-11-08 (×4): qty 1

## 2016-11-08 MED ORDER — DIPHENHYDRAMINE HCL 12.5 MG/5ML PO ELIX: 12.5000 mg | ORAL_SOLUTION | ORAL | Status: DC | PRN

## 2016-11-08 MED ORDER — SODIUM CHLORIDE 0.9 % IJ SOLN
INTRAMUSCULAR | Status: AC
  Filled 2016-11-08: qty 50

## 2016-11-08 MED ORDER — BUPIVACAINE IN DEXTROSE 0.75-8.25 % IT SOLN
INTRATHECAL | Status: DC | PRN
  Administered 2016-11-08: 2 mL via INTRATHECAL

## 2016-11-08 MED ORDER — PHENYLEPHRINE HCL 10 MG/ML IJ SOLN
INTRAMUSCULAR | Status: AC
  Filled 2016-11-08: qty 1

## 2016-11-08 MED ORDER — CEFAZOLIN SODIUM-DEXTROSE 2-4 GM/100ML-% IV SOLN
2.0000 g | INTRAVENOUS | Status: AC
  Administered 2016-11-08: 2 g via INTRAVENOUS

## 2016-11-08 MED ORDER — RIVAROXABAN 10 MG PO TABS
10.0000 mg | ORAL_TABLET | Freq: Every day | ORAL | Status: DC
  Administered 2016-11-09 – 2016-11-12 (×4): 10 mg via ORAL
  Filled 2016-11-08 (×4): qty 1

## 2016-11-08 MED ORDER — 0.9 % SODIUM CHLORIDE (POUR BTL) OPTIME
TOPICAL | Status: DC | PRN
  Administered 2016-11-08: 1000 mL

## 2016-11-08 MED ORDER — OXYCODONE HCL 5 MG PO TABS
5.0000 mg | ORAL_TABLET | ORAL | Status: DC | PRN
  Administered 2016-11-08 – 2016-11-11 (×7): 5 mg via ORAL
  Filled 2016-11-08 (×7): qty 1

## 2016-11-08 MED ORDER — METOCLOPRAMIDE HCL 5 MG/ML IJ SOLN: 5.0000 mg | Freq: Three times a day (TID) | INTRAMUSCULAR | Status: DC | PRN

## 2016-11-08 MED ORDER — ARTIFICIAL TEARS OPHTHALMIC OINT
TOPICAL_OINTMENT | Freq: Every day | OPHTHALMIC | Status: DC
  Administered 2016-11-09 – 2016-11-12 (×2): via OPHTHALMIC
  Filled 2016-11-08: qty 3.5

## 2016-11-08 MED ORDER — PROPOFOL 500 MG/50ML IV EMUL
INTRAVENOUS | Status: DC | PRN
  Administered 2016-11-08: 50 ug/kg/min via INTRAVENOUS

## 2016-11-08 MED ORDER — LORATADINE 10 MG PO TABS
10.0000 mg | ORAL_TABLET | Freq: Every day | ORAL | Status: DC
  Administered 2016-11-09 – 2016-11-12 (×4): 10 mg via ORAL
  Filled 2016-11-08 (×4): qty 1

## 2016-11-08 MED ORDER — SODIUM CHLORIDE 0.9 % IJ SOLN
INTRAMUSCULAR | Status: AC
  Filled 2016-11-08: qty 10

## 2016-11-08 MED ORDER — MONTELUKAST SODIUM 10 MG PO TABS
10.0000 mg | ORAL_TABLET | Freq: Every day | ORAL | Status: DC
  Administered 2016-11-08 – 2016-11-11 (×4): 10 mg via ORAL
  Filled 2016-11-08 (×4): qty 1

## 2016-11-08 MED ORDER — ONDANSETRON HCL 4 MG/2ML IJ SOLN
INTRAMUSCULAR | Status: AC
  Filled 2016-11-08: qty 2

## 2016-11-08 MED ORDER — CEFAZOLIN SODIUM-DEXTROSE 2-4 GM/100ML-% IV SOLN
2.0000 g | Freq: Four times a day (QID) | INTRAVENOUS | Status: AC
  Administered 2016-11-08 (×2): 2 g via INTRAVENOUS
  Filled 2016-11-08 (×2): qty 100

## 2016-11-08 MED ORDER — ONDANSETRON HCL 4 MG/2ML IJ SOLN: 4.0000 mg | Freq: Four times a day (QID) | INTRAMUSCULAR | Status: DC | PRN

## 2016-11-08 MED ORDER — LEVOTHYROXINE SODIUM 112 MCG PO TABS
112.0000 ug | ORAL_TABLET | Freq: Every day | ORAL | Status: DC
  Administered 2016-11-09 – 2016-11-12 (×4): 112 ug via ORAL
  Filled 2016-11-08 (×4): qty 1

## 2016-11-08 MED ORDER — TRANEXAMIC ACID 1000 MG/10ML IV SOLN
1000.0000 mg | INTRAVENOUS | Status: AC
  Administered 2016-11-08: 1000 mg via INTRAVENOUS
  Filled 2016-11-08: qty 1100

## 2016-11-08 MED ORDER — ACETAMINOPHEN 650 MG RE SUPP: 650.0000 mg | Freq: Four times a day (QID) | RECTAL | Status: DC | PRN

## 2016-11-08 MED ORDER — STERILE WATER FOR IRRIGATION IR SOLN
Status: DC | PRN
  Administered 2016-11-08: 2000 mL

## 2016-11-08 MED ORDER — METHOCARBAMOL 1000 MG/10ML IJ SOLN
500.0000 mg | Freq: Four times a day (QID) | INTRAVENOUS | Status: DC | PRN
  Filled 2016-11-08: qty 5

## 2016-11-08 MED ORDER — FLEET ENEMA 7-19 GM/118ML RE ENEM: 1.0000 | ENEMA | Freq: Once | RECTAL | Status: DC | PRN

## 2016-11-08 MED ORDER — POLYETHYL GLYCOL-PROPYL GLYCOL 0.4-0.3 % OP GEL: Freq: Every day | OPHTHALMIC | Status: DC

## 2016-11-08 MED ORDER — SODIUM CHLORIDE 0.9 % IR SOLN
Status: DC | PRN
  Administered 2016-11-08: 1000 mL

## 2016-11-08 MED ORDER — BISACODYL 10 MG RE SUPP: 10.0000 mg | Freq: Every day | RECTAL | Status: DC | PRN

## 2016-11-08 MED ORDER — ONDANSETRON HCL 4 MG/2ML IJ SOLN
INTRAMUSCULAR | Status: DC | PRN
  Administered 2016-11-08: 4 mg via INTRAVENOUS

## 2016-11-08 MED ORDER — PANTOPRAZOLE SODIUM 40 MG PO TBEC
40.0000 mg | DELAYED_RELEASE_TABLET | Freq: Two times a day (BID) | ORAL | Status: DC
  Administered 2016-11-08 – 2016-11-09 (×2): 40 mg via ORAL
  Filled 2016-11-08 (×2): qty 1

## 2016-11-08 MED ORDER — FENTANYL CITRATE (PF) 100 MCG/2ML IJ SOLN
INTRAMUSCULAR | Status: AC
  Administered 2016-11-08: 50 ug via INTRAVENOUS
  Filled 2016-11-08: qty 2

## 2016-11-08 MED ORDER — MIRABEGRON ER 25 MG PO TB24
50.0000 mg | ORAL_TABLET | Freq: Every day | ORAL | Status: DC
  Administered 2016-11-09 – 2016-11-12 (×4): 50 mg via ORAL
  Filled 2016-11-08 (×4): qty 2

## 2016-11-08 MED ORDER — DOCUSATE SODIUM 100 MG PO CAPS
100.0000 mg | ORAL_CAPSULE | Freq: Two times a day (BID) | ORAL | Status: DC
  Administered 2016-11-08 – 2016-11-12 (×7): 100 mg via ORAL
  Filled 2016-11-08 (×8): qty 1

## 2016-11-08 MED ORDER — ACETAMINOPHEN 10 MG/ML IV SOLN
1000.0000 mg | Freq: Once | INTRAVENOUS | Status: AC
  Administered 2016-11-08: 1000 mg via INTRAVENOUS

## 2016-11-08 MED ORDER — METHOCARBAMOL 500 MG PO TABS
500.0000 mg | ORAL_TABLET | Freq: Four times a day (QID) | ORAL | Status: DC | PRN
  Administered 2016-11-08 – 2016-11-10 (×2): 500 mg via ORAL
  Filled 2016-11-08 (×2): qty 1

## 2016-11-08 MED ORDER — ALBUTEROL SULFATE (2.5 MG/3ML) 0.083% IN NEBU: 2.5000 mg | INHALATION_SOLUTION | RESPIRATORY_TRACT | Status: DC | PRN

## 2016-11-08 MED ORDER — TRANEXAMIC ACID 1000 MG/10ML IV SOLN
1000.0000 mg | Freq: Once | INTRAVENOUS | Status: AC
  Administered 2016-11-08: 1000 mg via INTRAVENOUS
  Filled 2016-11-08: qty 1100

## 2016-11-08 SURGICAL SUPPLY — 49 items
BAG ZIPLOCK 12X15 (MISCELLANEOUS) ×2 IMPLANT
BANDAGE ACE 6X5 VEL STRL LF (GAUZE/BANDAGES/DRESSINGS) ×2 IMPLANT
BLADE SAG 18X100X1.27 (BLADE) ×2 IMPLANT
BLADE SAW SGTL 11.0X1.19X90.0M (BLADE) ×2 IMPLANT
BOWL SMART MIX CTS (DISPOSABLE) ×2 IMPLANT
CAP KNEE TOTAL 3 SIGMA ×2 IMPLANT
CEMENT HV SMART SET (Cement) ×4 IMPLANT
COVER SURGICAL LIGHT HANDLE (MISCELLANEOUS) ×2 IMPLANT
CUFF TOURN SGL QUICK 34 (TOURNIQUET CUFF) ×1
CUFF TRNQT CYL 34X4X40X1 (TOURNIQUET CUFF) ×1 IMPLANT
DECANTER SPIKE VIAL GLASS SM (MISCELLANEOUS) ×2 IMPLANT
DRAPE U-SHAPE 47X51 STRL (DRAPES) ×2 IMPLANT
DRSG ADAPTIC 3X8 NADH LF (GAUZE/BANDAGES/DRESSINGS) ×2 IMPLANT
DRSG PAD ABDOMINAL 8X10 ST (GAUZE/BANDAGES/DRESSINGS) ×2 IMPLANT
DURAPREP 26ML APPLICATOR (WOUND CARE) ×2 IMPLANT
ELECT REM PT RETURN 15FT ADLT (MISCELLANEOUS) ×2 IMPLANT
EVACUATOR 1/8 PVC DRAIN (DRAIN) ×2 IMPLANT
GAUZE SPONGE 4X4 12PLY STRL (GAUZE/BANDAGES/DRESSINGS) ×2 IMPLANT
GLOVE BIO SURGEON STRL SZ8 (GLOVE) ×2 IMPLANT
GLOVE BIOGEL PI IND STRL 6.5 (GLOVE) ×2 IMPLANT
GLOVE BIOGEL PI IND STRL 7.5 (GLOVE) ×3 IMPLANT
GLOVE BIOGEL PI IND STRL 8 (GLOVE) ×1 IMPLANT
GLOVE BIOGEL PI INDICATOR 6.5 (GLOVE) ×2
GLOVE BIOGEL PI INDICATOR 7.5 (GLOVE) ×3
GLOVE BIOGEL PI INDICATOR 8 (GLOVE) ×1
GLOVE ECLIPSE 6.5 STRL STRAW (GLOVE) ×2 IMPLANT
GLOVE SURG SS PI 6.5 STRL IVOR (GLOVE) ×2 IMPLANT
GLOVE SURG SS PI 7.5 STRL IVOR (GLOVE) ×2 IMPLANT
GOWN STRL REUS W/ TWL XL LVL3 (GOWN DISPOSABLE) ×1 IMPLANT
GOWN STRL REUS W/TWL LRG LVL3 (GOWN DISPOSABLE) ×2 IMPLANT
GOWN STRL REUS W/TWL XL LVL3 (GOWN DISPOSABLE) ×5 IMPLANT
HANDPIECE INTERPULSE COAX TIP (DISPOSABLE) ×1
IMMOBILIZER KNEE 20 (SOFTGOODS) ×2
IMMOBILIZER KNEE 20 THIGH 36 (SOFTGOODS) ×1 IMPLANT
MANIFOLD NEPTUNE II (INSTRUMENTS) ×2 IMPLANT
PACK TOTAL KNEE CUSTOM (KITS) ×2 IMPLANT
PADDING CAST COTTON 6X4 STRL (CAST SUPPLIES) ×6 IMPLANT
POSITIONER SURGICAL ARM (MISCELLANEOUS) ×2 IMPLANT
SET HNDPC FAN SPRY TIP SCT (DISPOSABLE) ×1 IMPLANT
STRIP CLOSURE SKIN 1/2X4 (GAUZE/BANDAGES/DRESSINGS) ×4 IMPLANT
SUT MNCRL AB 4-0 PS2 18 (SUTURE) ×2 IMPLANT
SUT STRATAFIX 0 PDS 27 VIOLET (SUTURE) ×2
SUT VIC AB 2-0 CT1 27 (SUTURE) ×3
SUT VIC AB 2-0 CT1 TAPERPNT 27 (SUTURE) ×3 IMPLANT
SUTURE STRATFX 0 PDS 27 VIOLET (SUTURE) ×1 IMPLANT
SYR 30ML LL (SYRINGE) ×4 IMPLANT
TRAY FOLEY W/METER SILVER 16FR (SET/KITS/TRAYS/PACK) ×2 IMPLANT
WRAP KNEE MAXI GEL POST OP (GAUZE/BANDAGES/DRESSINGS) ×2 IMPLANT
YANKAUER SUCT BULB TIP 10FT TU (MISCELLANEOUS) ×2 IMPLANT

## 2016-11-08 NOTE — Op Note (Signed)
OPERATIVE REPORT-TOTAL KNEE ARTHROPLASTY   Pre-operative diagnosis- Osteoarthritis  Left knee(s)  Post-operative diagnosis- Osteoarthritis Left knee(s)  Procedure-  Left  Total Knee Arthroplasty  Surgeon- Randy Sullivan. Randy Waage, MD  Assistant- Randy Jourdain, PA-C   Anesthesia-  Adductor canal block and spinal  EBL-* No blood loss amount entered *   Drains Hemovac  Tourniquet time-  Total Tourniquet Time Documented: Thigh (Left) - 40 minutes Total: Thigh (Left) - 40 minutes     Complications- None  Condition-PACU - hemodynamically stable.   Brief Clinical Note   Randy Sullivan is a 83 y.o. year old male with end stage OA of his left knee with progressively worsening pain and dysfunction. He has constant pain, with activity and at rest and significant functional deficits with difficulties even with ADLs. He has had extensive non-op management including analgesics, injections of cortisone and viscosupplements, and home exercise program, but remains in significant pain with significant dysfunction. Radiographs show bone on bone arthritis medial and patellofemoral. He presents now for left Total Knee Arthroplasty.     Procedure in detail---   The patient is brought into the operating room and positioned supine on the operating table. After successful administration of  Adductor canal block and spinal,   a tourniquet is placed high on the  Left thigh(s) and the lower extremity is prepped and draped in the usual sterile fashion. Time out is performed by the operating team and then the  Left lower extremity is wrapped in Esmarch, knee flexed and the tourniquet inflated to 300 mmHg.       A midline incision is made with a ten blade through the subcutaneous tissue to the level of the extensor mechanism. A fresh blade is used to make a medial parapatellar arthrotomy. Soft tissue over the proximal medial tibia is subperiosteally elevated to the joint line with a knife and into the  semimembranosus bursa with a Cobb elevator. Soft tissue over the proximal lateral tibia is elevated with attention being paid to avoiding the patellar tendon on the tibial tubercle. The patella is everted, knee flexed 90 degrees and the ACL and PCL are removed. Findings are bone on bone medial and patellofemoral with massive global osteophytes.        The drill is used to create a starting hole in the distal femur and the canal is thoroughly irrigated with sterile saline to remove the fatty contents. The 5 degree Left  valgus alignment guide is placed into the femoral canal and the distal femoral cutting block is pinned to remove 10 mm off the distal femur. Resection is made with an oscillating saw.      The tibia is subluxed forward and the menisci are removed. The extramedullary alignment guide is placed referencing proximally at the medial aspect of the tibial tubercle and distally along the second metatarsal axis and tibial crest. The block is pinned to remove 52mm off the more deficient medial  side. Resection is made with an oscillating saw. Size 5is the most appropriate size for the tibia and the proximal tibia is prepared with the modular drill and keel punch for that size.      The femoral sizing guide is placed and size 5 is most appropriate. Rotation is marked off the epicondylar axis and confirmed by creating a rectangular flexion gap at 90 degrees. The size 5 cutting block is pinned in this rotation and the anterior, posterior and chamfer cuts are made with the oscillating saw. The intercondylar block is then  placed and that cut is made.      Trial size 5 tibial component, trial size 5 posterior stabilized femur and a 12.5  mm posterior stabilized rotating platform insert trial is placed. Full extension is achieved with excellent varus/valgus and anterior/posterior balance throughout full range of motion. The patella is everted and thickness measured to be 27  mm. Free hand resection is taken to 15  mm, a 41 template is placed, lug holes are drilled, trial patella is placed, and it tracks normally. Osteophytes are removed off the posterior femur with the trial in place. All trials are removed and the cut bone surfaces prepared with pulsatile lavage. Cement is mixed and once ready for implantation, the size 5 tibial implant, size  5 posterior stabilized femoral component, and the size 41 patella are cemented in place and the patella is held with the clamp. The trial insert is placed and the knee held in full extension. The Exparel (20 ml mixed with 60 ml saline) is injected into the extensor mechanism, posterior capsule, medial and lateral gutters and subcutaneous tissues.  All extruded cement is removed and once the cement is hard the permanent 12.5 mm posterior stabilized rotating platform insert is placed into the tibial tray.      The wound is copiously irrigated with saline solution and the extensor mechanism closed over a hemovac drain with #1 V-loc suture. The tourniquet is released for a total tourniquet time of 40  minutes. Flexion against gravity is 135 degrees and the patella tracks normally. Subcutaneous tissue is closed with 2.0 vicryl and subcuticular with running 4.0 Monocryl. The incision is cleaned and dried and steri-strips and a bulky sterile dressing are applied. The limb is placed into a knee immobilizer and the patient is awakened and transported to recovery in stable condition.      Please note that a surgical assistant was a medical necessity for this procedure in order to perform it in a safe and expeditious manner. Surgical assistant was necessary to retract the ligaments and vital neurovascular structures to prevent injury to them and also necessary for proper positioning of the limb to allow for anatomic placement of the prosthesis.   Randy Sullivan Randy Gullo, MD    11/08/2016, 10:31 AM

## 2016-11-08 NOTE — Interval H&P Note (Signed)
History and Physical Interval Note:  11/08/2016 6:43 AM  Randy Sullivan  has presented today for surgery, with the diagnosis of Osteoarthritis Left Knee  The various methods of treatment have been discussed with the patient and family. After consideration of risks, benefits and other options for treatment, the patient has consented to  Procedure(s): LEFT TOTAL KNEE ARTHROPLASTY (Left) as a surgical intervention .  The patient's history has been reviewed, patient examined, no change in status, stable for surgery.  I have reviewed the patient's chart and labs.  Questions were answered to the patient's satisfaction.     [name]

## 2016-11-08 NOTE — Progress Notes (Signed)
AssistedDr. Orene Desanctis with left, ultrasound guided, adductor canal block. Side rails up, monitors on throughout procedure. See vital signs in flow sheet. Tolerated Procedure well.

## 2016-11-08 NOTE — H&P (View-Only) (Signed)
Randy Sullivan DOB: 01/18/1929 Married / Language: [name] / Race: White Male Date of admission: November 08, 2016 Chief complaint: left knee pain History of Present Illness  The patient is a 83 year old male who comes in for a preoperative History and Physical. The patient is scheduled for a left total knee arthroplasty to be performed by Dr. Dione Plover. Aluisio, MD at [facility] on 11/08/2016. The patient is a 83 year old male who presented for follow up of their knee. The patient is being followed for their bilateral knee pain and osteoarthritis. They are now months out from aspiration and injections in both knees (that did not provide much relief). Symptoms reported tod include: pain and swelling (especially in the left knee). The following medication has been used for pain control: none. Note: his LEFT knee is far more symptomatic than the RIGHT. Is a recurrent pain and swelling. Cortisone and Visco supplements have not been of benefit. He feels as though the knee is essentially taken over what he can and cannot do. He has had a stage was ready to go ahead and get the knee fixed. They have been treated conservatively in the past for the above stated problem and despite conservative measures, they continue to have progressive pain and severe functional limitations and dysfunction. They have failed non-operative management including home exercise, medications, and injections. It is felt that they would benefit from undergoing total joint replacement. Risks and benefits of the procedure have been discussed with the patient and they elect to proceed with surgery. There are no active contraindications to surgery such as ongoing infection or rapidly progressive neurological disease.   Problem List/Past Medical  Sprain of deltoid ligament of left ankle, initial encounter (C14.481E)  Acute pain of right knee (M25.561)  Chronic pain of right ankle (M25.571)  Primary osteoarthritis of right  knee (M17.11)  Sprain of anterior talofibular ligament, right, initial encounter (H63.149F)  Wrist pain, acute, right (M25.531)  Hypothyroidism  Skin Cancer  Hypercholesterolemia  High blood pressure  Sleep Apnea  uses CPAP Impaired Hearing  bilateral hearing aids  Allergies  No Known Drug Allergies   Family History Cancer  mother Heart Disease  father Heart disease in male family member before age 58   Social History Drug/Alcohol Rehab (Currently)  no Drug/Alcohol Rehab (Previously)  no Current work status  retired Alcohol use  Drinks wine. current drinker Children  0 Illicit drug use  no Tobacco / smoke exposure  no Tobacco use  Former smoker. former smoker Pain Contract  no Living situation  live with spouse Marital status  married  Medication History  Loratadine (10MG  Tablet, Oral) Active. Systane (0.4-0.3% Gel, Ophthalmic) Active. Fluticasone Propionate (50MCG/ACT Suspension, Nasal as needed) Active. Gabapentin (100MG  Capsule, Oral) Active. Breo Ellipta (100-25MCG/INH Aero Pow Br Act, Inhalation) Active. Myrbetriq (25MG  Tablet ER 24HR, Oral) Active. Propranolol HCl (10MG  Tablet, Oral) Active. Vitamin B12 (100MCG Tablet, Oral) Active. Vitamin D (1000UNIT Tablet, Oral) Active. Aspirin (81MG  Tablet, 1 Oral) Active. Synthroid (100MCG Tablet, Oral) Active. Crestor (Oral) Specific strength unknown - Active.  Past Surgical History  Tonsillectomy  Appendectomy  Colon Polyp Removal - Colonoscopy  Hemorrhoidectomy     Review of Systems  General Not Present- Chills, Fatigue, Fever, Memory Loss, Night Sweats, Weight Gain and Weight Loss. Skin Not Present- Eczema, Hives, Itching, Lesions and Rash. HEENT Not Present- Dentures, Double Vision, Headache, Hearing Loss, Tinnitus and Visual Loss. Respiratory Not Present- Allergies, Chronic Cough, Coughing up blood, Shortness of breath  at rest and Shortness of breath with  exertion. Cardiovascular Not Present- Chest Pain, Difficulty Breathing Lying Down, Murmur, Palpitations, Racing/skipping heartbeats and Swelling. Gastrointestinal Not Present- Abdominal Pain, Bloody Stool, Constipation, Diarrhea, Difficulty Swallowing, Heartburn, Jaundice, Loss of appetitie, Nausea and Vomiting. Male Genitourinary Not Present- Blood in Urine, Discharge, Flank Pain, Incontinence, Painful Urination, Urgency, Urinary frequency, Urinary Retention, Urinating at Night and Weak urinary stream. Musculoskeletal Present- Joint Pain. Not Present- Back Pain, Joint Swelling, Morning Stiffness, Muscle Pain, Muscle Weakness and Spasms. Neurological Not Present- Blackout spells, Difficulty with balance, Dizziness, Paralysis, Tremor and Weakness. Psychiatric Not Present- Insomnia.  Vitals  Weight: 180 lb Height: 69.5in Weight was reported by patient. Height was reported by patient. Body Surface Area: 1.99 m Body Mass Index: 26.2 kg/m  Pulse: 84 (Regular)  Resp.: 16 (Unlabored)  BP: 134/68 (Sitting, Right Arm, Standard)       Physical Exam General Mental Status -Alert, cooperative and good historian. General Appearance-pleasant, Not in acute distress. Orientation-Oriented X3. Build & Nutrition-Well nourished and Well developed.  Head and Neck Head-normocephalic, atraumatic . Neck Global Assessment - supple, no bruit auscultated on the right, no bruit auscultated on the left.  Eye Vision-Wears corrective lenses(readers). Pupil - Bilateral-Regular and Round. Motion - Bilateral-EOMI.  ENMT Note: upper and lower partial denture plates   Chest and Lung Exam Auscultation Breath sounds - clear at anterior chest wall and clear at posterior chest wall. Adventitious sounds - No Adventitious sounds.  Cardiovascular Auscultation Rhythm - Regular rate and rhythm. Heart Sounds - S1 WNL and S2 WNL. Murmurs & Other Heart Sounds - Auscultation of the heart  reveals - No Murmurs.  Abdomen Palpation/Percussion Tenderness - Abdomen is non-tender to palpation. Rigidity (guarding) - Abdomen is soft. Auscultation Auscultation of the abdomen reveals - Bowel sounds normal.  Male Genitourinary Note: Not done, not pertinent to present illness   Musculoskeletal Note: Well-developed male alert and oriented in no apparent distress.Evaluation of the left hip shows flexion to 120 rotation in 30 out 40 and abduction 40 without discomfort. There is no tenderness over the greater trochanter. There is no pain on provocative testing of the hip.Examination of the right hip shows flexion to 120 rotation in 30 abduction 40 and external rotation of 40. There is no tenderness over the greater trochanter. There is no pain on provocative testing of the hip.Marland Kitchen His LEFT knee shows a slight effusion. Range of motion is 5-125. There is moderate crepitus on range of motion of the knee. He has tenderness medial greater than lateral. There is no instability. RIGHT knee no effusion range 0-125 moderate crepitus on range of motion tender medial greater lateral with no instability.   Assessment & Plan Primary osteoarthritis of one knee, left (M17.12)  Note:Surgical Plans: Left Total Knee Replacement  Disposition: Home, Straight to outpatient at [facility]  PCP: Dr. Dagmar Hait - pending Cards: Dr. Sallyanne Kuster - Patient has been seen preoperatively and felt to be stable for surgery. "...aspirin can be held 5 days prior to the procedure. Please ensure that propranolol is not interrupted during the perioperative period."  IV TXA  Anesthesia Issues: None  Patient was instructed on what medications to stop prior to surgery.  Signed electronically by Joelene Millin, III PA-C

## 2016-11-08 NOTE — Anesthesia Procedure Notes (Signed)
Spinal  Patient location during procedure: OR Start time: 11/08/2016 9:23 AM End time: 11/08/2016 9:28 AM Staffing Anesthesiologist: Rica Koyanagi Resident/CRNA: Jaima Janney G Performed: resident/CRNA  Preanesthetic Checklist Completed: patient identified, site marked, surgical consent, pre-op evaluation, timeout performed, IV checked, risks and benefits discussed and monitors and equipment checked Spinal Block Patient position: sitting Prep: ChloraPrep Patient monitoring: heart rate, continuous pulse ox and blood pressure Approach: right paramedian Location: L2-3 Injection technique: single-shot Needle Needle type: Quincke  Needle gauge: 25 G Needle length: 9 cm Needle insertion depth: 6 cm Assessment Sensory level: T6 Additional Notes Kit expiration date checked and verified.  - heme, - paraesthesia, + CSF pre and post injection, patient tolerated well.

## 2016-11-08 NOTE — Evaluation (Signed)
Physical Therapy Evaluation Patient Details Name: Randy Sullivan MRN: [id number] DOB: 1928-08-05 Today's Date: 11/08/2016   History of Present Illness  Left TKA, right knee has issues with pain, buckling,locking up.  Clinical Impression  The patient required extensive assistance for bed mobility and transfers. The Right knee(non-surgical) was painful when initiailly attempting to flex the knee.  The  Patient presents with decreased step length and balance  When ambulating. Pt admitted with above diagnosis. Pt currently with functional limitations due to the deficits listed below (see PT Problem List).  Pt will benefit from skilled PT to increase their independence and safety with mobility to allow discharge to the venue listed below.     Follow Up Recommendations SNF;Supervision/Assistance - 24 hour    Equipment Recommendations  None recommended by PT    Recommendations for Other Services       Precautions / Restrictions Precautions Precautions: Fall;Knee Required Braces or Orthoses: Knee Immobilizer - Left;Other Brace/Splint Knee Immobilizer - Left: Discontinue once straight leg raise with < 10 degree lag Other Brace/Splint: wears a brace on left but did  not      Mobility  Bed Mobility Overal bed mobility: Needs Assistance Bed Mobility: Supine to Sit;Sit to Supine     Supine to sit: Max assist;+2 for safety/equipment;HOB elevated;+2 for physical assistance Sit to supine: +2 for physical assistance;Max assist   General bed mobility comments: assist with legs and trunk to mobilize to sitting, patient sort of "stuck" and could not weight shift to move forward, required assist for bothe legs back into bed.  Transfers Overall transfer level: Needs assistance Equipment used: Rolling walker (2 wheeled) Transfers: Sit to/from Stand Sit to Stand: Mod assist;+2 physical assistance;+2 safety/equipment         General transfer comment: patient noted with tremors of legs and  trunk. Assistance to power up and steady tio standing position with bed raised. multimodal cues for hand ands and assist sliding left leg forward.   Ambulation/Gait Ambulation/Gait assistance: Mod assist;+2 physical assistance;+2 safety/equipment Ambulation Distance (Feet): 4 Feet Assistive device: Rolling walker (2 wheeled) Gait Pattern/deviations: Step-to pattern;Shuffle;Festinating     General Gait Details: Much support   of 2 for taking small steps forward then backward, tends to lean posteriorly.  Stairs            Wheelchair Mobility    Modified Rankin (Stroke Patients Only)       Balance Overall balance assessment: Needs assistance;History of Falls Sitting-balance support: Bilateral upper extremity supported;Feet supported Sitting balance-[name]: Fair     Standing balance support: Bilateral upper extremity supported;During functional activity Standing balance-[name]: Poor Standing balance comment: posterior lean                             Pertinent Vitals/Pain Pain Assessment: 0-10 Pain Score: 3  Pain Location: left knee, right knee , medially,7 during mobility Pain Descriptors / Indicators: Discomfort;Grimacing;Guarding;Sharp Pain Intervention(s): Monitored during session;Premedicated before session;Repositioned;Ice applied    Home Living Family/patient expects to be discharged to:: Skilled nursing facility Living Arrangements: Spouse/significant other Available Help at Discharge: Family Type of Home: House Home Access: Stairs to enter Entrance Stairs-Rails: Psychiatric nurse of Steps: 3 Home Layout: One level Home Equipment: Walker - 2 wheels      Prior Function Level of Independence: Independent with assistive device(s)               Hand Dominance  Extremity/Trunk Assessment   Upper Extremity Assessment Upper Extremity Assessment: Defer to OT evaluation (noted tremors)    Lower Extremity  Assessment Lower Extremity Assessment: RLE deficits/detail;LLE deficits/detail RLE Deficits / Details: decreased knee flexion  upon exiting the bed when knee was startinf=g to flex- c/o pain medially. gradually flexed LLE Deficits / Details: able to perform SLR    Cervical / Trunk Assessment Cervical / Trunk Assessment: Kyphotic  Communication   Communication: No difficulties  Cognition Arousal/Alertness: Awake/alert Behavior During Therapy: WFL for tasks assessed/performed Overall Cognitive Status: Within Functional Limits for tasks assessed                                        General Comments      Exercises     Assessment/Plan    PT Assessment Patient needs continued PT services  PT Problem List Decreased strength;Decreased range of motion;Decreased activity tolerance;Decreased balance;Decreased mobility;Decreased knowledge of precautions;Decreased safety awareness;Decreased knowledge of use of DME;Pain;Decreased coordination       PT Treatment Interventions DME instruction;Gait training;Functional mobility training;Therapeutic activities;Therapeutic exercise;Stair training;Patient/family education    PT Goals (Current goals can be found in the Care Plan section)  Acute Rehab PT Goals Patient Stated [place]al[place] PT Goal Formulation: With patient Time For Goal Achievement: 11/15/16 Potential to Achieve Goals: Good    Frequency 7X/week   Barriers to discharge        Co-evaluation               AM-PAC PT "6 Clicks" Daily Activity  Outcome Measure Difficulty turning over in bed (including adjusting bedclothes, sheets and blankets)?: Unable Difficulty moving from lying on back to sitting on the side of the bed? : Unable Difficulty sitting down on and standing up from a chair with arms (e.g., wheelchair, bedside commode, etc,.)?: Unable Help needed moving to and from a bed to chair (including a wheelchair)?: Total Help needed  walking in hospital room?: Total Help needed climbing 3-5 steps with a railing? : Total 6 Click Score: 6    End of Session Equipment Utilized During Treatment: Gait belt;Left knee immobilizer Activity Tolerance: Patient tolerated treatment well Patient left: in bed;with call bell/phone within reach;with family/visitor present Nurse Communication: Mobility status PT Visit Diagnosis: Unsteadiness on feet (R26.81);Difficulty in walking, not elsewhere classified (R26.2);Pain Pain - Right/Left: Right Pain - part of body: Knee    Time: 8127-5170 PT Time Calculation (min) (ACUTE ONLY): 31 min   Charges:   PT Evaluation $PT Eval Low Complexity: 1 Low PT Treatments $Gait Training: 8-22 mins   PT G CodesTresa Sullivan PT [phone]   Randy Sullivan 11/08/2016, 6:11 PM

## 2016-11-08 NOTE — Anesthesia Procedure Notes (Signed)
Anesthesia Regional Block: Adductor canal block   Pre-Anesthetic Checklist: ,, timeout performed, Correct Patient, Correct Site, Correct Laterality, Correct Procedure, Correct Position, site marked, Risks and benefits discussed,  Surgical consent,  Pre-op evaluation,  At surgeon's request and post-op pain management  Laterality: Left and Lower  Prep: chloraprep       Needles:   Needle Type: Echogenic Stimulator Needle     Needle Length: 9cm  Needle Gauge: 21   Needle insertion depth: 4 cm   Additional Needles:   Procedures:,,,, ultrasound used (permanent image in chart),,,,  Narrative:  Start time: 11/08/2016 8:40 AM End time: 11/08/2016 8:51 AM Injection made incrementally with aspirations every 5 mL.  Performed by: Personally  Anesthesiologist: Vashawn Ekstein

## 2016-11-08 NOTE — Discharge Instructions (Addendum)
° °Dr. Frank Aluisio °Total Joint Specialist °[facility] °[address] °[place],  [id number] °[phone] ° °TOTAL KNEE REPLACEMENT POSTOPERATIVE DIRECTIONS ° °Knee Rehabilitation, Guidelines Following Surgery  °Results after knee surgery are often greatly improved when you follow the exercise, range of motion and muscle strengthening exercises prescribed by your doctor. Safety measures are also important to protect the knee from further injury. Any time any of these exercises cause you to have increased pain or swelling in your knee joint, decrease the amount until you are comfortable again and slowly increase them. If you have problems or questions, call your caregiver or physical therapist for advice.  ° °HOME CARE INSTRUCTIONS  °Remove items at home which could result in a fall. This includes throw rugs or furniture in walking pathways.  °· ICE to the affected knee every three hours for 30 minutes at a time and then as needed for pain and swelling.  Continue to use ice on the knee for pain and swelling from surgery. You may notice swelling that will progress down to the foot and ankle.  This is normal after surgery.  Elevate the leg when you are not up walking on it.   °· Continue to use the breathing machine which will help keep your temperature down.  It is common for your temperature to cycle up and down following surgery, especially at night when you are not up moving around and exerting yourself.  The breathing machine keeps your lungs expanded and your temperature down. °· Do not place pillow under knee, focus on keeping the knee straight while resting ° °DIET °You may resume your previous home diet once your are discharged from the hospital. ° °DRESSING / WOUND CARE / SHOWERING °You may shower 3 days after surgery, but keep the wounds dry during showering.  You may use an occlusive plastic wrap (Press'n Seal for example), NO SOAKING/SUBMERGING IN THE BATHTUB.  If the  bandage gets wet, change with a clean dry gauze.  If the incision gets wet, pat the wound dry with a clean towel. °You may start showering once you are discharged home but do not submerge the incision under water. Just pat the incision dry and apply a dry gauze dressing on daily. °Change the surgical dressing daily and reapply a dry dressing each time. ° °ACTIVITY °Walk with your walker as instructed. °Use walker as long as suggested by your caregivers. °Avoid periods of inactivity such as sitting longer than an hour when not asleep. This helps prevent blood clots.  °You may resume a sexual relationship in one month or when given the OK by your doctor.  °You may return to work once you are cleared by your doctor.  °Do not drive a car for 6 weeks or until released by you surgeon.  °Do not drive while taking narcotics. ° °WEIGHT BEARING °Weight bearing as tolerated with assist device (walker, cane, etc) as directed, use it as long as suggested by your surgeon or therapist, typically at least 4-6 weeks. ° °POSTOPERATIVE CONSTIPATION PROTOCOL °Constipation - defined medically as fewer than three stools per week and severe constipation as less than one stool per week. ° °One of the most common issues patients have following surgery is constipation.  Even if you have a regular bowel pattern at home, your normal regimen is likely to be disrupted due to multiple reasons following surgery.  Combination of anesthesia, postoperative narcotics, change in appetite and fluid intake all can affect your bowels.    In order to avoid complications following surgery, here are some recommendations in order to help you during your recovery period. ° °Colace (docusate) - Pick up an over-the-counter form of Colace or another stool softener and take twice a day as long as you are requiring postoperative pain medications.  Take with a full glass of water daily.  If you experience loose stools or diarrhea, hold the colace until you stool forms  back up.  If your symptoms do not get better within 1 week or if they get worse, check with your doctor. ° °Dulcolax (bisacodyl) - Pick up over-the-counter and take as directed by the product packaging as needed to assist with the movement of your bowels.  Take with a full glass of water.  Use this product as needed if not relieved by Colace only.  ° °MiraLax (polyethylene glycol) - Pick up over-the-counter to have on hand.  MiraLax is a solution that will increase the amount of water in your bowels to assist with bowel movements.  Take as directed and can mix with a glass of water, juice, soda, coffee, or tea.  Take if you go more than two days without a movement. °Do not use MiraLax more than once per day. Call your doctor if you are still constipated or irregular after using this medication for 7 days in a row. ° °If you continue to have problems with postoperative constipation, please contact the office for further assistance and recommendations.  If you experience "the worst abdominal pain ever" or develop nausea or vomiting, please contact the office immediatly for further recommendations for treatment. ° °ITCHING ° If you experience itching with your medications, try taking only a single pain pill, or even half a pain pill at a time.  You can also use Benadryl over the counter for itching or also to help with sleep.  ° °TED HOSE STOCKINGS °Wear the elastic stockings on both legs for three weeks following surgery during the day but you may remove then at night for sleeping. ° °MEDICATIONS °See your medication summary on the “After Visit Summary” that the nursing staff will review with you prior to discharge.  You may have some home medications which will be placed on hold until you complete the course of blood thinner medication.  It is important for you to complete the blood thinner medication as prescribed by your surgeon.  Continue your approved medications as instructed at time of  discharge. ° °PRECAUTIONS °If you experience chest pain or shortness of breath - call 911 immediately for transfer to the hospital emergency department.  °If you develop a fever greater that 101 F, purulent drainage from wound, increased redness or drainage from wound, foul odor from the wound/dressing, or calf pain - CONTACT YOUR SURGEON.   °                                                °FOLLOW-UP APPOINTMENTS °Make sure you keep all of your appointments after your operation with your surgeon and caregivers. You should call the office at the above phone number and make an appointment for approximately two weeks after the date of your surgery or on the date instructed by your surgeon outlined in the "After Visit Summary". ° ° °RANGE OF MOTION AND STRENGTHENING EXERCISES  °Rehabilitation of the knee is important following a knee injury or   an operation. After just a few days of immobilization, the muscles of the thigh which control the knee become weakened and shrink (atrophy). Knee exercises are designed to build up the tone and strength of the thigh muscles and to improve knee motion. Often times heat used for twenty to thirty minutes before working out will loosen up your tissues and help with improving the range of motion but do not use heat for the first two weeks following surgery. These exercises can be done on a training (exercise) mat, on the floor, on a table or on a bed. Use what ever works the best and is most comfortable for you Knee exercises include:  °Leg Lifts - While your knee is still immobilized in a splint or cast, you can do straight leg raises. Lift the leg to 60 degrees, hold for 3 sec, and slowly lower the leg. Repeat 10-20 times 2-3 times daily. Perform this exercise against resistance later as your knee gets better.  °Quad and Hamstring Sets - Tighten up the muscle on the front of the thigh (Quad) and hold for 5-10 sec. Repeat this 10-20 times hourly. Hamstring sets are done by pushing the  foot backward against an object and holding for 5-10 sec. Repeat as with quad sets.  °· Leg Slides: Lying on your back, slowly slide your foot toward your buttocks, bending your knee up off the floor (only go as far as is comfortable). Then slowly slide your foot back down until your leg is flat on the floor again. °· Angel Wings: Lying on your back spread your legs to the side as far apart as you can without causing discomfort.  °A rehabilitation program following serious knee injuries can speed recovery and prevent re-injury in the future due to weakened muscles. Contact your doctor or a physical therapist for more information on knee rehabilitation.  ° °IF YOU ARE TRANSFERRED TO A SKILLED REHAB FACILITY °If the patient is transferred to a skilled rehab facility following release from the hospital, a list of the current medications will be sent to the facility for the patient to continue.  When discharged from the skilled rehab facility, please have the facility set up the patient's Home Health Physical Therapy prior to being released. Also, the skilled facility will be responsible for providing the patient with their medications at time of release from the facility to include their pain medication, the muscle relaxants, and their blood thinner medication. If the patient is still at the rehab facility at time of the two week follow up appointment, the skilled rehab facility will also need to assist the patient in arranging follow up appointment in our office and any transportation needs. ° °MAKE SURE YOU:  °Understand these instructions.  °Get help right away if you are not doing well or get worse.  ° ° °Pick up stool softner and laxative for home use following surgery while on pain medications. °Do not submerge incision under water. °Please use good hand washing techniques while changing dressing each day. °May shower starting three days after surgery. °Please use a clean towel to pat the incision dry following  showers. °Continue to use ice for pain and swelling after surgery. °Do not use any lotions or creams on the incision until instructed by your surgeon. ° °Take Xarelto for two and a half more weeks following discharge from the hospital, then discontinue Xarelto. °Once the patient has completed the Xarelto, they may resume the 81 mg Aspirin. ° ° ° °Information on   my medicine - XARELTO® (Rivaroxaban) ° °Why was Xarelto® prescribed for you? °Xarelto® was prescribed for you to reduce the risk of blood clots forming after orthopedic surgery. The medical term for these abnormal blood clots is venous thromboembolism (VTE). ° °What do you need to know about xarelto® ? °Take your Xarelto® ONCE DAILY at the same time every day. °You may take it either with or without food. ° °If you have difficulty swallowing the tablet whole, you may crush it and mix in applesauce just prior to taking your dose. ° °Take Xarelto® exactly as prescribed by your doctor and DO NOT stop taking Xarelto® without talking to the doctor who prescribed the medication.  Stopping without other VTE prevention medication to take the place of Xarelto® may increase your risk of developing a clot. ° °After discharge, you should have regular check-up appointments with your healthcare provider that is prescribing your Xarelto®.   ° °What do you do if you miss a dose? °If you miss a dose, take it as soon as you remember on the same day then continue your regularly scheduled once daily regimen the next day. Do not take two doses of Xarelto® on the same day.  ° °Important Safety Information °A possible side effect of Xarelto® is bleeding. You should call your healthcare provider right away if you experience any of the following: °? Bleeding from an injury or your nose that does not stop. °? Unusual colored urine (red or dark brown) or unusual colored stools (red or black). °? Unusual bruising for unknown reasons. °? A serious fall or if you hit your head (even if  there is no bleeding). ° °Some medicines may interact with Xarelto® and might increase your risk of bleeding while on Xarelto®. To help avoid this, consult your healthcare provider or pharmacist prior to using any new prescription or non-prescription medications, including herbals, vitamins, non-steroidal anti-inflammatory drugs (NSAIDs) and supplements. ° °This website has more information on Xarelto®: www.xarelto.com. ° ° ° °

## 2016-11-08 NOTE — Anesthesia Procedure Notes (Signed)
Date/Time: 11/08/2016 9:16 AM Performed by: Glory Buff Oxygen Delivery Method: Simple face mask

## 2016-11-08 NOTE — Anesthesia Postprocedure Evaluation (Signed)
Anesthesia Post Note  Patient: Randy Sullivan  Procedure(s) Performed: Procedure(s) (LRB): LEFT TOTAL KNEE ARTHROPLASTY (Left)     Patient location during evaluation: PACU Anesthesia Type: Spinal Level of consciousness: oriented and awake and alert Pain management: pain level controlled Vital Signs Assessment: post-procedure vital signs reviewed and stable Respiratory status: spontaneous breathing, respiratory function stable and patient connected to nasal cannula oxygen Cardiovascular status: blood pressure returned to baseline and stable Postop Assessment: no headache, no backache and no apparent nausea or vomiting Anesthetic complications: no    Last Vitals:  Vitals:   11/08/16 1227 11/08/16 1240  BP: (!) 155/87 (!) 167/92  Pulse: 85 89  Resp: 12 14  Temp: (!) 36.4 C 36.6 C  SpO2: 100% 100%    Last Pain:  Vitals:   11/08/16 0719  TempSrc: Oral                 Kenyatta Gloeckner,Connor TERRILL

## 2016-11-08 NOTE — Anesthesia Preprocedure Evaluation (Addendum)
Anesthesia Evaluation  Patient identified by MRN, date of birth, ID band Patient awake    Reviewed: Allergy & Precautions, NPO status , Patient's Chart, lab work & pertinent test results  Airway Mallampati: II       Dental no notable dental hx.    Pulmonary shortness of breath, sleep apnea , COPD, former smoker,    breath sounds clear to auscultation       Cardiovascular hypertension, + Peripheral Vascular Disease   Rhythm:Regular Rate:Normal   - Left ventricle: Wall thickness was increased in a pattern of   moderate LVH. Systolic function was normal. The estimated   ejection fraction was in the range of 55% to 60%. Wall motion was   normal; there were no regional wall motion abnormalities. Doppler   parameters are consistent with abnormal left ventricular   relaxation (grade 1 diastolic dysfunction). - Aortic valve: Trileaflet; moderate calcification towards the   leaflet tips with some nodularity. There was no stenosis. There   was no regurgitation. - Aorta: Mildly dilated aortic root. Aortic root dimension: 39 mm   (ED). - Mitral valve: There was trivial regurgitation. - Right ventricle: The cavity size was normal. Systolic function   was normal. - Pulmonary arteries: No complete TR doppler jet so unable to   estimate PA systolic pressure.   Neuro/Psych    GI/Hepatic GERD  ,  Endo/Other    Renal/GU      Musculoskeletal  (+) Arthritis ,   Abdominal   Peds  Hematology   Anesthesia Other Findings   Reproductive/Obstetrics                             Anesthesia Physical Anesthesia Plan  ASA: III  Anesthesia Plan: Spinal   Post-op Pain Management:  Regional for Post-op pain   Induction: Intravenous  PONV Risk Score and Plan: 2 and Ondansetron and Dexamethasone  Airway Management Planned:   Additional Equipment:   Intra-op Plan:   Post-operative Plan:   Informed  Consent:   Dental advisory given  Plan Discussed with: CRNA  Anesthesia Plan Comments:         Anesthesia Quick Evaluation

## 2016-11-08 NOTE — Transfer of Care (Signed)
Immediate Anesthesia Transfer of Care Note  Patient: Randy Sullivan  Procedure(s) Performed: Procedure(s) with comments: LEFT TOTAL KNEE ARTHROPLASTY (Left) - Adductor Block  Patient Location: PACU  Anesthesia Type:Spinal  Level of Consciousness:  sedated, patient cooperative and responds to stimulation  Airway & Oxygen Therapy:Patient Spontanous Breathing and Patient connected to face mask oxgen  Post-op Assessment:  Report given to PACU RN and Post -op Vital signs reviewed and stable  Post vital signs:  Reviewed and stable  Last Vitals:  Vitals:   11/08/16 0855 11/08/16 0900  BP: (!) 154/86 (!) 141/64  Pulse: 85 79  Resp: 16 13  Temp:    SpO2: 828% 00%    Complications: No apparent anesthesia complications

## 2016-11-09 LAB — BASIC METABOLIC PANEL
ANION GAP: 7 (ref 5–15)
BUN: 22 mg/dL — ABNORMAL HIGH (ref 6–20)
CHLORIDE: 103 mmol/L (ref 101–111)
CO2: 22 mmol/L (ref 22–32)
Calcium: 8.6 mg/dL — ABNORMAL LOW (ref 8.9–10.3)
Creatinine, Ser: 1.2 mg/dL (ref 0.61–1.24)
GFR calc non Af Amer: 53 mL/min — ABNORMAL LOW (ref >60)
GLUCOSE: 124 mg/dL — AB (ref 65–99)
Potassium: 4.3 mmol/L (ref 3.5–5.1)
Sodium: 132 mmol/L — ABNORMAL LOW (ref 135–145)

## 2016-11-09 LAB — CBC
HEMATOCRIT: 27.9 % — AB (ref 39.0–52.0)
HEMOGLOBIN: 10.2 g/dL — AB (ref 13.0–17.0)
MCH: 32.5 pg (ref 26.0–34.0)
MCHC: 36.6 g/dL — AB (ref 30.0–36.0)
MCV: 88.9 fL (ref 78.0–100.0)
Platelets: 241 10*3/uL (ref 150–400)
RBC: 3.14 MIL/uL — ABNORMAL LOW (ref 4.22–5.81)
RDW: 12.8 % (ref 11.5–15.5)
WBC: 12.5 10*3/uL — AB (ref 4.0–10.5)

## 2016-11-09 MED ORDER — OXYCODONE HCL 5 MG PO TABS: 5.0000 mg | ORAL_TABLET | ORAL | 0 refills | Status: DC | PRN

## 2016-11-09 MED ORDER — TIZANIDINE HCL 4 MG PO TABS: 4.0000 mg | ORAL_TABLET | Freq: Three times a day (TID) | ORAL | 0 refills | Status: DC

## 2016-11-09 MED ORDER — TRAMADOL HCL 50 MG PO TABS: 50.0000 mg | ORAL_TABLET | Freq: Four times a day (QID) | ORAL | 0 refills | Status: DC | PRN

## 2016-11-09 MED ORDER — OMEPRAZOLE 20 MG PO CPDR
20.0000 mg | DELAYED_RELEASE_CAPSULE | Freq: Every day | ORAL | Status: DC
Start: 2016-11-09 — End: 2016-11-12
  Administered 2016-11-09 – 2016-11-12 (×4): 20 mg via ORAL
  Filled 2016-11-09 (×4): qty 1

## 2016-11-09 MED ORDER — RIVAROXABAN 10 MG PO TABS: 10.0000 mg | ORAL_TABLET | Freq: Every day | ORAL | 0 refills | Status: DC

## 2016-11-09 NOTE — Progress Notes (Signed)
Physical Therapy Treatment Patient Details Name: Randy Sullivan MRN: [id number] DOB: 1928-08-23 Today's Date: 11/09/2016    History of Present Illness Left TKA, right knee has issues with pain, buckling,locking up.    PT Comments    POD # 1 pm session Pt progressing slowly with his mobility and demonstrates impaired safety cognition.  Required repeat functional instructions/directions.  Pt also HOH so difficult to assess impairment.  Gait is unsteady with poor self correction to midline with any LOB.  HIGH FALL RISK.  Pt cont to require assist to self rise demonstrating poor balance.     Follow Up Recommendations  SNF;Supervision/Assistance - 24 hour     Equipment Recommendations  None recommended by PT    Recommendations for Other Services       Precautions / Restrictions Precautions Precautions: Fall;Knee Precaution Comments: instructed on KI use for amb and stairs for increased support  Required Braces or Orthoses: Knee Immobilizer - Left;Other Brace/Splint Knee Immobilizer - Left: Discontinue once straight leg raise with < 10 degree lag Restrictions Weight Bearing Restrictions: No Other Position/Activity Restrictions: WBAT     Mobility  Bed Mobility Overal bed mobility: Needs Assistance Bed Mobility: Sit to Supine     Supine to sit: Max assist Sit to supine: Max assist   General bed mobility comments: assisted back to bed with 50% VC's   Transfers Overall transfer level: Needs assistance Equipment used: Rolling walker (2 wheeled) Transfers: Sit to/from Stand Sit to Stand: Min assist;Mod assist         General transfer comment: 50% VC's for safety and proper tech using hands.  Initial posterior LOB with great diffuculty achieving self rise.  Unsteady.  required x 2 attempts.    Ambulation/Gait Ambulation/Gait assistance: Mod assist;+2 physical assistance;+2 safety/equipment Ambulation Distance (Feet): 19 Feet Assistive device: Rolling walker (2  wheeled) Gait Pattern/deviations: Step-to pattern;Shuffle;Festinating Gait velocity: decreased   General Gait Details: tolerated an increased distance however VERY unsteady/shaky/lateral LOB with poor quick reflex/self righting.  HIGH FALL RISK. Pt gets off balanced and is unable to self corect.     Stairs            Wheelchair Mobility    Modified Rankin (Stroke Patients Only)       Balance                                            Cognition Arousal/Alertness: Awake/alert Behavior During Therapy: Impulsive Overall Cognitive Status: No family/caregiver present to determine baseline cognitive functioning                                 General Comments: required repeat instructions.  Demonstrated impaired safety cognition.  Also HOH.  Required ALOT instruction during session.       Exercises      General Comments        Pertinent Vitals/Pain Pain Assessment: 0-10 Pain Score: 3  Pain Location: L knee Pain Descriptors / Indicators: Discomfort;Sore;Operative site guarding Pain Intervention(s): Monitored during session;Repositioned;Ice applied    Home Living Family/patient expects to be discharged to:: Unsure Living Arrangements: Spouse/significant other Available Help at Discharge: Family         Home Equipment: Bedside commode;Walker - 2 wheels Additional Comments: his floor has a tub; big walk in shower downstairs  Wife is undergoing  chemo    Prior Function Level of Independence: Independent with assistive device(s)          PT Goals (current goals can now be found in the care plan section) Acute Rehab PT Goals Patient Stated Goal[place] Progress towards PT goals: Progressing toward goals    Frequency    7X/week      PT Plan Current plan remains appropriate    Co-evaluation              AM-PAC PT "6 Clicks" Daily Activity  Outcome Measure  Difficulty turning over in bed (including  adjusting bedclothes, sheets and blankets)?: Unable Difficulty moving from lying on back to sitting on the side of the bed? : Unable Difficulty sitting down on and standing up from a chair with arms (e.g., wheelchair, bedside commode, etc,.)?: Unable Help needed moving to and from a bed to chair (including a wheelchair)?: Total Help needed walking in hospital room?: Total Help needed climbing 3-5 steps with a railing? : Total 6 Click Score: 6    End of Session Equipment Utilized During Treatment: Gait belt;Left knee immobilizer Activity Tolerance: Patient tolerated treatment well Patient left: in chair;with chair alarm set;with call bell/phone within reach Nurse Communication: Mobility status PT Visit Diagnosis: Unsteadiness on feet (R26.81);Difficulty in walking, not elsewhere classified (R26.2);Pain Pain - Right/Left: Right Pain - part of body: Knee     Time: 1412-1430 PT Time Calculation (min) (ACUTE ONLY): 18 min  Charges:  $Gait Training: 8-22 mins                    G Codes:       {Willey Due  PTA [facility]  Acute  Rehab Pager      [phone]

## 2016-11-09 NOTE — Progress Notes (Signed)
Lives with spouse in a 1 story home with 2 steps. No DME needs, has a RW and toilets are elevated. Scheduled to start OP PT [place] on 11-12-16. [phone]

## 2016-11-09 NOTE — Progress Notes (Signed)
   Subjective: 1 Day Post-Op Procedure(s) (LRB): LEFT TOTAL KNEE ARTHROPLASTY (Left) Patient reports pain as mild.   Patient seen in rounds for Dr. Wynelle Link. Patient is well, but has had some minor complaints of pain in the knee, requiring pain medications We will resume  therapy today. He walked about 4 feet yesterday. Plan is to go Home after hospital stay.  Objective: Vital signs in last 24 hours: Temp:  [97.3 F (36.3 C)-98.4 F (36.9 C)] 98.4 F (36.9 C) (09/18 0551) Pulse Rate:  [74-92] 92 (09/18 0551) Resp:  [10-16] 16 (09/18 0551) BP: (123-175)/(62-98) 125/62 (09/18 0214) SpO2:  [94 %-100 %] 94 % (09/18 0551) Weight:  [83.5 kg (184 lb)] 83.5 kg (184 lb) (09/17 1245)  Intake/Output from previous day:  Intake/Output Summary (Last 24 hours) at 11/09/16 0850 Last data filed at 11/09/16 0600  Gross per 24 hour  Intake          4006.67 ml  Output             2910 ml  Net          1096.67 ml    Intake/Output this shift: No intake/output data recorded.  Labs:  Recent Labs  11/09/16 0604  HGB 10.2*    Recent Labs  11/09/16 0604  WBC 12.5*  RBC 3.14*  HCT 27.9*  PLT 241    Recent Labs  11/09/16 0604  NA 132*  K 4.3  CL 103  CO2 22  BUN 22*  CREATININE 1.20  GLUCOSE 124*  CALCIUM 8.6*   No results for input(s): LABPT, INR in the last 72 hours.  EXAM General - Patient is Alert, Appropriate and Oriented Extremity - Neurovascular intact Sensation intact distally Intact pulses distally Dorsiflexion/Plantar flexion intact Dressing - dressing C/D/I Motor Function - intact, moving foot and toes well on exam.  Hemovac pulled without difficulty.  Past Medical History:  Diagnosis Date  . BPH (benign prostatic hyperplasia)   . Cancer ([place])    non melanoma skin cancers removed  . DJD (degenerative joint disease)   . Familial tremor   . GERD (gastroesophageal reflux disease)   . Heart murmur    hx of  . Hyperlipidemia   . Hypertension   .  Hypothyroidism   . Mood disorder ([place])   . Osteoarthritis   . Prostatism   . Shortness of breath    mild with excertion  . Sleep apnea    cpap  . Thyroid disease     Assessment/Plan: 1 Day Post-Op Procedure(s) (LRB): LEFT TOTAL KNEE ARTHROPLASTY (Left) Principal Problem:   Osteoarthritis of knee Active Problems:   OA (osteoarthritis) of knee  Estimated body mass index is 24.95 kg/m as calculated from the following:   Height as of this encounter: 6' (1.829 m).   Weight as of this encounter: 83.5 kg (184 lb). Up with therapy Plan for discharge tomorrow  DVT Prophylaxis - Xarelto Weight-Bearing as tolerated to left leg D/C O2 and Pulse OX and try on Room Air  Arlee Muslim, PA-C Orthopaedic Surgery 11/09/2016, 8:50 AM

## 2016-11-09 NOTE — Discharge Summary (Signed)
Physician Discharge Summary   Patient ID: Randy Sullivan MRN: [id number] DOB/AGE: 1928/11/04 83 y.o.  Admit date: 11/08/2016 Discharge date: 11/12/2016  Primary Diagnosis:  Osteoarthritis  Left knee(s) Admission Diagnoses:  Past Medical History:  Diagnosis Date  . BPH (benign prostatic hyperplasia)   . Cancer ([place])    non melanoma skin cancers removed  . DJD (degenerative joint disease)   . Familial tremor   . GERD (gastroesophageal reflux disease)   . Heart murmur    hx of  . Hyperlipidemia   . Hypertension   . Hypothyroidism   . Mood disorder ([place])   . Osteoarthritis   . Prostatism   . Shortness of breath    mild with excertion  . Sleep apnea    cpap  . Thyroid disease    Discharge Diagnoses:   Principal Problem:   Osteoarthritis of knee Active Problems:   OA (osteoarthritis) of knee  Estimated body mass index is 24.95 kg/m as calculated from the following:   Height as of this encounter: 6' (1.829 m).   Weight as of this encounter: 83.5 kg (184 lb).  Procedure:  Procedure(s) (LRB): LEFT TOTAL KNEE ARTHROPLASTY (Left)   Consults: None  HPI: Randy Sullivan is a 83 y.o. year old male with end stage OA of his left knee with progressively worsening pain and dysfunction. He has constant pain, with activity and at rest and significant functional deficits with difficulties even with ADLs. He has had extensive non-op management including analgesics, injections of cortisone and viscosupplements, and home exercise program, but remains in significant pain with significant dysfunction. Radiographs show bone on bone arthritis medial and patellofemoral. He presents now for left Total Knee Arthroplasty.   Laboratory Data: Admission on 11/08/2016  Component Date Value Ref Range Status  . WBC 11/09/2016 12.5* 4.0 - 10.5 K/uL Final  . RBC 11/09/2016 3.14* 4.22 - 5.81 MIL/uL Final  . Hemoglobin 11/09/2016 10.2* 13.0 - 17.0 g/dL Final  . HCT 11/09/2016 27.9* 39.0 - 52.0 % Final    . MCV 11/09/2016 88.9  78.0 - 100.0 fL Final  . MCH 11/09/2016 32.5  26.0 - 34.0 pg Final  . MCHC 11/09/2016 36.6* 30.0 - 36.0 g/dL Final  . RDW 11/09/2016 12.8  11.5 - 15.5 % Final  . Platelets 11/09/2016 241  150 - 400 K/uL Final  . Sodium 11/09/2016 132* 135 - 145 mmol/L Final  . Potassium 11/09/2016 4.3  3.5 - 5.1 mmol/L Final  . Chloride 11/09/2016 103  101 - 111 mmol/L Final  . CO2 11/09/2016 22  22 - 32 mmol/L Final  . Glucose, Bld 11/09/2016 124* 65 - 99 mg/dL Final  . BUN 11/09/2016 22* 6 - 20 mg/dL Final  . Creatinine, Ser 11/09/2016 1.20  0.61 - 1.24 mg/dL Final  . Calcium 11/09/2016 8.6* 8.9 - 10.3 mg/dL Final  . GFR calc non Af Amer 11/09/2016 53* >60 mL/min Final  . GFR calc Af Amer 11/09/2016 >60  >60 mL/min Final   Comment: (NOTE) The eGFR has been calculated using the CKD EPI equation. This calculation has not been validated in all clinical situations. eGFR's persistently <60 mL/min signify possible Chronic Kidney Disease.   . Anion gap 11/09/2016 7  5 - 15 Final  . WBC 11/10/2016 11.0* 4.0 - 10.5 K/uL Final  . RBC 11/10/2016 3.00* 4.22 - 5.81 MIL/uL Final  . Hemoglobin 11/10/2016 9.7* 13.0 - 17.0 g/dL Final  . HCT 11/10/2016 26.9* 39.0 - 52.0 % Final  . MCV  11/10/2016 89.7  78.0 - 100.0 fL Final  . MCH 11/10/2016 32.3  26.0 - 34.0 pg Final  . MCHC 11/10/2016 36.1* 30.0 - 36.0 g/dL Final  . RDW 11/10/2016 12.9  11.5 - 15.5 % Final  . Platelets 11/10/2016 256  150 - 400 K/uL Final  . Sodium 11/10/2016 132* 135 - 145 mmol/L Final  . Potassium 11/10/2016 4.3  3.5 - 5.1 mmol/L Final  . Chloride 11/10/2016 102  101 - 111 mmol/L Final  . CO2 11/10/2016 23  22 - 32 mmol/L Final  . Glucose, Bld 11/10/2016 112* 65 - 99 mg/dL Final  . BUN 11/10/2016 21* 6 - 20 mg/dL Final  . Creatinine, Ser 11/10/2016 1.07  0.61 - 1.24 mg/dL Final  . Calcium 11/10/2016 8.8* 8.9 - 10.3 mg/dL Final  . GFR calc non Af Amer 11/10/2016 >60  >60 mL/min Final  . GFR calc Af Amer 11/10/2016  >60  >60 mL/min Final   Comment: (NOTE) The eGFR has been calculated using the CKD EPI equation. This calculation has not been validated in all clinical situations. eGFR's persistently <60 mL/min signify possible Chronic Kidney Disease.   . Anion gap 11/10/2016 7  5 - 15 Final  . WBC 11/11/2016 9.5  4.0 - 10.5 K/uL Final  . RBC 11/11/2016 2.96* 4.22 - 5.81 MIL/uL Final  . Hemoglobin 11/11/2016 9.5* 13.0 - 17.0 g/dL Final  . HCT 11/11/2016 26.7* 39.0 - 52.0 % Final  . MCV 11/11/2016 90.2  78.0 - 100.0 fL Final  . MCH 11/11/2016 32.1  26.0 - 34.0 pg Final  . MCHC 11/11/2016 35.6  30.0 - 36.0 g/dL Final  . RDW 11/11/2016 13.1  11.5 - 15.5 % Final  . Platelets 11/11/2016 249  150 - 400 K/uL Final  Hospital Outpatient Visit on 11/01/2016  Component Date Value Ref Range Status  . aPTT 11/01/2016 32  24 - 36 seconds Final  . WBC 11/01/2016 8.6  4.0 - 10.5 K/uL Final  . RBC 11/01/2016 3.87* 4.22 - 5.81 MIL/uL Final  . Hemoglobin 11/01/2016 12.4* 13.0 - 17.0 g/dL Final  . HCT 11/01/2016 34.0* 39.0 - 52.0 % Final  . MCV 11/01/2016 87.9  78.0 - 100.0 fL Final  . MCH 11/01/2016 32.0  26.0 - 34.0 pg Final  . MCHC 11/01/2016 36.5* 30.0 - 36.0 g/dL Final  . RDW 11/01/2016 12.9  11.5 - 15.5 % Final  . Platelets 11/01/2016 248  150 - 400 K/uL Final  . Sodium 11/01/2016 133* 135 - 145 mmol/L Final  . Potassium 11/01/2016 4.5  3.5 - 5.1 mmol/L Final  . Chloride 11/01/2016 104  101 - 111 mmol/L Final  . CO2 11/01/2016 21* 22 - 32 mmol/L Final  . Glucose, Bld 11/01/2016 97  65 - 99 mg/dL Final  . BUN 11/01/2016 22* 6 - 20 mg/dL Final  . Creatinine, Ser 11/01/2016 1.17  0.61 - 1.24 mg/dL Final  . Calcium 11/01/2016 9.3  8.9 - 10.3 mg/dL Final  . Total Protein 11/01/2016 7.2  6.5 - 8.1 g/dL Final  . Albumin 11/01/2016 3.9  3.5 - 5.0 g/dL Final  . AST 11/01/2016 19  15 - 41 U/L Final  . ALT 11/01/2016 13* 17 - 63 U/L Final  . Alkaline Phosphatase 11/01/2016 63  38 - 126 U/L Final  . Total Bilirubin  11/01/2016 0.6  0.3 - 1.2 mg/dL Final  . GFR calc non Af Amer 11/01/2016 55* >60 mL/min Final  . GFR calc Af Amer 11/01/2016 >60  >  60 mL/min Final   Comment: (NOTE) The eGFR has been calculated using the CKD EPI equation. This calculation has not been validated in all clinical situations. eGFR's persistently <60 mL/min signify possible Chronic Kidney Disease.   . Anion gap 11/01/2016 8  5 - 15 Final  . Prothrombin Time 11/01/2016 13.4  11.4 - 15.2 seconds Final  . INR 11/01/2016 1.03   Final  . ABO/RH(D) 11/01/2016 O NEG   Final  . Antibody Screen 11/01/2016 NEG   Final  . Sample Expiration 11/01/2016 11/11/2016   Final  . Extend sample reason 11/01/2016 NO TRANSFUSIONS OR PREGNANCY IN THE PAST 3 MONTHS   Final  . MRSA, PCR 11/01/2016 NEGATIVE  NEGATIVE Final  . Staphylococcus aureus 11/01/2016 NEGATIVE  NEGATIVE Final   Comment: (NOTE) The Xpert SA Assay (FDA approved for NASAL specimens in patients 52 years of age and older), is one component of a comprehensive surveillance program. It is not intended to diagnose infection nor to guide or monitor treatment.   . ABO/RH(D) 11/01/2016 O NEG   Final     X-Rays:No results found.  EKG: Orders placed or performed in visit on 02/05/16  . EKG 12-Lead     Hospital Course: Randy Sullivan is a 83 y.o. who was admitted to [facility]. They were brought to the operating room on 11/08/2016 and underwent Procedure(s): LEFT TOTAL KNEE ARTHROPLASTY.  Patient tolerated the procedure well and was later transferred to the recovery room and then to the orthopaedic floor for postoperative care.  They were given PO and IV analgesics for pain control following their surgery.  They were given 24 hours of postoperative antibiotics of  Anti-infectives    Start     Dose/Rate Route Frequency Ordered Stop   11/08/16 1530  ceFAZolin (ANCEF) IVPB 2g/100 mL premix     2 g 200 mL/hr over 30 Minutes Intravenous Every 6 hours 11/08/16 1250 11/08/16 2135    11/08/16 0655  ceFAZolin (ANCEF) 2-4 GM/100ML-% IVPB    Comments:  [place] Session   : cabinet override      11/08/16 0655 11/08/16 0930   11/08/16 0651  ceFAZolin (ANCEF) IVPB 2g/100 mL premix     2 g 200 mL/hr over 30 Minutes Intravenous On call to O.R. 11/08/16 7341 11/08/16 1000     and started on DVT prophylaxis in the form of Xarelto.   PT and OT were ordered for total joint protocol.  Discharge planning consulted to help with postop disposition and equipment needs.  Patient had a tough night on the evening of surgery with some pain.  They started to get up OOB with therapy on day of surgery and took a few steps and then back up again on POD 1. Hemovac drain was pulled without difficulty.  Continued to work with therapy into day two.  Dressing was changed on day two and the incision was healing well.  By day three, the patient had not progressed well with therapy and had not met all their goals.  Incision was healing well.  Kept in house for further therapy to increase his independence.  Slow progression so social worker consulted to look into a SNF as backup.  Patient was seen in rounds on POD 4 and doing okay but not ready to go home.  Bed found at [facility], patient seen by Dr. Wynelle Link and was ready to go to the SNF of choice.   Discharge to SNF Diet - Cardiac diet Follow up - in  2 weeks Activity - WBAT Disposition - Skilled nursing facility Condition Upon Discharge - Stable D/C Meds - See DC Summary DVT Prophylaxis - Xarelto   Discharge Instructions    Call MD / Call 911    Complete by:  As directed    If you experience chest pain or shortness of breath, CALL 911 and be transported to the hospital emergency room.  If you develope a fever above 101 F, pus (white drainage) or increased drainage or redness at the wound, or calf pain, call your surgeon's office.   Change dressing    Complete by:  As directed    Change dressing daily with sterile 4 x 4 inch gauze dressing and apply TED  hose. Do not submerge the incision under water.   Constipation Prevention    Complete by:  As directed    Drink plenty of fluids.  Prune juice may be helpful.  You may use a stool softener, such as Colace (over the counter) 100 mg twice a day.  Use MiraLax (over the counter) for constipation as needed.   Diet - low sodium heart healthy    Complete by:  As directed    Discharge instructions    Complete by:  As directed    Take Xarelto for two and a half more weeks, then discontinue Xarelto. Once the patient has completed the Xarelto, they may resume the 81 mg Aspirin.   Pick up stool softner and laxative for home use following surgery while on pain medications. Do not submerge incision under water. Please use good hand washing techniques while changing dressing each day. May shower starting three days after surgery. Please use a clean towel to pat the incision dry following showers. Continue to use ice for pain and swelling after surgery. Do not use any lotions or creams on the incision until instructed by your surgeon.  Wear both TED hose on both legs during the day every day for three weeks, but may remove the TED hose at night at home.  Postoperative Constipation Protocol  Constipation - defined medically as fewer than three stools per week and severe constipation as less than one stool per week.  One of the most common issues patients have following surgery is constipation.  Even if you have a regular bowel pattern at home, your normal regimen is likely to be disrupted due to multiple reasons following surgery.  Combination of anesthesia, postoperative narcotics, change in appetite and fluid intake all can affect your bowels.  In order to avoid complications following surgery, here are some recommendations in order to help you during your recovery period.  Colace (docusate) - Pick up an over-the-counter form of Colace or another stool softener and take twice a day as long as you are  requiring postoperative pain medications.  Take with a full glass of water daily.  If you experience loose stools or diarrhea, hold the colace until you stool forms back up.  If your symptoms do not get better within 1 week or if they get worse, check with your doctor.  Dulcolax (bisacodyl) - Pick up over-the-counter and take as directed by the product packaging as needed to assist with the movement of your bowels.  Take with a full glass of water.  Use this product as needed if not relieved by Colace only.   MiraLax (polyethylene glycol) - Pick up over-the-counter to have on hand.  MiraLax is a solution that will increase the amount of water in your bowels to assist  with bowel movements.  Take as directed and can mix with a glass of water, juice, soda, coffee, or tea.  Take if you go more than two days without a movement. Do not use MiraLax more than once per day. Call your doctor if you are still constipated or irregular after using this medication for 7 days in a row.  If you continue to have problems with postoperative constipation, please contact the office for further assistance and recommendations.  If you experience "the worst abdominal pain ever" or develop nausea or vomiting, please contact the office immediatly for further recommendations for treatment.  When discharged from the skilled rehab facility, please have the facility set up the patient's [place] prior to being released.  Please make sure this gets set up prior to release in order to avoid any lapse of therapy following the rehab stay.  Also provide the patient with their medications at time of release from the facility to include their pain medication, the muscle relaxants, and their blood thinner medication.  If the patient is still at the rehab facility at time of follow up appointment, please also assist the patient in arranging follow up appointment in our office and any transportation needs. ICE to the  affected knee or hip every three hours for 30 minutes at a time and then as needed for pain and swelling.    Do not put a pillow under the knee. Place it under the heel.    Complete by:  As directed    Do not sit on low chairs, stoools or toilet seats, as it may be difficult to get up from low surfaces    Complete by:  As directed    Driving restrictions    Complete by:  As directed    No driving until released by the physician.   Increase activity slowly as tolerated    Complete by:  As directed    Lifting restrictions    Complete by:  As directed    No lifting until released by the physician.   Patient may shower    Complete by:  As directed    You may shower without a dressing once there is no drainage.  Do not wash over the wound.  If drainage remains, do not shower until drainage stops.   TED hose    Complete by:  As directed    Use stockings (TED hose) for 3 weeks on both leg(s).  You may remove them at night for sleeping.   Weight bearing as tolerated    Complete by:  As directed    Laterality:  left   Extremity:  Lower     Allergies as of 11/12/2016   No Known Allergies     Medication List    STOP taking these medications   aspirin 81 MG tablet   B-12 5000 MCG Caps   Vitamin D 1000 units capsule     TAKE these medications   acetaminophen 325 MG tablet Commonly known as:  TYLENOL Take 2 tablets (650 mg total) by mouth every 6 (six) hours as needed for mild pain (or Fever >/= 101).   albuterol 108 (90 Base) MCG/ACT inhaler Commonly known as:  PROVENTIL HFA;VENTOLIN HFA Inhale 2 puffs into the lungs every 4 (four) hours as needed for wheezing or shortness of breath.   artificial tears Oint ophthalmic ointment Commonly known as:  LACRILUBE Place into both eyes daily.   bisacodyl 10 MG suppository Commonly known as:  DULCOLAX Place 1 suppository (10 mg total) rectally daily as needed for moderate constipation.   diphenhydrAMINE 12.5 MG/5ML elixir Commonly  known as:  BENADRYL Take 5-10 mLs (12.5-25 mg total) by mouth every 4 (four) hours as needed for itching.   docusate sodium 100 MG capsule Commonly known as:  COLACE Take 1 capsule (100 mg total) by mouth 2 (two) times daily.   fluticasone 50 MCG/ACT nasal spray Commonly known as:  FLONASE Place 1 spray into both nostrils daily as needed for allergies.   fluticasone furoate-vilanterol 100-25 MCG/INH Aepb Commonly known as:  BREO ELLIPTA Inhale 1 puff into the lungs daily. What changed:  when to take this   gabapentin 300 MG capsule Commonly known as:  NEURONTIN Take 300 mg by mouth at bedtime.   levothyroxine 112 MCG tablet Commonly known as:  SYNTHROID, LEVOTHROID Take 112 mcg by mouth daily before breakfast.   loratadine 10 MG tablet Commonly known as:  CLARITIN Take 10 mg by mouth daily.   megestrol 20 MG tablet Commonly known as:  MEGACE Take 40 mg by mouth daily before breakfast.   montelukast 10 MG tablet Commonly known as:  SINGULAIR Take 1 tablet (10 mg total) by mouth at bedtime.   MYRBETRIQ 50 MG Tb24 tablet Generic drug:  mirabegron ER Take 50 mg by mouth daily.   omeprazole 20 MG capsule Commonly known as:  PRILOSEC Take 20 mg by mouth 2 (two) times daily before a meal.   oxyCODONE 5 MG immediate release tablet Commonly known as:  Oxy IR/ROXICODONE Take 1-2 tablets (5-10 mg total) by mouth every 4 (four) hours as needed for moderate pain or severe pain.   polyethylene glycol packet Commonly known as:  MIRALAX / GLYCOLAX Take 17 g by mouth daily as needed for mild constipation.   propranolol 20 MG tablet Commonly known as:  INDERAL Take 20 mg by mouth daily.   rivaroxaban 10 MG Tabs tablet Commonly known as:  XARELTO Take 1 tablet (10 mg total) by mouth daily with breakfast. Take Xarelto for two and a half more weeks following discharge from the hospital, then discontinue Xarelto. Once the patient has completed the Xarelto, they may resume the 81  mg Aspirin.   rosuvastatin 5 MG tablet Commonly known as:  CRESTOR Take 5 mg by mouth at bedtime.   sodium phosphate 7-19 GM/118ML Enem Place 133 mLs (1 enema total) rectally once as needed for severe constipation.   solifenacin 5 MG tablet Commonly known as:  VESICARE Take 5 mg by mouth every evening.   SYSTANE OP Apply 1 drop to eye daily.   tiZANidine 4 MG tablet Commonly known as:  ZANAFLEX Take 1 tablet (4 mg total) by mouth 3 (three) times daily.   traMADol 50 MG tablet Commonly known as:  ULTRAM Take 1-2 tablets (50-100 mg total) by mouth every 6 (six) hours as needed for moderate pain.            Discharge Care Instructions        Start     Ordered   11/13/16 0000  artificial tears (LACRILUBE) OINT ophthalmic ointment  Daily    Question:  Supervising Provider  Answer:  Gaynelle Arabian   11/12/16 0843   11/12/16 0000  acetaminophen (TYLENOL) 325 MG tablet  Every 6 hours PRN    Question:  Supervising Provider  Answer:  Gaynelle Arabian   11/12/16 0843   11/12/16 0000  bisacodyl (DULCOLAX) 10 MG suppository  Daily PRN  Question:  Supervising Provider  Answer:  Gaynelle Arabian   11/12/16 0843   11/12/16 0000  diphenhydrAMINE (BENADRYL) 12.5 MG/5ML elixir  Every 4 hours PRN    Question:  Supervising Provider  Answer:  Gaynelle Arabian   11/12/16 0843   11/12/16 0000  docusate sodium (COLACE) 100 MG capsule  2 times daily    Question:  Supervising Provider  Answer:  Gaynelle Arabian   11/12/16 0843   11/12/16 0000  oxyCODONE (OXY IR/ROXICODONE) 5 MG immediate release tablet  Every 4 hours PRN    Question:  Supervising Provider  Answer:  Gaynelle Arabian   11/12/16 0843   11/12/16 0000  rivaroxaban (XARELTO) 10 MG TABS tablet  Daily with breakfast    Question:  Supervising Provider  Answer:  Gaynelle Arabian   11/12/16 0843   11/12/16 0000  sodium phosphate (FLEET) 7-19 GM/118ML ENEM  Once PRN    Question:  Supervising Provider  Answer:  Gaynelle Arabian   11/12/16 0843     11/12/16 0000  tiZANidine (ZANAFLEX) 4 MG tablet  3 times daily    Question:  Supervising Provider  Answer:  Gaynelle Arabian   11/12/16 0843   11/12/16 0000  traMADol (ULTRAM) 50 MG tablet  Every 6 hours PRN    Question:  Supervising Provider  Answer:  Gaynelle Arabian   11/12/16 0843   11/12/16 0000  Discharge instructions    Comments:  Take Xarelto for two and a half more weeks, then discontinue Xarelto. Once the patient has completed the Xarelto, they may resume the 81 mg Aspirin.   Pick up stool softner and laxative for home use following surgery while on pain medications. Do not submerge incision under water. Please use good hand washing techniques while changing dressing each day. May shower starting three days after surgery. Please use a clean towel to pat the incision dry following showers. Continue to use ice for pain and swelling after surgery. Do not use any lotions or creams on the incision until instructed by your surgeon.  Wear both TED hose on both legs during the day every day for three weeks, but may remove the TED hose at night at home.  Postoperative Constipation Protocol  Constipation - defined medically as fewer than three stools per week and severe constipation as less than one stool per week.  One of the most common issues patients have following surgery is constipation.  Even if you have a regular bowel pattern at home, your normal regimen is likely to be disrupted due to multiple reasons following surgery.  Combination of anesthesia, postoperative narcotics, change in appetite and fluid intake all can affect your bowels.  In order to avoid complications following surgery, here are some recommendations in order to help you during your recovery period.  Colace (docusate) - Pick up an over-the-counter form of Colace or another stool softener and take twice a day as long as you are requiring postoperative pain medications.  Take with a full glass of water daily.  If  you experience loose stools or diarrhea, hold the colace until you stool forms back up.  If your symptoms do not get better within 1 week or if they get worse, check with your doctor.  Dulcolax (bisacodyl) - Pick up over-the-counter and take as directed by the product packaging as needed to assist with the movement of your bowels.  Take with a full glass of water.  Use this product as needed if not relieved by Colace only.  MiraLax (polyethylene glycol) - Pick up over-the-counter to have on hand.  MiraLax is a solution that will increase the amount of water in your bowels to assist with bowel movements.  Take as directed and can mix with a glass of water, juice, soda, coffee, or tea.  Take if you go more than two days without a movement. Do not use MiraLax more than once per day. Call your doctor if you are still constipated or irregular after using this medication for 7 days in a row.  If you continue to have problems with postoperative constipation, please contact the office for further assistance and recommendations.  If you experience "the worst abdominal pain ever" or develop nausea or vomiting, please contact the office immediatly for further recommendations for treatment.  When discharged from the skilled rehab facility, please have the facility set up the patient's [place] prior to being released.  Please make sure this gets set up prior to release in order to avoid any lapse of therapy following the rehab stay.  Also provide the patient with their medications at time of release from the facility to include their pain medication, the muscle relaxants, and their blood thinner medication.  If the patient is still at the rehab facility at time of follow up appointment, please also assist the patient in arranging follow up appointment in our office and any transportation needs. ICE to the affected knee or hip every three hours for 30 minutes at a time and then as needed for pain  and swelling.   11/12/16 0843   11/09/16 0000  Call MD / Call 911    Comments:  If you experience chest pain or shortness of breath, CALL 911 and be transported to the hospital emergency room.  If you develope a fever above 101 F, pus (white drainage) or increased drainage or redness at the wound, or calf pain, call your surgeon's office.   11/09/16 2252   11/09/16 0000  Diet - low sodium heart healthy     11/09/16 2252   11/09/16 0000  Constipation Prevention    Comments:  Drink plenty of fluids.  Prune juice may be helpful.  You may use a stool softener, such as Colace (over the counter) 100 mg twice a day.  Use MiraLax (over the counter) for constipation as needed.   11/09/16 2252   11/09/16 0000  Increase activity slowly as tolerated     11/09/16 2252   11/09/16 0000  Patient may shower    Comments:  You may shower without a dressing once there is no drainage.  Do not wash over the wound.  If drainage remains, do not shower until drainage stops.   11/09/16 2252   11/09/16 0000  Weight bearing as tolerated    Question Answer Comment  Laterality left   Extremity Lower      11/09/16 2252   11/09/16 0000  Driving restrictions    Comments:  No driving until released by the physician.   11/09/16 2252   11/09/16 0000  Lifting restrictions    Comments:  No lifting until released by the physician.   11/09/16 2252   11/09/16 0000  TED hose    Comments:  Use stockings (TED hose) for 3 weeks on both leg(s).  You may remove them at night for sleeping.   11/09/16 2252   11/09/16 0000  Change dressing    Comments:  Change dressing daily with sterile 4 x 4 inch gauze dressing and  apply TED hose. Do not submerge the incision under water.   11/09/16 2252   11/09/16 0000  Do not put a pillow under the knee. Place it under the heel.     11/09/16 2252   11/09/16 0000  Do not sit on low chairs, stoools or toilet seats, as it may be difficult to get up from low surfaces     11/09/16 2252       Contact information for follow-up providers    Gaynelle Arabian, MD. Schedule an appointment as soon as possible for a visit on 11/23/2016.   Specialty:  Orthopedic Surgery Contact information: [address] [place] [address] [phone]            Contact information for after-discharge care    Destination    [facility] Follow up.   Specialty:  Skilled Nursing Facility Contact information: [place] [phone]                  Signed: Arlee Muslim, PA-C Orthopaedic Surgery 11/12/2016, 8:50 AM

## 2016-11-09 NOTE — Progress Notes (Signed)
Physical Therapy Treatment Patient Details Name: Randy Sullivan MRN: [id number] DOB: February 07, 1929 Today's Date: 11/09/2016    History of Present Illness Left TKA, right knee has issues with pain, buckling,locking up.    PT Comments    POD # 1 am session Applied KI and instructed on use for increased support.  Assisted OOB to amb required max VC's and 2 assist for safety. Very unsteady gait with poor self correction to midline and lateral LOB.  Recliner following for safety.  HIGH FALL RISK.  Returned to room then performed some TKR TE's followed by ICE.   Follow Up Recommendations  SNF;Supervision/Assistance - 24 hour     Equipment Recommendations  None recommended by PT    Recommendations for Other Services       Precautions / Restrictions Precautions Precautions: Fall;Knee Precaution Comments: instructed on KI use for amb and stairs for increased support  Required Braces or Orthoses: Knee Immobilizer - Left;Other Brace/Splint Knee Immobilizer - Left: Discontinue once straight leg raise with < 10 degree lag Restrictions Weight Bearing Restrictions: No Other Position/Activity Restrictions: WBAT     Mobility  Bed Mobility Overal bed mobility: Needs Assistance Bed Mobility: Supine to Sit     Supine to sit: Max assist     General bed mobility comments: 75% VC's on proper tech and assist with L LE  Transfers Overall transfer level: Needs assistance Equipment used: Rolling walker (2 wheeled) Transfers: Sit to/from Stand Sit to Stand: Min assist;Mod assist         General transfer comment: 75% VC's on proper hand placement and forward lean.  Initial posteruior LOB and difficulty to perform self rise.  Unsteady.  75% VC's on safety with turn completion and trageting.   Ambulation/Gait Ambulation/Gait assistance: Mod assist;+2 physical assistance;+2 safety/equipment Ambulation Distance (Feet): 16 Feet Assistive device: Rolling walker (2 wheeled) Gait  Pattern/deviations: Step-to pattern;Shuffle;Festinating Gait velocity: decreased   General Gait Details: tolerated an increased distance however VERY unsteady/shaky/lateral LOB with poor quick reflex/self righting.  HIGH FALL RISK.    Stairs            Wheelchair Mobility    Modified Rankin (Stroke Patients Only)       Balance                                            Cognition Arousal/Alertness: Awake/alert Behavior During Therapy: Impulsive Overall Cognitive Status: No family/caregiver present to determine baseline cognitive functioning                                 General Comments: required repeat instructions.  Demonstrated impaired safety cognition.  Also HOH.  Required ALOT instruction during session.       Exercises   Total Knee Replacement TE's 10 reps B LE ankle pumps 10 reps towel squeezes 10 reps knee presses 10 reps heel slides  10 reps SAQ's 10 reps SLR's 10 reps ABD Followed by ICE     General Comments        Pertinent Vitals/Pain Pain Assessment: 0-10 Pain Score: 3  Pain Location: L knee Pain Descriptors / Indicators: Discomfort;Sore;Operative site guarding Pain Intervention(s): Monitored during session;Repositioned;Ice applied    Home Living Family/patient expects to be discharged to:: Unsure Living Arrangements: Spouse/significant other Available Help at Discharge: Family  Home Equipment: Bedside commode;Walker - 2 wheels Additional Comments: his floor has a tub; big walk in shower downstairs  Wife is undergoing chemo    Prior Function Level of Independence: Independent with assistive device(s)          PT Goals (current goals can now be found in the care plan section) Acute Rehab PT Goals Patient Stated Goal: [place] the beach Progress towards PT goals: Progressing toward goals    Frequency    7X/week      PT Plan Current plan remains appropriate    Co-evaluation               AM-PAC PT "6 Clicks" Daily Activity  Outcome Measure  Difficulty turning over in bed (including adjusting bedclothes, sheets and blankets)?: Unable Difficulty moving from lying on back to sitting on the side of the bed? : Unable Difficulty sitting down on and standing up from a chair with arms (e.g., wheelchair, bedside commode, etc,.)?: Unable Help needed moving to and from a bed to chair (including a wheelchair)?: Total Help needed walking in hospital room?: Total Help needed climbing 3-5 steps with a railing? : Total 6 Click Score: 6    End of Session Equipment Utilized During Treatment: Gait belt;Left knee immobilizer Activity Tolerance: Patient tolerated treatment well Patient left: in chair;with chair alarm set;with call bell/phone within reach Nurse Communication: Mobility status PT Visit Diagnosis: Unsteadiness on feet (R26.81);Difficulty in walking, not elsewhere classified (R26.2);Pain Pain - Right/Left: Right Pain - part of body: Knee     Time: 1020-1048 PT Time Calculation (min) (ACUTE ONLY): 28 min  Charges:  $Gait Training: 8-22 mins $Therapeutic Exercise: 8-22 mins                    G Codes:       Rica Koyanagi  PTA [facility]  Acute  Rehab Pager      [phone]

## 2016-11-09 NOTE — Evaluation (Signed)
Occupational Therapy Evaluation Patient Details Name: Randy Sullivan MRN: [id number] DOB: 25-Dec-1928 Today's Date: 11/09/2016    History of Present Illness Left TKA, right knee has issues with pain, buckling,locking up.   Clinical Impression   This 83 year old man was admitted for the above sx.  Only performed sit to stand with pt this session and he had a posterior LOB.  [name] is undergoing chemo at this time. Will follow in acute setting with min A level goals for toilet transfers and for standing during adls    Follow Up Recommendations  DC plan and follow up therapy as arranged by surgeon (of note, pt's wife undergoing chemo)    Equipment Recommendations  None recommended by OT    Recommendations for Other Services       Precautions / Restrictions Precautions Precautions: Fall;Knee Required Braces or Orthoses: Knee Immobilizer - Left;Other Brace/Splint Knee Immobilizer - Left: Discontinue once straight leg raise with < 10 degree lag Restrictions Weight Bearing Restrictions: No      Mobility Bed Mobility               General bed mobility comments: OOB by PT  Transfers   Equipment used: Rolling walker (2 wheeled)   Sit to Stand: Min assist         General transfer comment: from chair; assist to rise and stabilize.  Pt lost balance posteriorly when he first stood    Balance                                           ADL either performed or assessed with clinical judgement   ADL Overall ADL's : Needs assistance/impaired     Grooming: Set up;Sitting   Upper Body Bathing: Set up;Sitting   Lower Body Bathing: Moderate assistance;Sit to/from stand   Upper Body Dressing : Set up;Sitting   Lower Body Dressing: Maximal assistance;Sit to/from stand                 General ADL Comments: took one step forward and one backwards from chair.  Pt can reach to ankles but he cannot lean over for more than a couple of seconds or he  gets SOB     Vision         Perception     Praxis      Pertinent Vitals/Pain Pain Score: 4  Pain Location: bil knees Pain Descriptors / Indicators: Discomfort;Sore Pain Intervention(s): Limited activity within patient's tolerance;Monitored during session;Premedicated before session;Repositioned;Ice applied     Hand Dominance     Extremity/Trunk Assessment Upper Extremity Assessment Upper Extremity Assessment: RUE deficits/detail (shoulder pain; limited ROM due to this)           Communication Communication Communication: No difficulties   Cognition Arousal/Alertness: Awake/alert Behavior During Therapy: WFL for tasks assessed/performed Overall Cognitive Status: Within Functional Limits for tasks assessed                                     General Comments       Exercises     Shoulder Instructions      Home Living Family/patient expects to be discharged to:: Unsure Living Arrangements: Spouse/significant other Available Help at Discharge: Family  Home Equipment: Bedside commode;Walker - 2 wheels   Additional Comments: his floor has a tub; big walk in shower downstairs  Wife is undergoing chemo      Prior Functioning/Environment Level of Independence: Independent with assistive device(s)                 OT Problem List: Decreased strength;Decreased activity tolerance;Impaired balance (sitting and/or standing);Decreased knowledge of use of DME or AE;Pain      OT Treatment/Interventions: Self-care/ADL training;DME and/or AE instruction;Patient/family education;Balance training;Therapeutic activities    OT Goals(Current goals can be found in the care plan section) Acute Rehab OT Goals Patient Stated Goal: walk on the beach OT Goal Formulation: With patient Time For Goal Achievement: 11/16/16 Potential to Achieve Goals: Good ADL Goals Pt Will Transfer to Toilet: with min assist;bedside  commode;ambulating Pt Will Perform Toileting - Clothing Manipulation and hygiene: with min assist;sit to/from stand Additional ADL Goal #1: pt will go from sit to stand with min A and maintain static standing wtih min guard while wife assists with adls  OT Frequency: Min 2X/week   Barriers to D/C:            Co-evaluation              AM-PAC PT "6 Clicks" Daily Activity     Outcome Measure Help from another person eating meals?: None Help from another person taking care of personal grooming?: A Little Help from another person toileting, which includes using toliet, bedpan, or urinal?: A Lot Help from another person bathing (including washing, rinsing, drying)?: A Lot Help from another person to put on and taking off regular upper body clothing?: A Little Help from another person to put on and taking off regular lower body clothing?: A Lot 6 Click Score: 16   End of Session    Activity Tolerance: Patient tolerated treatment well Patient left: in chair;with call bell/phone within reach;with family/visitor present;with chair alarm set  OT Visit Diagnosis: Pain Pain - Right/Left:  (bil) Pain - part of body: Knee                Time: 1230-1250 OT Time Calculation (min): 20 min Charges:  OT General Charges $OT Visit: 1 Visit OT Evaluation $OT Eval Low Complexity: 1 Low G-Codes:     [place] [facility], OTR/L [phone] 11/09/2016  Randy Sullivan 11/09/2016, 1:51 PM

## 2016-11-10 LAB — CBC
HCT: 26.9 % — ABNORMAL LOW (ref 39.0–52.0)
HEMOGLOBIN: 9.7 g/dL — AB (ref 13.0–17.0)
MCH: 32.3 pg (ref 26.0–34.0)
MCHC: 36.1 g/dL — AB (ref 30.0–36.0)
MCV: 89.7 fL (ref 78.0–100.0)
Platelets: 256 10*3/uL (ref 150–400)
RBC: 3 MIL/uL — ABNORMAL LOW (ref 4.22–5.81)
RDW: 12.9 % (ref 11.5–15.5)
WBC: 11 10*3/uL — ABNORMAL HIGH (ref 4.0–10.5)

## 2016-11-10 LAB — BASIC METABOLIC PANEL
Anion gap: 7 (ref 5–15)
BUN: 21 mg/dL — AB (ref 6–20)
CHLORIDE: 102 mmol/L (ref 101–111)
CO2: 23 mmol/L (ref 22–32)
CREATININE: 1.07 mg/dL (ref 0.61–1.24)
Calcium: 8.8 mg/dL — ABNORMAL LOW (ref 8.9–10.3)
GFR calc Af Amer: >60 mL/min (ref >60)
GFR calc non Af Amer: >60 mL/min (ref >60)
Glucose, Bld: 112 mg/dL — ABNORMAL HIGH (ref 65–99)
Potassium: 4.3 mmol/L (ref 3.5–5.1)
SODIUM: 132 mmol/L — AB (ref 135–145)

## 2016-11-10 NOTE — Progress Notes (Signed)
Physical Therapy Treatment Patient Details Name: Randy Sullivan MRN: [id number] DOB: 09-08-1928 Today's Date: 11/10/2016    History of Present Illness Left TKA, right knee has issues with pain, buckling,locking up.    PT Comments    POD # 2 am session Pt progressing slowly and still present with difficulty safely amb and transferring.  Unsure how much assist spouse can do.    Follow Up Recommendations  SNF;Supervision/Assistance - 24 hour     Equipment Recommendations  None recommended by PT    Recommendations for Other Services       Precautions / Restrictions Precautions Precautions: Fall;Knee Precaution Comments: instructed on KI use for amb and stairs for increased support  Required Braces or Orthoses: Knee Immobilizer - Left;Other Brace/Splint Knee Immobilizer - Left: Discontinue once straight leg raise with < 10 degree lag Restrictions Weight Bearing Restrictions: No Other Position/Activity Restrictions: WBAT     Mobility  Bed Mobility Overal bed mobility: Needs Assistance Bed Mobility: Supine to Sit     Supine to sit: Mod assist     General bed mobility comments: assist with L LE and increased time  Transfers Overall transfer level: Needs assistance Equipment used: Rolling walker (2 wheeled) Transfers: Sit to/from Stand Sit to Stand: Min assist         General transfer comment: cues for UE/LE placement.  Initial posterior LOB/lean.    Ambulation/Gait Ambulation/Gait assistance: Mod assist;Min assist Ambulation Distance (Feet): 28 Feet Assistive device: Rolling walker (2 wheeled) Gait Pattern/deviations: Step-to pattern;Shuffle;Festinating Gait velocity: decreased   General Gait Details: still unsteady requiring assist to esp with turns and initial rise.  HIGH FALL RISK   Stairs            Wheelchair Mobility    Modified Rankin (Stroke Patients Only)       Balance                                             Cognition Arousal/Alertness: Awake/alert Behavior During Therapy: WFL for tasks assessed/performed Overall Cognitive Status: No family/caregiver present to determine baseline cognitive functioning                                 General Comments: cues for safety      Exercises      General Comments        Pertinent Vitals/Pain Pain Assessment: 0-10 Pain Score: 4  Pain Location: L knee Pain Descriptors / Indicators: Discomfort;Operative site guarding Pain Intervention(s): Monitored during session;Repositioned;Ice applied    Home Living                      Prior Function            PT Goals (current goals can now be found in the care plan section) Progress towards PT goals: Progressing toward goals    Frequency    7X/week      PT Plan Current plan remains appropriate    Co-evaluation              AM-PAC PT "6 Clicks" Daily Activity  Outcome Measure  Difficulty turning over in bed (including adjusting bedclothes, sheets and blankets)?: A Lot Difficulty moving from lying on back to sitting on the side of the bed? : A Lot Difficulty sitting down  on and standing up from a chair with arms (e.g., wheelchair, bedside commode, etc,.)?: A Lot Help needed moving to and from a bed to chair (including a wheelchair)?: A Lot Help needed walking in hospital room?: A Lot Help needed climbing 3-5 steps with a railing? : A Lot 6 Click Score: 12    End of Session Equipment Utilized During Treatment: Gait belt;Left knee immobilizer Activity Tolerance: Patient tolerated treatment well Patient left: in chair;with chair alarm set;with call bell/phone within reach Nurse Communication: Mobility status PT Visit Diagnosis: Unsteadiness on feet (R26.81);Difficulty in walking, not elsewhere classified (R26.2);Pain Pain - Right/Left: Right Pain - part of body: Knee     Time: 1751-0258 PT Time Calculation (min) (ACUTE ONLY): 28 min  Charges:  $Gait  Training: 8-22 mins $Therapeutic Activity: 8-22 mins                    G Codes:       Rica Koyanagi  PTA [facility]  Acute  Rehab Pager      [phone]

## 2016-11-10 NOTE — Progress Notes (Signed)
Physical Therapy Treatment Patient Details Name: Randy Sullivan MRN: [id number] DOB: 1928/10/01 Today's Date: 11/10/2016    History of Present Illness Left TKA, right knee has issues with pain, buckling,locking up.    PT Comments    POD # 2 pm session Assisted with amb a greater distance however still demonstrates gait instability and impaired balance.  Pt not safe to amb on his own just yet.  Delayed corrective reaction and posterior LOB with sit to stand and stand to sit.  Assisted back to bed to perform TKR TE's.  Pt lacking in knee extension.  Educated on positioning with focus of keeping 'leg straight".     Follow Up Recommendations  SNF;Supervision/Assistance - 24 hour     Equipment Recommendations  None recommended by PT    Recommendations for Other Services       Precautions / Restrictions Precautions Precautions: Fall;Knee Precaution Comments: instructed on KI use for amb and stairs for increased support  Required Braces or Orthoses: Knee Immobilizer - Left;Other Brace/Splint Knee Immobilizer - Left: Discontinue once straight leg raise with < 10 degree lag Restrictions Weight Bearing Restrictions: No Other Position/Activity Restrictions: WBAT     Mobility  Bed Mobility Overal bed mobility: Needs Assistance Bed Mobility: Sit to Supine     Supine to sit: Mod assist Sit to supine: Mod assist;Max assist   General bed mobility comments: assisted back to bed with increased assist to support L LE onto bed  Transfers Overall transfer level: Needs assistance Equipment used: Rolling walker (2 wheeled) Transfers: Sit to/from Stand Sit to Stand: Min assist         General transfer comment: cues for UE/LE placement.  Initial posterior LOB/lean.  Unsteady with turns.   Ambulation/Gait Ambulation/Gait assistance: Mod assist;Min assist Ambulation Distance (Feet): 37 Feet Assistive device: Rolling walker (2 wheeled) Gait Pattern/deviations: Step-to  pattern;Shuffle;Festinating Gait velocity: decreased   General Gait Details: still unsteady requiring assist to esp with turns and initial rise.  HIGH FALL RISK  Delayed corrective reaction   Stairs            Wheelchair Mobility    Modified Rankin (Stroke Patients Only)       Balance                                            Cognition Arousal/Alertness: Awake/alert Behavior During Therapy: WFL for tasks assessed/performed Overall Cognitive Status: No family/caregiver present to determine baseline cognitive functioning                                 General Comments: cues for safety      Exercises   Total Knee Replacement TE's 10 reps B LE ankle pumps 10 reps towel squeezes 10 reps knee presses 10 reps heel slides  10 reps SAQ's 10 reps SLR's 10 reps ABD Followed by ICE     General Comments        Pertinent Vitals/Pain Pain Assessment: 0-10 Pain Score: 4  Pain Location: L knee Pain Descriptors / Indicators: Discomfort;Operative site guarding Pain Intervention(s): Monitored during session;Repositioned;Ice applied    Home Living                      Prior Function  PT Goals (current goals can now be found in the care plan section) Progress towards PT goals: Progressing toward goals    Frequency    7X/week      PT Plan Current plan remains appropriate    Co-evaluation              AM-PAC PT "6 Clicks" Daily Activity  Outcome Measure  Difficulty turning over in bed (including adjusting bedclothes, sheets and blankets)?: A Lot Difficulty moving from lying on back to sitting on the side of the bed? : A Lot Difficulty sitting down on and standing up from a chair with arms (e.g., wheelchair, bedside commode, etc,.)?: A Lot Help needed moving to and from a bed to chair (including a wheelchair)?: A Lot Help needed walking in hospital room?: A Lot Help needed climbing 3-5 steps with a  railing? : A Lot 6 Click Score: 12    End of Session Equipment Utilized During Treatment: Gait belt;Left knee immobilizer Activity Tolerance: Patient tolerated treatment well Patient left: in chair;with chair alarm set;with call bell/phone within reach Nurse Communication: Mobility status PT Visit Diagnosis: Unsteadiness on feet (R26.81);Difficulty in walking, not elsewhere classified (R26.2);Pain Pain - Right/Left: Right Pain - part of body: Knee     Time: 9371-6967 PT Time Calculation (min) (ACUTE ONLY): 26 min  Charges:  $Gait Training: 8-22 mins $Therapeutic Exercise: 8-22 mins                    G Codes:       Rica Koyanagi  PTA [facility]  Acute  Rehab Pager      [phone]

## 2016-11-10 NOTE — Progress Notes (Signed)
Occupational Therapy Treatment Patient Details Name: Randy Sullivan MRN: [id number] DOB: [birth year] Today's Date: 11/10/2016    History of present illness Left TKA, right knee has issues with pain, buckling,locking up.   OT comments  Sit to stand and ambulation much improved  Follow Up Recommendations  Supervision/Assistance - 24 hour;DC plan and follow up therapy as arranged by surgeon    Equipment Recommendations  None recommended by OT    Recommendations for Other Services      Precautions / Restrictions Precautions Precautions: Fall;Knee Precaution Comments: instructed on KI use for amb and stairs for increased support  Required Braces or Orthoses: Knee Immobilizer - Left;Other Brace/Splint Knee Immobilizer - Left: Discontinue once straight leg raise with < 10 degree lag Restrictions Other Position/Activity Restrictions: WBAT        Mobility Bed Mobility                  Transfers   Equipment used: Rolling walker (2 wheeled)   Sit to Stand: Min assist         General transfer comment: cues for UE/LE placement    Balance                                           ADL either performed or assessed with clinical judgement   ADL       Grooming: Oral care;Min guard;Standing                                 General ADL Comments: ambulated to bathroom with min A and performed oral care. Sat in chair with Energy manager     Praxis      Cognition Arousal/Alertness: Awake/alert Behavior During Therapy: WFL for tasks assessed/performed                                   General Comments: cues for safety        Exercises     Shoulder Instructions       General Comments      Pertinent Vitals/ Pain       Pain Score: 2  Pain Location: L knee Pain Descriptors / Indicators: Discomfort Pain Intervention(s): Limited activity within patient's tolerance;Monitored during  session;Premedicated before session;Repositioned;Ice applied  Home Living                                          Prior Functioning/Environment              Frequency  Min 2X/week        Progress Toward Goals  OT Goals(current goals can now be found in the care plan section)        Plan      Co-evaluation                 AM-PAC PT "6 Clicks" Daily Activity     Outcome Measure   Help from another person eating meals?: None Help from another person taking care of personal grooming?: A Little Help from another person toileting, which includes using toliet, bedpan,  or urinal?: A Lot Help from another person bathing (including washing, rinsing, drying)?: A Lot Help from another person to put on and taking off regular upper body clothing?: A Little Help from another person to put on and taking off regular lower body clothing?: A Lot 6 Click Score: 16    End of Session    OT Visit Diagnosis: Pain Pain - Right/Left: Left Pain - part of body: Knee   Activity Tolerance Patient tolerated treatment well   Patient Left in chair;with call bell/phone within reach;with family/visitor present;with chair alarm set   Nurse Communication          Time: 7121-9758 OT Time Calculation (min): 22 min  Charges: OT General Charges $OT Visit: 1 Visit OT Treatments $Self Care/Home Management : 8-22 mins  Lesle Chris, OTR/L [phone] 11/10/2016   Liley Rake 11/10/2016, 1:48 PM

## 2016-11-11 LAB — CBC
HEMATOCRIT: 26.7 % — AB (ref 39.0–52.0)
HEMOGLOBIN: 9.5 g/dL — AB (ref 13.0–17.0)
MCH: 32.1 pg (ref 26.0–34.0)
MCHC: 35.6 g/dL (ref 30.0–36.0)
MCV: 90.2 fL (ref 78.0–100.0)
Platelets: 249 10*3/uL (ref 150–400)
RBC: 2.96 MIL/uL — ABNORMAL LOW (ref 4.22–5.81)
RDW: 13.1 % (ref 11.5–15.5)
WBC: 9.5 10*3/uL (ref 4.0–10.5)

## 2016-11-11 NOTE — Progress Notes (Signed)
Physical Therapy Treatment Patient Details Name: Randy Sullivan MRN: [id number] DOB: 1929-01-25 Today's Date: 11/11/2016    History of Present Illness Left TKA, right knee has issues with pain, buckling,locking up.    PT Comments    POD # 3 pm session With spouse present to observe and instructed on mobility safety.  Assisted with amb in hallway without KI so that pt could perform sit to stand and stand to sit more easily.  Practiced 2 steps twice to enter home.  25% VC's on proper sequencing and safety.  Performed better.  Spouse instructed how to assist.  Pt ready for D/C to home but do not rec he amb by himself and going to OP would be difficult.    Follow Up Recommendations  Supervision/Assistance - 24 hour     Equipment Recommendations  None recommended by PT    Recommendations for Other Services       Precautions / Restrictions Precautions Precautions: Fall;Knee Precaution Comments: POD # 3 did not use Required Braces or Orthoses: Knee Immobilizer - Left;Other Brace/Splint Knee Immobilizer - Left: Discontinue once straight leg raise with < 10 degree lag Restrictions Weight Bearing Restrictions: No Other Position/Activity Restrictions: WBAT     Mobility  Bed Mobility         Supine to sit: Min assist     General bed mobility comments: OOB in recliner  Transfers Overall transfer level: Needs assistance Equipment used: Rolling walker (2 wheeled) Transfers: Sit to/from Stand Sit to Stand: Min assist;Min guard         General transfer comment: 50% VC's on proper hand placement.  Posterior lean/LOB each time with poor self correction.   Ambulation/Gait Ambulation/Gait assistance: Min guard;Min assist Ambulation Distance (Feet): 22 Feet Assistive device: Rolling walker (2 wheeled) Gait Pattern/deviations: Step-to pattern;Shuffle;Festinating Gait velocity: decreased   General Gait Details: with spouse present pt amb needing MinGuard Assist for balance  instability esp with turns and backing up to chair.  Would not rec pt amb on his own.  Spouse stated, pt has had "balance problems for a while".    Stairs Stairs: Yes   Stair Management: One rail Right;Step to pattern;Forwards Number of Stairs: 2 General stair comments: 75% VC's on proper sequencing and safety   spouse present for education  Wheelchair Mobility    Modified Rankin (Stroke Patients Only)       Balance                                            Cognition Arousal/Alertness: Awake/alert Behavior During Therapy: WFL for tasks assessed/performed Overall Cognitive Status: Within Functional Limits for tasks assessed                                 General Comments: requires repeat instruction      Exercises      General Comments        Pertinent Vitals/Pain Pain Assessment: 0-10 Pain Score: 3  Pain Location: L knee Pain Descriptors / Indicators: Sore;Operative site guarding Pain Intervention(s): Monitored during session;Repositioned;Ice applied    Home Living                      Prior Function            PT Goals (current goals can  now be found in the care plan section) Progress towards PT goals: Progressing toward goals    Frequency    7X/week      PT Plan Current plan remains appropriate    Co-evaluation              AM-PAC PT "6 Clicks" Daily Activity  Outcome Measure  Difficulty turning over in bed (including adjusting bedclothes, sheets and blankets)?: A Lot Difficulty moving from lying on back to sitting on the side of the bed? : A Lot Difficulty sitting down on and standing up from a chair with arms (e.g., wheelchair, bedside commode, etc,.)?: A Lot Help needed moving to and from a bed to chair (including a wheelchair)?: A Lot Help needed walking in hospital room?: A Lot Help needed climbing 3-5 steps with a railing? : A Lot 6 Click Score: 12    End of Session Equipment Utilized  During Treatment: Gait belt Activity Tolerance: Patient tolerated treatment well Patient left: in chair;with chair alarm set;with call bell/phone within reach Nurse Communication: Mobility status PT Visit Diagnosis: Unsteadiness on feet (R26.81);Difficulty in walking, not elsewhere classified (R26.2);Pain Pain - Right/Left: Right Pain - part of body: Knee     Time: 1510-1537 PT Time Calculation (min) (ACUTE ONLY): 27 min  Charges:  $Gait Training: 8-22 mins $Therapeutic Activity: 8-22 mins                    G Codes:       Rica Koyanagi  PTA [facility]  Acute  Rehab Pager      [phone]

## 2016-11-11 NOTE — Clinical Social Work Note (Signed)
Clinical Social Work Assessment  Patient Details  Name: Randy Sullivan MRN: [id number] Date of Birth: March 04, 1928  Date of referral:  11/11/16               Reason for consult:  Facility Placement                Permission sought to share information with:  Family Supports Permission granted to share information::  Yes, Verbal Permission Granted  Name::     Randy Sullivan  Agency::     Relationship::  Spouse  Contact Information:     Housing/Transportation Living arrangements for the past 2 months:  Single Family Home Source of Information:  Patient Patient Interpreter Needed:  None Criminal Activity/Legal Involvement Pertinent to Current Situation/Hospitalization:  No - Comment as needed Significant Relationships:  None Lives with:  Spouse Do you feel safe going back to the place where you live?  Yes Need for family participation in patient care:  No  Care giving concerns:  SNF for short term rehab.   Social Worker assessment / plan:  CSW met with patient at bedside, explain role and reason for visit-assist with transition to SNF. Patient/ Spouse report patient has not progress enough to go home and feels SNF for short rehab is best option. Patient is agreeable to [facility]. CSW contacted liaison and sent FL2. She plans to follow up with CSW in the a.m.  Patient spouse also contacted facility and the [facility] to inquire about placement.  FL2 completed, will complete PASRR.   Plan: Assist with discharge to SNF.   Employment status:  Retired Forensic scientist:  [employer] PT Recommendations:  Research officer, trade union, [place] / Referral to community resources:  [place]  Patient/Family's Response to care: Agreeable to care and appreciative of CSW services.   Patient/Family's Understanding of and Emotional Response to Diagnosis, Current Treatment, and Prognosis:  " We think going home at this time is not the safest plan." Patient and  spouse understand patient is slowly progressing and will need further treatment at rehab center for a few days.   Emotional Assessment Appearance:  Appears stated age Attitude/Demeanor/Rapport:    Affect (typically observed):  Calm, Pleasant Orientation:  Oriented to Self, Oriented to Place, Oriented to  Time, Oriented to Situation Alcohol / Substance use:  Not Applicable Psych involvement (Current and /or in the community):  No (Comment)  Discharge Needs  Concerns to be addressed:  Discharge Planning Concerns Readmission within the last 30 days:  No Current discharge risk:  None Barriers to Discharge:  Continued Medical Work up   [employer], LCSW 11/11/2016, 4:40 PM

## 2016-11-11 NOTE — Progress Notes (Signed)
   Subjective: 3 Days Post-Op Procedure(s) (LRB): LEFT TOTAL KNEE ARTHROPLASTY (Left) Patient reports pain as mild.   Patient seen in rounds by Dr. Wynelle Link. Patient is well, but has had some minor complaints of pain in the knee, requiring pain medications Patient may be ready to go home later this afternoon after two sessions if meets all goals and doing well.  Objective: Vital signs in last 24 hours: Temp:  [98 F (36.7 C)-98.4 F (36.9 C)] 98.4 F (36.9 C) (09/20 0443) Pulse Rate:  [97-104] 100 (09/20 0443) Resp:  [16-17] 17 (09/20 0443) BP: (128-134)/(68-72) 134/72 (09/20 0443) SpO2:  [96 %-98 %] 97 % (09/20 0443)  Intake/Output from previous day:  Intake/Output Summary (Last 24 hours) at 11/11/16 0836 Last data filed at 11/11/16 0830  Gross per 24 hour  Intake             1180 ml  Output             1750 ml  Net             -570 ml    Intake/Output this shift: Total I/O In: 240 [P.O.:240] Out: 600 [Urine:600]  Labs:  Recent Labs  11/09/16 0604 11/10/16 0535 11/11/16 0530  HGB 10.2* 9.7* 9.5*    Recent Labs  11/10/16 0535 11/11/16 0530  WBC 11.0* 9.5  RBC 3.00* 2.96*  HCT 26.9* 26.7*  PLT 256 249    Recent Labs  11/09/16 0604 11/10/16 0535  NA 132* 132*  K 4.3 4.3  CL 103 102  CO2 22 23  BUN 22* 21*  CREATININE 1.20 1.07  GLUCOSE 124* 112*  CALCIUM 8.6* 8.8*   No results for input(s): LABPT, INR in the last 72 hours.  EXAM: General - Patient is Alert, Appropriate and Oriented Extremity - Neurovascular intact Sensation intact distally Intact pulses distally Incision - clean, dry, no drainage Motor Function - intact, moving foot and toes well on exam.   Assessment/Plan: 3 Days Post-Op Procedure(s) (LRB): LEFT TOTAL KNEE ARTHROPLASTY (Left) Procedure(s) (LRB): LEFT TOTAL KNEE ARTHROPLASTY (Left) Past Medical History:  Diagnosis Date  . BPH (benign prostatic hyperplasia)   . Cancer ([place])    non melanoma skin cancers removed  . DJD  (degenerative joint disease)   . Familial tremor   . GERD (gastroesophageal reflux disease)   . Heart murmur    hx of  . Hyperlipidemia   . Hypertension   . Hypothyroidism   . Mood disorder ([place])   . Osteoarthritis   . Prostatism   . Shortness of breath    mild with excertion  . Sleep apnea    cpap  . Thyroid disease    Principal Problem:   Osteoarthritis of knee Active Problems:   OA (osteoarthritis) of knee  Estimated body mass index is 24.95 kg/m as calculated from the following:   Height as of this encounter: 6' (1.829 m).   Weight as of this encounter: 83.5 kg (184 lb). Up with therapy Discharge home - outpatient therapy Diet - Cardiac diet Follow up - in 2 weeks Activity - WBAT Disposition - Home Condition Upon Discharge - pending therapy D/C Meds - See DC Summary DVT Prophylaxis - Xarelto  Arlee Muslim, PA-C Orthopaedic Surgery 11/11/2016, 8:36 AM

## 2016-11-11 NOTE — Progress Notes (Signed)
Physical Therapy Treatment Patient Details Name: Randy Sullivan MRN: [id number] DOB: [birth year] Today's Date: 11/11/2016    History of Present Illness Left TKA, right knee has issues with pain, buckling,locking up.    PT Comments    POD # 3 am session Pt progressing slowly with issues of balance instability and posterior LOB with sit to stand each time.  Assisted with amb a greater distance and practiced stairs.  Pt required extra VC's/instruction.  Returned to room then performed some TKR TE's followed by ICE.  Pt will need another PT session with spouse to address mobility safety and stairs.   Follow Up Recommendations  SNF;Supervision/Assistance - 24 hour     Equipment Recommendations  None recommended by PT    Recommendations for Other Services       Precautions / Restrictions Precautions Precautions: Fall;Knee Precaution Comments: POD # 3 did not use Required Braces or Orthoses: Knee Immobilizer - Left;Other Brace/Splint Knee Immobilizer - Left: Discontinue once straight leg raise with < 10 degree lag Restrictions Weight Bearing Restrictions: No Other Position/Activity Restrictions: WBAT     Mobility  Bed Mobility         Supine to sit: Min assist     General bed mobility comments: OOB in recliner  Transfers Overall transfer level: Needs assistance Equipment used: Rolling walker (2 wheeled) Transfers: Sit to/from Stand Sit to Stand: Min assist;Min guard         General transfer comment: 50% VC's on proper hand placement.  Posterior lean/LOB each time with poor self correction.   Ambulation/Gait Ambulation/Gait assistance: Min guard;Min assist Ambulation Distance (Feet): 45 Feet Assistive device: Rolling walker (2 wheeled) Gait Pattern/deviations: Step-to pattern;Shuffle;Festinating Gait velocity: decreased   General Gait Details: still unsteady requiring assist to esp with turns and initial rise.  HIGH FALL RISK  Delayed corrective  reaction   Stairs Stairs: Yes   Stair Management: Two rails;Step to pattern;Forwards Number of Stairs: 2 General stair comments: 75% VC's on proper sequencing and safety  Wheelchair Mobility    Modified Rankin (Stroke Patients Only)       Balance                                            Cognition Arousal/Alertness: Awake/alert Behavior During Therapy: WFL for tasks assessed/performed Overall Cognitive Status: Within Functional Limits for tasks assessed                                 General Comments: requires repeat instruction      Exercises   Total Knee Replacement TE's 10 reps B LE ankle pumps 10 reps towel squeezes 10 reps knee presses 10 reps heel slides  10 reps SAQ's 10 reps SLR's 10 reps ABD Followed by ICE    General Comments        Pertinent Vitals/Pain Pain Assessment: 0-10 Pain Score: 3  Pain Location: L knee Pain Descriptors / Indicators: Sore;Operative site guarding Pain Intervention(s): Monitored during session;Repositioned;Ice applied    Home Living                      Prior Function            PT Goals (current goals can now be found in the care plan section) Progress towards PT goals: Progressing toward  goals    Frequency    7X/week      PT Plan Current plan remains appropriate    Co-evaluation              AM-PAC PT "6 Clicks" Daily Activity  Outcome Measure  Difficulty turning over in bed (including adjusting bedclothes, sheets and blankets)?: A Lot Difficulty moving from lying on back to sitting on the side of the bed? : A Lot Difficulty sitting down on and standing up from a chair with arms (e.g., wheelchair, bedside commode, etc,.)?: A Lot Help needed moving to and from a bed to chair (including a wheelchair)?: A Lot Help needed walking in hospital room?: A Lot Help needed climbing 3-5 steps with a railing? : A Lot 6 Click Score: 12    End of Session  Equipment Utilized During Treatment: Gait belt Activity Tolerance: Patient tolerated treatment well Patient left: in chair;with chair alarm set;with call bell/phone within reach Nurse Communication: Mobility status PT Visit Diagnosis: Unsteadiness on feet (R26.81);Difficulty in walking, not elsewhere classified (R26.2);Pain Pain - Right/Left: Right Pain - part of body: Knee     Time: 1194-1740 PT Time Calculation (min) (ACUTE ONLY): 32 min  Charges:  $Gait Training: 8-22 mins $Therapeutic Activity: 8-22 mins                    G Codes:       Rica Koyanagi  PTA [facility]  Acute  Rehab Pager      [phone]

## 2016-11-11 NOTE — NC FL2 (Signed)
[place] LEVEL OF CARE SCREENING TOOL     IDENTIFICATION  Patient Name: Randy Sullivan Birthdate: 1928-12-11 Sex: male Admission Date (Current Location): 11/08/2016  [facility] and [place] Number:  Herbalist and Address:  [facility],  [place] [address], [place]      Provider Number: [id number]  Attending Physician Name and Address:  Gaynelle Arabian, MD  Relative Name and Phone Number:       Current Level of Care: Hospital Recommended Level of Care: [place] Prior Approval Number:    Date Approved/Denied:   PASRR Number:    Discharge Plan: SNF    Current Diagnoses: Patient Active Problem List   Diagnosis Date Noted  . OA (osteoarthritis) of knee 11/08/2016  . Essential hypertension 02/05/2016  . Thoracic aortic atherosclerosis ([place]) 02/05/2016  . Benign familial tremor 02/05/2016  . COPD (chronic obstructive pulmonary disease) ([place]) 01/21/2016  . OSA (obstructive sleep apnea) 10/23/2015  . Upper airway cough syndrome 10/23/2015  . Exertional dyspnea 10/23/2015  . RLS (restless legs syndrome) 10/23/2015  . Osteoarthritis of knee 06/04/2010  . Dizziness 06/04/2010  . Benign hypertensive heart disease without heart failure 06/04/2010  . Hypercholesterolemia 06/04/2010  . BPH (benign prostatic hyperplasia) 06/04/2010    Orientation RESPIRATION BLADDER Height & Weight     Self, Time, Situation, Place  Normal Continent Weight: 184 lb (83.5 kg) Height:  6' (182.9 cm)  BEHAVIORAL SYMPTOMS/MOOD NEUROLOGICAL BOWEL NUTRITION STATUS      Continent Diet (Low Sodium Heart Healthy )  AMBULATORY STATUS COMMUNICATION OF NEEDS Skin   Limited Assist Verbally Surgical wounds (Incision-Left Knee)                       Personal Care Assistance Level of Assistance  Bathing, Feeding, Dressing Bathing Assistance: Limited assistance Feeding assistance: Independent Dressing Assistance: Limited assistance      Functional Limitations Info  Sight, Hearing, Speech Sight Info: Adequate Hearing Info: Adequate Speech Info: Adequate    SPECIAL CARE FACTORS FREQUENCY  PT (By licensed PT), OT (By licensed OT)     PT Frequency: 7x/week OT Frequency: 7x/week            Contractures Contractures Info: Not present    Additional Factors Info  Code Status, Allergies Code Status Info: FULLCODE Allergies Info: No Known Allergies           Current Medications (11/11/2016):  This is the current hospital active medication list Current Facility-Administered Medications  Medication Dose Route Frequency Provider Last Rate Last Dose  . acetaminophen (TYLENOL) tablet 650 mg  650 mg Oral Q6H PRN Gaynelle Arabian, MD   650 mg at 11/11/16 8676   Or  . acetaminophen (TYLENOL) suppository 650 mg  650 mg Rectal Q6H PRN Gaynelle Arabian, MD      . albuterol (PROVENTIL) (2.5 MG/3ML) 0.083% nebulizer solution 2.5 mg  2.5 mg Inhalation Q4H PRN Gaynelle Arabian, MD      . artificial tears (LACRILUBE) ophthalmic ointment   Both Eyes Daily Aluisio, Pilar Plate, MD      . bisacodyl (DULCOLAX) suppository 10 mg  10 mg Rectal Daily PRN Gaynelle Arabian, MD      . darifenacin (ENABLEX) 24 hr tablet 7.5 mg  7.5 mg Oral QPM Gaynelle Arabian, MD   7.5 mg at 11/10/16 1708  . diphenhydrAMINE (BENADRYL) 12.5 MG/5ML elixir 12.5-25 mg  12.5-25 mg Oral Q4H PRN Gaynelle Arabian, MD      . docusate  sodium (COLACE) capsule 100 mg  100 mg Oral BID Gaynelle Arabian, MD   100 mg at 11/10/16 2137  . fluticasone (FLONASE) 50 MCG/ACT nasal spray 1 spray  1 spray Each Nare Daily PRN Aluisio, Pilar Plate, MD      . fluticasone furoate-vilanterol (BREO ELLIPTA) 100-25 MCG/INH 1 puff  1 puff Inhalation QHS Gaynelle Arabian, MD   1 puff at 11/10/16 1926  . gabapentin (NEURONTIN) capsule 300 mg  300 mg Oral QHS Gaynelle Arabian, MD   300 mg at 11/10/16 2137  . levothyroxine (SYNTHROID, LEVOTHROID) tablet 112 mcg  112 mcg Oral QAC breakfast Gaynelle Arabian, MD   112 mcg at  11/11/16 1761  . loratadine (CLARITIN) tablet 10 mg  10 mg Oral Daily Gaynelle Arabian, MD   10 mg at 11/11/16 1229  . megestrol (MEGACE) tablet 40 mg  40 mg Oral QAC breakfast Gaynelle Arabian, MD   40 mg at 11/11/16 6073  . menthol-cetylpyridinium (CEPACOL) lozenge 3 mg  1 lozenge Oral PRN Aluisio, Pilar Plate, MD       Or  . phenol (CHLORASEPTIC) mouth spray 1 spray  1 spray Mouth/Throat PRN Aluisio, Pilar Plate, MD      . methocarbamol (ROBAXIN) tablet 500 mg  500 mg Oral Q6H PRN Gaynelle Arabian, MD   500 mg at 11/10/16 0511   Or  . methocarbamol (ROBAXIN) 500 mg in dextrose 5 % 50 mL IVPB  500 mg Intravenous Q6H PRN Aluisio, Pilar Plate, MD      . metoCLOPramide (REGLAN) tablet 5-10 mg  5-10 mg Oral Q8H PRN Gaynelle Arabian, MD       Or  . metoCLOPramide (REGLAN) injection 5-10 mg  5-10 mg Intravenous Q8H PRN Aluisio, Pilar Plate, MD      . mirabegron ER (MYRBETRIQ) tablet 50 mg  50 mg Oral Daily Gaynelle Arabian, MD   50 mg at 11/11/16 1230  . montelukast (SINGULAIR) tablet 10 mg  10 mg Oral QHS Gaynelle Arabian, MD   10 mg at 11/10/16 2137  . morphine 4 MG/ML injection 1 mg  1 mg Intravenous Q2H PRN Aluisio, Pilar Plate, MD      . omeprazole (PRILOSEC) capsule 20 mg  20 mg Oral Daily Perkins, Alexzandrew L, PA-C   20 mg at 11/11/16 1230  . ondansetron (ZOFRAN) tablet 4 mg  4 mg Oral Q6H PRN Gaynelle Arabian, MD       Or  . ondansetron (ZOFRAN) injection 4 mg  4 mg Intravenous Q6H PRN Aluisio, Pilar Plate, MD      . oxyCODONE (Oxy IR/ROXICODONE) immediate release tablet 5-10 mg  5-10 mg Oral Q3H PRN Gaynelle Arabian, MD   5 mg at 11/11/16 7106  . polyethylene glycol (MIRALAX / GLYCOLAX) packet 17 g  17 g Oral Daily PRN Aluisio, Pilar Plate, MD      . propranolol (INDERAL) tablet 20 mg  20 mg Oral Daily Gaynelle Arabian, MD   20 mg at 11/11/16 1230  . rivaroxaban (XARELTO) tablet 10 mg  10 mg Oral Q breakfast Gaynelle Arabian, MD   10 mg at 11/11/16 2694  . rosuvastatin (CRESTOR) tablet 5 mg  5 mg Oral QHS Gaynelle Arabian, MD   5 mg at 11/10/16 2137   . sodium phosphate (FLEET) 7-19 GM/118ML enema 1 enema  1 enema Rectal Once PRN Aluisio, Pilar Plate, MD      . traMADol Veatrice Bourbon) tablet 50-100 mg  50-100 mg Oral Q6H PRN Gaynelle Arabian, MD   100 mg at 11/09/16 0336     Discharge Medications: Please  see discharge summary for a list of discharge medications.  Relevant Imaging Results:  Relevant Lab Results:   Additional Information ORV:[id number]  Lia Hopping, LCSW

## 2016-11-11 NOTE — Care Management Important Message (Signed)
Important Message  Patient Details  Name: JOEANGEL JEANPAUL MRN: [id number] Date of Birth: [birth year]   Medicare Important Message Given:  Yes    Kerin Salen 11/11/2016, 10:17 AMImportant Message  Patient Details  Name: BLAKE VETRANO MRN: [id number] Date of Birth: 1928/11/17   Medicare Important Message Given:  Yes    Kerin Salen 11/11/2016, 10:17 AM

## 2016-11-11 NOTE — Progress Notes (Signed)
Occupational Therapy Treatment Patient Details Name: Randy Sullivan MRN: [id number] DOB: 10-02-1928 Today's Date: 11/11/2016    History of present illness Left TKA, right knee has issues with pain, buckling,locking up.   OT comments  Pt improving.  He continues to need cues for UE/LE placement and safety.    Follow Up Recommendations  Supervision/Assistance - 24 hour;DC plan and follow up therapy as arranged by surgeon    Equipment Recommendations  None recommended by OT    Recommendations for Other Services      Precautions / Restrictions Precautions Precautions: Fall;Knee Required Braces or Orthoses: Knee Immobilizer - Left;Other Brace/Splint Knee Immobilizer - Left: Discontinue once straight leg raise with < 10 degree lag Restrictions Other Position/Activity Restrictions: WBAT        Mobility Bed Mobility         Supine to sit: Min assist     General bed mobility comments: from flat bed without rails with cues for sequence  Transfers   Equipment used: Rolling walker (2 wheeled)   Sit to Stand: Min guard;From elevated surface         General transfer comment: from bed; cues for UE/LE placement    Balance                                           ADL either performed or assessed with clinical judgement   ADL       Grooming: Oral care;Supervision/safety;Standing (and shaving with electric razor).  Pt has tremor, but able to perform grooming including opening all containers and removing electric razor from case                                 General ADL Comments: Pt had just used commode with NT.  Practiced getting out of bed from flat bed; his HOB at home does raise     Vision       Perception     Praxis      Cognition Arousal/Alertness: Awake/alert Behavior During Therapy: WFL for tasks assessed/performed Overall Cognitive Status: No family/caregiver present to determine baseline cognitive functioning                                  General Comments: pt talks a lot but recalls where he is in the conversation; however, he does need cues for LLE during sit to stand and sequencing/safety        Exercises     Shoulder Instructions       General Comments      Pertinent Vitals/ Pain       Pain Score: 2  Pain Location: L knee Pain Descriptors / Indicators: Sore Pain Intervention(s): Limited activity within patient's tolerance;Monitored during session;Premedicated before session;Repositioned;Ice applied  Home Living                                          Prior Functioning/Environment              Frequency           Progress Toward Goals  OT Goals(current goals can now be found in the care plan section)  Progress towards OT goals: Progressing toward goals     Plan      Co-evaluation                 AM-PAC PT "6 Clicks" Daily Activity     Outcome Measure   Help from another person eating meals?: None Help from another person taking care of personal grooming?: A Little Help from another person toileting, which includes using toliet, bedpan, or urinal?: A Little Help from another person bathing (including washing, rinsing, drying)?: A Lot Help from another person to put on and taking off regular upper body clothing?: A Little Help from another person to put on and taking off regular lower body clothing?: A Lot 6 Click Score: 17    End of Session    OT Visit Diagnosis: Pain Pain - Right/Left: Left Pain - part of body: Knee   Activity Tolerance Patient tolerated treatment well   Patient Left in chair;with call bell/phone within reach;with chair alarm set   Nurse Communication          Time: 6073-7106 OT Time Calculation (min): 26 min  Charges: OT General Charges $OT Visit: 1 Visit OT Treatments $Self Care/Home Management : 23-37 mins  Lesle Chris,  OTR/L [phone] 11/11/2016   [place] 11/11/2016, 12:10 PM

## 2016-11-12 DIAGNOSIS — M1712 Unilateral primary osteoarthritis, left knee: Secondary | ICD-10-CM | POA: Diagnosis not present

## 2016-11-12 DIAGNOSIS — L03116 Cellulitis of left lower limb: Secondary | ICD-10-CM | POA: Diagnosis not present

## 2016-11-12 DIAGNOSIS — E038 Other specified hypothyroidism: Secondary | ICD-10-CM | POA: Diagnosis not present

## 2016-11-12 DIAGNOSIS — N401 Enlarged prostate with lower urinary tract symptoms: Secondary | ICD-10-CM | POA: Diagnosis not present

## 2016-11-12 DIAGNOSIS — R262 Difficulty in walking, not elsewhere classified: Secondary | ICD-10-CM | POA: Diagnosis not present

## 2016-11-12 DIAGNOSIS — R278 Other lack of coordination: Secondary | ICD-10-CM | POA: Diagnosis not present

## 2016-11-12 DIAGNOSIS — I10 Essential (primary) hypertension: Secondary | ICD-10-CM | POA: Diagnosis not present

## 2016-11-12 DIAGNOSIS — N3281 Overactive bladder: Secondary | ICD-10-CM | POA: Diagnosis not present

## 2016-11-12 DIAGNOSIS — M6281 Muscle weakness (generalized): Secondary | ICD-10-CM | POA: Diagnosis not present

## 2016-11-12 DIAGNOSIS — M25562 Pain in left knee: Secondary | ICD-10-CM | POA: Diagnosis not present

## 2016-11-12 DIAGNOSIS — Z96652 Presence of left artificial knee joint: Secondary | ICD-10-CM | POA: Diagnosis not present

## 2016-11-12 DIAGNOSIS — Z471 Aftercare following joint replacement surgery: Secondary | ICD-10-CM | POA: Diagnosis not present

## 2016-11-12 DIAGNOSIS — D6489 Other specified anemias: Secondary | ICD-10-CM | POA: Diagnosis not present

## 2016-11-12 DIAGNOSIS — M1711 Unilateral primary osteoarthritis, right knee: Secondary | ICD-10-CM | POA: Diagnosis not present

## 2016-11-12 DIAGNOSIS — R509 Fever, unspecified: Secondary | ICD-10-CM | POA: Diagnosis not present

## 2016-11-12 MED ORDER — FLEET ENEMA 7-19 GM/118ML RE ENEM: 1.0000 | ENEMA | Freq: Once | RECTAL | 0 refills | Status: DC | PRN

## 2016-11-12 MED ORDER — ARTIFICIAL TEARS OPHTHALMIC OINT: TOPICAL_OINTMENT | Freq: Every day | OPHTHALMIC | 0 refills | Status: DC

## 2016-11-12 MED ORDER — BISACODYL 10 MG RE SUPP: 10.0000 mg | Freq: Every day | RECTAL | 0 refills | Status: DC | PRN

## 2016-11-12 MED ORDER — OXYCODONE HCL 5 MG PO TABS: 5.0000 mg | ORAL_TABLET | ORAL | 0 refills | Status: DC | PRN

## 2016-11-12 MED ORDER — RIVAROXABAN 10 MG PO TABS: 10.0000 mg | ORAL_TABLET | Freq: Every day | ORAL | 0 refills | Status: DC

## 2016-11-12 MED ORDER — TRAMADOL HCL 50 MG PO TABS: 50.0000 mg | ORAL_TABLET | Freq: Four times a day (QID) | ORAL | 0 refills | Status: DC | PRN

## 2016-11-12 MED ORDER — DIPHENHYDRAMINE HCL 12.5 MG/5ML PO ELIX: 12.5000 mg | ORAL_SOLUTION | ORAL | 0 refills | Status: DC | PRN

## 2016-11-12 MED ORDER — TIZANIDINE HCL 4 MG PO TABS: 4.0000 mg | ORAL_TABLET | Freq: Three times a day (TID) | ORAL | 0 refills | Status: DC

## 2016-11-12 MED ORDER — DOCUSATE SODIUM 100 MG PO CAPS: 100.0000 mg | ORAL_CAPSULE | Freq: Two times a day (BID) | ORAL | 0 refills | Status: DC

## 2016-11-12 MED ORDER — ACETAMINOPHEN 325 MG PO TABS: 650.0000 mg | ORAL_TABLET | Freq: Four times a day (QID) | ORAL | 0 refills | Status: DC | PRN

## 2016-11-12 NOTE — Progress Notes (Signed)
Physical Therapy Treatment Patient Details Name: Randy Sullivan MRN: [id number] DOB: [birth year] Today's Date: 11/12/2016    History of Present Illness Left TKA, right knee has issues with pain, buckling,locking up.    PT Comments    POD # 4 am session Assisted OOB to amb to bathroom then a limited distance in hallway.  Pt progressing slowly and still present with poor balance and gait instability.  Pt unable to safely return home with spouse plans to D/C to SNF for ST Rehab.    Follow Up Recommendations  SNF;Supervision/Assistance - 24 hour ([facility])     Equipment Recommendations       Recommendations for Other Services       Precautions / Restrictions Precautions Precautions: Fall;Knee Precaution Comments: POD # 4 did not use KI Restrictions Weight Bearing Restrictions: No Other Position/Activity Restrictions: WBAT     Mobility  Bed Mobility Overal bed mobility: Needs Assistance Bed Mobility: Supine to Sit     Supine to sit: Min assist     General bed mobility comments: increased time and VC's  Transfers Overall transfer level: Needs assistance Equipment used: Rolling walker (2 wheeled) Transfers: Sit to/from Stand Sit to Stand: Min assist;Min guard         General transfer comment: 50% VC's on proper hand placement.  Posterior lean/LOB each time with poor self correction.   Ambulation/Gait Ambulation/Gait assistance: Min guard;Min assist Ambulation Distance (Feet): 28 Feet Assistive device: Rolling walker (2 wheeled) Gait Pattern/deviations: Step-to pattern;Shuffle;Festinating Gait velocity: decreased   General Gait Details: assisted with amb to bathroom then a limited distance in hallway.  progressing but still present with poor balance and instability.  Pt states his "other" knee is "bad".    Stairs            Wheelchair Mobility    Modified Rankin (Stroke Patients Only)       Balance                                            Cognition Arousal/Alertness: Awake/alert Behavior During Therapy: WFL for tasks assessed/performed Overall Cognitive Status: Within Functional Limits for tasks assessed                                        Exercises      General Comments        Pertinent Vitals/Pain Pain Assessment: 0-10 Pain Score: 4  Pain Location: L knee Pain Descriptors / Indicators: Sore;Operative site guarding Pain Intervention(s): Monitored during session;Repositioned;Ice applied    Home Living                      Prior Function            PT Goals (current goals can now be found in the care plan section) Progress towards PT goals: Progressing toward goals    Frequency    7X/week      PT Plan      Co-evaluation              AM-PAC PT "6 Clicks" Daily Activity  Outcome Measure  Difficulty turning over in bed (including adjusting bedclothes, sheets and blankets)?: A Lot Difficulty moving from lying on back to sitting on the side of the bed? :  A Lot Difficulty sitting down on and standing up from a chair with arms (e.g., wheelchair, bedside commode, etc,.)?: A Lot   Help needed walking in hospital room?: A Lot Help needed climbing 3-5 steps with a railing? : A Lot 6 Click Score: 10    End of Session Equipment Utilized During Treatment: Gait belt Activity Tolerance: Patient tolerated treatment well Patient left: in chair;with chair alarm set;with call bell/phone within reach Nurse Communication: Mobility status PT Visit Diagnosis: Unsteadiness on feet (R26.81);Difficulty in walking, not elsewhere classified (R26.2);Pain Pain - Right/Left: Right Pain - part of body: Knee     Time: 0940-1005 PT Time Calculation (min) (ACUTE ONLY): 25 min  Charges:  $Gait Training: 8-22 mins $Therapeutic Activity: 8-22 mins                    G Codes:       Rica Koyanagi  PTA [facility]  Acute  Rehab Pager      [phone]

## 2016-11-12 NOTE — Progress Notes (Signed)
Plan for d/c to SNF, discharge planning per CSW. [phone] 

## 2016-11-12 NOTE — Progress Notes (Signed)
   Subjective: 4 Days Post-Op Procedure(s) (LRB): LEFT TOTAL KNEE ARTHROPLASTY (Left) Patient reports pain as mild.   Patient seen in rounds with Dr. Wynelle Link. Slow progression so will plan on a few days at a SNF - [facility] Patient is well, and has had no acute complaints or problems Patient is ready to go to the SNF later today.  Will setup DC summary for transfer.  Objective: Vital signs in last 24 hours: Temp:  [98.2 F (36.8 C)-98.3 F (36.8 C)] 98.3 F (36.8 C) (09/21 0505) Pulse Rate:  [88-96] 94 (09/21 0801) Resp:  [16] 16 (09/21 0505) BP: (106-134)/(72-78) 132/76 (09/21 0801) SpO2:  [81 %-100 %] 100 % (09/21 0505)  Intake/Output from previous day:  Intake/Output Summary (Last 24 hours) at 11/12/16 0835 Last data filed at 11/12/16 0505  Gross per 24 hour  Intake              740 ml  Output              750 ml  Net              -10 ml    Intake/Output this shift: No intake/output data recorded.  Labs:  Recent Labs  11/10/16 0535 11/11/16 0530  HGB 9.7* 9.5*    Recent Labs  11/10/16 0535 11/11/16 0530  WBC 11.0* 9.5  RBC 3.00* 2.96*  HCT 26.9* 26.7*  PLT 256 249    Recent Labs  11/10/16 0535  NA 132*  K 4.3  CL 102  CO2 23  BUN 21*  CREATININE 1.07  GLUCOSE 112*  CALCIUM 8.8*   No results for input(s): LABPT, INR in the last 72 hours.  EXAM: General - Patient is Alert, Appropriate and Oriented Extremity - Neurovascular intact Sensation intact distally Intact pulses distally Dorsiflexion/Plantar flexion intact Incision - clean, dry, no drainage Motor Function - intact, moving foot and toes well on exam.   Assessment/Plan: 4 Days Post-Op Procedure(s) (LRB): LEFT TOTAL KNEE ARTHROPLASTY (Left) Procedure(s) (LRB): LEFT TOTAL KNEE ARTHROPLASTY (Left) Past Medical History:  Diagnosis Date  . BPH (benign prostatic hyperplasia)   . Cancer ([place])    non melanoma skin cancers removed  . DJD (degenerative joint disease)   . Familial  tremor   . GERD (gastroesophageal reflux disease)   . Heart murmur    hx of  . Hyperlipidemia   . Hypertension   . Hypothyroidism   . Mood disorder ([place])   . Osteoarthritis   . Prostatism   . Shortness of breath    mild with excertion  . Sleep apnea    cpap  . Thyroid disease    Principal Problem:   Osteoarthritis of knee Active Problems:   OA (osteoarthritis) of knee  Estimated body mass index is 24.95 kg/m as calculated from the following:   Height as of this encounter: 6' (1.829 m).   Weight as of this encounter: 83.5 kg (184 lb). Up with therapy Discharge to SNF Diet - Cardiac diet Follow up - in 2 weeks Activity - WBAT Disposition - Skilled nursing facility Condition Upon Discharge - Stable D/C Meds - See DC Summary DVT Prophylaxis - [employer], PA-C Orthopaedic Surgery 11/12/2016, 8:35 AM

## 2016-11-12 NOTE — Clinical Social Work Placement (Signed)
   CLINICAL SOCIAL WORK PLACEMENT  NOTE  Date:  11/12/2016  Patient Details  Name: Randy Sullivan MRN: [id number] Date of Birth: 1928/07/09  Clinical Social Work is seeking post-discharge placement for this patient at the [place] level of care (*CSW will initial, date and re-position this form in  chart as items are completed):  Yes   Patient/family provided with [facility] Work Department's list of facilities offering this level of care within the geographic area requested by the patient (or if unable, by the patient's family).  Yes   Patient/family informed of their freedom to choose among providers that offer the needed level of care, that participate in Medicare, Medicaid or managed care program needed by the patient, have an available bed and are willing to accept the patient.  Yes   Patient/family informed of [facility]'s ownership interest in [facility] and [facility], as well as of the fact that they are under no obligation to receive care at these facilities.  PASRR submitted to EDS on 11/11/16     PASRR number received on 11/11/16     Existing PASRR number confirmed on       FL2 transmitted to all facilities in geographic area requested by pt/family on 11/11/16     FL2 transmitted to all facilities within larger geographic area on       Patient informed that his/her managed care company has contracts with or will negotiate with certain facilities, including the following:        Yes   Patient/family informed of bed offers received.  Patient chooses bed at [place] [place]     Physician recommends and patient chooses bed at      Patient to be transferred to [facility] [place] on 11/12/16.  Patient to be transferred to facility by [facility]     Patient family notified on 11/12/16 of transfer.  Name of family member notified:  Spouse     PHYSICIAN       Additional Comment: Pt / spouse are in agreement with dc to  [facility] today. PT approved transport by car. DC Summary sent to SNF for review. Scripts included in [employer]. # for report provided to nsg. DC packet provided to pt /spouse.   _______________________________________________ Luretha Rued, [place]  [phone] 11/12/2016, 10:28 AM

## 2016-11-12 NOTE — Progress Notes (Signed)
Called report to Earnstine Regal at [facility]. Patient dressed and IV removed. Patients wife driving patient to [place] at discharge.

## 2016-11-14 DIAGNOSIS — N401 Enlarged prostate with lower urinary tract symptoms: Secondary | ICD-10-CM | POA: Diagnosis not present

## 2016-11-14 DIAGNOSIS — Z96652 Presence of left artificial knee joint: Secondary | ICD-10-CM | POA: Diagnosis not present

## 2016-11-14 DIAGNOSIS — D6489 Other specified anemias: Secondary | ICD-10-CM | POA: Diagnosis not present

## 2016-11-14 DIAGNOSIS — E038 Other specified hypothyroidism: Secondary | ICD-10-CM | POA: Diagnosis not present

## 2016-11-14 DIAGNOSIS — N3281 Overactive bladder: Secondary | ICD-10-CM | POA: Diagnosis not present

## 2016-11-14 DIAGNOSIS — I10 Essential (primary) hypertension: Secondary | ICD-10-CM | POA: Diagnosis not present

## 2016-11-14 DIAGNOSIS — L03116 Cellulitis of left lower limb: Secondary | ICD-10-CM | POA: Diagnosis not present

## 2016-11-14 DIAGNOSIS — M1711 Unilateral primary osteoarthritis, right knee: Secondary | ICD-10-CM | POA: Diagnosis not present

## 2016-11-16 DIAGNOSIS — M1712 Unilateral primary osteoarthritis, left knee: Secondary | ICD-10-CM | POA: Diagnosis not present

## 2016-11-17 ENCOUNTER — Other Ambulatory Visit: Payer: Self-pay | Admitting: *Deleted

## 2016-11-17 NOTE — Patient Outreach (Signed)
[facility] [facility]) Care Management  11/16/16  [name] 1928-06-16 [id number]   Met with Randy Sullivan, SW at facility 11/16/16.  She reports patient at facility under ortho bundle, he will discharge home. Good discharge plan. No [facility] care management needs addressed.  Plan to sign off. Randy Sullivan. Randy Purser, RN, BSN, [place] [phone]) Business Cell  [phone]) Toll Free Office

## 2016-11-18 DIAGNOSIS — E784 Other hyperlipidemia: Secondary | ICD-10-CM | POA: Diagnosis not present

## 2016-11-19 DIAGNOSIS — M25562 Pain in left knee: Secondary | ICD-10-CM | POA: Diagnosis not present

## 2016-11-21 ENCOUNTER — Emergency Department (HOSPITAL_COMMUNITY)
Admission: EM | Admit: 2016-11-21 | Discharge: 2016-11-22 | Disposition: A | Discharge status: Home / Self Care | Payer: MEDICARE | Attending: Emergency Medicine | Admitting: Emergency Medicine

## 2016-11-21 ENCOUNTER — Emergency Department (HOSPITAL_COMMUNITY): Payer: MEDICARE

## 2016-11-21 DIAGNOSIS — Z85828 Personal history of other malignant neoplasm of skin: Secondary | ICD-10-CM | POA: Insufficient documentation

## 2016-11-21 DIAGNOSIS — R9431 Abnormal electrocardiogram [ECG] [EKG]: Secondary | ICD-10-CM | POA: Diagnosis not present

## 2016-11-21 DIAGNOSIS — Z79899 Other long term (current) drug therapy: Secondary | ICD-10-CM | POA: Diagnosis not present

## 2016-11-21 DIAGNOSIS — E860 Dehydration: Secondary | ICD-10-CM | POA: Insufficient documentation

## 2016-11-21 DIAGNOSIS — R4020 Unspecified coma: Secondary | ICD-10-CM | POA: Diagnosis not present

## 2016-11-21 DIAGNOSIS — I10 Essential (primary) hypertension: Secondary | ICD-10-CM | POA: Diagnosis not present

## 2016-11-21 DIAGNOSIS — Z96652 Presence of left artificial knee joint: Secondary | ICD-10-CM | POA: Insufficient documentation

## 2016-11-21 DIAGNOSIS — E039 Hypothyroidism, unspecified: Secondary | ICD-10-CM | POA: Insufficient documentation

## 2016-11-21 DIAGNOSIS — M25462 Effusion, left knee: Secondary | ICD-10-CM | POA: Insufficient documentation

## 2016-11-21 DIAGNOSIS — Z87891 Personal history of nicotine dependence: Secondary | ICD-10-CM | POA: Insufficient documentation

## 2016-11-21 DIAGNOSIS — R4182 Altered mental status, unspecified: Secondary | ICD-10-CM | POA: Diagnosis not present

## 2016-11-21 DIAGNOSIS — Z7901 Long term (current) use of anticoagulants: Secondary | ICD-10-CM | POA: Diagnosis not present

## 2016-11-21 DIAGNOSIS — M25562 Pain in left knee: Secondary | ICD-10-CM | POA: Diagnosis not present

## 2016-11-21 LAB — URINALYSIS, ROUTINE W REFLEX MICROSCOPIC
Bacteria, UA: NONE SEEN
Bilirubin Urine: NEGATIVE
GLUCOSE, UA: NEGATIVE mg/dL
HGB URINE DIPSTICK: NEGATIVE
Ketones, ur: NEGATIVE mg/dL
Leukocytes, UA: NEGATIVE
NITRITE: NEGATIVE
PH: 5 (ref 5.0–8.0)
PROTEIN: 30 mg/dL — AB
SPECIFIC GRAVITY, URINE: 1.019 (ref 1.005–1.030)
Squamous Epithelial / LPF: NONE SEEN

## 2016-11-21 LAB — COMPREHENSIVE METABOLIC PANEL
ALT: 28 U/L (ref 17–63)
AST: 31 U/L (ref 15–41)
Albumin: 3.2 g/dL — ABNORMAL LOW (ref 3.5–5.0)
Alkaline Phosphatase: 100 U/L (ref 38–126)
Anion gap: 9 (ref 5–15)
BILIRUBIN TOTAL: 0.8 mg/dL (ref 0.3–1.2)
BUN: 26 mg/dL — AB (ref 6–20)
CHLORIDE: 102 mmol/L (ref 101–111)
CO2: 22 mmol/L (ref 22–32)
CREATININE: 1.11 mg/dL (ref 0.61–1.24)
Calcium: 9.5 mg/dL (ref 8.9–10.3)
GFR, EST NON AFRICAN AMERICAN: 58 mL/min — AB (ref >60)
Glucose, Bld: 105 mg/dL — ABNORMAL HIGH (ref 65–99)
POTASSIUM: 4.1 mmol/L (ref 3.5–5.1)
Sodium: 133 mmol/L — ABNORMAL LOW (ref 135–145)
TOTAL PROTEIN: 7 g/dL (ref 6.5–8.1)

## 2016-11-21 LAB — CBC
HCT: 25.8 % — ABNORMAL LOW (ref 39.0–52.0)
Hemoglobin: 8.6 g/dL — ABNORMAL LOW (ref 13.0–17.0)
MCH: 30.4 pg (ref 26.0–34.0)
MCHC: 33.3 g/dL (ref 30.0–36.0)
MCV: 91.2 fL (ref 78.0–100.0)
PLATELETS: 479 10*3/uL — AB (ref 150–400)
RBC: 2.83 MIL/uL — AB (ref 4.22–5.81)
RDW: 13.4 % (ref 11.5–15.5)
WBC: 12 10*3/uL — AB (ref 4.0–10.5)

## 2016-11-21 LAB — CBG MONITORING, ED: GLUCOSE-CAPILLARY: 104 mg/dL — AB (ref 65–99)

## 2016-11-21 LAB — I-STAT CG4 LACTIC ACID, ED: LACTIC ACID, VENOUS: 0.67 mmol/L (ref 0.5–1.9)

## 2016-11-21 MED ORDER — SODIUM CHLORIDE 0.9 % IV BOLUS (SEPSIS)
1000.0000 mL | Freq: Once | INTRAVENOUS | Status: AC
  Administered 2016-11-21: 1000 mL via INTRAVENOUS

## 2016-11-21 NOTE — ED Triage Notes (Addendum)
Patient BIB wife, reports "things just aren't going together like they should." Wife reports "he didn't want to get up this morning." Patient c/o generalized weakness. Left knee replacement x2 weeks ago. On doxycycline for cellulitis at the incision. Denies chest pain, SOB, abdominal pain, N/V/D, headache, and blurred vision.

## 2016-11-21 NOTE — ED Notes (Signed)
Pt transported to XR.  

## 2016-11-21 NOTE — ED Provider Notes (Signed)
[facility] DEPT Provider Note   CSN: [id number] Arrival date & time: 11/21/16  1621     History   Chief Complaint Chief Complaint  Patient presents with  . Altered Mental Status    HPI Randy Sullivan is a 83 y.o. male.  Pt presents to the ED today with altered mental status.  He has his left knee replaced by Dr. Maureen Ralphs on 9/17.  He was slow to recover, so he went to rehab for almost 2 weeks.  He was seen by Dr. Sharlett Iles at the SNF who noticed some redness and swelling to the left knee on 9/23.  He was started on doxycycline.  He did see Dr. Maureen Ralphs on 9/25 for a follow up.  The redness has improved, but is still noticeable.  Pt was d/c from SNF on 9/27 and was very tired.  He did f/u with PT on the 28th and was ok during the day, but then developed a fever that night.  No more fever since then.  Today, his wife noticed that he was confused today.      Past Medical History:  Diagnosis Date  . BPH (benign prostatic hyperplasia)   . Cancer ([place])    non melanoma skin cancers removed  . DJD (degenerative joint disease)   . Familial tremor   . GERD (gastroesophageal reflux disease)   . Heart murmur    hx of  . Hyperlipidemia   . Hypertension   . Hypothyroidism   . Mood disorder ([place])   . Osteoarthritis   . Prostatism   . Shortness of breath    mild with excertion  . Sleep apnea    cpap  . Thyroid disease     Patient Active Problem List   Diagnosis Date Noted  . OA (osteoarthritis) of knee 11/08/2016  . Essential hypertension 02/05/2016  . Thoracic aortic atherosclerosis ([place]) 02/05/2016  . Benign familial tremor 02/05/2016  . COPD (chronic obstructive pulmonary disease) ([place]) 01/21/2016  . OSA (obstructive sleep apnea) 10/23/2015  . Upper airway cough syndrome 10/23/2015  . Exertional dyspnea 10/23/2015  . RLS (restless legs syndrome) 10/23/2015  . Osteoarthritis of knee 06/04/2010  . Dizziness 06/04/2010  . Benign hypertensive heart disease without heart  failure 06/04/2010  . Hypercholesterolemia 06/04/2010  . BPH (benign prostatic hyperplasia) 06/04/2010    Past Surgical History:  Procedure Laterality Date  . APPENDECTOMY    . EYE SURGERY     bil cataracts  . HEMORROIDECTOMY    . TONSILLECTOMY    . TOTAL KNEE ARTHROPLASTY Left 11/08/2016   Procedure: LEFT TOTAL KNEE ARTHROPLASTY;  Surgeon: Gaynelle Arabian, MD;  Location: [facility];  Service: Orthopedics;  Laterality: Left;  Adductor Block       Home Medications    Prior to Admission medications   Medication Sig Start Date End Date Taking? Authorizing Provider  acetaminophen (TYLENOL) 325 MG tablet Take 2 tablets (650 mg total) by mouth every 6 (six) hours as needed for mild pain (or Fever >/= 101). 11/12/16  Yes Perkins, Alexzandrew L, PA-C  docusate sodium (COLACE) 100 MG capsule Take 1 capsule (100 mg total) by mouth 2 (two) times daily. 11/12/16  Yes Perkins, Alexzandrew L, PA-C  doxycycline (VIBRA-TABS) 100 MG tablet Take 100 mg by mouth 2 (two) times daily. 11/17/16  Yes [provider]  fluticasone furoate-vilanterol (BREO ELLIPTA) 100-25 MCG/INH AEPB Inhale 1 puff into the lungs daily. Patient taking differently: Inhale 1 puff into the lungs at bedtime.  08/20/16  Yes  Parrett, Tammy S, NP  gabapentin (NEURONTIN) 300 MG capsule Take 300 mg by mouth at bedtime.   Yes [provider]  iron polysaccharides (NIFEREX) 150 MG capsule Take 150 mg by mouth daily.   Yes [provider]  levothyroxine (SYNTHROID, LEVOTHROID) 112 MCG tablet Take 112 mcg by mouth daily before breakfast.  10/14/14  Yes [provider]  loratadine (CLARITIN) 10 MG tablet Take 10 mg by mouth daily.   Yes [provider]  megestrol (MEGACE) 20 MG tablet Take 40 mg by mouth daily before breakfast.   Yes [provider]  montelukast (SINGULAIR) 10 MG tablet Take 1 tablet (10 mg total) by mouth at bedtime. 09/07/16  Yes Parrett, Tammy S, NP  MYRBETRIQ 50 MG TB24 tablet  Take 50 mg by mouth daily.  06/03/15  Yes [provider]  omeprazole (PRILOSEC) 20 MG capsule Take 20 mg by mouth 2 (two) times daily before a meal.    Yes [provider]  oxyCODONE (OXY IR/ROXICODONE) 5 MG immediate release tablet Take 1-2 tablets (5-10 mg total) by mouth every 4 (four) hours as needed for moderate pain or severe pain. 11/12/16  Yes Perkins, Alexzandrew L, PA-C  Polyethyl Glycol-Propyl Glycol (SYSTANE OP) Apply 1 drop to eye daily.   Yes [provider]  polyethylene glycol (MIRALAX / GLYCOLAX) packet Take 17 g by mouth daily as needed for mild constipation.   Yes [provider]  propranolol (INDERAL) 20 MG tablet Take 20 mg by mouth daily.  06/18/16  Yes [provider]  rivaroxaban (XARELTO) 10 MG TABS tablet Take 1 tablet (10 mg total) by mouth daily with breakfast. Take Xarelto for two and a half more weeks following discharge from the hospital, then discontinue Xarelto. Once the patient has completed the Xarelto, they may resume the 81 mg Aspirin. 11/12/16  Yes Perkins, Alexzandrew L, PA-C  rosuvastatin (CRESTOR) 5 MG tablet Take 5 mg by mouth at bedtime.  06/23/16  Yes [provider]  solifenacin (VESICARE) 5 MG tablet Take 5 mg by mouth every evening.   Yes [provider]  traMADol (ULTRAM) 50 MG tablet Take 1-2 tablets (50-100 mg total) by mouth every 6 (six) hours as needed for moderate pain. 11/12/16  Yes Perkins, Alexzandrew L, PA-C  albuterol (PROVENTIL HFA;VENTOLIN HFA) 108 (90 Base) MCG/ACT inhaler Inhale 2 puffs into the lungs every 4 (four) hours as needed for wheezing or shortness of breath. 01/21/16   de Dios, Creola A, MD  artificial tears (LACRILUBE) OINT ophthalmic ointment Place into both eyes daily. Patient not taking: Reported on 11/21/2016 11/13/16   Dara Lords, Alexzandrew L, PA-C  bisacodyl (DULCOLAX) 10 MG suppository Place 1 suppository (10 mg total) rectally daily as needed for moderate  constipation. Patient not taking: Reported on 11/21/2016 11/12/16   Dara Lords, Alexzandrew L, PA-C  diphenhydrAMINE (BENADRYL) 12.5 MG/5ML elixir Take 5-10 mLs (12.5-25 mg total) by mouth every 4 (four) hours as needed for itching. Patient not taking: Reported on 11/21/2016 11/12/16   Dara Lords, Alexzandrew L, PA-C  fluticasone (FLONASE) 50 MCG/ACT nasal spray Place 1 spray into both nostrils daily as needed for allergies.     [provider]  sodium phosphate (FLEET) 7-19 GM/118ML ENEM Place 133 mLs (1 enema total) rectally once as needed for severe constipation. Patient not taking: Reported on 11/21/2016 11/12/16   Dara Lords, Alexzandrew L, PA-C  tiZANidine (ZANAFLEX) 4 MG tablet Take 1 tablet (4 mg total) by mouth 3 (three) times daily. 11/12/16 11/12/17  Perkins, Alexzandrew L, PA-C    Family History Family History  Problem Relation Age of Onset  . Heart attack Father   . Colon cancer Mother   . Colon cancer Sister     Social History Social History  Substance Use Topics  . Smoking status: Former Smoker    Packs/day: 2.00    Years: 18.00    Types: Cigarettes    Quit date: 06/04/1970  . Smokeless tobacco: Never Used  . Alcohol use 0.0 oz/week     Comment: special occasions     Allergies   Tizanidine   Review of Systems Review of Systems  Musculoskeletal:       Left knee pain with redness  Neurological: Positive for weakness.  All other systems reviewed and are negative.    Physical Exam Updated Vital Signs BP (!) 162/93 (BP Location: Left Arm)   Pulse (!) 105   Temp 97.6 F (36.4 C) (Oral)   Resp 18   Ht 5\' 10"  (1.778 m)   Wt 86.2 kg (190 lb)   SpO2 100%   BMI 27.26 kg/m   Physical Exam  Constitutional: He appears well-developed and well-nourished.  HENT:  Head: Normocephalic and atraumatic.  Right Ear: External ear normal.  Left Ear: External ear normal.  Nose: Nose normal.  Mouth/Throat: Oropharynx is clear and moist.  Eyes: Pupils are equal, round, and  reactive to light. Conjunctivae and EOM are normal.  Neck: Normal range of motion. Neck supple.  Cardiovascular: Normal rate, regular rhythm, normal heart sounds and intact distal pulses.   Pulmonary/Chest: Effort normal and breath sounds normal.  Abdominal: Soft. Bowel sounds are normal.  Musculoskeletal:       Left knee: He exhibits swelling and erythema.  Wife reports redness and swelling improved from last week  Neurological: He is alert.  Skin: Skin is warm.  Psychiatric: He has a normal mood and affect. His behavior is normal. Judgment and thought content normal.  Nursing note and vitals reviewed.    ED Treatments / Results  Labs (all labs ordered are listed, but only abnormal results are displayed) Labs Reviewed  COMPREHENSIVE METABOLIC PANEL - Abnormal; Notable for the following:       Result Value   Sodium 133 (*)    Glucose, Bld 105 (*)    BUN 26 (*)    Albumin 3.2 (*)    GFR calc non Af Amer 58 (*)    All other components within normal limits  CBC - Abnormal; Notable for the following:    WBC 12.0 (*)    RBC 2.83 (*)    Hemoglobin 8.6 (*)    HCT 25.8 (*)    Platelets 479 (*)    All other components within normal limits  URINALYSIS, ROUTINE W REFLEX MICROSCOPIC - Abnormal; Notable for the following:    Protein, ur 30 (*)    All other components within normal limits  CBG MONITORING, ED - Abnormal; Notable for the following:    Glucose-Capillary 104 (*)    All other components within normal limits  I-STAT CG4 LACTIC ACID, ED    EKG  EKG Interpretation  Date/Time:  Sunday November 21 2016 19:36:36 EDT Ventricular Rate:  95 PR Interval:    QRS Duration: 86 QT Interval:  351 QTC Calculation: 442 R Axis:   48 Text Interpretation:  Sinus rhythm Low voltage, extremity leads Confirmed by Isla Pence [phone]) on 11/21/2016 8:13:32 PM       Radiology Dg Chest 2 View  Result Date: 11/21/2016 CLINICAL DATA:  Altered mental status. EXAM: CHEST  2 VIEW  COMPARISON:  Radiographs of October 22, 2015. FINDINGS: The heart size and mediastinal contours are within normal limits. Both lungs are clear. Atherosclerosis of thoracic aorta is noted. No pneumothorax or pleural effusion is noted. The visualized skeletal structures are unremarkable. IMPRESSION: No active cardiopulmonary disease.  Aortic atherosclerosis. Electronically Signed   By: Marijo Conception, M.D.   On: 11/21/2016 20:15   Ct Head Wo Contrast  Result Date: 11/21/2016 CLINICAL DATA:  Altered level of consciousness without explanation. Generalize weakness. Left knee replacement 2 weeks ago. EXAM: CT HEAD WITHOUT CONTRAST TECHNIQUE: Contiguous axial images were obtained from the base of the skull through the vertex without intravenous contrast. COMPARISON:  07/02/2015 FINDINGS: Brain: Mild diffuse cerebral atrophy. Mild ventricular dilatation consistent with central atrophy. Scattered low-attenuation changes in the deep white matter consistent with small vessel ischemia. No mass-effect or midline shift. No abnormal extra-axial fluid collections. Gray-white matter junctions are distinct. Basal cisterns are not effaced. No acute intracranial hemorrhage. Vascular: No hyperdense vessel or unexpected calcification. Skull: Normal. Negative for fracture or focal lesion. Sinuses/Orbits: No acute finding. Other: None. IMPRESSION: No acute intracranial abnormalities. Chronic atrophy and small vessel ischemic changes. Electronically Signed   By: Lucienne Capers M.D.   On: 11/21/2016 23:08   Dg Knee Complete 4 Views Left  Result Date: 11/21/2016 CLINICAL DATA:  Left knee pain after surgery 2 weeks ago. EXAM: LEFT KNEE - COMPLETE 4+ VIEW COMPARISON:  None. FINDINGS: Status post left total knee arthroplasty. The femoral and tibial components are well situated. No fracture or dislocation is noted. No joint effusion is noted. IMPRESSION: Status post left total knee arthroplasty. No acute abnormality seen the left knee.  Electronically Signed   By: Marijo Conception, M.D.   On: 11/21/2016 20:12    Procedures Procedures (including critical care time)  Medications Ordered in ED Medications  sodium chloride 0.9 % bolus 1,000 mL (1,000 mLs Intravenous New Bag/Given 11/21/16 1942)     Initial Impression / Assessment and Plan / ED Course  I have reviewed the triage vital signs and the nursing notes.  Pertinent labs & imaging results that were available during my care of the patient were reviewed by me and considered in my medical decision making (see chart for details).   Pt looks much better after IVFs.  Hgb is slightly low, but it's been low since surgery.  Pt;s knee has improved.  Pt has an appointment with Dr. Maureen Ralphs on 10/2.  He knows to return if worse.  Final Clinical Impressions(s) / ED Diagnoses   Final diagnoses:  Dehydration    New Prescriptions New Prescriptions   No medications on file     Isla Pence, MD 11/21/16 2356

## 2016-11-22 DIAGNOSIS — Z6826 Body mass index (BMI) 26.0-26.9, adult: Secondary | ICD-10-CM | POA: Diagnosis not present

## 2016-11-22 DIAGNOSIS — R4182 Altered mental status, unspecified: Secondary | ICD-10-CM | POA: Diagnosis not present

## 2016-11-22 DIAGNOSIS — Z96652 Presence of left artificial knee joint: Secondary | ICD-10-CM | POA: Diagnosis not present

## 2016-11-22 DIAGNOSIS — L03116 Cellulitis of left lower limb: Secondary | ICD-10-CM | POA: Diagnosis not present

## 2016-11-22 DIAGNOSIS — R197 Diarrhea, unspecified: Secondary | ICD-10-CM | POA: Diagnosis not present

## 2016-11-22 NOTE — ED Notes (Signed)
Patient ambulated with walker in hall with no distress. Denies pain, dizziness, or lightheadedness. Patient tolerated well and ambulated at good pace. Able to sit self up on the side of bed and sit self down in chair with standby assist only.

## 2016-11-23 DIAGNOSIS — M1712 Unilateral primary osteoarthritis, left knee: Secondary | ICD-10-CM | POA: Diagnosis not present

## 2016-11-24 ENCOUNTER — Ambulatory Visit: Payer: MEDICARE | Admitting: Adult Health

## 2016-11-24 DIAGNOSIS — R197 Diarrhea, unspecified: Secondary | ICD-10-CM | POA: Diagnosis not present

## 2016-11-25 ENCOUNTER — Ambulatory Visit (INDEPENDENT_AMBULATORY_CARE_PROVIDER_SITE_OTHER): Payer: MEDICARE | Admitting: Adult Health

## 2016-11-25 ENCOUNTER — Encounter: Payer: Self-pay | Admitting: Adult Health

## 2016-11-25 VITALS — BP 132/64 | HR 77 | Ht 72.0 in | Wt 185.0 lb

## 2016-11-25 DIAGNOSIS — Z6825 Body mass index (BMI) 25.0-25.9, adult: Secondary | ICD-10-CM | POA: Diagnosis not present

## 2016-11-25 DIAGNOSIS — J439 Emphysema, unspecified: Secondary | ICD-10-CM | POA: Diagnosis not present

## 2016-11-25 DIAGNOSIS — G4733 Obstructive sleep apnea (adult) (pediatric): Secondary | ICD-10-CM

## 2016-11-25 DIAGNOSIS — I10 Essential (primary) hypertension: Secondary | ICD-10-CM | POA: Diagnosis not present

## 2016-11-25 DIAGNOSIS — R413 Other amnesia: Secondary | ICD-10-CM | POA: Diagnosis not present

## 2016-11-25 DIAGNOSIS — D649 Anemia, unspecified: Secondary | ICD-10-CM | POA: Diagnosis not present

## 2016-11-25 DIAGNOSIS — R6889 Other general symptoms and signs: Secondary | ICD-10-CM | POA: Diagnosis not present

## 2016-11-25 DIAGNOSIS — R197 Diarrhea, unspecified: Secondary | ICD-10-CM | POA: Diagnosis not present

## 2016-11-25 DIAGNOSIS — Z96652 Presence of left artificial knee joint: Secondary | ICD-10-CM | POA: Diagnosis not present

## 2016-11-25 NOTE — Progress Notes (Signed)
@Patient  ID: Randy Sullivan, male    DOB: 10/27/1928, 83 y.o.   MRN: [id number]  Chief Complaint  Patient presents with  . Follow-up    OSA     Referring provider: Prince Solian, MD  HPI: 83 year old male followed for obstructive sleep apnea and COPD   TEST  PFT  2.63  90%, DLCO 58%  11/25/2016 Follow up ; OSA  Patient returns for a six-month follow-up. Patient has known sleep apnea. Patient says he's doing good on C Pap. Does feel the pressures are high at times. In the mask leaks. Patient is planning on going for a C Pap mask fitting. Over the last month. Patient's C Pap use has been intermittent. Due to recent knee surgery. Download shows 83% usage. Average usage around 5 hours. AHI 9.6. Positive leaks. Patient is on AutoSet 5-15 cm H2O. Median pressure is 9.2. Patient has mild COPD. Patient is on BREO daily.. Says his breathing is doing good overall. Did well from surgery without breathing issues . Denies flare of cough or wheezing .     Allergies  Allergen Reactions  . Tizanidine Other (See Comments)    Hypotension     Immunization History  Administered Date(s) Administered  . Influenza, High Dose Seasonal PF 10/21/2016  . Influenza,inj,Quad PF,6+ Mos 10/23/2015  . Pneumococcal Conjugate-13 02/12/2014  . Pneumococcal Polysaccharide-23 02/12/2013  . Tdap 03/04/2010  . Zoster 08/20/2013    Past Medical History:  Diagnosis Date  . BPH (benign prostatic hyperplasia)   . Cancer ([employer])    non melanoma skin cancers removed  . DJD (degenerative joint disease)   . Familial tremor   . GERD (gastroesophageal reflux disease)   . Heart murmur    hx of  . Hyperlipidemia   . Hypertension   . Hypothyroidism   . Mood disorder ([place])   . Osteoarthritis   . Prostatism   . Shortness of breath    mild with excertion  . Sleep apnea    cpap  . Thyroid disease     Tobacco History: History  Smoking Status  . Former Smoker  . Packs/day: 2.00  . Years: 18.00  . Types:  Cigarettes  . Quit date: 06/04/1970  Smokeless Tobacco  . Never Used   Counseling given: Not Answered   Outpatient Encounter Prescriptions as of 11/25/2016  Medication Sig  . acetaminophen (TYLENOL) 325 MG tablet Take 2 tablets (650 mg total) by mouth every 6 (six) hours as needed for mild pain (or Fever >/= 101).  Marland Kitchen albuterol (PROVENTIL HFA;VENTOLIN HFA) 108 (90 Base) MCG/ACT inhaler Inhale 2 puffs into the lungs every 4 (four) hours as needed for wheezing or shortness of breath.  Marland Kitchen artificial tears (LACRILUBE) OINT ophthalmic ointment Place into both eyes daily.  . bisacodyl (DULCOLAX) 10 MG suppository Place 1 suppository (10 mg total) rectally daily as needed for moderate constipation.  . docusate sodium (COLACE) 100 MG capsule Take 1 capsule (100 mg total) by mouth 2 (two) times daily.  . fluticasone (FLONASE) 50 MCG/ACT nasal spray Place 1 spray into both nostrils daily as needed for allergies.   . fluticasone furoate-vilanterol (BREO ELLIPTA) 100-25 MCG/INH AEPB Inhale 1 puff into the lungs daily. (Patient taking differently: Inhale 1 puff into the lungs at bedtime. )  . gabapentin (NEURONTIN) 300 MG capsule Take 300 mg by mouth at bedtime.  . iron polysaccharides (NIFEREX) 150 MG capsule Take 150 mg by mouth daily.  Marland Kitchen levothyroxine (SYNTHROID, LEVOTHROID) 112 MCG tablet Take 112  mcg by mouth daily before breakfast.   . loratadine (CLARITIN) 10 MG tablet Take 10 mg by mouth daily.  . megestrol (MEGACE) 20 MG tablet Take 40 mg by mouth daily before breakfast.  . montelukast (SINGULAIR) 10 MG tablet Take 1 tablet (10 mg total) by mouth at bedtime.  Marland Kitchen MYRBETRIQ 50 MG TB24 tablet Take 50 mg by mouth daily.   Marland Kitchen omeprazole (PRILOSEC) 20 MG capsule Take 20 mg by mouth 2 (two) times daily before a meal.   . Polyethyl Glycol-Propyl Glycol (SYSTANE OP) Apply 1 drop to eye daily.  . polyethylene glycol (MIRALAX / GLYCOLAX) packet Take 17 g by mouth daily as needed for mild constipation.  .  propranolol (INDERAL) 20 MG tablet Take 20 mg by mouth daily.   . rivaroxaban (XARELTO) 10 MG TABS tablet Take 10 mg by mouth daily. Until 10/7, then resume Aspirin 81mg  daily  . rosuvastatin (CRESTOR) 5 MG tablet Take 5 mg by mouth at bedtime.   . sodium phosphate (FLEET) 7-19 GM/118ML ENEM Place 133 mLs (1 enema total) rectally once as needed for severe constipation.  . solifenacin (VESICARE) 5 MG tablet Take 5 mg by mouth every evening.  . diphenhydrAMINE (BENADRYL) 12.5 MG/5ML elixir Take 5-10 mLs (12.5-25 mg total) by mouth every 4 (four) hours as needed for itching. (Patient not taking: Reported on 11/21/2016)  . oxyCODONE (OXY IR/ROXICODONE) 5 MG immediate release tablet Take 1-2 tablets (5-10 mg total) by mouth every 4 (four) hours as needed for moderate pain or severe pain. (Patient not taking: Reported on 11/25/2016)  . traMADol (ULTRAM) 50 MG tablet Take 1-2 tablets (50-100 mg total) by mouth every 6 (six) hours as needed for moderate pain. (Patient not taking: Reported on 11/25/2016)  . [DISCONTINUED] doxycycline (VIBRA-TABS) 100 MG tablet Take 100 mg by mouth 2 (two) times daily.  . [DISCONTINUED] rivaroxaban (XARELTO) 10 MG TABS tablet Take 1 tablet (10 mg total) by mouth daily with breakfast. Take Xarelto for two and a half more weeks following discharge from the hospital, then discontinue Xarelto. Once the patient has completed the Xarelto, they may resume the 81 mg Aspirin. (Patient not taking: Reported on 11/25/2016)  . [DISCONTINUED] tiZANidine (ZANAFLEX) 4 MG tablet Take 1 tablet (4 mg total) by mouth 3 (three) times daily. (Patient not taking: Reported on 11/25/2016)   No facility-administered encounter medications on file as of 11/25/2016.      Review of Systems  Constitutional:   No  weight loss, night sweats,  Fevers, chills,  +fatigue, or  lassitude.  HEENT:   No headaches,  Difficulty swallowing,  Tooth/dental problems, or  Sore throat,                No sneezing, itching,  ear ache, nasal congestion, post nasal drip,   CV:  No chest pain,  Orthopnea, PND, swelling in lower extremities, anasarca, dizziness, palpitations, syncope.   GI  No heartburn, indigestion, abdominal pain, nausea, vomiting, diarrhea, change in bowel habits, loss of appetite, bloody stools.   Resp:   No chest wall deformity  Skin: no rash or lesions.  GU: no dysuria, change in color of urine, no urgency or frequency.  No flank pain, no hematuria   MS:  No joint pain or swelling.  No decreased range of motion.  No back pain.    Physical Exam  BP 132/64 (BP Location: Left Arm, Cuff Size: Normal)   Pulse 77   Ht 6' (1.829 m)   Wt 185 lb (  83.9 kg)   SpO2 97%   BMI 25.09 kg/m   GEN: A/Ox3; pleasant , NAD , elderly , walker    HEENT:  [place]/AT,  EACs-clear, TMs-wnl, NOSE-clear, THROAT-clear, no lesions, no postnasal drip or exudate noted. Class 2-3 MP airway   NECK:  Supple w/ fair ROM; no JVD; normal carotid impulses w/o bruits; no thyromegaly or nodules palpated; no lymphadenopathy.    RESP  Clear  P & A; w/o, wheezes/ rales/ or rhonchi. no accessory muscle use, no dullness to percussion  CARD:  RRR, no m/r/g, no peripheral edema, pulses intact, no cyanosis or clubbing.  GI:   Soft & nt; nml bowel sounds; no organomegaly or masses detected.   Musco: Warm bil, no deformities or joint swelling noted.   Neuro: alert, no focal deficits noted.    Skin: Warm, no lesions or rashes    Lab Results:  CBC   BNP No results found for: BNP  ProBNP No results found for: PROBNP  Imaging: Dg Chest 2 View  Result Date: 11/21/2016 CLINICAL DATA:  Altered mental status. EXAM: CHEST  2 VIEW COMPARISON:  Radiographs of October 22, 2015. FINDINGS: The heart size and mediastinal contours are within normal limits. Both lungs are clear. Atherosclerosis of thoracic aorta is noted. No pneumothorax or pleural effusion is noted. The visualized skeletal structures are unremarkable. IMPRESSION:  No active cardiopulmonary disease.  Aortic atherosclerosis. Electronically Signed   By: Marijo Conception, M.D.   On: 11/21/2016 20:15   Ct Head Wo Contrast  Result Date: 11/21/2016 CLINICAL DATA:  Altered level of consciousness without explanation. Generalize weakness. Left knee replacement 2 weeks ago. EXAM: CT HEAD WITHOUT CONTRAST TECHNIQUE: Contiguous axial images were obtained from the base of the skull through the vertex without intravenous contrast. COMPARISON:  07/02/2015 FINDINGS: Brain: Mild diffuse cerebral atrophy. Mild ventricular dilatation consistent with central atrophy. Scattered low-attenuation changes in the deep white matter consistent with small vessel ischemia. No mass-effect or midline shift. No abnormal extra-axial fluid collections. Gray-white matter junctions are distinct. Basal cisterns are not effaced. No acute intracranial hemorrhage. Vascular: No hyperdense vessel or unexpected calcification. Skull: Normal. Negative for fracture or focal lesion. Sinuses/Orbits: No acute finding. Other: None. IMPRESSION: No acute intracranial abnormalities. Chronic atrophy and small vessel ischemic changes. Electronically Signed   By: Lucienne Capers M.D.   On: 11/21/2016 23:08   Dg Knee Complete 4 Views Left  Result Date: 11/21/2016 CLINICAL DATA:  Left knee pain after surgery 2 weeks ago. EXAM: LEFT KNEE - COMPLETE 4+ VIEW COMPARISON:  None. FINDINGS: Status post left total knee arthroplasty. The femoral and tibial components are well situated. No fracture or dislocation is noted. No joint effusion is noted. IMPRESSION: Status post left total knee arthroplasty. No acute abnormality seen the left knee. Electronically Signed   By: Marijo Conception, M.D.   On: 11/21/2016 20:12     Assessment & Plan:   No problem-specific Assessment & Plan notes found for this encounter.     Rexene Edison, NP 11/25/2016

## 2016-11-25 NOTE — Patient Instructions (Signed)
Change CPAP to 5 to 12cmH2O.  Wear CPAP At bedtime  , each night for at least 4-6hr .  Do not drive if sleepy .  Weight loss.  Mask fitting .  CPAP download in 6 weeks  Continue on BREO daily , rinse after use.  Follow up with Dr. Elsworth Soho in 6 months and As needed

## 2016-11-26 DIAGNOSIS — M25562 Pain in left knee: Secondary | ICD-10-CM | POA: Diagnosis not present

## 2016-11-26 NOTE — Assessment & Plan Note (Signed)
Will try to adjust pressure for comfort and change mask to see if this helps with control  Recheck download in 6 weeks , if AHI is not better may need CPAP titration study  Plan  Patient Instructions  Change CPAP to 5 to 12cmH2O.  Wear CPAP At bedtime  , each night for at least 4-6hr .  Do not drive if sleepy .  Weight loss.  Mask fitting .  CPAP download in 6 weeks  Continue on BREO daily , rinse after use.  Follow up with Dr. Elsworth Soho in 6 months and As needed

## 2016-11-26 NOTE — Assessment & Plan Note (Signed)
Doing well on BREO .  No changes

## 2016-11-29 DIAGNOSIS — M25562 Pain in left knee: Secondary | ICD-10-CM | POA: Diagnosis not present

## 2016-11-30 DIAGNOSIS — R350 Frequency of micturition: Secondary | ICD-10-CM | POA: Diagnosis not present

## 2016-11-30 DIAGNOSIS — N3941 Urge incontinence: Secondary | ICD-10-CM | POA: Diagnosis not present

## 2016-12-01 DIAGNOSIS — M25562 Pain in left knee: Secondary | ICD-10-CM | POA: Diagnosis not present

## 2016-12-03 DIAGNOSIS — M25562 Pain in left knee: Secondary | ICD-10-CM | POA: Diagnosis not present

## 2016-12-06 ENCOUNTER — Ambulatory Visit (HOSPITAL_BASED_OUTPATIENT_CLINIC_OR_DEPARTMENT_OTHER): Payer: MEDICARE | Attending: Adult Health | Admitting: Radiology

## 2016-12-06 DIAGNOSIS — M25562 Pain in left knee: Secondary | ICD-10-CM | POA: Diagnosis not present

## 2016-12-06 DIAGNOSIS — G4733 Obstructive sleep apnea (adult) (pediatric): Secondary | ICD-10-CM

## 2016-12-08 DIAGNOSIS — M25562 Pain in left knee: Secondary | ICD-10-CM | POA: Diagnosis not present

## 2016-12-10 DIAGNOSIS — M25562 Pain in left knee: Secondary | ICD-10-CM | POA: Diagnosis not present

## 2016-12-13 DIAGNOSIS — M25562 Pain in left knee: Secondary | ICD-10-CM | POA: Diagnosis not present

## 2016-12-14 DIAGNOSIS — M1712 Unilateral primary osteoarthritis, left knee: Secondary | ICD-10-CM | POA: Diagnosis not present

## 2016-12-14 DIAGNOSIS — Z471 Aftercare following joint replacement surgery: Secondary | ICD-10-CM | POA: Diagnosis not present

## 2016-12-14 DIAGNOSIS — Z96652 Presence of left artificial knee joint: Secondary | ICD-10-CM | POA: Diagnosis not present

## 2016-12-15 DIAGNOSIS — M25562 Pain in left knee: Secondary | ICD-10-CM | POA: Diagnosis not present

## 2016-12-16 DIAGNOSIS — Z96652 Presence of left artificial knee joint: Secondary | ICD-10-CM | POA: Diagnosis not present

## 2016-12-16 DIAGNOSIS — K5909 Other constipation: Secondary | ICD-10-CM | POA: Diagnosis not present

## 2016-12-16 DIAGNOSIS — D649 Anemia, unspecified: Secondary | ICD-10-CM | POA: Diagnosis not present

## 2016-12-16 DIAGNOSIS — Z6825 Body mass index (BMI) 25.0-25.9, adult: Secondary | ICD-10-CM | POA: Diagnosis not present

## 2016-12-17 DIAGNOSIS — M25562 Pain in left knee: Secondary | ICD-10-CM | POA: Diagnosis not present

## 2016-12-18 ENCOUNTER — Encounter (HOSPITAL_COMMUNITY): Payer: Self-pay | Admitting: Emergency Medicine

## 2016-12-18 ENCOUNTER — Emergency Department (HOSPITAL_COMMUNITY): Payer: MEDICARE

## 2016-12-18 ENCOUNTER — Emergency Department (HOSPITAL_COMMUNITY)
Admission: EM | Admit: 2016-12-18 | Discharge: 2016-12-18 | Disposition: A | Discharge status: Home / Self Care | Payer: MEDICARE | Attending: Emergency Medicine | Admitting: Emergency Medicine

## 2016-12-18 DIAGNOSIS — R7989 Other specified abnormal findings of blood chemistry: Secondary | ICD-10-CM | POA: Diagnosis not present

## 2016-12-18 DIAGNOSIS — N179 Acute kidney failure, unspecified: Secondary | ICD-10-CM | POA: Insufficient documentation

## 2016-12-18 DIAGNOSIS — R55 Syncope and collapse: Secondary | ICD-10-CM | POA: Insufficient documentation

## 2016-12-18 DIAGNOSIS — E039 Hypothyroidism, unspecified: Secondary | ICD-10-CM | POA: Insufficient documentation

## 2016-12-18 DIAGNOSIS — J449 Chronic obstructive pulmonary disease, unspecified: Secondary | ICD-10-CM | POA: Insufficient documentation

## 2016-12-18 DIAGNOSIS — Z79899 Other long term (current) drug therapy: Secondary | ICD-10-CM | POA: Insufficient documentation

## 2016-12-18 DIAGNOSIS — I10 Essential (primary) hypertension: Secondary | ICD-10-CM | POA: Insufficient documentation

## 2016-12-18 DIAGNOSIS — D649 Anemia, unspecified: Secondary | ICD-10-CM

## 2016-12-18 DIAGNOSIS — Z96652 Presence of left artificial knee joint: Secondary | ICD-10-CM | POA: Insufficient documentation

## 2016-12-18 DIAGNOSIS — Z7901 Long term (current) use of anticoagulants: Secondary | ICD-10-CM | POA: Insufficient documentation

## 2016-12-18 DIAGNOSIS — I700 Atherosclerosis of aorta: Secondary | ICD-10-CM | POA: Diagnosis not present

## 2016-12-18 DIAGNOSIS — Z87891 Personal history of nicotine dependence: Secondary | ICD-10-CM | POA: Insufficient documentation

## 2016-12-18 DIAGNOSIS — R404 Transient alteration of awareness: Secondary | ICD-10-CM | POA: Diagnosis not present

## 2016-12-18 LAB — CBC
HCT: 28.5 % — ABNORMAL LOW (ref 39.0–52.0)
HEMOGLOBIN: 9.4 g/dL — AB (ref 13.0–17.0)
MCH: 29.8 pg (ref 26.0–34.0)
MCHC: 33 g/dL (ref 30.0–36.0)
MCV: 90.5 fL (ref 78.0–100.0)
Platelets: 247 10*3/uL (ref 150–400)
RBC: 3.15 MIL/uL — AB (ref 4.22–5.81)
RDW: 14.4 % (ref 11.5–15.5)
WBC: 8.4 10*3/uL (ref 4.0–10.5)

## 2016-12-18 LAB — URINALYSIS, ROUTINE W REFLEX MICROSCOPIC
BILIRUBIN URINE: NEGATIVE
Glucose, UA: NEGATIVE mg/dL
Hgb urine dipstick: NEGATIVE
KETONES UR: NEGATIVE mg/dL
LEUKOCYTES UA: NEGATIVE
NITRITE: NEGATIVE
Protein, ur: NEGATIVE mg/dL
SPECIFIC GRAVITY, URINE: 1.015 (ref 1.005–1.030)
pH: 6 (ref 5.0–8.0)

## 2016-12-18 LAB — BASIC METABOLIC PANEL
ANION GAP: 8 (ref 5–15)
BUN: 26 mg/dL — ABNORMAL HIGH (ref 6–20)
CALCIUM: 9.3 mg/dL (ref 8.9–10.3)
CO2: 21 mmol/L — ABNORMAL LOW (ref 22–32)
Chloride: 103 mmol/L (ref 101–111)
Creatinine, Ser: 1.34 mg/dL — ABNORMAL HIGH (ref 0.61–1.24)
GFR, EST AFRICAN AMERICAN: 54 mL/min — AB (ref >60)
GFR, EST NON AFRICAN AMERICAN: 46 mL/min — AB (ref >60)
GLUCOSE: 112 mg/dL — AB (ref 65–99)
POTASSIUM: 4.1 mmol/L (ref 3.5–5.1)
SODIUM: 132 mmol/L — AB (ref 135–145)

## 2016-12-18 LAB — I-STAT TROPONIN, ED: TROPONIN I, POC: 0 ng/mL (ref 0.00–0.08)

## 2016-12-18 LAB — D-DIMER, QUANTITATIVE (NOT AT ARMC): D DIMER QUANT: 4.88 ug{FEU}/mL — AB (ref 0.00–0.50)

## 2016-12-18 MED ORDER — IOPAMIDOL (ISOVUE-370) INJECTION 76%
INTRAVENOUS | Status: AC
Start: 2016-12-18 — End: 2016-12-18
  Administered 2016-12-18: 100 mL via INTRAVENOUS
  Filled 2016-12-18: qty 100

## 2016-12-18 MED ORDER — SODIUM CHLORIDE 0.9 % IV BOLUS (SEPSIS)
1000.0000 mL | Freq: Once | INTRAVENOUS | Status: AC
  Administered 2016-12-18: 1000 mL via INTRAVENOUS

## 2016-12-18 NOTE — ED Provider Notes (Signed)
[facility] Provider Note   CSN: [id number] Arrival date & time: 12/18/16  1109     History   Chief Complaint Chief Complaint  Patient presents with  . Loss of Consciousness    HPI [facility] is a 83 y.o. male who presents with syncope. PMH significant for HTN, HLD, arthritis s/p left knee replacement, COPD. Wife is at bedside. She states that the patient was in his usual state of health today. They were getting ready to go to a funeral. The patient went to the bathroom and was straining on the toilet and had a syncopal episode. His wife found him sitting on the toilet and he would not wake up. He was unconscious for approximately 5 minutes. EMS was called and they obtained a BP which was low (88F systolic). Currently the patient denies any symptoms. He has been doing well in rehab and his knee is improving. His wife states that he has not had any changes in medicines recently and has not had to take any pain medicines. He has been taking Iron for low hgb. Stools have been black. His knee is still swollen but he denies any pain or calf pain. He denies chest pain, SOB, feeling lightheaded. He did have a prior syncopal episode several weeks ago while doing PT and it was thought to be due to Tizanidine. He has not had any Tizanidine since this episode.   HPI  Past Medical History:  Diagnosis Date  . BPH (benign prostatic hyperplasia)   . Cancer ([place])    non melanoma skin cancers removed  . DJD (degenerative joint disease)   . Familial tremor   . GERD (gastroesophageal reflux disease)   . Heart murmur    hx of  . Hyperlipidemia   . Hypertension   . Hypothyroidism   . Mood disorder ([place])   . Osteoarthritis   . Prostatism   . Shortness of breath    mild with excertion  . Sleep apnea    cpap  . Thyroid disease     Patient Active Problem List   Diagnosis Date Noted  . OA (osteoarthritis) of knee 11/08/2016  . Essential hypertension  02/05/2016  . Thoracic aortic atherosclerosis ([place]) 02/05/2016  . Benign familial tremor 02/05/2016  . COPD (chronic obstructive pulmonary disease) ([place]) 01/21/2016  . OSA (obstructive sleep apnea) 10/23/2015  . Upper airway cough syndrome 10/23/2015  . Exertional dyspnea 10/23/2015  . RLS (restless legs syndrome) 10/23/2015  . Osteoarthritis of knee 06/04/2010  . Dizziness 06/04/2010  . Benign hypertensive heart disease without heart failure 06/04/2010  . Hypercholesterolemia 06/04/2010  . BPH (benign prostatic hyperplasia) 06/04/2010    Past Surgical History:  Procedure Laterality Date  . APPENDECTOMY    . EYE SURGERY     bil cataracts  . HEMORROIDECTOMY    . TONSILLECTOMY    . TOTAL KNEE ARTHROPLASTY Left 11/08/2016   Procedure: LEFT TOTAL KNEE ARTHROPLASTY;  Surgeon: Gaynelle Arabian, MD;  Location: [facility];  Service: Orthopedics;  Laterality: Left;  Adductor Block       Home Medications    Prior to Admission medications   Medication Sig Start Date End Date Taking? Authorizing Provider  acetaminophen (TYLENOL) 325 MG tablet Take 2 tablets (650 mg total) by mouth every 6 (six) hours as needed for mild pain (or Fever >/= 101). 11/12/16   Perkins, Alexzandrew L, PA-C  albuterol (PROVENTIL HFA;VENTOLIN HFA) 108 (90 Base) MCG/ACT inhaler Inhale 2 puffs into the lungs  every 4 (four) hours as needed for wheezing or shortness of breath. 01/21/16   de Dios, Cornell A, MD  artificial tears (LACRILUBE) OINT ophthalmic ointment Place into both eyes daily. 11/13/16   Perkins, Alexzandrew L, PA-C  bisacodyl (DULCOLAX) 10 MG suppository Place 1 suppository (10 mg total) rectally daily as needed for moderate constipation. 11/12/16   Perkins, Alexzandrew L, PA-C  diphenhydrAMINE (BENADRYL) 12.5 MG/5ML elixir Take 5-10 mLs (12.5-25 mg total) by mouth every 4 (four) hours as needed for itching. Patient not taking: Reported on 11/21/2016 11/12/16   Dara Lords, Alexzandrew L, PA-C  docusate sodium  (COLACE) 100 MG capsule Take 1 capsule (100 mg total) by mouth 2 (two) times daily. 11/12/16   Perkins, Alexzandrew L, PA-C  fluticasone (FLONASE) 50 MCG/ACT nasal spray Place 1 spray into both nostrils daily as needed for allergies.     [provider]  fluticasone furoate-vilanterol (BREO ELLIPTA) 100-25 MCG/INH AEPB Inhale 1 puff into the lungs daily. Patient taking differently: Inhale 1 puff into the lungs at bedtime.  08/20/16   Parrett, Fonnie Mu, NP  gabapentin (NEURONTIN) 300 MG capsule Take 300 mg by mouth at bedtime.    [provider]  iron polysaccharides (NIFEREX) 150 MG capsule Take 150 mg by mouth daily.    [provider]  levothyroxine (SYNTHROID, LEVOTHROID) 112 MCG tablet Take 112 mcg by mouth daily before breakfast.  10/14/14   [provider]  loratadine (CLARITIN) 10 MG tablet Take 10 mg by mouth daily.    [provider]  megestrol (MEGACE) 20 MG tablet Take 40 mg by mouth daily before breakfast.    [provider]  montelukast (SINGULAIR) 10 MG tablet Take 1 tablet (10 mg total) by mouth at bedtime. 09/07/16   Parrett, Fonnie Mu, NP  MYRBETRIQ 50 MG TB24 tablet Take 50 mg by mouth daily.  06/03/15   [provider]  omeprazole (PRILOSEC) 20 MG capsule Take 20 mg by mouth 2 (two) times daily before a meal.     [provider]  oxyCODONE (OXY IR/ROXICODONE) 5 MG immediate release tablet Take 1-2 tablets (5-10 mg total) by mouth every 4 (four) hours as needed for moderate pain or severe pain. Patient not taking: Reported on 11/25/2016 11/12/16   Dara Lords, Alexzandrew L, PA-C  Polyethyl Glycol-Propyl Glycol (SYSTANE OP) Apply 1 drop to eye daily.    [provider]  polyethylene glycol (MIRALAX / GLYCOLAX) packet Take 17 g by mouth daily as needed for mild constipation.    [provider]  propranolol (INDERAL) 20 MG tablet Take 20 mg by mouth daily.  06/18/16   [provider]  rivaroxaban  (XARELTO) 10 MG TABS tablet Take 10 mg by mouth daily. Until 10/7, then resume Aspirin 81mg  daily    [provider]  rosuvastatin (CRESTOR) 5 MG tablet Take 5 mg by mouth at bedtime.  06/23/16   [provider]  sodium phosphate (FLEET) 7-19 GM/118ML ENEM Place 133 mLs (1 enema total) rectally once as needed for severe constipation. 11/12/16   Perkins, Alexzandrew L, PA-C  solifenacin (VESICARE) 5 MG tablet Take 5 mg by mouth every evening.    [provider]  traMADol (ULTRAM) 50 MG tablet Take 1-2 tablets (50-100 mg total) by mouth every 6 (six) hours as needed for moderate pain. Patient not taking: Reported on 11/25/2016 11/12/16   Joelene Millin, PA-C    Family History Family History  Problem Relation Age of Onset  . Heart  attack Father   . Colon cancer Mother   . Colon cancer Sister     Social History Social History  Substance Use Topics  . Smoking status: Former Smoker    Packs/day: 2.00    Years: 18.00    Types: Cigarettes    Quit date: 06/04/1970  . Smokeless tobacco: Never Used  . Alcohol use 0.0 oz/week     Comment: special occasions     Allergies   Tizanidine   Review of Systems Review of Systems  Constitutional: Negative for fever.  Respiratory: Negative for shortness of breath.   Cardiovascular: Positive for leg swelling. Negative for chest pain.  Gastrointestinal: Negative for abdominal pain, nausea and vomiting.  Musculoskeletal: Negative for arthralgias.  Neurological: Positive for syncope. Negative for speech difficulty, weakness, light-headedness and headaches.  All other systems reviewed and are negative.    Physical Exam Updated Vital Signs BP 120/68   Pulse 84   Temp (!) 97.5 F (36.4 C) (Oral)   Resp 13   Ht 5\' 10"  (1.778 m)   Wt 86.2 kg (190 lb)   SpO2 99%   BMI 27.26 kg/m   Physical Exam  Constitutional: He is oriented to person, place, and time. He appears well-developed and well-nourished. No distress.   Mild pallor  HENT:  Head: Normocephalic and atraumatic.  Eyes: Pupils are equal, round, and reactive to light. Conjunctivae are normal. Right eye exhibits no discharge. Left eye exhibits no discharge. No scleral icterus.  Neck: Normal range of motion.  Cardiovascular: Normal rate and regular rhythm.  Exam reveals no gallop and no friction rub.   No murmur heard. Pulmonary/Chest: Effort normal and breath sounds normal. No respiratory distress. He has no wheezes. He has no rales. He exhibits no tenderness.  Abdominal: Soft. Bowel sounds are normal. He exhibits no distension and no mass. There is no tenderness. There is no rebound and no guarding. No hernia.  Musculoskeletal:  Left knee has moderate effusion. No tenderness of calf or knee.  Neurological: He is alert and oriented to person, place, and time.  Skin: Skin is warm and dry.  Psychiatric: He has a normal mood and affect. His behavior is normal.  Nursing note and vitals reviewed.   ED Treatments / Results  Labs (all labs ordered are listed, but only abnormal results are displayed) Labs Reviewed  BASIC METABOLIC PANEL - Abnormal; Notable for the following:       Result Value   Sodium 132 (*)    CO2 21 (*)    Glucose, Bld 112 (*)    BUN 26 (*)    Creatinine, Ser 1.34 (*)    GFR calc non Af Amer 46 (*)    GFR calc Af Amer 54 (*)    All other components within normal limits  CBC - Abnormal; Notable for the following:    RBC 3.15 (*)    Hemoglobin 9.4 (*)    HCT 28.5 (*)    All other components within normal limits  D-DIMER, QUANTITATIVE (NOT AT [facility]) - Abnormal; Notable for the following:    D-Dimer, Quant 4.88 (*)    All other components within normal limits  URINALYSIS, ROUTINE W REFLEX MICROSCOPIC  CBG MONITORING, ED  I-STAT TROPONIN, ED    EKG  EKG Interpretation  Date/Time:  Saturday December 18 2016 11:16:05 EDT Ventricular Rate:  85 PR Interval:    QRS Duration: 84 QT Interval:  382 QTC  Calculation: 455 R Axis:   30 Text Interpretation:  Sinus rhythm Low voltage, extremity leads Confirmed by Davonna Belling [id number]) on 12/18/2016 1:25:50 PM       Radiology Ct Angio Chest Pe W/cm &/or Wo Cm  Result Date: 12/18/2016 CLINICAL DATA:  Syncopal episode this morning while going to the bathroom. Elevated D-dimer. EXAM: CT ANGIOGRAPHY CHEST WITH CONTRAST TECHNIQUE: Multidetector CT imaging of the chest was performed using the standard protocol during bolus administration of intravenous contrast. Multiplanar CT image reconstructions and MIPs were obtained to evaluate the vascular anatomy. CONTRAST:  780 mL of Isovue 370 IV. COMPARISON:  08/30/2009 FINDINGS: Cardiovascular: Heart is normal in size. Mild calcified plaque over the left anterior descending coronary artery. Mild calcified plaque over the thoracic aorta. No evidence of pulmonary embolism. Mediastinum/Nodes: No evidence of mediastinal or hilar adenopathy. Remaining mediastinal structures are within normal. Lungs/Pleura: Lungs are adequately inflated and demonstrate mild paraseptal and centrilobular emphysematous disease. There is mild posterior dependent atelectatic change. There is minimal peripheral linear opacification over the lateral left mid to upper lung likely atelectasis. There is subtle associated peripheral increased interstitial markings over the lateral left midlung. Upper Abdomen: Within normal. Musculoskeletal: Degenerative change of the spine. Review of the MIP images confirms the above findings. IMPRESSION: No evidence of pulmonary embolism. Subtle posterior dependent atelectasis with emphysematous disease. Minimal scarring/atelectasis over the lateral left mid to upper lung. Mild atherosclerotic coronary artery disease. Aortic Atherosclerosis (ICD10-I70.0) and Emphysema (ICD10-J43.9). Electronically Signed   By: Marin Olp M.D.   On: 12/18/2016 16:05    Procedures Procedures (including critical care  time)  Medications Ordered in ED Medications  sodium chloride 0.9 % bolus 1,000 mL (1,000 mLs Intravenous New Bag/Given 12/18/16 1414)  iopamidol (ISOVUE-370) 76 % injection (100 mLs Intravenous Contrast Given 12/18/16 1517)     Initial Impression / Assessment and Plan / ED Course  I have reviewed the triage vital signs and the nursing notes.  Pertinent labs & imaging results that were available during my care of the patient were reviewed by me and considered in my medical decision making (see chart for details).  83 year old male presents with syncopal episode. Vital signs are normal. He denies any symptoms. CBC remarkable for hgb of 9.4 which is improved from a month ago. BMP remarkable for mild hyponatremia, low CO2, mild AKI. UA is clean. Troponin is 0. D-dimer is 4.88 therefore CTA was ordered which is reassuring. 1L NS given with improvement of BP. Syncopal episode is likely vasovagal from straining on toilet with possibly anemia and mild dehydration contributing. Discussed results with patient and wife. Advised f/u with PCP. Return precautions given.  Final Clinical Impressions(s) / ED Diagnoses   Final diagnoses:  AKI (acute kidney injury) ([place])  Syncope, unspecified syncope type  Anemia, unspecified type    New Prescriptions New Prescriptions   No medications on file     Iris Pert 12/18/16 [place], Nathan, MD 12/19/16 [phone]

## 2016-12-18 NOTE — ED Notes (Signed)
Patient transported to CT 

## 2016-12-18 NOTE — Discharge Instructions (Signed)
Please follow up with your doctor °Return for worsening symptoms °

## 2016-12-18 NOTE — ED Triage Notes (Signed)
Pt arrives from home via [facility] reporting LOC while toileting, EMS reports lasted approx 5 minutes. Initial BP reported 30 palpable, pt AOx3 at this time, normotensive, denies CP, SOB, weakness. No focal deficits noted.

## 2016-12-20 DIAGNOSIS — M25562 Pain in left knee: Secondary | ICD-10-CM | POA: Diagnosis not present

## 2016-12-21 DIAGNOSIS — N3941 Urge incontinence: Secondary | ICD-10-CM | POA: Diagnosis not present

## 2016-12-21 DIAGNOSIS — R21 Rash and other nonspecific skin eruption: Secondary | ICD-10-CM | POA: Diagnosis not present

## 2016-12-21 DIAGNOSIS — Z6825 Body mass index (BMI) 25.0-25.9, adult: Secondary | ICD-10-CM | POA: Diagnosis not present

## 2016-12-21 DIAGNOSIS — R55 Syncope and collapse: Secondary | ICD-10-CM | POA: Diagnosis not present

## 2016-12-21 DIAGNOSIS — R350 Frequency of micturition: Secondary | ICD-10-CM | POA: Diagnosis not present

## 2016-12-21 DIAGNOSIS — K5900 Constipation, unspecified: Secondary | ICD-10-CM | POA: Diagnosis not present

## 2016-12-22 DIAGNOSIS — M25562 Pain in left knee: Secondary | ICD-10-CM | POA: Diagnosis not present

## 2016-12-24 DIAGNOSIS — M25562 Pain in left knee: Secondary | ICD-10-CM | POA: Diagnosis not present

## 2016-12-28 DIAGNOSIS — M25562 Pain in left knee: Secondary | ICD-10-CM | POA: Diagnosis not present

## 2016-12-30 DIAGNOSIS — M25562 Pain in left knee: Secondary | ICD-10-CM | POA: Diagnosis not present

## 2017-01-04 DIAGNOSIS — M25562 Pain in left knee: Secondary | ICD-10-CM | POA: Diagnosis not present

## 2017-01-11 DIAGNOSIS — R350 Frequency of micturition: Secondary | ICD-10-CM | POA: Diagnosis not present

## 2017-01-11 DIAGNOSIS — M25562 Pain in left knee: Secondary | ICD-10-CM | POA: Diagnosis not present

## 2017-01-11 DIAGNOSIS — N3941 Urge incontinence: Secondary | ICD-10-CM | POA: Diagnosis not present

## 2017-01-13 ENCOUNTER — Encounter (HOSPITAL_COMMUNITY): Payer: Self-pay | Admitting: *Deleted

## 2017-01-13 ENCOUNTER — Emergency Department (HOSPITAL_COMMUNITY): Payer: MEDICARE

## 2017-01-13 ENCOUNTER — Other Ambulatory Visit: Payer: Self-pay

## 2017-01-13 ENCOUNTER — Inpatient Hospital Stay (HOSPITAL_COMMUNITY)
Admission: EM | Admit: 2017-01-13 | Discharge: 2017-01-15 | DRG: 312 | Disposition: A | Discharge status: Home / Self Care | Payer: MEDICARE | Attending: Internal Medicine | Admitting: Internal Medicine

## 2017-01-13 DIAGNOSIS — J449 Chronic obstructive pulmonary disease, unspecified: Secondary | ICD-10-CM | POA: Diagnosis not present

## 2017-01-13 DIAGNOSIS — E039 Hypothyroidism, unspecified: Secondary | ICD-10-CM | POA: Diagnosis not present

## 2017-01-13 DIAGNOSIS — K219 Gastro-esophageal reflux disease without esophagitis: Secondary | ICD-10-CM | POA: Diagnosis present

## 2017-01-13 DIAGNOSIS — G250 Essential tremor: Secondary | ICD-10-CM | POA: Diagnosis present

## 2017-01-13 DIAGNOSIS — R404 Transient alteration of awareness: Secondary | ICD-10-CM | POA: Diagnosis not present

## 2017-01-13 DIAGNOSIS — I10 Essential (primary) hypertension: Secondary | ICD-10-CM | POA: Diagnosis present

## 2017-01-13 DIAGNOSIS — N401 Enlarged prostate with lower urinary tract symptoms: Secondary | ICD-10-CM

## 2017-01-13 DIAGNOSIS — Z96652 Presence of left artificial knee joint: Secondary | ICD-10-CM | POA: Diagnosis present

## 2017-01-13 DIAGNOSIS — M1990 Unspecified osteoarthritis, unspecified site: Secondary | ICD-10-CM | POA: Diagnosis present

## 2017-01-13 DIAGNOSIS — I1310 Hypertensive heart and chronic kidney disease without heart failure, with stage 1 through stage 4 chronic kidney disease, or unspecified chronic kidney disease: Secondary | ICD-10-CM | POA: Diagnosis present

## 2017-01-13 DIAGNOSIS — Z87891 Personal history of nicotine dependence: Secondary | ICD-10-CM

## 2017-01-13 DIAGNOSIS — J439 Emphysema, unspecified: Secondary | ICD-10-CM

## 2017-01-13 DIAGNOSIS — G4733 Obstructive sleep apnea (adult) (pediatric): Secondary | ICD-10-CM | POA: Diagnosis not present

## 2017-01-13 DIAGNOSIS — I119 Hypertensive heart disease without heart failure: Secondary | ICD-10-CM | POA: Diagnosis not present

## 2017-01-13 DIAGNOSIS — R55 Syncope and collapse: Secondary | ICD-10-CM

## 2017-01-13 DIAGNOSIS — E785 Hyperlipidemia, unspecified: Secondary | ICD-10-CM | POA: Diagnosis present

## 2017-01-13 DIAGNOSIS — I700 Atherosclerosis of aorta: Secondary | ICD-10-CM | POA: Diagnosis present

## 2017-01-13 DIAGNOSIS — N400 Enlarged prostate without lower urinary tract symptoms: Secondary | ICD-10-CM | POA: Diagnosis present

## 2017-01-13 DIAGNOSIS — Z888 Allergy status to other drugs, medicaments and biological substances status: Secondary | ICD-10-CM

## 2017-01-13 DIAGNOSIS — R7989 Other specified abnormal findings of blood chemistry: Secondary | ICD-10-CM | POA: Diagnosis present

## 2017-01-13 DIAGNOSIS — E871 Hypo-osmolality and hyponatremia: Secondary | ICD-10-CM | POA: Diagnosis not present

## 2017-01-13 DIAGNOSIS — Z9989 Dependence on other enabling machines and devices: Secondary | ICD-10-CM

## 2017-01-13 DIAGNOSIS — N183 Chronic kidney disease, stage 3 unspecified: Secondary | ICD-10-CM | POA: Diagnosis present

## 2017-01-13 DIAGNOSIS — E860 Dehydration: Secondary | ICD-10-CM | POA: Diagnosis present

## 2017-01-13 DIAGNOSIS — E7800 Pure hypercholesterolemia, unspecified: Secondary | ICD-10-CM | POA: Diagnosis present

## 2017-01-13 DIAGNOSIS — Z79899 Other long term (current) drug therapy: Secondary | ICD-10-CM

## 2017-01-13 DIAGNOSIS — I493 Ventricular premature depolarization: Secondary | ICD-10-CM | POA: Diagnosis present

## 2017-01-13 DIAGNOSIS — R42 Dizziness and giddiness: Secondary | ICD-10-CM | POA: Diagnosis not present

## 2017-01-13 HISTORY — DX: Syncope and collapse: R55

## 2017-01-13 LAB — CBC WITH DIFFERENTIAL/PLATELET
BASOS ABS: 0.1 10*3/uL (ref 0.0–0.1)
BASOS PCT: 2 %
Eosinophils Absolute: 0.6 10*3/uL (ref 0.0–0.7)
Eosinophils Relative: 9 %
HEMATOCRIT: 32.9 % — AB (ref 39.0–52.0)
Hemoglobin: 11.2 g/dL — ABNORMAL LOW (ref 13.0–17.0)
Lymphocytes Relative: 29 %
Lymphs Abs: 2.1 10*3/uL (ref 0.7–4.0)
MCH: 31.1 pg (ref 26.0–34.0)
MCHC: 34 g/dL (ref 30.0–36.0)
MCV: 91.4 fL (ref 78.0–100.0)
MONOS PCT: 13 %
Monocytes Absolute: 0.9 10*3/uL (ref 0.1–1.0)
NEUTROS ABS: 3.4 10*3/uL (ref 1.7–7.7)
NEUTROS PCT: 47 %
Platelets: 248 10*3/uL (ref 150–400)
RBC: 3.6 MIL/uL — AB (ref 4.22–5.81)
RDW: 14.7 % (ref 11.5–15.5)
WBC: 7.2 10*3/uL (ref 4.0–10.5)

## 2017-01-13 LAB — COMPREHENSIVE METABOLIC PANEL
ALBUMIN: 3.6 g/dL (ref 3.5–5.0)
ALK PHOS: 62 U/L (ref 38–126)
ALT: 12 U/L — ABNORMAL LOW (ref 17–63)
AST: 17 U/L (ref 15–41)
Anion gap: 6 (ref 5–15)
BILIRUBIN TOTAL: 0.7 mg/dL (ref 0.3–1.2)
BUN: 23 mg/dL — AB (ref 6–20)
CALCIUM: 9.2 mg/dL (ref 8.9–10.3)
CO2: 23 mmol/L (ref 22–32)
Chloride: 103 mmol/L (ref 101–111)
Creatinine, Ser: 1.32 mg/dL — ABNORMAL HIGH (ref 0.61–1.24)
GFR calc Af Amer: 55 mL/min — ABNORMAL LOW (ref >60)
GFR calc non Af Amer: 47 mL/min — ABNORMAL LOW (ref >60)
GLUCOSE: 88 mg/dL (ref 65–99)
Potassium: 4.4 mmol/L (ref 3.5–5.1)
Sodium: 132 mmol/L — ABNORMAL LOW (ref 135–145)
TOTAL PROTEIN: 6.4 g/dL — AB (ref 6.5–8.1)

## 2017-01-13 LAB — TROPONIN I
TROPONIN I: 0.03 ng/mL — AB (ref <0.03)
TROPONIN I: 0.03 ng/mL — AB (ref <0.03)
Troponin I: 0.03 ng/mL (ref <0.03)

## 2017-01-13 LAB — CBG MONITORING, ED: Glucose-Capillary: 90 mg/dL (ref 65–99)

## 2017-01-13 LAB — TSH: TSH: 1.317 u[IU]/mL (ref 0.350–4.500)

## 2017-01-13 MED ORDER — MEGESTROL ACETATE 20 MG PO TABS
40.0000 mg | ORAL_TABLET | Freq: Every day | ORAL | Status: DC
  Administered 2017-01-14 – 2017-01-15 (×2): 40 mg via ORAL
  Filled 2017-01-13 (×2): qty 2

## 2017-01-13 MED ORDER — FLUTICASONE FUROATE-VILANTEROL 100-25 MCG/INH IN AEPB
1.0000 | INHALATION_SPRAY | Freq: Every day | RESPIRATORY_TRACT | Status: DC
  Administered 2017-01-13 – 2017-01-15 (×3): 1 via RESPIRATORY_TRACT
  Filled 2017-01-13: qty 28

## 2017-01-13 MED ORDER — FLUTICASONE PROPIONATE 50 MCG/ACT NA SUSP
1.0000 | Freq: Every day | NASAL | Status: DC | PRN
  Filled 2017-01-13: qty 16

## 2017-01-13 MED ORDER — SODIUM CHLORIDE 0.9 % IV SOLN
INTRAVENOUS | Status: DC
  Administered 2017-01-13: 11:00:00 via INTRAVENOUS

## 2017-01-13 MED ORDER — ONDANSETRON HCL 4 MG/2ML IJ SOLN: 4.0000 mg | Freq: Four times a day (QID) | INTRAMUSCULAR | Status: DC | PRN

## 2017-01-13 MED ORDER — LEVOTHYROXINE SODIUM 112 MCG PO TABS
112.0000 ug | ORAL_TABLET | Freq: Every day | ORAL | Status: DC
  Administered 2017-01-14 – 2017-01-15 (×2): 112 ug via ORAL
  Filled 2017-01-13 (×2): qty 1

## 2017-01-13 MED ORDER — ASPIRIN 81 MG PO CHEW
81.0000 mg | CHEWABLE_TABLET | Freq: Every day | ORAL | Status: DC
  Administered 2017-01-13 – 2017-01-15 (×3): 81 mg via ORAL
  Filled 2017-01-13 (×3): qty 1

## 2017-01-13 MED ORDER — SODIUM CHLORIDE 0.9 % IV SOLN
INTRAVENOUS | Status: AC
  Administered 2017-01-13 – 2017-01-14 (×2): via INTRAVENOUS

## 2017-01-13 MED ORDER — LORATADINE 10 MG PO TABS: 10.0000 mg | ORAL_TABLET | Freq: Every day | ORAL | Status: DC | PRN

## 2017-01-13 MED ORDER — POLYETHYL GLYCOL-PROPYL GLYCOL 0.4-0.3 % OP GEL
Freq: Every day | OPHTHALMIC | Status: DC | PRN
  Filled 2017-01-13: qty 10

## 2017-01-13 MED ORDER — ROSUVASTATIN CALCIUM 10 MG PO TABS
5.0000 mg | ORAL_TABLET | Freq: Every day | ORAL | Status: DC
  Administered 2017-01-13 – 2017-01-14 (×2): 5 mg via ORAL
  Filled 2017-01-13 (×2): qty 1

## 2017-01-13 MED ORDER — DARIFENACIN HYDROBROMIDE ER 7.5 MG PO TB24
7.5000 mg | ORAL_TABLET | Freq: Every day | ORAL | Status: DC
  Administered 2017-01-13 – 2017-01-15 (×3): 7.5 mg via ORAL
  Filled 2017-01-13 (×4): qty 1

## 2017-01-13 MED ORDER — ONDANSETRON HCL 4 MG PO TABS: 4.0000 mg | ORAL_TABLET | Freq: Four times a day (QID) | ORAL | Status: DC | PRN

## 2017-01-13 MED ORDER — POLYSACCHARIDE IRON COMPLEX 150 MG PO CAPS
150.0000 mg | ORAL_CAPSULE | Freq: Every day | ORAL | Status: DC
  Administered 2017-01-14 – 2017-01-15 (×2): 150 mg via ORAL
  Filled 2017-01-13 (×2): qty 1

## 2017-01-13 MED ORDER — ALBUTEROL SULFATE (2.5 MG/3ML) 0.083% IN NEBU: 2.5000 mg | INHALATION_SOLUTION | RESPIRATORY_TRACT | Status: DC | PRN

## 2017-01-13 MED ORDER — ACETAMINOPHEN 325 MG PO TABS: 650.0000 mg | ORAL_TABLET | Freq: Four times a day (QID) | ORAL | Status: DC | PRN

## 2017-01-13 MED ORDER — PROPRANOLOL HCL 20 MG PO TABS
20.0000 mg | ORAL_TABLET | Freq: Every day | ORAL | Status: DC
  Administered 2017-01-14 – 2017-01-15 (×2): 20 mg via ORAL
  Filled 2017-01-13 (×2): qty 1

## 2017-01-13 MED ORDER — ACETAMINOPHEN 650 MG RE SUPP: 650.0000 mg | Freq: Four times a day (QID) | RECTAL | Status: DC | PRN

## 2017-01-13 MED ORDER — POLYETHYLENE GLYCOL 3350 17 G PO PACK: 17.0000 g | PACK | Freq: Every day | ORAL | Status: DC | PRN

## 2017-01-13 MED ORDER — MONTELUKAST SODIUM 10 MG PO TABS
10.0000 mg | ORAL_TABLET | Freq: Every day | ORAL | Status: DC
  Administered 2017-01-13 – 2017-01-14 (×2): 10 mg via ORAL
  Filled 2017-01-13 (×2): qty 1

## 2017-01-13 MED ORDER — MEGESTROL ACETATE 20 MG PO TABS
20.0000 mg | ORAL_TABLET | Freq: Every day | ORAL | Status: DC
  Administered 2017-01-13 – 2017-01-14 (×2): 20 mg via ORAL
  Filled 2017-01-13 (×3): qty 1

## 2017-01-13 MED ORDER — GABAPENTIN 100 MG PO CAPS
100.0000 mg | ORAL_CAPSULE | Freq: Every day | ORAL | Status: DC
  Administered 2017-01-13 – 2017-01-14 (×2): 100 mg via ORAL
  Filled 2017-01-13 (×2): qty 1

## 2017-01-13 MED ORDER — PANTOPRAZOLE SODIUM 40 MG PO TBEC
40.0000 mg | DELAYED_RELEASE_TABLET | Freq: Every day | ORAL | Status: DC
  Administered 2017-01-14 – 2017-01-15 (×2): 40 mg via ORAL
  Filled 2017-01-13 (×2): qty 1

## 2017-01-13 MED ORDER — MIRABEGRON ER 25 MG PO TB24
50.0000 mg | ORAL_TABLET | Freq: Every day | ORAL | Status: DC
  Administered 2017-01-14 – 2017-01-15 (×2): 50 mg via ORAL
  Filled 2017-01-13 (×2): qty 2

## 2017-01-13 MED ORDER — POLYVINYL ALCOHOL 1.4 % OP SOLN
1.0000 [drp] | OPHTHALMIC | Status: DC | PRN
  Filled 2017-01-13: qty 15

## 2017-01-13 NOTE — Plan of Care (Signed)
Pt educated to call for assistance if he needs to get out of bed. Pt verbalized understanding. Call bell is in reach and bed alarm is set.

## 2017-01-13 NOTE — ED Triage Notes (Signed)
Pt arrived by [facility] from home. Pt was eating breakfast and became weak, had near syncopal episode and lowered onto the ground. Denies cp or sob.

## 2017-01-13 NOTE — Progress Notes (Signed)
Patient admitted to [phone] from ED via stretcher.  Patient ambulated to bed using walker and standby RN assist.  Bed in low position, wheels locked.  Patient denies chest pain/shortness of breath.  Telemetry monitor applied.  Patient oriented to environment, including call bell, TV, meal times, and hourly rounding. Will continue to monitor.

## 2017-01-13 NOTE — H&P (Signed)
History and Physical:    Randy Sullivan   NLG:[id number] DOB: Sep 25, 1928 DOA: 01/13/2017  Referring MD/provider: Dr. Vanita Panda PCP: Prince Solian, MD   Patient coming from: Home  Chief Complaint: Syncope  History of Present Illness:   Randy Sullivan is an 83 y.o. male with a PMH of OSA on CPAP, hypertension, hypothyroidism, hyperlipidemia and BPH who was brought to the hospital via EMS for an episode of unresponsiveness. Patient's wife provides most of the history as the patient does not have any recollection of the events. According to her, he was sitting at the breakfast table when he became unresponsive to her. She was unable to rouse him. He subsequently called EMS and the patient came to. She thinks that he may have been unresponsive for approximately 10 minutes. Denied any associated chest pain, prodromal dizziness, significant shortness of breath (although the patient's wife does say he complained of shortness of breath earlier in the day). Patient's wife says this is his third episode of passing out. The first episode occurred when he was undergoing rehabilitation after a TKR and it was attributed to a reaction to temazepam. The second episode occurred when he was sitting on the commode and was felt to be vasovagal in etiology. Patient has not had any history of heart problems. He had a negative stress test in 2016, and an 2-D echo 01/01/16 which showed EF 55-60 percent with no regional wall motion abnormalities and grade 1 diastolic dysfunction.  ED Course:  Patient was noted to have mildly elevated troponin 0.03 and therefore was referred for observation overnight.  ROS:   Review of Systems  Constitutional: Negative for chills and fever.  Respiratory: Positive for shortness of breath. Negative for cough and wheezing.   Cardiovascular: Negative for chest pain and palpitations.  Gastrointestinal: Negative for abdominal pain, blood in stool, diarrhea, melena, nausea and  vomiting.  Genitourinary: Positive for dysuria.  Musculoskeletal: Positive for falls and joint pain.  Neurological: Positive for weakness. Negative for dizziness.    Past Medical History:   Past Medical History:  Diagnosis Date  . BPH (benign prostatic hyperplasia)   . Cancer ([place])    non melanoma skin cancers removed  . DJD (degenerative joint disease)   . Familial tremor   . GERD (gastroesophageal reflux disease)   . Heart murmur    hx of  . Hyperlipidemia   . Hypertension   . Hypothyroidism   . Mood disorder ([place])   . Osteoarthritis   . Prostatism   . Shortness of breath    mild with excertion  . Sleep apnea    cpap  . Thyroid disease     Past Surgical History:   Past Surgical History:  Procedure Laterality Date  . APPENDECTOMY    . EYE SURGERY     bil cataracts  . HEMORROIDECTOMY    . TONSILLECTOMY    . TOTAL KNEE ARTHROPLASTY Left 11/08/2016   Procedure: LEFT TOTAL KNEE ARTHROPLASTY;  Surgeon: Gaynelle Arabian, MD;  Location: [facility];  Service: Orthopedics;  Laterality: Left;  Adductor Block    Social History:   Social History   Socioeconomic History  . Marital status: Married    Spouse name: Not on file  . Number of children: Not on file  . Years of education: Not on file  . Highest education level: Not on file  Social Needs  . Financial resource strain: Not on file  . Food insecurity - worry: Not on file  .  Food insecurity - inability: Not on file  . Transportation needs - medical: Not on file  . Transportation needs - non-medical: Not on file  Occupational History  . Occupation: retired  Tobacco Use  . Smoking status: Former Smoker    Packs/day: 2.00    Years: 18.00    Pack years: 36.00    Types: Cigarettes    Last attempt to quit: 06/04/1970    Years since quitting: 46.6  . Smokeless tobacco: Never Used  Substance and Sexual Activity  . Alcohol use: Yes    Alcohol/week: 0.0 oz    Comment: special occasions  . Drug use: No  . Sexual  activity: Not Currently  Other Topics Concern  . Not on file  Social History Narrative   Rare caffeine use     Allergies   Tizanidine  Family history:   Family History  Problem Relation Age of Onset  . Heart attack Father   . Colon cancer Mother   . Colon cancer Sister     Current Medications:   Prior to Admission medications   Medication Sig Start Date End Date Taking? Authorizing Provider  albuterol (PROVENTIL HFA;VENTOLIN HFA) 108 (90 Base) MCG/ACT inhaler Inhale 2 puffs into the lungs every 4 (four) hours as needed for wheezing or shortness of breath. 01/21/16  Yes [place], [place] A, MD  fluticasone [facility]) 50 MCG/ACT nasal spray Place 1 spray into both nostrils daily as needed for allergies.    Yes [provider]  fluticasone furoate-vilanterol (BREO ELLIPTA) 100-25 MCG/INH AEPB Inhale 1 puff into the lungs daily. Patient taking differently: Inhale 1 puff into the lungs at bedtime.  08/20/16  Yes Parrett, Tammy S, NP  gabapentin (NEURONTIN) 100 MG capsule Take 100 mg by mouth at bedtime. 10/28/16  Yes [provider]  hydrOXYzine (ATARAX/VISTARIL) 25 MG tablet Take 25 mg by mouth every 6 (six) hours as needed for itching.  12/30/16  Yes [provider]  iron polysaccharides (NIFEREX) 150 MG capsule Take 150 mg by mouth daily.   Yes [provider]  levothyroxine (SYNTHROID, LEVOTHROID) 112 MCG tablet Take 112 mcg by mouth daily before breakfast.  10/14/14  Yes [provider]  loratadine (CLARITIN) 10 MG tablet Take 10 mg by mouth daily as needed for allergies.    Yes [provider]  megestrol (MEGACE) 20 MG tablet Take 20-40 mg by mouth See admin instructions. Take 2 tablets (40mg ) before breakfast and 1 tablet (20mg ) before supper.   Yes [provider]  montelukast (SINGULAIR) 10 MG tablet Take 1 tablet (10 mg total) by mouth at bedtime. 09/07/16  Yes Parrett, Tammy S, NP  MYRBETRIQ 50 MG TB24 tablet Take 50  mg by mouth daily.  06/03/15  Yes [provider]  omeprazole (PRILOSEC) 20 MG capsule Take 20 mg by mouth 2 (two) times daily before a meal.    Yes [provider]  Polyethyl Glycol-Propyl Glycol (SYSTANE OP) Apply 1-2 drops to eye daily as needed (dry eyes).    Yes [provider]  polyethylene glycol (MIRALAX / GLYCOLAX) packet Take 17 g by mouth daily as needed for mild constipation.   Yes [provider]  propranolol (INDERAL) 20 MG tablet Take 20 mg by mouth daily.  06/18/16  Yes [provider]  rosuvastatin (CRESTOR) 5 MG tablet Take 5 mg by mouth at bedtime.  06/23/16  Yes [provider]  solifenacin (VESICARE) 5 MG tablet Take 5 mg by mouth every evening.  Yes [provider]    Physical Exam:   Vitals:   01/13/17 1300 01/13/17 1330 01/13/17 1400 01/13/17 1445  BP: (!) 112/57 (!) 118/54 125/61 113/75  Pulse: 68 71 71 74  Resp: 18 18 13 14   Temp:      TempSrc:      SpO2: 99% 100% 99% 100%  Weight:      Height:         Physical Exam: Blood pressure 113/75, pulse 74, temperature 98.3 F (36.8 C), temperature source Oral, resp. rate 14, height 6' (1.829 m), weight 86.2 kg (190 lb), SpO2 100 %. Gen: Elderly male who is in no acute distress.  Head: Normocephalic, atraumatic. Eyes: Pupils equal, round and reactive to light, lens implants bilaterally. Extraocular movements intact.  Sclerae nonicteric. No lid lag. Mouth: Oropharynx reveals normal findings. Dentition is fair. Neck: Supple, no thyromegaly, no lymphadenopathy, no jugular venous distention. No bruits. Chest: Lungs are clear to auscultation with good air movement. No rales, rhonchi or wheezes.  CV: Heart sounds are regular with an S1, S2. No murmurs, rubs, clicks, or gallops.  Abdomen: Soft, nontender, nondistended with normal active bowel sounds. No hepatosplenomegaly or palpable masses. Extremities: Extremities are without clubbing, or cyanosis. Trace  edema. Pedal pulses 2+. Skin: Warm and dry. No rashes, lesions or wounds. Neuro: Alert and oriented times 2; grossly nonfocal. Memory impaired. Psych: Insight is good and judgment is fair. Mood and affect normal.  Data Review:    Labs: Basic Metabolic Panel: Recent Labs  Lab 01/13/17 1135  NA 132*  K 4.4  CL 103  CO2 23  GLUCOSE 88  BUN 23*  CREATININE 1.32*  CALCIUM 9.2   Liver Function Tests: Recent Labs  Lab 01/13/17 1135  AST 17  ALT 12*  ALKPHOS 62  BILITOT 0.7  PROT 6.4*  ALBUMIN 3.6   No results for input(s): LIPASE, AMYLASE in the last 168 hours. No results for input(s): AMMONIA in the last 168 hours. CBC: Recent Labs  Lab 01/13/17 1045  WBC 7.2  NEUTROABS 3.4  HGB 11.2*  HCT 32.9*  MCV 91.4  PLT 248   Cardiac Enzymes: Recent Labs  Lab 01/13/17 1135  TROPONINI 0.03*    BNP (last 3 results) No results for input(s): PROBNP in the last 8760 hours. CBG: Recent Labs  Lab 01/13/17 1052  GLUCAP 90    Urinalysis    Component Value Date/Time   COLORURINE YELLOW 12/18/2016 1327   APPEARANCEUR CLEAR 12/18/2016 1327   LABSPEC 1.015 12/18/2016 1327   PHURINE 6.0 12/18/2016 1327   GLUCOSEU NEGATIVE 12/18/2016 1327   HGBUR NEGATIVE 12/18/2016 1327   BILIRUBINUR NEGATIVE 12/18/2016 1327   KETONESUR NEGATIVE 12/18/2016 1327   PROTEINUR NEGATIVE 12/18/2016 1327   NITRITE NEGATIVE 12/18/2016 1327   LEUKOCYTESUR NEGATIVE 12/18/2016 1327      Radiographic Studies: 01/13/17 Dg Chest 2 View: My independent review of the image shows: No acute findings. No pneumonia. Heart does not appear enlarged.  EKG: Independently reviewed. Sinus rhythm with PVCs. Ventricular rate 79 bpm with a QTC of 460. No obvious ST changes.   Assessment/Plan:   Principal Problem:   Syncope in a patient with OSA on CPAP/mildly elevated troponin/PVCs Unclear if the patient was truly syncopal or if he was merely asleep. He has known history of OSA on CPAP. Nevertheless,  he does have PVCs on his EKG and a troponin of 0.03, so we will observe him overnight on telemetry and cycle cardiac markers. Patient denies  chest pain. Will start aspirin. Check orthostatics. Continue CPAP at at bedtime. Consider outpatient event monitor given that this is his third episode of syncope.  Active Problems:   Benign hypertensive heart disease without heart failure/hypertension Blood pressure currently controlled. Continue propranolol.    Hypercholesterolemia Continue Crestor.    BPH (benign prostatic hyperplasia) Continue Vesicare and Myrbetriq.    COPD (chronic obstructive pulmonary disease) (HCC) Continue Breo-Ellipta, Singular, when necessary bronchodilators.    Thoracic aortic atherosclerosis (HCC) Add daily aspirin. Continue Crestor.    CKD (chronic kidney disease), stage III (HCC) Baseline creatinine appears to be around 1.07-1.2. Current creatinine 1.32, slightly higher. Monitor.   Other information:   DVT prophylaxis: SCDs ordered. Code Status: Full code. Family Communication: Wife at bedside. Disposition Plan: Home tomorrow if no obvious cause of syncope. Consults called: None. Admission status: Observation.   Margreta Journey Rama [facility] Pager [phone] Cell: [phone]   If 7PM-7AM, please contact night-coverage [email or web address] Password [facility] 01/13/2017, 3:03 PM

## 2017-01-13 NOTE — ED Provider Notes (Signed)
[facility] EMERGENCY DEPARTMENT Provider Note   CSN: [id number] Arrival date & time: 01/13/17  1041     History   Chief Complaint Chief Complaint  Patient presents with  . Loss of Consciousness    HPI Randy Sullivan is a 83 y.o. male.  HPI Patient presents after an episode of loss of consciousness. The patient is here with his wife who assists with the HPI, and witnessed the event. They note that he was in his usual state of health until the event occurred. No recent medication changes, diet changes beyond addition of Atarax. Patient was sitting at the breakfast table, when he had a non-prodromal event of loss of consciousness. This lasted several moments, and the patient was slightly slowed upon awakening, but not overtly confused. He denies pain either before or after the event, denies any discomfort or disorientation currently. He notes that he has had one prior similar event, though that occurred after a bowel movement, and was presumed to be due to vagal response.  Past Medical History:  Diagnosis Date  . BPH (benign prostatic hyperplasia)   . Cancer ([place])    non melanoma skin cancers removed  . DJD (degenerative joint disease)   . Familial tremor   . GERD (gastroesophageal reflux disease)   . Heart murmur    hx of  . Hyperlipidemia   . Hypertension   . Hypothyroidism   . Mood disorder ([place])   . Osteoarthritis   . Prostatism   . Shortness of breath    mild with excertion  . Sleep apnea    cpap  . Thyroid disease     Patient Active Problem List   Diagnosis Date Noted  . OA (osteoarthritis) of knee 11/08/2016  . Essential hypertension 02/05/2016  . Thoracic aortic atherosclerosis ([place]) 02/05/2016  . Benign familial tremor 02/05/2016  . COPD (chronic obstructive pulmonary disease) ([place]) 01/21/2016  . OSA (obstructive sleep apnea) 10/23/2015  . Upper airway cough syndrome 10/23/2015  . Exertional dyspnea 10/23/2015  . RLS (restless  legs syndrome) 10/23/2015  . Osteoarthritis of knee 06/04/2010  . Dizziness 06/04/2010  . Benign hypertensive heart disease without heart failure 06/04/2010  . Hypercholesterolemia 06/04/2010  . BPH (benign prostatic hyperplasia) 06/04/2010    Past Surgical History:  Procedure Laterality Date  . APPENDECTOMY    . EYE SURGERY     bil cataracts  . HEMORROIDECTOMY    . TONSILLECTOMY    . TOTAL KNEE ARTHROPLASTY Left 11/08/2016   Procedure: LEFT TOTAL KNEE ARTHROPLASTY;  Surgeon: Gaynelle Arabian, MD;  Location: [facility];  Service: Orthopedics;  Laterality: Left;  Adductor Block       Home Medications    Prior to Admission medications   Medication Sig Start Date End Date Taking? Authorizing Provider  acetaminophen (TYLENOL) 325 MG tablet Take 2 tablets (650 mg total) by mouth every 6 (six) hours as needed for mild pain (or Fever >/= 101). 11/12/16   Perkins, Alexzandrew L, PA-C  albuterol (PROVENTIL HFA;VENTOLIN HFA) 108 (90 Base) MCG/ACT inhaler Inhale 2 puffs into the lungs every 4 (four) hours as needed for wheezing or shortness of breath. 01/21/16   de Dios, Morrison A, MD  artificial tears (LACRILUBE) OINT ophthalmic ointment Place into both eyes daily. 11/13/16   Perkins, Alexzandrew L, PA-C  bisacodyl (DULCOLAX) 10 MG suppository Place 1 suppository (10 mg total) rectally daily as needed for moderate constipation. Patient not taking: Reported on 12/18/2016 11/12/16   Joelene Millin, PA-C  diphenhydrAMINE (BENADRYL) 12.5 MG/5ML elixir Take 5-10 mLs (12.5-25 mg total) by mouth every 4 (four) hours as needed for itching. Patient not taking: Reported on 11/21/2016 11/12/16   Dara Lords, Alexzandrew L, PA-C  docusate sodium (COLACE) 100 MG capsule Take 1 capsule (100 mg total) by mouth 2 (two) times daily. Patient not taking: Reported on 12/18/2016 11/12/16   Dara Lords, Alexzandrew L, PA-C  fluticasone (FLONASE) 50 MCG/ACT nasal spray Place 1 spray into both nostrils daily as needed for  allergies.     [provider]  fluticasone furoate-vilanterol (BREO ELLIPTA) 100-25 MCG/INH AEPB Inhale 1 puff into the lungs daily. Patient taking differently: Inhale 1 puff into the lungs at bedtime.  08/20/16   Parrett, Fonnie Mu, NP  gabapentin (NEURONTIN) 100 MG capsule Take 100 mg by mouth at bedtime. 10/28/16   [provider]  iron polysaccharides (NIFEREX) 150 MG capsule Take 150 mg by mouth daily.    [provider]  levothyroxine (SYNTHROID, LEVOTHROID) 112 MCG tablet Take 112 mcg by mouth daily before breakfast.  10/14/14   [provider]  loratadine (CLARITIN) 10 MG tablet Take 10 mg by mouth daily.    [provider]  megestrol (MEGACE) 20 MG tablet Take 40 mg by mouth daily before breakfast.    [provider]  montelukast (SINGULAIR) 10 MG tablet Take 1 tablet (10 mg total) by mouth at bedtime. 09/07/16   Parrett, Fonnie Mu, NP  MYRBETRIQ 50 MG TB24 tablet Take 50 mg by mouth daily.  06/03/15   [provider]  omeprazole (PRILOSEC) 20 MG capsule Take 20 mg by mouth 2 (two) times daily before a meal.     [provider]  oxyCODONE (OXY IR/ROXICODONE) 5 MG immediate release tablet Take 1-2 tablets (5-10 mg total) by mouth every 4 (four) hours as needed for moderate pain or severe pain. Patient not taking: Reported on 11/25/2016 11/12/16   Dara Lords, Alexzandrew L, PA-C  Polyethyl Glycol-Propyl Glycol (SYSTANE OP) Apply 1 drop to eye daily.    [provider]  polyethylene glycol (MIRALAX / GLYCOLAX) packet Take 17 g by mouth daily as needed for mild constipation.    [provider]  propranolol (INDERAL) 20 MG tablet Take 20 mg by mouth daily.  06/18/16   [provider]  rosuvastatin (CRESTOR) 5 MG tablet Take 5 mg by mouth at bedtime.  06/23/16   [provider]  sodium phosphate (FLEET) 7-19 GM/118ML ENEM Place 133 mLs (1 enema total) rectally once as needed for severe  constipation. Patient not taking: Reported on 12/18/2016 11/12/16   Dara Lords, Alexzandrew L, PA-C  solifenacin (VESICARE) 5 MG tablet Take 5 mg by mouth every evening.    [provider]  traMADol (ULTRAM) 50 MG tablet Take 1-2 tablets (50-100 mg total) by mouth every 6 (six) hours as needed for moderate pain. Patient not taking: Reported on 11/25/2016 11/12/16   Joelene Millin, PA-C    Family History Family History  Problem Relation Age of Onset  . Heart attack Father   . Colon cancer Mother   . Colon cancer Sister     Social History Social History   Tobacco Use  . Smoking status: Former Smoker    Packs/day: 2.00    Years: 18.00    Pack years: 36.00    Types: Cigarettes    Last attempt to quit: 06/04/1970    Years since quitting: 46.6  . Smokeless tobacco: Never Used  Substance Use Topics  . Alcohol  use: Yes    Alcohol/week: 0.0 oz    Comment: special occasions  . Drug use: No     Allergies   Tizanidine   Review of Systems Review of Systems  Constitutional:       Per HPI, otherwise negative  HENT:       Per HPI, otherwise negative  Respiratory:       Per HPI, otherwise negative  Cardiovascular:       Per HPI, otherwise negative  Gastrointestinal: Negative for vomiting.  Endocrine:       Negative aside from HPI  Genitourinary:       Neg aside from HPI   Musculoskeletal:       Per HPI, otherwise negative  Skin: Negative.   Neurological: Positive for syncope.     Physical Exam Updated Vital Signs BP (!) 170/87 (BP Location: Right Arm)   Pulse 81   Temp 98.3 F (36.8 C) (Oral)   Resp 18   Ht 6' (1.829 m)   Wt 86.2 kg (190 lb)   SpO2 96%   BMI 25.77 kg/m   Physical Exam  Constitutional: He is oriented to person, place, and time. He appears well-developed. No distress.  HENT:  Head: Normocephalic and atraumatic.  Eyes: Conjunctivae and EOM are normal.  Cardiovascular: Normal rate and regular rhythm.  Pulmonary/Chest: Effort  normal. No stridor. No respiratory distress.  Abdominal: He exhibits no distension.  Musculoskeletal: He exhibits no edema.  Neurological: He is alert and oriented to person, place, and time. He displays atrophy. He displays no tremor. He exhibits normal muscle tone. He displays no seizure activity.  Skin: Skin is warm and dry.  Psychiatric: He is slowed.  Nursing note and vitals reviewed.    ED Treatments / Results  Labs (all labs ordered are listed, but only abnormal results are displayed) Labs Reviewed  CBC WITH DIFFERENTIAL/PLATELET - Abnormal; Notable for the following components:      Result Value   RBC 3.60 (*)    Hemoglobin 11.2 (*)    HCT 32.9 (*)    All other components within normal limits  TROPONIN I - Abnormal; Notable for the following components:   Troponin I 0.03 (*)    All other components within normal limits  COMPREHENSIVE METABOLIC PANEL - Abnormal; Notable for the following components:   Sodium 132 (*)    BUN 23 (*)    Creatinine, Ser 1.32 (*)    Total Protein 6.4 (*)    ALT 12 (*)    GFR calc non Af Amer 47 (*)    GFR calc Af Amer 55 (*)    All other components within normal limits  CBG MONITORING, ED    EKG  EKG Interpretation  Date/Time:  Thursday January 13 2017 10:45:10 EST Ventricular Rate:  79 PR Interval:    QRS Duration: 95 QT Interval:  401 QTC Calculation: 460 R Axis:   67 Text Interpretation:  Sinus rhythm Multiple ventricular premature complexes Borderline low voltage, extremity leads Abnormal ekg Confirmed by Carmin Muskrat [phone]) on 01/13/2017 10:49:00 AM Also confirmed by Carmin Muskrat ([id number]), editor Laurena Spies [phone])  on 01/13/2017 12:37:24 PM       Radiology Dg Chest 2 View  Result Date: 01/13/2017 CLINICAL DATA:  Syncope EXAM: CHEST  2 VIEW COMPARISON:  11/21/2016 FINDINGS: Heart is normal in size. Lungs under aerated and grossly clear. No pneumothorax. No pleural effusion. Stable thoracic spine. IMPRESSION:  No active cardiopulmonary disease. Electronically Signed   By:  Marybelle Killings M.D.   On: 01/13/2017 11:33    Procedures Procedures (including critical care time)  Medications Ordered in ED Medications  0.9 %  sodium chloride infusion ( Intravenous New Bag/Given 01/13/17 1057)     Initial Impression / Assessment and Plan / ED Course  I have reviewed the triage vital signs and the nursing notes.  Pertinent labs & imaging results that were available during my care of the patient were reviewed by me and considered in my medical decision making (see chart for details).  Chart review notable for similar event within the past few months   1:07 PM After slight delay in obtaining labs due to hemolysis, remaining labs now available Patient does have numerically abnormal troponin value. This is distinct in that abnormality is new compared to evaluation 3 weeks ago. On repeat exam he is in no distress, states that he feels about the same. We discussed all values at length, and given his recurrent syncope, today's slight troponin abnormality, which may be due to metabolic demand versus dehydration as well, the patient will be admitted for further evaluation and management. Chart review indicates last echocardiogram was 1 year ago, results below.  Impressions:   - Normal LV size with moderate LV hypertrophy. EF 55-60%. Normal RV   size and systolic function. Nodular calcification towards the   tips of the aortic valve leaflets. This looks like calcification   rather than vegetation. No aortic stenosis or regurgitation.   Final Clinical Impressions(s) / ED Diagnoses  syncope   Carmin Muskrat, MD 01/13/17 1308

## 2017-01-13 NOTE — Plan of Care (Deleted)
Pt requestedto see video about Cardiac Cath. Patient provided with the phone number and instructions on how to queue up video when she is ready.

## 2017-01-13 NOTE — ED Notes (Signed)
Unsuccessful attempt at giving report.  

## 2017-01-13 NOTE — ED Notes (Signed)
On way to XR 

## 2017-01-14 ENCOUNTER — Observation Stay (HOSPITAL_BASED_OUTPATIENT_CLINIC_OR_DEPARTMENT_OTHER): Payer: MEDICARE

## 2017-01-14 ENCOUNTER — Observation Stay (HOSPITAL_COMMUNITY): Payer: MEDICARE

## 2017-01-14 DIAGNOSIS — Z96652 Presence of left artificial knee joint: Secondary | ICD-10-CM | POA: Diagnosis present

## 2017-01-14 DIAGNOSIS — N183 Chronic kidney disease, stage 3 (moderate): Secondary | ICD-10-CM | POA: Diagnosis not present

## 2017-01-14 DIAGNOSIS — I493 Ventricular premature depolarization: Secondary | ICD-10-CM | POA: Diagnosis present

## 2017-01-14 DIAGNOSIS — E860 Dehydration: Secondary | ICD-10-CM | POA: Diagnosis present

## 2017-01-14 DIAGNOSIS — I119 Hypertensive heart disease without heart failure: Secondary | ICD-10-CM | POA: Diagnosis not present

## 2017-01-14 DIAGNOSIS — R55 Syncope and collapse: Principal | ICD-10-CM

## 2017-01-14 DIAGNOSIS — E039 Hypothyroidism, unspecified: Secondary | ICD-10-CM | POA: Diagnosis present

## 2017-01-14 DIAGNOSIS — I361 Nonrheumatic tricuspid (valve) insufficiency: Secondary | ICD-10-CM

## 2017-01-14 DIAGNOSIS — I10 Essential (primary) hypertension: Secondary | ICD-10-CM | POA: Diagnosis not present

## 2017-01-14 DIAGNOSIS — M1990 Unspecified osteoarthritis, unspecified site: Secondary | ICD-10-CM | POA: Diagnosis present

## 2017-01-14 DIAGNOSIS — Z888 Allergy status to other drugs, medicaments and biological substances status: Secondary | ICD-10-CM | POA: Diagnosis not present

## 2017-01-14 DIAGNOSIS — Z9989 Dependence on other enabling machines and devices: Secondary | ICD-10-CM | POA: Diagnosis not present

## 2017-01-14 DIAGNOSIS — J449 Chronic obstructive pulmonary disease, unspecified: Secondary | ICD-10-CM | POA: Diagnosis present

## 2017-01-14 DIAGNOSIS — E7800 Pure hypercholesterolemia, unspecified: Secondary | ICD-10-CM | POA: Diagnosis not present

## 2017-01-14 DIAGNOSIS — Z79899 Other long term (current) drug therapy: Secondary | ICD-10-CM | POA: Diagnosis not present

## 2017-01-14 DIAGNOSIS — N400 Enlarged prostate without lower urinary tract symptoms: Secondary | ICD-10-CM | POA: Diagnosis present

## 2017-01-14 DIAGNOSIS — Z87891 Personal history of nicotine dependence: Secondary | ICD-10-CM | POA: Diagnosis not present

## 2017-01-14 DIAGNOSIS — I1310 Hypertensive heart and chronic kidney disease without heart failure, with stage 1 through stage 4 chronic kidney disease, or unspecified chronic kidney disease: Secondary | ICD-10-CM | POA: Diagnosis present

## 2017-01-14 DIAGNOSIS — K219 Gastro-esophageal reflux disease without esophagitis: Secondary | ICD-10-CM | POA: Diagnosis present

## 2017-01-14 DIAGNOSIS — I700 Atherosclerosis of aorta: Secondary | ICD-10-CM | POA: Diagnosis present

## 2017-01-14 DIAGNOSIS — E871 Hypo-osmolality and hyponatremia: Secondary | ICD-10-CM | POA: Diagnosis present

## 2017-01-14 DIAGNOSIS — R7989 Other specified abnormal findings of blood chemistry: Secondary | ICD-10-CM | POA: Diagnosis present

## 2017-01-14 DIAGNOSIS — E785 Hyperlipidemia, unspecified: Secondary | ICD-10-CM | POA: Diagnosis present

## 2017-01-14 DIAGNOSIS — G4733 Obstructive sleep apnea (adult) (pediatric): Secondary | ICD-10-CM | POA: Diagnosis present

## 2017-01-14 DIAGNOSIS — G250 Essential tremor: Secondary | ICD-10-CM | POA: Diagnosis present

## 2017-01-14 LAB — ECHOCARDIOGRAM COMPLETE
Height: 72 in
WEIGHTICAEL: 2920 [oz_av]

## 2017-01-14 LAB — TROPONIN I: TROPONIN I: 0.03 ng/mL — AB (ref <0.03)

## 2017-01-14 LAB — MAGNESIUM: Magnesium: 1.9 mg/dL (ref 1.7–2.4)

## 2017-01-14 MED ORDER — SODIUM CHLORIDE 0.9 % IV BOLUS (SEPSIS)
1000.0000 mL | Freq: Once | INTRAVENOUS | Status: AC
  Administered 2017-01-14: 1000 mL via INTRAVENOUS

## 2017-01-14 NOTE — Progress Notes (Signed)
RT placed patient on CPAP. Patient has redness on the bridge of his nose. RT changed mask to mask that fits under the nose. Patient tolerating well at this time.

## 2017-01-14 NOTE — Progress Notes (Signed)
  Echocardiogram 2D Echocardiogram has been performed.  Randy Sullivan 01/14/2017, 4:22 PM

## 2017-01-14 NOTE — Care Management Obs Status (Signed)
MEDICARE OBSERVATION STATUS NOTIFICATION   Patient Details  Name: Randy Sullivan MRN: [id number] Date of Birth: 1928-11-07   Medicare Observation Status Notification Given:  Yes    Dawayne Patricia, RN 01/14/2017, 3:11 PM

## 2017-01-14 NOTE — Progress Notes (Signed)
Carotid artery duplex has been completed. 1-39% ICA stenosis bilaterally.  01/14/17 5:03 PM Randy Sullivan RVT

## 2017-01-14 NOTE — Consult Note (Signed)
Cardiology Consultation:   Patient ID: Randy Sullivan; [id number]; December 29, 1928   Admit date: 01/13/2017 Date of Consult: 01/14/2017  Primary Care Provider: Prince Solian, MD Primary Cardiologist: Dr. Sallyanne Kuster Primary Electrophysiologist:     Patient Profile:   Randy Sullivan is a 83 y.o. male with a hx of HTN, HLD, mild OSA, essential tremor (improved on BB), and hypothyroidism who is being seen today for the evaluation of syncope with LOC at the request of Dr. Tyrell Antonio.  History of Present Illness:   Mr. Chiu is known to this service and last saw Dr. Sallyanne Kuster in clinic on 14/14/17. At that time, he was in his usual state of health. POET in 2016 without chest pain or EKG abnormalities (only able to exercise 3 minutes but reached target heart rate). Echo 2017 with normal LVEF , LVH, and grade 1 DD. PFTs showed mild COPD. Sleep study with mild OSA.   He reported to [facility] via EMS after becoming unresponsive with his wife during breakfast. His wife is at bedside and provides details, pt does not remember episode. She states that he was in his usual state of health yesterday morning and came to the kitchen for breakfast after washing his face. After breakfast, she found him with his head slumped and eyes closed. She was unable to wake him and called EMS. He became responsive with EMS after O2 [place] was supplied. Per his wife, he was hypotensive in the 11s with EMS. On arrival to ED, he had no further episodes of LOC.   This is the third episode of this nature. He has had a syncopal episode each month for the past 3 months. In Sept (11/21/16), he had a  LOC during physical therapy following TKA that was attributed to a benzo and dehydration. In Oct (12/18/16), he had a syncopal episode while on the toilet trying to have a bowel movement. At that time, he was unresponsive for approximately 10-15 min. EMS at that time again found him hypotensive.   Creatinine has bee slightly elevated since 11/2016;  no history of CKD.   Of note, he was first evaluated by Dr. Sallyanne Kuster in 11/2014 for inconsistent DOE and fatigue. ETT at that time showed no change in EKG and chronotropic competence, so beta blocker was continued.    Past Medical History:  Diagnosis Date  . BPH (benign prostatic hyperplasia)   . Cancer ([place])    non melanoma skin cancers removed  . DJD (degenerative joint disease)   . Familial tremor   . GERD (gastroesophageal reflux disease)   . Heart murmur    hx of  . Hyperlipidemia   . Hypertension   . Hypothyroidism   . Mood disorder ([place])   . Osteoarthritis   . Prostatism   . Shortness of breath    mild with excertion  . Sleep apnea    cpap  . Syncope 01/13/2017  . Thyroid disease     Past Surgical History:  Procedure Laterality Date  . APPENDECTOMY    . EYE SURGERY     bil cataracts  . HEMORROIDECTOMY    . TONSILLECTOMY    . TOTAL KNEE ARTHROPLASTY Left 11/08/2016   Procedure: LEFT TOTAL KNEE ARTHROPLASTY;  Surgeon: Gaynelle Arabian, MD;  Location: [facility];  Service: Orthopedics;  Laterality: Left;  Adductor Block     Home Medications:  Prior to Admission medications   Medication Sig Start Date End Date Taking? Authorizing Provider  albuterol (PROVENTIL HFA;VENTOLIN HFA) 108 (90 Base) MCG/ACT inhaler  Inhale 2 puffs into the lungs every 4 (four) hours as needed for wheezing or shortness of breath. 01/21/16  Yes de Bangs, Brooklyn A, MD  fluticasone [facility]) 50 MCG/ACT nasal spray Place 1 spray into both nostrils daily as needed for allergies.    Yes [provider]  fluticasone furoate-vilanterol (BREO ELLIPTA) 100-25 MCG/INH AEPB Inhale 1 puff into the lungs daily. Patient taking differently: Inhale 1 puff into the lungs at bedtime.  08/20/16  Yes Parrett, Tammy S, NP  gabapentin (NEURONTIN) 100 MG capsule Take 100 mg by mouth at bedtime. 10/28/16  Yes [provider]  hydrOXYzine (ATARAX/VISTARIL) 25 MG tablet Take 25 mg by mouth every 6 (six)  hours as needed for itching.  12/30/16  Yes [provider]  iron polysaccharides (NIFEREX) 150 MG capsule Take 150 mg by mouth daily.   Yes [provider]  levothyroxine (SYNTHROID, LEVOTHROID) 112 MCG tablet Take 112 mcg by mouth daily before breakfast.  10/14/14  Yes [provider]  loratadine (CLARITIN) 10 MG tablet Take 10 mg by mouth daily as needed for allergies.    Yes [provider]  megestrol (MEGACE) 20 MG tablet Take 20-40 mg by mouth See admin instructions. Take 2 tablets (40mg ) before breakfast and 1 tablet (20mg ) before supper.   Yes [provider]  montelukast (SINGULAIR) 10 MG tablet Take 1 tablet (10 mg total) by mouth at bedtime. 09/07/16  Yes Parrett, Tammy S, NP  MYRBETRIQ 50 MG TB24 tablet Take 50 mg by mouth daily.  06/03/15  Yes [provider]  omeprazole (PRILOSEC) 20 MG capsule Take 20 mg by mouth 2 (two) times daily before a meal.    Yes [provider]  Polyethyl Glycol-Propyl Glycol (SYSTANE OP) Apply 1-2 drops to eye daily as needed (dry eyes).    Yes [provider]  polyethylene glycol (MIRALAX / GLYCOLAX) packet Take 17 g by mouth daily as needed for mild constipation.   Yes [provider]  propranolol (INDERAL) 20 MG tablet Take 20 mg by mouth daily.  06/18/16  Yes [provider]  rosuvastatin (CRESTOR) 5 MG tablet Take 5 mg by mouth at bedtime.  06/23/16  Yes [provider]  solifenacin (VESICARE) 5 MG tablet Take 5 mg by mouth every evening.   Yes [provider]    Inpatient Medications: Scheduled Meds: . aspirin  81 mg Oral Daily  . darifenacin  7.5 mg Oral Daily  . fluticasone furoate-vilanterol  1 puff Inhalation QHS  . gabapentin  100 mg Oral QHS  . iron polysaccharides  150 mg Oral Daily  . levothyroxine  112 mcg Oral QAC breakfast  . megestrol  20 mg Oral QAC supper  . megestrol  40 mg Oral QAC breakfast  . mirabegron ER  50 mg Oral Daily  .  montelukast  10 mg Oral QHS  . pantoprazole  40 mg Oral Daily  . propranolol  20 mg Oral Daily  . rosuvastatin  5 mg Oral QHS   Continuous Infusions: . sodium chloride Stopped (01/13/17 1708)   PRN Meds: acetaminophen **OR** acetaminophen, albuterol, fluticasone, loratadine, ondansetron **OR** ondansetron (ZOFRAN) IV, polyethylene glycol, polyvinyl alcohol  Allergies:    Allergies  Allergen Reactions  . Tizanidine Other (See Comments)    Hypotension     Social History:   Social History   Socioeconomic History  . Marital status: Married    Spouse name: Not on file  . Number of children: Not on file  .  Years of education: Not on file  . Highest education level: Not on file  Social Needs  . Financial resource strain: Not on file  . Food insecurity - worry: Not on file  . Food insecurity - inability: Not on file  . Transportation needs - medical: Not on file  . Transportation needs - non-medical: Not on file  Occupational History  . Occupation: retired  Tobacco Use  . Smoking status: Former Smoker    Packs/day: 2.00    Years: 18.00    Pack years: 36.00    Types: Cigarettes    Last attempt to quit: 06/04/1970    Years since quitting: 46.6  . Smokeless tobacco: Never Used  Substance and Sexual Activity  . Alcohol use: Yes    Alcohol/week: 0.0 oz    Comment: special occasions  . Drug use: No  . Sexual activity: Not Currently  Other Topics Concern  . Not on file  Social History Narrative   Rare caffeine use     Family History:    Family History  Problem Relation Age of Onset  . Heart attack Father   . Colon cancer Mother   . Colon cancer Sister      ROS:  Please see the history of present illness.  ROS  All other ROS reviewed and negative.     Physical Exam/Data:   Vitals:   01/13/17 1634 01/13/17 2134 01/14/17 0459 01/14/17 0715  BP: (!) 160/84 140/78 (!) 165/79 (!) 165/98  Pulse: 78 99 96   Resp: 18     Temp: 97.8 F (36.6 C) 97.6 F (36.4 C)  97.7 F (36.5 C)   TempSrc: Oral Oral Axillary   SpO2: 100% 98% 100%   Weight: 181 lb 7 oz (82.3 kg)  182 lb 8 oz (82.8 kg)   Height: 6' (1.829 m)       Intake/Output Summary (Last 24 hours) at 01/14/2017 1247 Last data filed at 01/14/2017 1225 Gross per 24 hour  Intake 2314.17 ml  Output 1650 ml  Net 664.17 ml   Filed Weights   01/13/17 1052 01/13/17 1634 01/14/17 0459  Weight: 190 lb (86.2 kg) 181 lb 7 oz (82.3 kg) 182 lb 8 oz (82.8 kg)   Body mass index is 24.75 kg/m.  General:  Well nourished, well developed, in no acute distress HEENT: normal Neck: no JVD Vascular: No carotid bruits; FA pulses 2+ bilaterally without bruits  Cardiac:  Irregular rhythm, regular rate; S1 and S2 normal, no murmur Lungs:  clear to auscultation bilaterally, no wheezing, rhonchi or rales  Abd: soft, nontender, no hepatomegaly  Ext: no edema Musculoskeletal:  No deformities, BUE and BLE strength normal and equal Skin: warm and dry  Neuro:  CNs 2-12 intact, no focal abnormalities noted Psych:  Normal affect   EKG:  The EKG was personally reviewed and demonstrates:  Sinus with PVCs Telemetry:  Telemetry was personally reviewed and demonstrates:  Sinus with PVCs  Relevant CV Studies:  Echo 03/13/15: Study Conclusions - Left ventricle: Wall thickness was increased in a pattern of   moderate LVH. Systolic function was normal. The estimated   ejection fraction was in the range of 55% to 60%. Wall motion was   normal; there were no regional wall motion abnormalities. Doppler   parameters are consistent with abnormal left ventricular   relaxation (grade 1 diastolic dysfunction). - Aortic valve: Trileaflet; moderate calcification towards the   leaflet tips with some nodularity. There was no stenosis. There  was no regurgitation. - Aorta: Mildly dilated aortic root. Aortic root dimension: 39 mm   (ED). - Mitral valve: There was trivial regurgitation. - Right ventricle: The cavity size was  normal. Systolic function   was normal. - Pulmonary arteries: No complete TR doppler jet so unable to   estimate PA systolic pressure. - Inferior vena cava: The vessel was normal in size. The   respirophasic diameter changes were in the normal range (>= 50%),   consistent with normal central venous pressure.  Impressions: - Normal LV size with moderate LV hypertrophy. EF 55-60%. Normal RV   size and systolic function. Nodular calcification towards the   tips of the aortic valve leaflets. This looks like calcification   rather than vegetation. No aortic stenosis or regurgitation.  Laboratory Data:  Chemistry Recent Labs  Lab 01/13/17 1135  NA 132*  K 4.4  CL 103  CO2 23  GLUCOSE 88  BUN 23*  CREATININE 1.32*  CALCIUM 9.2  GFRNONAA 47*  GFRAA 55*  ANIONGAP 6    Recent Labs  Lab 01/13/17 1135  PROT 6.4*  ALBUMIN 3.6  AST 17  ALT 12*  ALKPHOS 62  BILITOT 0.7   Hematology Recent Labs  Lab 01/13/17 1045  WBC 7.2  RBC 3.60*  HGB 11.2*  HCT 32.9*  MCV 91.4  MCH 31.1  MCHC 34.0  RDW 14.7  PLT 248   Cardiac Enzymes Recent Labs  Lab 01/13/17 1135 01/13/17 1651 01/13/17 2207 01/14/17 0509  TROPONINI 0.03* 0.03* 0.03* 0.03*   No results for input(s): TROPIPOC in the last 168 hours.  BNPNo results for input(s): BNP, PROBNP in the last 168 hours.  DDimer No results for input(s): DDIMER in the last 168 hours.  Radiology/Studies:  Dg Chest 2 View  Result Date: 01/13/2017 CLINICAL DATA:  Syncope EXAM: CHEST  2 VIEW COMPARISON:  11/21/2016 FINDINGS: Heart is normal in size. Lungs under aerated and grossly clear. No pneumothorax. No pleural effusion. Stable thoracic spine. IMPRESSION: No active cardiopulmonary disease. Electronically Signed   By: Marybelle Killings M.D.   On: 01/13/2017 11:33    Assessment and Plan:   1. Syncope - third episode - no arrhythmias on telemetry - PVCs noted - he has no history of cardiac problems - would recommend obtaining  echocardiogram to assess structure and function - discussed possibility of dehydration, consistent with elevated creatinine and hyponatremia - will give 1L NS  - recommend a 30-day event monitor for arrhythmias; if nothing detected, may consider a ILR - will also obtain carotid dopplers   2. Elevated troponin - troponin x 4 = 0.03 - given this minimally positive and flat troponin, this is more consistent with demand ischemia - he is chest pain free and had a recent ETT that was normal, will not pursue further ischemic evaluation unless echo is abnormal  3. HLD - crestor on home med list  4. HTN - current meds: propranolol for tremor - discussed with patient decreasing dose of beta blocker to prevent hypotensive episodes, however, it is controlling his tremor - pressures have actually been elevated this admission (110-160s) - will discuss with attending  5. Acute kidney injury - sCr on admission was 1.32, was 1.34 12/18/16, but 1.07 on 11/10/16 - discussed dehydration with patient   For questions or updates, please contact [place] Please consult [email or web address] for contact info under Cardiology/STEMI.   Signed, Ledora Bottcher, [place]  01/14/2017 12:47 PM

## 2017-01-14 NOTE — Progress Notes (Signed)
PROGRESS NOTE    Randy Sullivan  DZH:[id number] DOB: 01-Dec-1928 DOA: 01/13/2017 PCP: Prince Solian, MD  Brief Narrative: Randy Sullivan is an 83 y.o. male with a PMH of OSA on CPAP, hypertension, hypothyroidism, hyperlipidemia and BPH who was brought to the hospital via EMS for an episode of unresponsiveness. Patient's wife provides most of the history as the patient does not have any recollection of the events. According to her, he was sitting at the breakfast table when he became unresponsive to her. She was unable to rouse him. He subsequently called EMS and the patient came to. She thinks that he may have been unresponsive for approximately 10 minutes. Denied any associated chest pain, prodromal dizziness, significant shortness of breath (although the patient's wife does say he complained of shortness of breath earlier in the day). Patient's wife says this is his third episode of passing out. The first episode occurred when he was undergoing rehabilitation after a TKR and it was attributed to a reaction to temazepam. The second episode occurred when he was sitting on the commode and was felt to be vasovagal in etiology. Patient has not had any history of heart problems. He had a negative stress test in 2016, and an 2-D echo 01/01/16 which showed EF 55-60 percent with no regional wall motion abnormalities and grade 1 diastolic dysfunction.    Assessment & Plan:   Principal Problem:   Syncope Active Problems:   Benign hypertensive heart disease without heart failure   Hypercholesterolemia   BPH (benign prostatic hyperplasia)   OSA (obstructive sleep apnea)   COPD (chronic obstructive pulmonary disease) (HCC)   Essential hypertension   Thoracic aortic atherosclerosis (HCC)   CKD (chronic kidney disease), stage III ([place])   1-Syncope; unclear etiology.  3 rd episode.  Will consult cardiology, he will need holter.  Cardio recommend ECHO, carotid doppler.    2-HTN; continue with  prpanolol.   3-PVC; continue propanolol. Check mg level.   4-COPD; Continue Breo-Ellipta, Singular, when necessary bronchodilators  CKD (chronic kidney disease), stage III (HCC) Baseline creatinine appears to be around 1.07-1.2.    DVT prophylaxis: SCD Code Status: full code Family Communication: wife at bedside.  Disposition Plan: home when work up completed.   Consultants:   Cardiology    Procedures: ECHO  Doppelr/    Antimicrobials:   none   Subjective: He is feeling well, no further episode. He denies diarrhea, vomiting. Report good oral intake.  Report some dyspnea on exertion   Objective: Vitals:   01/13/17 2134 01/14/17 0459 01/14/17 0715 01/14/17 1434  BP: 140/78 (!) 165/79 (!) 165/98   Pulse: 99 96    Resp:      Temp: 97.6 F (36.4 C) 97.7 F (36.5 C)  98.2 F (36.8 C)  TempSrc: Oral Axillary  Oral  SpO2: 98% 100%  100%  Weight:  82.8 kg (182 lb 8 oz)    Height:        Intake/Output Summary (Last 24 hours) at 01/14/2017 1541 Last data filed at 01/14/2017 1505 Gross per 24 hour  Intake 2314.17 ml  Output 1925 ml  Net 389.17 ml   Filed Weights   01/13/17 1052 01/13/17 1634 01/14/17 0459  Weight: 86.2 kg (190 lb) 82.3 kg (181 lb 7 oz) 82.8 kg (182 lb 8 oz)    Examination:  General exam: Appears calm and comfortable  Respiratory system: Clear to auscultation. Respiratory effort normal. Cardiovascular system: S1 & S2 heard, RRR. No JVD, murmurs, rubs, gallops  or clicks. No pedal edema. Gastrointestinal system: Abdomen is nondistended, soft and nontender. No organomegaly or masses felt. Normal bowel sounds heard. Central nervous system: Alert and oriented. No focal neurological deficits. Extremities: Symmetric 5 x 5 power. Skin: No rashes, lesions or ulcers    Data Reviewed: I have personally reviewed following labs and imaging studies  CBC: Recent Labs  Lab 01/13/17 1045  WBC 7.2  NEUTROABS 3.4  HGB 11.2*  HCT 32.9*  MCV 91.4    PLT 948   Basic Metabolic Panel: Recent Labs  Lab 01/13/17 1135 01/14/17 0509  NA 132*  --   K 4.4  --   CL 103  --   CO2 23  --   GLUCOSE 88  --   BUN 23*  --   CREATININE 1.32*  --   CALCIUM 9.2  --   MG  --  1.9   GFR: Estimated Creatinine Clearance: 44.1 mL/min (A) (by C-G formula based on SCr of 1.32 mg/dL (H)). Liver Function Tests: Recent Labs  Lab 01/13/17 1135  AST 17  ALT 12*  ALKPHOS 62  BILITOT 0.7  PROT 6.4*  ALBUMIN 3.6   No results for input(s): LIPASE, AMYLASE in the last 168 hours. No results for input(s): AMMONIA in the last 168 hours. Coagulation Profile: No results for input(s): INR, PROTIME in the last 168 hours. Cardiac Enzymes: Recent Labs  Lab 01/13/17 1135 01/13/17 1651 01/13/17 2207 01/14/17 0509  TROPONINI 0.03* 0.03* 0.03* 0.03*   BNP (last 3 results) No results for input(s): PROBNP in the last 8760 hours. HbA1C: No results for input(s): HGBA1C in the last 72 hours. CBG: Recent Labs  Lab 01/13/17 1052  GLUCAP 90   Lipid Profile: No results for input(s): CHOL, HDL, LDLCALC, TRIG, CHOLHDL, LDLDIRECT in the last 72 hours. Thyroid Function Tests: Recent Labs    01/13/17 1651  TSH 1.317   Anemia Panel: No results for input(s): VITAMINB12, FOLATE, FERRITIN, TIBC, IRON, RETICCTPCT in the last 72 hours. Sepsis Labs: No results for input(s): PROCALCITON, LATICACIDVEN in the last 168 hours.  No results found for this or any previous visit (from the past 240 hour(s)).       Radiology Studies: Dg Chest 2 View  Result Date: 01/13/2017 CLINICAL DATA:  Syncope EXAM: CHEST  2 VIEW COMPARISON:  11/21/2016 FINDINGS: Heart is normal in size. Lungs under aerated and grossly clear. No pneumothorax. No pleural effusion. Stable thoracic spine. IMPRESSION: No active cardiopulmonary disease. Electronically Signed   By: Marybelle Killings M.D.   On: 01/13/2017 11:33        Scheduled Meds: . aspirin  81 mg Oral Daily  . darifenacin   7.5 mg Oral Daily  . fluticasone furoate-vilanterol  1 puff Inhalation QHS  . gabapentin  100 mg Oral QHS  . iron polysaccharides  150 mg Oral Daily  . levothyroxine  112 mcg Oral QAC breakfast  . megestrol  20 mg Oral QAC supper  . megestrol  40 mg Oral QAC breakfast  . mirabegron ER  50 mg Oral Daily  . montelukast  10 mg Oral QHS  . pantoprazole  40 mg Oral Daily  . propranolol  20 mg Oral Daily  . rosuvastatin  5 mg Oral QHS   Continuous Infusions: . sodium chloride Stopped (01/13/17 1708)  . sodium chloride 1,000 mL (01/14/17 1504)     LOS: 0 days    Time spent: 35 minutes.     Elmarie Shiley, MD [facility] Pager  [phone]  If 7PM-7AM, please contact night-coverage [email or web address] Password [facility] 01/14/2017, 3:41 PM

## 2017-01-15 MED ORDER — ASPIRIN 81 MG PO CHEW: 81.0000 mg | CHEWABLE_TABLET | Freq: Every day | ORAL | 0 refills | Status: DC

## 2017-01-15 NOTE — Progress Notes (Signed)
Progress Note  Patient Name: Randy Sullivan Date of Encounter: 01/15/2017  Primary Cardiologist: Oval Linsey  Subjective   No complaints   Inpatient Medications    Scheduled Meds: . aspirin  81 mg Oral Daily  . darifenacin  7.5 mg Oral Daily  . fluticasone furoate-vilanterol  1 puff Inhalation QHS  . gabapentin  100 mg Oral QHS  . iron polysaccharides  150 mg Oral Daily  . levothyroxine  112 mcg Oral QAC breakfast  . megestrol  20 mg Oral QAC supper  . megestrol  40 mg Oral QAC breakfast  . mirabegron ER  50 mg Oral Daily  . montelukast  10 mg Oral QHS  . pantoprazole  40 mg Oral Daily  . propranolol  20 mg Oral Daily  . rosuvastatin  5 mg Oral QHS   Continuous Infusions: . sodium chloride Stopped (01/13/17 1708)   PRN Meds: acetaminophen **OR** acetaminophen, albuterol, fluticasone, loratadine, ondansetron **OR** ondansetron (ZOFRAN) IV, polyethylene glycol, polyvinyl alcohol   Vital Signs    Vitals:   01/14/17 2208 01/15/17 0315 01/15/17 0452 01/15/17 0454  BP:  (!) 169/97    Pulse: 92 94    Resp: 16   16  Temp:    98.2 F (36.8 C)  TempSrc:    Oral  SpO2: 96%   98%  Weight:   179 lb 1.6 oz (81.2 kg)   Height:        Intake/Output Summary (Last 24 hours) at 01/15/2017 0822 Last data filed at 01/15/2017 0735 Gross per 24 hour  Intake 2080 ml  Output 2475 ml  Net -395 ml   Filed Weights   01/13/17 1634 01/14/17 0459 01/15/17 0452  Weight: 181 lb 7 oz (82.3 kg) 182 lb 8 oz (82.8 kg) 179 lb 1.6 oz (81.2 kg)    Telemetry    SR occasional PVC;s  - Personally Reviewed  ECG    SR PVC otherwise normal  - Personally Reviewed  Physical Exam   GEN: No acute distress.   Neck: No JVD Cardiac: RRR, no murmurs, rubs, or gallops.  Respiratory: Clear to auscultation bilaterally. GI: Soft, nontender, non-distended  MS: No edema; No deformity. Neuro:  Nonfocal  Psych: Normal affect   Labs    Chemistry Recent Labs  Lab 01/13/17 1135  NA 132*  K 4.4   CL 103  CO2 23  GLUCOSE 88  BUN 23*  CREATININE 1.32*  CALCIUM 9.2  PROT 6.4*  ALBUMIN 3.6  AST 17  ALT 12*  ALKPHOS 62  BILITOT 0.7  GFRNONAA 47*  GFRAA 55*  ANIONGAP 6     Hematology Recent Labs  Lab 01/13/17 1045  WBC 7.2  RBC 3.60*  HGB 11.2*  HCT 32.9*  MCV 91.4  MCH 31.1  MCHC 34.0  RDW 14.7  PLT 248    Cardiac Enzymes Recent Labs  Lab 01/13/17 1135 01/13/17 1651 01/13/17 2207 01/14/17 0509  TROPONINI 0.03* 0.03* 0.03* 0.03*   No results for input(s): TROPIPOC in the last 168 hours.   BNPNo results for input(s): BNP, PROBNP in the last 168 hours.   DDimer No results for input(s): DDIMER in the last 168 hours.   Radiology    Dg Chest 2 View  Result Date: 01/13/2017 CLINICAL DATA:  Syncope EXAM: CHEST  2 VIEW COMPARISON:  11/21/2016 FINDINGS: Heart is normal in size. Lungs under aerated and grossly clear. No pneumothorax. No pleural effusion. Stable thoracic spine. IMPRESSION: No active cardiopulmonary disease. Electronically Signed  By: Marybelle Killings M.D.   On: 01/13/2017 11:33    Cardiac Studies   Echo normal EF 65% Carotids plaque no stenosis Both studies reviewed   Patient Profile     4M with hypertension, hyperlipidemia, and OSA here with recurrent syncope.  He is here with the third episode of syncope in 3 months.  He is unable to recount how he feels prior to the episodes.  At baseline he has no chest pain or palpitations but has been a little short of breath with exertion.  He has been much less active after his knee replacement surgery.  Based on EMS reports his BP has been low to the 10Z systolic.  Here he has been hypertensive.  By his wife's report his fluid intake has been poor.  He his mildly hyponatremic and creatinine is mildly eleved.  He was orthostatic on admission with a 20 mmHg drop in BP from sitting to staning.  Assessment & Plan    Syncope: does not appear to be cardiac related per Dr Oval Linsey will make arrangements for  him to pick Up 30 day event monitor at office next week. R/o ECG no acute changes and echo with normal RV/LV function  Will sign off   For questions or updates, please contact [place] Please consult [email or web address] for contact info under Cardiology/STEMI.      Signed, Jenkins Rouge, MD  01/15/2017, 8:22 AM

## 2017-01-15 NOTE — Discharge Summary (Signed)
Physician Discharge Summary  Randy Sullivan LKG:[id number] DOB: 16-Jun-1928 DOA: 01/13/2017  PCP: Randy Solian, MD  Admit date: 01/13/2017 Discharge date: 01/15/2017  Admitted From: home  Disposition:  Home   Recommendations for Outpatient Follow-up:  1. Follow up with PCP in 1-2 weeks 2. Please obtain BMP/CBC in one week 3. Follow up with cardiologist for event monitor.     Discharge Condition: stable.  CODE STATUS: full code.  Diet recommendation: Heart Healthy  Brief/Interim Summary:  Randy Schiefelbein Randy Sullivan an 82 y.o.malewith a PMH of OSA on CPAP, hypertension, hypothyroidism, hyperlipidemia and BPH who was brought to the hospital via EMS for an episode of unresponsiveness. Patient's wife provides most of the history as the patient does not have any recollection of the events. According to her, he was sitting at the breakfast table when he became unresponsive to her. She was unable to rouse him. He subsequently called EMS and the patient came to. She thinks that he may have been unresponsive for approximately 10 minutes. Denied any associated chest pain, prodromal dizziness, significant shortness of breath (although the patient's wife does say he complained of shortness of breath earlier in the day). Patient's wife says this is his third episode of passing out. The first episode occurred when he was undergoing rehabilitation after a TKR and it was attributed to a reaction to temazepam. The second episode occurred when he was sitting on the commode and was felt to be vasovagal in etiology. Patient has not had any history of heart problems. He had a negative stress test in 2016, and an 2-D echo 01/01/16 which showed EF 55-60 percent with no regional wall motion abnormalities and grade 1 diastolic dysfunction.    Assessment & Plan:   Principal Problem:   Syncope Active Problems:   Benign hypertensive heart disease without heart failure   Hypercholesterolemia   BPH (benign prostatic  hyperplasia)   OSA (obstructive sleep apnea)   COPD (chronic obstructive pulmonary disease) (HCC)   Essential hypertension   Thoracic aortic atherosclerosis (HCC)   CKD (chronic kidney disease), stage III ([place])   1-Syncope; unclear etiology. ? Dehydration  3 rd episode.  Plan for event monitor.  ECHO normal ef, no significant aortic stenosis.  Carotid doppler mild plaque.  Received IV fluids.    2-HTN; continue with propanolol.   3-PVC; continue propanolol. Mg normal.   4-COPD; ContinueBreo-Ellipta, Singular,when necessary bronchodilators  CKD (chronic kidney disease), stage III ([name]) Baseline creatinine appears to be around 1.07-1.2.       Discharge Diagnoses:  Principal Problem:   Syncope Active Problems:   Benign hypertensive heart disease without heart failure   Hypercholesterolemia   BPH (benign prostatic hyperplasia)   OSA (obstructive sleep apnea)   COPD (chronic obstructive pulmonary disease) (HCC)   Essential hypertension   Thoracic aortic atherosclerosis (HCC)   CKD (chronic kidney disease), stage III [name])    Discharge Instructions  Discharge Instructions    Diet - low sodium heart healthy   Complete by:  As directed    Increase activity slowly   Complete by:  As directed      Allergies as of 01/15/2017      Reactions   Tizanidine Other (See Comments)   Hypotension       Medication List    STOP taking these medications   hydrOXYzine 25 MG tablet Commonly known as:  ATARAX/VISTARIL     TAKE these medications   albuterol 108 (90 Base) MCG/ACT inhaler Commonly known as:  PROVENTIL  HFA;VENTOLIN HFA Inhale 2 puffs into the lungs every 4 (four) hours as needed for wheezing or shortness of breath.   aspirin 81 MG chewable tablet Chew 1 tablet (81 mg total) by mouth daily. Start taking on:  01/16/2017   fluticasone 50 MCG/ACT nasal spray Commonly known as:  FLONASE Place 1 spray into both nostrils daily as needed for  allergies.   fluticasone furoate-vilanterol 100-25 MCG/INH Aepb Commonly known as:  BREO ELLIPTA Inhale 1 puff into the lungs daily. What changed:  when to take this   gabapentin 100 MG capsule Commonly known as:  NEURONTIN Take 100 mg by mouth at bedtime.   iron polysaccharides 150 MG capsule Commonly known as:  NIFEREX Take 150 mg by mouth daily.   levothyroxine 112 MCG tablet Commonly known as:  SYNTHROID, LEVOTHROID Take 112 mcg by mouth daily before breakfast.   loratadine 10 MG tablet Commonly known as:  CLARITIN Take 10 mg by mouth daily as needed for allergies.   megestrol 20 MG tablet Commonly known as:  MEGACE Take 20-40 mg by mouth See admin instructions. Take 2 tablets (40mg ) before breakfast and 1 tablet (20mg ) before supper.   montelukast 10 MG tablet Commonly known as:  SINGULAIR Take 1 tablet (10 mg total) by mouth at bedtime.   MYRBETRIQ 50 MG Tb24 tablet Generic drug:  mirabegron ER Take 50 mg by mouth daily.   omeprazole 20 MG capsule Commonly known as:  PRILOSEC Take 20 mg by mouth 2 (two) times daily before a meal.   polyethylene glycol packet Commonly known as:  MIRALAX / GLYCOLAX Take 17 g by mouth daily as needed for mild constipation.   propranolol 20 MG tablet Commonly known as:  INDERAL Take 20 mg by mouth daily.   rosuvastatin 5 MG tablet Commonly known as:  CRESTOR Take 5 mg by mouth at bedtime.   solifenacin 5 MG tablet Commonly known as:  VESICARE Take 5 mg by mouth every evening.   SYSTANE OP Apply 1-2 drops to eye daily as needed (dry eyes).       Allergies  Allergen Reactions  . Tizanidine Other (See Comments)    Hypotension     Consultations: Cardiology   Procedures/Studies: Dg Chest 2 View  Result Date: 01/13/2017 CLINICAL DATA:  Syncope EXAM: CHEST  2 VIEW COMPARISON:  11/21/2016 FINDINGS: Heart is normal in size. Lungs under aerated and grossly clear. No pneumothorax. No pleural effusion. Stable thoracic  spine. IMPRESSION: No active cardiopulmonary disease. Electronically Signed   By: Marybelle Killings M.D.   On: 01/13/2017 11:33   Ct Angio Chest Pe W/cm &/or Wo Cm  Result Date: 12/18/2016 CLINICAL DATA:  Syncopal episode this morning while going to the bathroom. Elevated D-dimer. EXAM: CT ANGIOGRAPHY CHEST WITH CONTRAST TECHNIQUE: Multidetector CT imaging of the chest was performed using the standard protocol during bolus administration of intravenous contrast. Multiplanar CT image reconstructions and MIPs were obtained to evaluate the vascular anatomy. CONTRAST:  780 mL of Isovue 370 IV. COMPARISON:  08/30/2009 FINDINGS: Cardiovascular: Heart is normal in size. Mild calcified plaque over the left anterior descending coronary artery. Mild calcified plaque over the thoracic aorta. No evidence of pulmonary embolism. Mediastinum/Nodes: No evidence of mediastinal or hilar adenopathy. Remaining mediastinal structures are within normal. Lungs/Pleura: Lungs are adequately inflated and demonstrate mild paraseptal and centrilobular emphysematous disease. There is mild posterior dependent atelectatic change. There is minimal peripheral linear opacification over the lateral left mid to upper lung likely atelectasis. There is subtle  associated peripheral increased interstitial markings over the lateral left midlung. Upper Abdomen: Within normal. Musculoskeletal: Degenerative change of the spine. Review of the MIP images confirms the above findings. IMPRESSION: No evidence of pulmonary embolism. Subtle posterior dependent atelectasis with emphysematous disease. Minimal scarring/atelectasis over the lateral left mid to upper lung. Mild atherosclerotic coronary artery disease. Aortic Atherosclerosis (ICD10-I70.0) and Emphysema (ICD10-J43.9). Electronically Signed   By: Marin Olp M.D.   On: 12/18/2016 16:05       Subjective: Feeling ok, no new complaints Denies chest pain.  No further syncope episodes   Discharge  Exam: Vitals:   01/15/17 0454 01/15/17 1029  BP:    Pulse:  86  Resp: 16 15  Temp: 98.2 F (36.8 C)   SpO2: 98% 98%   Vitals:   01/15/17 0315 01/15/17 0452 01/15/17 0454 01/15/17 1029  BP: (!) 169/97     Pulse: 94   86  Resp:   16 15  Temp:   98.2 F (36.8 C)   TempSrc:   Oral   SpO2:   98% 98%  Weight:  81.2 kg (179 lb 1.6 oz)    Height:        General: Pt is alert, awake, not in acute distress Cardiovascular: RRR, S1/S2 +, no rubs, no gallops Respiratory: CTA bilaterally, no wheezing, no rhonchi Abdominal: Soft, NT, ND, bowel sounds + Extremities: no edema, no cyanosis    The results of significant diagnostics from this hospitalization (including imaging, microbiology, ancillary and laboratory) are listed below for reference.     Microbiology: No results found for this or any previous visit (from the past 240 hour(s)).   Labs: BNP (last 3 results) No results for input(s): BNP in the last 8760 hours. Basic Metabolic Panel: Recent Labs  Lab 01/13/17 1135 01/14/17 0509  NA 132*  --   K 4.4  --   CL 103  --   CO2 23  --   GLUCOSE 88  --   BUN 23*  --   CREATININE 1.32*  --   CALCIUM 9.2  --   MG  --  1.9   Liver Function Tests: Recent Labs  Lab 01/13/17 1135  AST 17  ALT 12*  ALKPHOS 62  BILITOT 0.7  PROT 6.4*  ALBUMIN 3.6   No results for input(s): LIPASE, AMYLASE in the last 168 hours. No results for input(s): AMMONIA in the last 168 hours. CBC: Recent Labs  Lab 01/13/17 1045  WBC 7.2  NEUTROABS 3.4  HGB 11.2*  HCT 32.9*  MCV 91.4  PLT 248   Cardiac Enzymes: Recent Labs  Lab 01/13/17 1135 01/13/17 1651 01/13/17 2207 01/14/17 0509  TROPONINI 0.03* 0.03* 0.03* 0.03*   BNP: Invalid input(s): POCBNP CBG: Recent Labs  Lab 01/13/17 1052  GLUCAP 90   D-Dimer No results for input(s): DDIMER in the last 72 hours. Hgb A1c No results for input(s): HGBA1C in the last 72 hours. Lipid Profile No results for input(s): CHOL, HDL,  LDLCALC, TRIG, CHOLHDL, LDLDIRECT in the last 72 hours. Thyroid function studies Recent Labs    01/13/17 1651  TSH 1.317   Anemia work up No results for input(s): VITAMINB12, FOLATE, FERRITIN, TIBC, IRON, RETICCTPCT in the last 72 hours. Urinalysis    Component Value Date/Time   COLORURINE YELLOW 12/18/2016 [place] 12/18/2016 1327   LABSPEC 1.015 12/18/2016 1327   PHURINE 6.0 12/18/2016 1327   GLUCOSEU NEGATIVE 12/18/2016 1327   [place] 12/18/2016 1327  BILIRUBINUR NEGATIVE 12/18/2016 [place] 12/18/2016 [place] 12/18/2016 1327   NITRITE NEGATIVE 12/18/2016 1327   LEUKOCYTESUR NEGATIVE 12/18/2016 1327   Sepsis Labs Invalid input(s): PROCALCITONIN,  WBC,  LACTICIDVEN Microbiology No results found for this or any previous visit (from the past 240 hour(s)).   Time coordinating discharge: Over 30 minutes  SIGNED:   Elmarie Shiley, MD  [facility] 01/15/2017, 11:07 AM Pager   If 7PM-7AM, please contact night-coverage [email or web address] Password [email or web address]

## 2017-01-17 DIAGNOSIS — M25562 Pain in left knee: Secondary | ICD-10-CM | POA: Diagnosis not present

## 2017-01-18 ENCOUNTER — Observation Stay (HOSPITAL_COMMUNITY): Payer: MEDICARE

## 2017-01-18 ENCOUNTER — Observation Stay (HOSPITAL_COMMUNITY)
Admit: 2017-01-18 | Discharge: 2017-01-18 | Disposition: A | Discharge status: Home / Self Care | Payer: MEDICARE | Attending: Nephrology | Admitting: Nephrology

## 2017-01-18 ENCOUNTER — Emergency Department (HOSPITAL_COMMUNITY): Payer: MEDICARE

## 2017-01-18 ENCOUNTER — Encounter (HOSPITAL_COMMUNITY): Payer: Self-pay | Admitting: Emergency Medicine

## 2017-01-18 ENCOUNTER — Inpatient Hospital Stay (HOSPITAL_COMMUNITY)
Admission: EM | Admit: 2017-01-18 | Discharge: 2017-01-20 | DRG: 101 | Disposition: A | Discharge status: Home / Self Care | Payer: MEDICARE | Attending: Internal Medicine | Admitting: Internal Medicine

## 2017-01-18 DIAGNOSIS — R4020 Unspecified coma: Secondary | ICD-10-CM | POA: Diagnosis not present

## 2017-01-18 DIAGNOSIS — Z87891 Personal history of nicotine dependence: Secondary | ICD-10-CM

## 2017-01-18 DIAGNOSIS — K219 Gastro-esophageal reflux disease without esophagitis: Secondary | ICD-10-CM | POA: Diagnosis present

## 2017-01-18 DIAGNOSIS — R4789 Other speech disturbances: Secondary | ICD-10-CM | POA: Diagnosis not present

## 2017-01-18 DIAGNOSIS — Z96652 Presence of left artificial knee joint: Secondary | ICD-10-CM | POA: Diagnosis present

## 2017-01-18 DIAGNOSIS — R55 Syncope and collapse: Secondary | ICD-10-CM | POA: Diagnosis not present

## 2017-01-18 DIAGNOSIS — N400 Enlarged prostate without lower urinary tract symptoms: Secondary | ICD-10-CM | POA: Diagnosis not present

## 2017-01-18 DIAGNOSIS — R479 Unspecified speech disturbances: Secondary | ICD-10-CM | POA: Diagnosis not present

## 2017-01-18 DIAGNOSIS — E785 Hyperlipidemia, unspecified: Secondary | ICD-10-CM | POA: Diagnosis present

## 2017-01-18 DIAGNOSIS — E871 Hypo-osmolality and hyponatremia: Secondary | ICD-10-CM | POA: Diagnosis present

## 2017-01-18 DIAGNOSIS — I129 Hypertensive chronic kidney disease with stage 1 through stage 4 chronic kidney disease, or unspecified chronic kidney disease: Secondary | ICD-10-CM | POA: Diagnosis present

## 2017-01-18 DIAGNOSIS — R569 Unspecified convulsions: Secondary | ICD-10-CM | POA: Diagnosis not present

## 2017-01-18 DIAGNOSIS — N183 Chronic kidney disease, stage 3 unspecified: Secondary | ICD-10-CM | POA: Diagnosis present

## 2017-01-18 DIAGNOSIS — Z7982 Long term (current) use of aspirin: Secondary | ICD-10-CM

## 2017-01-18 DIAGNOSIS — J449 Chronic obstructive pulmonary disease, unspecified: Secondary | ICD-10-CM | POA: Diagnosis present

## 2017-01-18 DIAGNOSIS — Z7951 Long term (current) use of inhaled steroids: Secondary | ICD-10-CM

## 2017-01-18 DIAGNOSIS — E039 Hypothyroidism, unspecified: Secondary | ICD-10-CM | POA: Diagnosis present

## 2017-01-18 DIAGNOSIS — R9082 White matter disease, unspecified: Secondary | ICD-10-CM | POA: Diagnosis present

## 2017-01-18 DIAGNOSIS — G4733 Obstructive sleep apnea (adult) (pediatric): Secondary | ICD-10-CM | POA: Diagnosis not present

## 2017-01-18 DIAGNOSIS — Z888 Allergy status to other drugs, medicaments and biological substances status: Secondary | ICD-10-CM

## 2017-01-18 DIAGNOSIS — R404 Transient alteration of awareness: Secondary | ICD-10-CM | POA: Diagnosis not present

## 2017-01-18 DIAGNOSIS — G2581 Restless legs syndrome: Secondary | ICD-10-CM | POA: Diagnosis present

## 2017-01-18 DIAGNOSIS — E7800 Pure hypercholesterolemia, unspecified: Secondary | ICD-10-CM | POA: Diagnosis not present

## 2017-01-18 DIAGNOSIS — I10 Essential (primary) hypertension: Secondary | ICD-10-CM

## 2017-01-18 DIAGNOSIS — Z79890 Hormone replacement therapy (postmenopausal): Secondary | ICD-10-CM

## 2017-01-18 DIAGNOSIS — F0390 Unspecified dementia without behavioral disturbance: Secondary | ICD-10-CM | POA: Diagnosis present

## 2017-01-18 DIAGNOSIS — Z974 Presence of external hearing-aid: Secondary | ICD-10-CM

## 2017-01-18 DIAGNOSIS — R0989 Other specified symptoms and signs involving the circulatory and respiratory systems: Secondary | ICD-10-CM | POA: Diagnosis not present

## 2017-01-18 DIAGNOSIS — I700 Atherosclerosis of aorta: Secondary | ICD-10-CM | POA: Diagnosis present

## 2017-01-18 LAB — URINALYSIS, ROUTINE W REFLEX MICROSCOPIC
BILIRUBIN URINE: NEGATIVE
Glucose, UA: NEGATIVE mg/dL
HGB URINE DIPSTICK: NEGATIVE
KETONES UR: NEGATIVE mg/dL
Leukocytes, UA: NEGATIVE
Nitrite: NEGATIVE
PH: 7 (ref 5.0–8.0)
Protein, ur: NEGATIVE mg/dL
SPECIFIC GRAVITY, URINE: 1.008 (ref 1.005–1.030)

## 2017-01-18 LAB — DIFFERENTIAL
Basophils Absolute: 0.1 10*3/uL (ref 0.0–0.1)
Basophils Relative: 1 %
Eosinophils Absolute: 0.6 10*3/uL (ref 0.0–0.7)
Eosinophils Relative: 8 %
LYMPHS ABS: 2.1 10*3/uL (ref 0.7–4.0)
LYMPHS PCT: 27 %
MONO ABS: 0.9 10*3/uL (ref 0.1–1.0)
Monocytes Relative: 12 %
NEUTROS ABS: 3.9 10*3/uL (ref 1.7–7.7)
NEUTROS PCT: 52 %

## 2017-01-18 LAB — CBC
HCT: 31.7 % — ABNORMAL LOW (ref 39.0–52.0)
HEMOGLOBIN: 10.9 g/dL — AB (ref 13.0–17.0)
MCH: 31.1 pg (ref 26.0–34.0)
MCHC: 34.4 g/dL (ref 30.0–36.0)
MCV: 90.3 fL (ref 78.0–100.0)
PLATELETS: 255 10*3/uL (ref 150–400)
RBC: 3.51 MIL/uL — AB (ref 4.22–5.81)
RDW: 14.2 % (ref 11.5–15.5)
WBC: 7.7 10*3/uL (ref 4.0–10.5)

## 2017-01-18 LAB — I-STAT CHEM 8, ED
BUN: 20 mg/dL (ref 6–20)
CALCIUM ION: 1.26 mmol/L (ref 1.15–1.40)
CHLORIDE: 101 mmol/L (ref 101–111)
CREATININE: 1.2 mg/dL (ref 0.61–1.24)
GLUCOSE: 89 mg/dL (ref 65–99)
HCT: 31 % — ABNORMAL LOW (ref 39.0–52.0)
Hemoglobin: 10.5 g/dL — ABNORMAL LOW (ref 13.0–17.0)
Potassium: 4.3 mmol/L (ref 3.5–5.1)
SODIUM: 133 mmol/L — AB (ref 135–145)
TCO2: 22 mmol/L (ref 22–32)

## 2017-01-18 LAB — BRAIN NATRIURETIC PEPTIDE: B Natriuretic Peptide: 211.5 pg/mL — ABNORMAL HIGH (ref 0.0–100.0)

## 2017-01-18 LAB — COMPREHENSIVE METABOLIC PANEL
ALBUMIN: 3.8 g/dL (ref 3.5–5.0)
ALK PHOS: 68 U/L (ref 38–126)
ALT: 13 U/L — AB (ref 17–63)
AST: 18 U/L (ref 15–41)
Anion gap: 6 (ref 5–15)
BILIRUBIN TOTAL: 0.4 mg/dL (ref 0.3–1.2)
BUN: 22 mg/dL — ABNORMAL HIGH (ref 6–20)
CALCIUM: 9.5 mg/dL (ref 8.9–10.3)
CO2: 22 mmol/L (ref 22–32)
CREATININE: 1.27 mg/dL — AB (ref 0.61–1.24)
Chloride: 104 mmol/L (ref 101–111)
GFR calc Af Amer: 57 mL/min — ABNORMAL LOW (ref >60)
GFR calc non Af Amer: 49 mL/min — ABNORMAL LOW (ref >60)
GLUCOSE: 91 mg/dL (ref 65–99)
Potassium: 4.4 mmol/L (ref 3.5–5.1)
SODIUM: 132 mmol/L — AB (ref 135–145)
TOTAL PROTEIN: 6.9 g/dL (ref 6.5–8.1)

## 2017-01-18 LAB — RAPID URINE DRUG SCREEN, HOSP PERFORMED
AMPHETAMINES: NOT DETECTED
BARBITURATES: NOT DETECTED
Benzodiazepines: NOT DETECTED
Cocaine: NOT DETECTED
Opiates: NOT DETECTED
TETRAHYDROCANNABINOL: NOT DETECTED

## 2017-01-18 LAB — I-STAT TROPONIN, ED
Troponin i, poc: 0.01 ng/mL (ref 0.00–0.08)
Troponin i, poc: 0 ng/mL (ref 0.00–0.08)

## 2017-01-18 LAB — CBG MONITORING, ED: Glucose-Capillary: 87 mg/dL (ref 65–99)

## 2017-01-18 LAB — TSH: TSH: 1.809 u[IU]/mL (ref 0.350–4.500)

## 2017-01-18 LAB — MAGNESIUM: Magnesium: 2 mg/dL (ref 1.7–2.4)

## 2017-01-18 LAB — PROTIME-INR
INR: 1.09
PROTHROMBIN TIME: 14 s (ref 11.4–15.2)

## 2017-01-18 LAB — APTT: aPTT: 30 seconds (ref 24–36)

## 2017-01-18 LAB — ETHANOL: Alcohol, Ethyl (B): <10 mg/dL (ref <10)

## 2017-01-18 MED ORDER — ALBUTEROL SULFATE (2.5 MG/3ML) 0.083% IN NEBU: 2.5000 mg | INHALATION_SOLUTION | RESPIRATORY_TRACT | Status: DC | PRN

## 2017-01-18 MED ORDER — ENOXAPARIN SODIUM 40 MG/0.4ML ~~LOC~~ SOLN
40.0000 mg | SUBCUTANEOUS | Status: DC
  Administered 2017-01-19 – 2017-01-20 (×2): 40 mg via SUBCUTANEOUS
  Filled 2017-01-18 (×2): qty 0.4

## 2017-01-18 MED ORDER — ASPIRIN 81 MG PO CHEW
81.0000 mg | CHEWABLE_TABLET | Freq: Every day | ORAL | Status: DC
  Administered 2017-01-19 – 2017-01-20 (×2): 81 mg via ORAL
  Filled 2017-01-18 (×2): qty 1

## 2017-01-18 MED ORDER — LEVOTHYROXINE SODIUM 112 MCG PO TABS
112.0000 ug | ORAL_TABLET | Freq: Every day | ORAL | Status: DC
  Administered 2017-01-19 – 2017-01-20 (×2): 112 ug via ORAL
  Filled 2017-01-18 (×2): qty 1

## 2017-01-18 MED ORDER — PANTOPRAZOLE SODIUM 40 MG PO TBEC
40.0000 mg | DELAYED_RELEASE_TABLET | Freq: Every day | ORAL | Status: DC
  Administered 2017-01-19 – 2017-01-20 (×2): 40 mg via ORAL
  Filled 2017-01-18 (×2): qty 1

## 2017-01-18 MED ORDER — POLYETHYL GLYCOL-PROPYL GLYCOL 0.4-0.3 % OP GEL
Freq: Every day | OPHTHALMIC | Status: DC | PRN
  Filled 2017-01-18: qty 10

## 2017-01-18 MED ORDER — GABAPENTIN 100 MG PO CAPS
100.0000 mg | ORAL_CAPSULE | Freq: Every day | ORAL | Status: DC
  Administered 2017-01-18 – 2017-01-19 (×2): 100 mg via ORAL
  Filled 2017-01-18 (×2): qty 1

## 2017-01-18 MED ORDER — POLYETHYLENE GLYCOL 3350 17 G PO PACK: 17.0000 g | PACK | Freq: Every day | ORAL | Status: DC | PRN

## 2017-01-18 MED ORDER — FLUTICASONE PROPIONATE 50 MCG/ACT NA SUSP
1.0000 | Freq: Every day | NASAL | Status: DC | PRN
  Filled 2017-01-18: qty 16

## 2017-01-18 MED ORDER — LORATADINE 10 MG PO TABS: 10.0000 mg | ORAL_TABLET | Freq: Every day | ORAL | Status: DC | PRN

## 2017-01-18 MED ORDER — MIRABEGRON ER 25 MG PO TB24
50.0000 mg | ORAL_TABLET | Freq: Every day | ORAL | Status: DC
  Administered 2017-01-19 – 2017-01-20 (×2): 50 mg via ORAL
  Filled 2017-01-18 (×2): qty 2

## 2017-01-18 MED ORDER — POLYVINYL ALCOHOL 1.4 % OP SOLN
1.0000 [drp] | OPHTHALMIC | Status: DC | PRN
  Filled 2017-01-18: qty 15

## 2017-01-18 MED ORDER — STROKE: EARLY STAGES OF RECOVERY BOOK
Freq: Once | Status: DC
  Filled 2017-01-18: qty 1

## 2017-01-18 MED ORDER — FLUTICASONE FUROATE-VILANTEROL 100-25 MCG/INH IN AEPB
1.0000 | INHALATION_SPRAY | Freq: Every day | RESPIRATORY_TRACT | Status: DC
  Filled 2017-01-18: qty 28

## 2017-01-18 MED ORDER — MEGESTROL ACETATE 20 MG PO TABS: 20.0000 mg | ORAL_TABLET | ORAL | Status: DC

## 2017-01-18 MED ORDER — MONTELUKAST SODIUM 10 MG PO TABS
10.0000 mg | ORAL_TABLET | Freq: Every day | ORAL | Status: DC
  Administered 2017-01-19: 10 mg via ORAL
  Filled 2017-01-18: qty 1

## 2017-01-18 MED ORDER — ACETAMINOPHEN 325 MG PO TABS: 650.0000 mg | ORAL_TABLET | ORAL | Status: DC | PRN

## 2017-01-18 MED ORDER — SODIUM CHLORIDE 0.9 % IV SOLN
INTRAVENOUS | Status: DC
  Administered 2017-01-18: 22:00:00 via INTRAVENOUS

## 2017-01-18 MED ORDER — POLYSACCHARIDE IRON COMPLEX 150 MG PO CAPS
150.0000 mg | ORAL_CAPSULE | Freq: Every day | ORAL | Status: DC
  Administered 2017-01-19 – 2017-01-20 (×2): 150 mg via ORAL
  Filled 2017-01-18 (×2): qty 1

## 2017-01-18 MED ORDER — ACETAMINOPHEN 160 MG/5ML PO SOLN: 650.0000 mg | ORAL | Status: DC | PRN

## 2017-01-18 MED ORDER — ACETAMINOPHEN 650 MG RE SUPP: 650.0000 mg | RECTAL | Status: DC | PRN

## 2017-01-18 MED ORDER — SODIUM CHLORIDE 0.9 % IV BOLUS (SEPSIS)
500.0000 mL | Freq: Once | INTRAVENOUS | Status: AC
  Administered 2017-01-18: 500 mL via INTRAVENOUS

## 2017-01-18 NOTE — Progress Notes (Signed)
EEG complete - results pending 

## 2017-01-18 NOTE — ED Notes (Signed)
Bed: WA14 Expected date:  Expected time:  Means of arrival:  Comments: EMS/syncope 

## 2017-01-18 NOTE — ED Notes (Signed)
Letta Kocher [id number]@ 15:19

## 2017-01-18 NOTE — Consult Note (Signed)
Neurology Consultation Reason for Consult: Syncope Referring Physician: Carolin Sicks, D  CC: Syncope  History is obtained from: Patient, chart  HPI: Randy Sullivan is a 83 y.o. male who presents with syncope followed by expressive aphasia.  Apparently, after having breakfast he had an episode of loss of consciousness.  No seizure-like activity, no loss of bowel or bladder.  He apparently has been having confusion on and off for the past few days.  On arrival to the ER, he had expressive aphasia which improved over the course of several hours.  There was concern for TIA and therefore he has been admitted to [facility].  On my examination, the patient is amnestic to the events of today.  He appears to be mildly confused.  ROS: A 14 point ROS was performed and is negative except as noted in the HPI.   Past Medical History:  Diagnosis Date  . BPH (benign prostatic hyperplasia)   . Cancer ([place])    non melanoma skin cancers removed  . DJD (degenerative joint disease)   . Familial tremor   . GERD (gastroesophageal reflux disease)   . Heart murmur    hx of  . Hyperlipidemia   . Hypertension   . Hypothyroidism   . Mood disorder ([place])   . Osteoarthritis   . Prostatism   . Shortness of breath    mild with excertion  . Sleep apnea    cpap  . Syncope 01/13/2017  . Thyroid disease      Family History  Problem Relation Age of Onset  . Heart attack Father   . Colon cancer Mother   . Colon cancer Sister      Social History:  reports that he quit smoking about 46 years ago. His smoking use included cigarettes. He has a 36.00 pack-year smoking history. he has never used smokeless tobacco. He reports that he drinks alcohol. He reports that he does not use drugs.   Exam: Current vital signs: BP (!) 177/95 (BP Location: Right Arm)   Pulse 87   Temp (!) 97.5 F (36.4 C) (Oral)   Resp 20   SpO2 97%  Vital signs in last 24 hours: Temp:  [97.5 F (36.4 C)-98.9 F (37.2 C)]  97.5 F (36.4 C) (11/27 2230) Pulse Rate:  [77-88] 87 (11/27 1840) Resp:  [11-20] 20 (11/27 2230) BP: (144-186)/(81-105) 177/95 (11/27 2230) SpO2:  [96 %-99 %] 97 % (11/27 2230)   Physical Exam  Constitutional: Appears well-developed and well-nourished.  Psych: Affect appropriate to situation Eyes: No scleral injection HENT: No OP obstrucion Head: Normocephalic.  Cardiovascular: Normal rate and regular rhythm.  Respiratory: Effort normal and breath sounds normal to anterior ascultation GI: Soft.  No distension. There is no tenderness.  Skin: WDI  Neuro: Mental Status: Patient is awake, alert, oriented to person, place, month.  He is unable to tell me the year He appears to have a mild expressive aphasia, with some difficulty with word finding. Cranial Nerves: II: Visual Fields are full. Pupils are equal, round, and reactive to light.   III,IV, VI: EOMI without ptosis or diploplia.  V: Facial sensation is symmetric to temperature VII: Facial movement is symmetric.  VIII: hearing is intact to voice X: Uvula elevates symmetrically XI: Shoulder shrug is symmetric. XII: tongue is midline without atrophy or fasciculations.  Motor: Tone is normal. Bulk is normal. 5/5 strength was present in all four extremities.  Sensory: Sensation is symmetric to light touch and temperature in the arms  and legs. Cerebellar: He has postural and intentional tremor bilaterally  I have reviewed labs in epic and the results pertinent to this consultation are: Mild hyponatremia at 132, creatinine 1.27 Urinalysis is negative CBC without leukocytosis  I have reviewed the images obtained: CT head is negative  Impression: 83 year old male with recurrent episodes of syncope as well as recurrent episodes of confusion.  My suspicion for TIA is actually low, I think that more likely this represents syncope.  His confusion afterwards could be suggestive of seizure, however especially if he has some mild  cognitive problems at baseline, this would not be specific for seizure and could be seen in other causes of syncope.   Recommendations: 1) MRI brain 2) EEG 3) ammonia, TSH 4) B12 5) neurology will continue to follow  Roland Rack, MD [facility] [phone]  If 7pm- 7am, please page neurology on call as listed in [place].

## 2017-01-18 NOTE — ED Notes (Signed)
Patient transported to CT 

## 2017-01-18 NOTE — Care Management Obs Status (Signed)
[facility] NOTIFICATION   Patient Details  Name: Randy Sullivan MRN: [id number] Date of Birth: September 13, 1928   Medicare Observation Status Notification Given:  Yes    Senai Kingsley, Benjaman Lobe, RN 01/18/2017, 2:51 PM

## 2017-01-18 NOTE — ED Notes (Signed)
EEG tech at bedside. Will obtain orthostatic vital sings when testing is complete.

## 2017-01-18 NOTE — ED Notes (Signed)
Staff who is completing EEG reports the test will take one hour to complete.

## 2017-01-18 NOTE — ED Notes (Signed)
EEG is being performed at bedside.

## 2017-01-18 NOTE — ED Notes (Signed)
MRI called stating they were coming to get patient

## 2017-01-18 NOTE — ED Triage Notes (Signed)
Per [facility] patient from home for syncopal episode while eating breakfast this morning. Patient was lowered to ground by wife, who reported patient was out for couple minutes. EMS reports that patient is alert and but confused and unsure of this is off patient's baseline. patietn has  20G left forearm Vitals: 150/117, 84HR, 16R, 96% on room air, CBG 143.

## 2017-01-18 NOTE — H&P (Addendum)
History and Physical    Randy Sullivan DOB: [birth year] DOA: 01/18/2017  PCP: Prince Solian, MD  Patient coming from:Home  Chief Complaint:syncopal episode  HPI: Randy Sullivan is a 83 y.o. male with medical history significant of hypertension, hypothyroidism, hyperlipidemia, BPH, OSA on CPAP brought in for further evaluation of syncopal episode this morning.  Patient's wife reported that after having breakfast patient suddenly passed out lasted for about 5 minutes.  Patient denied any prodromal symptoms.  No bowel or urinary incontinence nor any seizure-like activity as per patient's wife.  Patient was recently admitted at [facility] for similar issue when echocardiogram and a carotid Doppler was done which were unremarkable.  This is patient's recurrent issue.  Denied headache, dizziness, nausea vomiting, chest pain, shortness of breath.  No dysuria, urgency, diarrhea or constipation.  ED Course: In the ER, patient was found to have expressive aphasia as per ER physician.  CT scan of head with no acute finding.  Neurologist was contacted and recommended to admit patient to [facility] for further evaluation of syncope.  Review of Systems: As per HPI otherwise 10 point review of systems negative.    Past Medical History:  Diagnosis Date  . BPH (benign prostatic hyperplasia)   . Cancer ([place])    non melanoma skin cancers removed  . DJD (degenerative joint disease)   . Familial tremor   . GERD (gastroesophageal reflux disease)   . Heart murmur    hx of  . Hyperlipidemia   . Hypertension   . Hypothyroidism   . Mood disorder ([place])   . Osteoarthritis   . Prostatism   . Shortness of breath    mild with excertion  . Sleep apnea    cpap  . Syncope 01/13/2017  . Thyroid disease     Past Surgical History:  Procedure Laterality Date  . APPENDECTOMY    . EYE SURGERY     bil cataracts  . HEMORROIDECTOMY    . TONSILLECTOMY    . TOTAL KNEE ARTHROPLASTY Left 11/08/2016     Procedure: LEFT TOTAL KNEE ARTHROPLASTY;  Surgeon: Gaynelle Arabian, MD;  Location: [facility] ORS;  Service: Orthopedics;  Laterality: Left;  Adductor Block    Social history: Reports that he quit smoking about 46 years ago. His smoking use included cigarettes. He has a 36.00 pack-year smoking history. he has never used smokeless tobacco. He reports that he drinks alcohol. He reports that he does not use drugs.  Allergies  Allergen Reactions  . Tizanidine Other (See Comments)    Hypotension     Family History  Problem Relation Age of Onset  . Heart attack Father   . Colon cancer Mother   . Colon cancer Sister      Prior to Admission medications   Medication Sig Start Date End Date Taking? Authorizing Provider  aspirin 81 MG chewable tablet Chew 1 tablet (81 mg total) by mouth daily. 01/16/17  Yes Regalado, Belkys A, MD  fluticasone furoate-vilanterol (BREO ELLIPTA) 100-25 MCG/INH AEPB Inhale 1 puff into the lungs daily. Patient taking differently: Inhale 1 puff into the lungs at bedtime.  08/20/16  Yes Parrett, Tammy S, NP  gabapentin (NEURONTIN) 100 MG capsule Take 100 mg by mouth at bedtime. 10/28/16  Yes [provider]  iron polysaccharides (NIFEREX) 150 MG capsule Take 150 mg by mouth daily.   Yes [provider]  levothyroxine (SYNTHROID, LEVOTHROID) 112 MCG tablet Take 112 mcg by mouth daily before breakfast.  10/14/14  Yes [provider]  loratadine (CLARITIN) 10 MG tablet Take 10 mg by mouth daily as needed for allergies.    Yes [provider]  megestrol (MEGACE) 20 MG tablet Take 20-40 mg by mouth See admin instructions. Take 2 tablets (40mg ) before breakfast and 1 tablet (20mg ) before supper.   Yes [provider]  montelukast (SINGULAIR) 10 MG tablet Take 1 tablet (10 mg total) by mouth at bedtime. 09/07/16  Yes Parrett, Tammy S, NP  MYRBETRIQ 50 MG TB24 tablet Take 50 mg by mouth daily.  06/03/15  Yes [provider]  omeprazole  (PRILOSEC) 20 MG capsule Take 20 mg by mouth 2 (two) times daily before a meal.    Yes [provider]  Polyethyl Glycol-Propyl Glycol (SYSTANE OP) Apply 1-2 drops to eye daily as needed (dry eyes).    Yes [provider]  polyethylene glycol (MIRALAX / GLYCOLAX) packet Take 17 g by mouth daily as needed for mild constipation.   Yes [provider]  propranolol (INDERAL) 20 MG tablet Take 20 mg by mouth daily.  06/18/16  Yes [provider]  rosuvastatin (CRESTOR) 5 MG tablet Take 5 mg by mouth at bedtime.  06/23/16  Yes [provider]  solifenacin (VESICARE) 5 MG tablet Take 5 mg by mouth every evening.   Yes [provider]  albuterol (PROVENTIL HFA;VENTOLIN HFA) 108 (90 Base) MCG/ACT inhaler Inhale 2 puffs into the lungs every 4 (four) hours as needed for wheezing or shortness of breath. 01/21/16   de Dios, [place] A, MD  fluticasone [facility]) 50 MCG/ACT nasal spray Place 1 spray into both nostrils daily as needed for allergies.     [provider]    Physical Exam: Vitals:   01/18/17 1007 01/18/17 1051 01/18/17 1200 01/18/17 1400  BP: (!) 144/86  (!) 148/81 (!) 152/88  Pulse: 88  87 81  Resp: 15  19 15   Temp: 98 F (36.7 C) 98 F (36.7 C)    SpO2: 99%         Constitutional: NAD, calm, comfortable Vitals:   01/18/17 1007 01/18/17 1051 01/18/17 1200 01/18/17 1400  BP: (!) 144/86  (!) 148/81 (!) 152/88  Pulse: 88  87 81  Resp: 15  19 15   Temp: 98 F (36.7 C) 98 F (36.7 C)    SpO2: 99%      Eyes: PERRL, lids and conjunctivae normal ENMT: Mucous membranes are moist. Normal dentition.  Neck: normal, supple Respiratory: clear to auscultation bilaterally, no wheezing, no crackles. Normal respiratory effort. No accessory muscle use.  Cardiovascular: Regular rate and rhythm, S1S2 Normal. no murmurs / rubs / gallops. No extremity edema.   Abdomen: Soft, Non-tender, non-distended. Bowel sounds positive.    Musculoskeletal: no clubbing / cyanosis. No joint deformity upper and lower extremities. Skin: no rashes, lesions, ulcers. No induration Neurologic: CN 2-12 grossly intact. Sensation intact.  Strength 5/5 in all 4.  Psychiatric: Normal judgment and insight. Alert and oriented x 3. Normal mood.    Labs on Admission: I have personally reviewed following labs and imaging studies  CBC: Recent Labs  Lab 01/13/17 1045 01/18/17 1138 01/18/17 1150  WBC 7.2 7.7  --   NEUTROABS 3.4 3.9  --   HGB 11.2* 10.9* 10.5*  HCT 32.9* 31.7* 31.0*  MCV 91.4 90.3  --   PLT 248 255  --    Basic Metabolic Panel: Recent Labs  Lab 01/13/17 1135 01/14/17 0509 01/18/17 1138 01/18/17 1150  NA 132*  --  132* 133*  K 4.4  --  4.4 4.3  CL 103  --  104 101  CO2 23  --  22  --   GLUCOSE 88  --  91 89  BUN 23*  --  22* 20  CREATININE 1.32*  --  1.27* 1.20  CALCIUM 9.2  --  9.5  --   MG  --  1.9 2.0  --    GFR: Estimated Creatinine Clearance: 48.5 mL/min (by C-G formula based on SCr of 1.2 mg/dL). Liver Function Tests: Recent Labs  Lab 01/13/17 1135 01/18/17 1138  AST 17 18  ALT 12* 13*  ALKPHOS 62 68  BILITOT 0.7 0.4  PROT 6.4* 6.9  ALBUMIN 3.6 3.8   No results for input(s): LIPASE, AMYLASE in the last 168 hours. No results for input(s): AMMONIA in the last 168 hours. Coagulation Profile: Recent Labs  Lab 01/18/17 1138  INR 1.09   Cardiac Enzymes: Recent Labs  Lab 01/13/17 1135 01/13/17 1651 01/13/17 2207 01/14/17 0509  TROPONINI 0.03* 0.03* 0.03* 0.03*   BNP (last 3 results) No results for input(s): PROBNP in the last 8760 hours. HbA1C: No results for input(s): HGBA1C in the last 72 hours. CBG: Recent Labs  Lab 01/13/17 1052 01/18/17 1022  GLUCAP 90 87   Lipid Profile: No results for input(s): CHOL, HDL, LDLCALC, TRIG, CHOLHDL, LDLDIRECT in the last 72 hours. Thyroid Function Tests: Recent Labs    01/18/17 1138  TSH 1.809   Anemia Panel: No results for input(s):  VITAMINB12, FOLATE, FERRITIN, TIBC, IRON, RETICCTPCT in the last 72 hours. Urine analysis:    Component Value Date/Time   COLORURINE STRAW (A) 01/18/2017 1138   APPEARANCEUR CLEAR 01/18/2017 1138   LABSPEC 1.008 01/18/2017 1138   PHURINE 7.0 01/18/2017 1138   GLUCOSEU NEGATIVE 01/18/2017 1138   HGBUR NEGATIVE 01/18/2017 1138   BILIRUBINUR NEGATIVE 01/18/2017 1138   KETONESUR NEGATIVE 01/18/2017 1138   PROTEINUR NEGATIVE 01/18/2017 1138   NITRITE NEGATIVE 01/18/2017 1138   LEUKOCYTESUR NEGATIVE 01/18/2017 1138   Sepsis Labs: !!!!!!!!!!!!!!!!!!!!!!!!!!!!!!!!!!!!!!!!!!!! @LABRCNTIP (procalcitonin:4,lacticidven:4) )No results found for this or any previous visit (from the past 240 hour(s)).   Radiological Exams on Admission: Dg Chest 2 View  Result Date: 01/18/2017 CLINICAL DATA:  Syncope, altered mental status EXAM: CHEST  2 VIEW COMPARISON:  01/13/2017 FINDINGS: Heart is borderline in size. Mild vascular congestion. Low lung volumes. No confluent opacities or effusions. No acute bony abnormality. IMPRESSION: Low lung volumes.  Mild vascular congestion. Electronically Signed   By: Rolm Baptise M.D.   On: 01/18/2017 11:17   Ct Head Wo Contrast  Result Date: 01/18/2017 CLINICAL DATA:  Altered level of consciousness. EXAM: CT HEAD WITHOUT CONTRAST TECHNIQUE: Contiguous axial images were obtained from the base of the skull through the vertex without intravenous contrast. COMPARISON:  CT scan of November 21, 2016. FINDINGS: Brain: Mild chronic ischemic white matter disease is noted. Minimal diffuse cortical atrophy is noted. No mass effect or midline shift is noted. Ventricular size is within normal limits. There is no evidence of mass lesion, hemorrhage or acute infarction. Vascular: No hyperdense vessel or unexpected calcification. Skull: Normal. Negative for fracture or focal lesion. Sinuses/Orbits: No acute finding. Other: None. IMPRESSION: Mild chronic ischemic white matter disease. No  acute intracranial abnormality seen. Electronically Signed   By: Marijo Conception, M.D.   On: 01/18/2017 11:30    EKG: Independently reviewed.  Sinus rhythm  Assessment/Plan Active Problems:   Syncope  #Recurrent  syncopal episode of unknown etiology: -Patient reported 2 prior episodes of syncope.  He was admitted last week for similar problem when workup was unremarkable.  Concerning for TIA versus seizure versus arrhythmias versus orthostatic hypotension or vasovagal episodes.  Given expressive aphasia during arrival to ER concerning for TIA. -CT scan of head unremarkable for acute finding. -Check orthostatic blood pressure -Echo and carotid Doppler was done recently which were unremarkable -unable to do MRI and MRA of head because of inner ear implant, d/w MRI tech. Defer to neurologist for further imaging studies.  -Check LDL and A1c level -Order EEG -Continue aspirin, may benefit from a statin pending lipid level. -PT, OT evaluation and supportive care -Neurology is already contacted.  Neurologist recommended to transfer patient to [facility], follow-up recommendation.  #Hypertension: Monitor blood pressure.  Avoid hypotensive episode.  # Hypothyroidism: Continue Synthroid.  TSH level acceptable.  Continue home medication and supportive care.  Patient likely needs Holter on discharge.  Follow-up test results.  Plan discussed with the patient and his wife at bedside DVT prophylaxis: Lovenox subcutaneous Code Status: Full code Family Communication: Discussed with the patient's wife and ER Disposition Plan: Transferred to [facility] telemetry floor Consults called: Neurologist Admission status:   Idonia Zollinger Tanna Furry MD [facility] Pager [phone]  If 7PM-7AM, please contact night-coverage [email or web address] Password [email or web address]  01/18/2017, 2:19 PM

## 2017-01-18 NOTE — ED Provider Notes (Signed)
[facility] DEPT Provider Note   CSN: [id number] Arrival date & time: 01/18/17  0957     History   Chief Complaint Chief Complaint  Patient presents with  . Loss of Consciousness    HPI Randy Sullivan is a 83 y.o. male.  The history is provided by the patient, the spouse and medical records. No language interpreter was used.  Loss of Consciousness   This is a recurrent problem. The current episode started 1 to 2 hours ago. The problem occurs constantly. The problem has been resolved. He lost consciousness for a period of 1 to 5 minutes. Associated with: after eating. Associated symptoms include confusion and focal sensory loss. Pertinent negatives include abdominal pain, back pain, chest pain, congestion, diaphoresis, dizziness, fever, focal weakness, headaches, light-headedness, malaise/fatigue, nausea, palpitations, vertigo, visual change, vomiting and weakness.  Neurologic Problem  Episode onset: unclear. The problem has been resolved. Pertinent negatives include no chest pain, no abdominal pain, no headaches and no shortness of breath. Nothing aggravates the symptoms. Nothing relieves the symptoms. He has tried nothing for the symptoms.    Past Medical History:  Diagnosis Date  . BPH (benign prostatic hyperplasia)   . Cancer ([place])    non melanoma skin cancers removed  . DJD (degenerative joint disease)   . Familial tremor   . GERD (gastroesophageal reflux disease)   . Heart murmur    hx of  . Hyperlipidemia   . Hypertension   . Hypothyroidism   . Mood disorder ([place])   . Osteoarthritis   . Prostatism   . Shortness of breath    mild with excertion  . Sleep apnea    cpap  . Syncope 01/13/2017  . Thyroid disease     Patient Active Problem List   Diagnosis Date Noted  . Syncope 01/13/2017  . CKD (chronic kidney disease), stage III ([place]) 01/13/2017  . OA (osteoarthritis) of knee 11/08/2016  . Essential hypertension 02/05/2016  .  Thoracic aortic atherosclerosis ([place]) 02/05/2016  . Benign familial tremor 02/05/2016  . COPD (chronic obstructive pulmonary disease) ([place]) 01/21/2016  . OSA (obstructive sleep apnea) 10/23/2015  . Upper airway cough syndrome 10/23/2015  . Exertional dyspnea 10/23/2015  . RLS (restless legs syndrome) 10/23/2015  . Osteoarthritis of knee 06/04/2010  . Dizziness 06/04/2010  . Benign hypertensive heart disease without heart failure 06/04/2010  . Hypercholesterolemia 06/04/2010  . BPH (benign prostatic hyperplasia) 06/04/2010    Past Surgical History:  Procedure Laterality Date  . APPENDECTOMY    . EYE SURGERY     bil cataracts  . HEMORROIDECTOMY    . TONSILLECTOMY    . TOTAL KNEE ARTHROPLASTY Left 11/08/2016   Procedure: LEFT TOTAL KNEE ARTHROPLASTY;  Surgeon: Gaynelle Arabian, MD;  Location: [facility] ORS;  Service: Orthopedics;  Laterality: Left;  Adductor Block       Home Medications    Prior to Admission medications   Medication Sig Start Date End Date Taking? Authorizing Provider  albuterol (PROVENTIL HFA;VENTOLIN HFA) 108 (90 Base) MCG/ACT inhaler Inhale 2 puffs into the lungs every 4 (four) hours as needed for wheezing or shortness of breath. 01/21/16   de Dios, [place] A, MD  aspirin 81 MG chewable tablet Chew 1 tablet (81 mg total) by mouth daily. 01/16/17   Regalado, Belkys A, MD  fluticasone (FLONASE) 50 MCG/ACT nasal spray Place 1 spray into both nostrils daily as needed for allergies.     [provider]  fluticasone furoate-vilanterol (BREO ELLIPTA) 100-25  MCG/INH AEPB Inhale 1 puff into the lungs daily. Patient taking differently: Inhale 1 puff into the lungs at bedtime.  08/20/16   Parrett, Fonnie Mu, NP  gabapentin (NEURONTIN) 100 MG capsule Take 100 mg by mouth at bedtime. 10/28/16   [provider]  iron polysaccharides (NIFEREX) 150 MG capsule Take 150 mg by mouth daily.    [provider]  levothyroxine (SYNTHROID, LEVOTHROID) 112 MCG tablet  Take 112 mcg by mouth daily before breakfast.  10/14/14   [provider]  loratadine (CLARITIN) 10 MG tablet Take 10 mg by mouth daily as needed for allergies.     [provider]  megestrol (MEGACE) 20 MG tablet Take 20-40 mg by mouth See admin instructions. Take 2 tablets (40mg ) before breakfast and 1 tablet (20mg ) before supper.    [provider]  montelukast (SINGULAIR) 10 MG tablet Take 1 tablet (10 mg total) by mouth at bedtime. 09/07/16   Parrett, Fonnie Mu, NP  MYRBETRIQ 50 MG TB24 tablet Take 50 mg by mouth daily.  06/03/15   [provider]  omeprazole (PRILOSEC) 20 MG capsule Take 20 mg by mouth 2 (two) times daily before a meal.     [provider]  Polyethyl Glycol-Propyl Glycol (SYSTANE OP) Apply 1-2 drops to eye daily as needed (dry eyes).     [provider]  polyethylene glycol (MIRALAX / GLYCOLAX) packet Take 17 g by mouth daily as needed for mild constipation.    [provider]  propranolol (INDERAL) 20 MG tablet Take 20 mg by mouth daily.  06/18/16   [provider]  rosuvastatin (CRESTOR) 5 MG tablet Take 5 mg by mouth at bedtime.  06/23/16   [provider]  solifenacin (VESICARE) 5 MG tablet Take 5 mg by mouth every evening.    [provider]    Family History Family History  Problem Relation Age of Onset  . Heart attack Father   . Colon cancer Mother   . Colon cancer Sister     Social History Social History   Tobacco Use  . Smoking status: Former Smoker    Packs/day: 2.00    Years: 18.00    Pack years: 36.00    Types: Cigarettes    Last attempt to quit: 06/04/1970    Years since quitting: 46.6  . Smokeless tobacco: Never Used  Substance Use Topics  . Alcohol use: Yes    Alcohol/week: 0.0 oz    Comment: special occasions  . Drug use: No     Allergies   Tizanidine   Review of Systems Review of Systems  Constitutional: Negative for chills, diaphoresis, fatigue,  fever and malaise/fatigue.  HENT: Negative for congestion.   Respiratory: Negative for cough, chest tightness, shortness of breath, wheezing and stridor.   Cardiovascular: Positive for syncope. Negative for chest pain, palpitations and leg swelling.  Gastrointestinal: Negative for abdominal pain, constipation, diarrhea, nausea and vomiting.  Genitourinary: Negative for flank pain.  Musculoskeletal: Negative for back pain, neck pain and neck stiffness.  Neurological: Positive for syncope. Negative for dizziness, vertigo, focal weakness, weakness, light-headedness and headaches.  Psychiatric/Behavioral: Positive for confusion. Negative for agitation.  All other systems reviewed and are negative.    Physical Exam Updated Vital Signs There were no vitals taken for this visit.  Physical Exam  Constitutional: He appears well-developed and well-nourished. No distress.  HENT:  Head: Normocephalic.  Mouth/Throat: Oropharynx is clear and moist. No oropharyngeal exudate.  Eyes: Conjunctivae and EOM are  normal. Pupils are equal, round, and reactive to light.  Neck: Normal range of motion.  Cardiovascular: Normal rate and intact distal pulses.  No murmur heard. Pulmonary/Chest: Effort normal. No stridor. No respiratory distress. He has no wheezes. He exhibits no tenderness.  Abdominal: There is no tenderness.  Musculoskeletal: He exhibits no edema or tenderness.  Neurological: He is alert. He is disoriented. He displays tremor. He displays normal reflexes. No cranial nerve deficit or sensory deficit. He exhibits normal muscle tone. GCS eye subscore is 4. GCS verbal subscore is 4. GCS motor subscore is 6.  Skin: Skin is warm. Capillary refill takes less than 2 seconds. He is not diaphoretic.  Nursing note and vitals reviewed.    ED Treatments / Results  Labs (all labs ordered are listed, but only abnormal results are displayed) Labs Reviewed  CBC - Abnormal; Notable for the following  components:      Result Value   RBC 3.51 (*)    Hemoglobin 10.9 (*)    HCT 31.7 (*)    All other components within normal limits  COMPREHENSIVE METABOLIC PANEL - Abnormal; Notable for the following components:   Sodium 132 (*)    BUN 22 (*)    Creatinine, Ser 1.27 (*)    ALT 13 (*)    GFR calc non Af Amer 49 (*)    GFR calc Af Amer 57 (*)    All other components within normal limits  URINALYSIS, ROUTINE W REFLEX MICROSCOPIC - Abnormal; Notable for the following components:   Color, Urine STRAW (*)    All other components within normal limits  BRAIN NATRIURETIC PEPTIDE - Abnormal; Notable for the following components:   B Natriuretic Peptide 211.5 (*)    All other components within normal limits  I-STAT CHEM 8, ED - Abnormal; Notable for the following components:   Sodium 133 (*)    Hemoglobin 10.5 (*)    HCT 31.0 (*)    All other components within normal limits  ETHANOL  PROTIME-INR  APTT  DIFFERENTIAL  RAPID URINE DRUG SCREEN, HOSP PERFORMED  TSH  MAGNESIUM  HEMOGLOBIN A1C  LIPID PANEL  CBG MONITORING, ED  I-STAT TROPONIN, ED  I-STAT TROPONIN, ED    EKG  EKG Interpretation  Date/Time:  Tuesday January 18 2017 10:12:20 EST Ventricular Rate:  86 PR Interval:    QRS Duration: 98 QT Interval:  384 QTC Calculation: 460 R Axis:   44 Text Interpretation:  Sinus rhythm Low voltage, extremity leads When compared to prior,no significant changes seen.  No STEMI Confirmed by Antony Blackbird [phone]) on 01/18/2017 10:47:48 AM Also confirmed by Antony Blackbird [phone]), editor Laurena Spies [phone])  on 01/18/2017 10:56:33 AM       Radiology Dg Chest 2 View  Result Date: 01/18/2017 CLINICAL DATA:  Syncope, altered mental status EXAM: CHEST  2 VIEW COMPARISON:  01/13/2017 FINDINGS: Heart is borderline in size. Mild vascular congestion. Low lung volumes. No confluent opacities or effusions. No acute bony abnormality. IMPRESSION: Low lung volumes.  Mild vascular congestion.  Electronically Signed   By: Rolm Baptise M.D.   On: 01/18/2017 11:17   Ct Head Wo Contrast  Result Date: 01/18/2017 CLINICAL DATA:  Altered level of consciousness. EXAM: CT HEAD WITHOUT CONTRAST TECHNIQUE: Contiguous axial images were obtained from the base of the skull through the vertex without intravenous contrast. COMPARISON:  CT scan of November 21, 2016. FINDINGS: Brain: Mild chronic ischemic white matter disease is noted. Minimal diffuse cortical atrophy is noted.  No mass effect or midline shift is noted. Ventricular size is within normal limits. There is no evidence of mass lesion, hemorrhage or acute infarction. Vascular: No hyperdense vessel or unexpected calcification. Skull: Normal. Negative for fracture or focal lesion. Sinuses/Orbits: No acute finding. Other: None. IMPRESSION: Mild chronic ischemic white matter disease. No acute intracranial abnormality seen. Electronically Signed   By: Marijo Conception, M.D.   On: 01/18/2017 11:30    Procedures Procedures (including critical care time)  Medications Ordered in ED Medications  albuterol (PROVENTIL) (2.5 MG/3ML) 0.083% nebulizer solution 2.5 mg (not administered)  aspirin chewable tablet 81 mg (not administered)  fluticasone (FLONASE) 50 MCG/ACT nasal spray 1 spray (not administered)  fluticasone furoate-vilanterol (BREO ELLIPTA) 100-25 MCG/INH 1 puff (not administered)  gabapentin (NEURONTIN) capsule 100 mg (not administered)  iron polysaccharides (NIFEREX) capsule 150 mg (not administered)  levothyroxine (SYNTHROID, LEVOTHROID) tablet 112 mcg (not administered)  loratadine (CLARITIN) tablet 10 mg (not administered)  megestrol (MEGACE) tablet 20-40 mg (not administered)  montelukast (SINGULAIR) tablet 10 mg (not administered)  mirabegron ER (MYRBETRIQ) tablet 50 mg (not administered)  pantoprazole (PROTONIX) EC tablet 40 mg (not administered)  polyethylene glycol 0.4% and propylene glycol 0.3% (SYSTANE) ophthalmic gel (not  administered)  polyethylene glycol (MIRALAX / GLYCOLAX) packet 17 g (not administered)   stroke: mapping our early stages of recovery book (not administered)  acetaminophen (TYLENOL) tablet 650 mg (not administered)    Or  acetaminophen (TYLENOL) solution 650 mg (not administered)    Or  acetaminophen (TYLENOL) suppository 650 mg (not administered)  enoxaparin (LOVENOX) injection 40 mg (not administered)  0.9 %  sodium chloride infusion (not administered)  sodium chloride 0.9 % bolus 500 mL (0 mLs Intravenous Stopped 01/18/17 1141)     Initial Impression / Assessment and Plan / ED Course  I have reviewed the triage vital signs and the nursing notes.  Pertinent labs & imaging results that were available during my care of the patient were reviewed by me and considered in my medical decision making (see chart for details).     Randy Sullivan is a 83 y.o. male with a past medical history significant for sleep apnea on CPAP, hypertension, CKD, hyperlipidemia, hypothyroidism, COPD, and recent admission for syncope who presents with a syncopal episode and altered mental status.  History provided partially by patient and EMS.  No family was available at the bedside initially and no one answered the emergency contact phone numbers.  According to EMS report to nursing, patient had a recurrent syncopal episode at the breakfast table today.  He was gently laid to the ground and was unresponsive for several minutes.  Patient was then brought in by EMS.  Patient is unsure why he is in the emergency department.  He denies headache, vision changes, nausea, vomiting, chest pain, back pain, neck pain, shortness of breath, nausea, vomiting, urinary symptoms, or GI symptoms.  He denies palpitations.  He does not remember the syncopal episode.  He is able to answer yes and no to most questions.  When asked orientation questions, patient was giving inappropriate answers.  Patient appears to have expressive  aphasia.  On exam, patient had expressive aphasia.  Patient was only oriented to person.  Patient did not have slurred speech when saying his name.  The patient had a resting tremor in both upper extremities.  Patient could feel all extremities and move all extremities.  No apparent weakness.  Lungs were clear and chest was nontender.  Patient had  normal extra ocular movements and pupils were reactive bilaterally.  No facial droop. Abdomen nontender.  At time of initial evaluation, it is unclear last normal.  Patient does not remember the episode and does not remember what time he had a syncopal episode.  Patient is unable to answer questions of time of onset of his difficulty speaking.  No family answered the emergency contact list.  As time of onset is unknown and this patient has unknown baseline, will hold on code stroke.  Chart review does show that during his recent admission, patient was alert and oriented x3.  We will await arrival of family but will go ahead and obtain workup for syncope and possible stroke.  Anticipate speaking with neurology after workup.  10:46 AM Patient's wife arrived and was able to provide more information.  She says that he has been having confusion on and off for the last few days but was thinking clearly when he sat down for breakfast.  She reports that after breakfast, he slumped over and was unconscious for several minutes.  When I asked the patient, he is now answering questions clearly and is answering appropriately.  His expressive aphasia appears to have resolved.  Clinically I am concerned he may have been having TIA like symptoms.  Patient will likely need to speak with neurology after workup.  At this time I do not feel he needs to be made a code stroke as his symptoms have resolved.  1:42 PM On reassessment, patient continues to have resolution of his symptoms.  No further speech of normality's or syncopal episodes.  Laboratory testing returned overall  reassuring with improving creatinine, similar hemoglobin prior, and no leukocyte.  Electrolytes overall reassuring.  TSH normal.  Urinalysis showed a UTI.  Magnesium normal.  CT head showed chronic white matter disease but no acute intracranial abnormality.    Neurology was called given the concern for TIA and syncope.    Neurology requested patient be admitted to hospitalist service at [facility] and they will see the patient.  They agree with TIA workup as well as further workup for his recurrent syncopal episodes.  Hospitalist team will be called for admission.    Final Clinical Impressions(s) / ED Diagnoses   Final diagnoses:  Syncope and collapse  Transient speech disturbance     Clinical Impression: 1. Syncope and collapse   2. Transient speech disturbance     Disposition: Admit to hospitlist service    Tegeler, Gwenyth Allegra, MD 01/18/17 [phone]

## 2017-01-18 NOTE — Procedures (Signed)
History: 83 year old male being evaluated for syncope  Sedation: None  Technique: This is a 21 channel routine scalp EEG performed at the bedside with bipolar and monopolar montages arranged in accordance to the international 10/20 system of electrode placement. One channel was dedicated to EKG recording.    Background: The background consists of intermixed alpha and beta activities. There is a well defined posterior dominant rhythm of 9 hz that attenuates with eye opening.  He is drowsy for much of the recording, and in this setting he has frontally predominant intermittent rhythmic delta activity (FIRDA).  Sleep is not recorded.  Photic stimulation: Physiologic driving is not performed  EEG Abnormalities: 1) FIRDA  Clinical Interpretation: This borderline abnormal EEG is recorded in the waking and drowsy  state. Though FIRDA can be an encephalopathic pattern, it can also be associated with states of drowsiness. In the setting I feel it could be a suggestion of a mild generalized non-specific cerebral dysfunction(encephalopathy).  There was no seizure or seizure predisposition recorded on this study. Please note that a normal EEG does not preclude the possibility of epilepsy.   Roland Rack, MD [facility] [phone]  If 7pm- 7am, please page neurology on call as listed in [place].

## 2017-01-18 NOTE — ED Notes (Signed)
Stuck patient for blood work, but was unsuccessful.

## 2017-01-19 ENCOUNTER — Observation Stay (HOSPITAL_COMMUNITY): Payer: MEDICARE

## 2017-01-19 DIAGNOSIS — N400 Enlarged prostate without lower urinary tract symptoms: Secondary | ICD-10-CM | POA: Diagnosis present

## 2017-01-19 DIAGNOSIS — R569 Unspecified convulsions: Secondary | ICD-10-CM | POA: Diagnosis not present

## 2017-01-19 DIAGNOSIS — G4733 Obstructive sleep apnea (adult) (pediatric): Secondary | ICD-10-CM | POA: Diagnosis not present

## 2017-01-19 DIAGNOSIS — R9082 White matter disease, unspecified: Secondary | ICD-10-CM | POA: Diagnosis present

## 2017-01-19 DIAGNOSIS — N183 Chronic kidney disease, stage 3 (moderate): Secondary | ICD-10-CM | POA: Diagnosis not present

## 2017-01-19 DIAGNOSIS — G2581 Restless legs syndrome: Secondary | ICD-10-CM | POA: Diagnosis present

## 2017-01-19 DIAGNOSIS — J449 Chronic obstructive pulmonary disease, unspecified: Secondary | ICD-10-CM | POA: Diagnosis present

## 2017-01-19 DIAGNOSIS — E871 Hypo-osmolality and hyponatremia: Secondary | ICD-10-CM | POA: Diagnosis present

## 2017-01-19 DIAGNOSIS — Z79890 Hormone replacement therapy (postmenopausal): Secondary | ICD-10-CM | POA: Diagnosis not present

## 2017-01-19 DIAGNOSIS — Z7951 Long term (current) use of inhaled steroids: Secondary | ICD-10-CM | POA: Diagnosis not present

## 2017-01-19 DIAGNOSIS — F0390 Unspecified dementia without behavioral disturbance: Secondary | ICD-10-CM | POA: Diagnosis present

## 2017-01-19 DIAGNOSIS — Z96652 Presence of left artificial knee joint: Secondary | ICD-10-CM | POA: Diagnosis present

## 2017-01-19 DIAGNOSIS — Z888 Allergy status to other drugs, medicaments and biological substances status: Secondary | ICD-10-CM | POA: Diagnosis not present

## 2017-01-19 DIAGNOSIS — Z461 Encounter for fitting and adjustment of hearing aid: Secondary | ICD-10-CM | POA: Diagnosis not present

## 2017-01-19 DIAGNOSIS — R479 Unspecified speech disturbances: Secondary | ICD-10-CM | POA: Diagnosis not present

## 2017-01-19 DIAGNOSIS — R55 Syncope and collapse: Secondary | ICD-10-CM | POA: Diagnosis present

## 2017-01-19 DIAGNOSIS — I129 Hypertensive chronic kidney disease with stage 1 through stage 4 chronic kidney disease, or unspecified chronic kidney disease: Secondary | ICD-10-CM | POA: Diagnosis present

## 2017-01-19 DIAGNOSIS — K219 Gastro-esophageal reflux disease without esophagitis: Secondary | ICD-10-CM | POA: Diagnosis present

## 2017-01-19 DIAGNOSIS — E785 Hyperlipidemia, unspecified: Secondary | ICD-10-CM | POA: Diagnosis present

## 2017-01-19 DIAGNOSIS — Z7982 Long term (current) use of aspirin: Secondary | ICD-10-CM | POA: Diagnosis not present

## 2017-01-19 DIAGNOSIS — E7800 Pure hypercholesterolemia, unspecified: Secondary | ICD-10-CM | POA: Diagnosis not present

## 2017-01-19 DIAGNOSIS — E039 Hypothyroidism, unspecified: Secondary | ICD-10-CM | POA: Diagnosis present

## 2017-01-19 DIAGNOSIS — Z87891 Personal history of nicotine dependence: Secondary | ICD-10-CM | POA: Diagnosis not present

## 2017-01-19 DIAGNOSIS — N401 Enlarged prostate with lower urinary tract symptoms: Secondary | ICD-10-CM | POA: Diagnosis not present

## 2017-01-19 DIAGNOSIS — I700 Atherosclerosis of aorta: Secondary | ICD-10-CM | POA: Diagnosis present

## 2017-01-19 LAB — VITAMIN B12: VITAMIN B 12: 2157 pg/mL — AB (ref 180–914)

## 2017-01-19 LAB — AMMONIA: Ammonia: 23 umol/L (ref 9–35)

## 2017-01-19 MED ORDER — LEVETIRACETAM 500 MG PO TABS
500.0000 mg | ORAL_TABLET | Freq: Two times a day (BID) | ORAL | Status: DC
  Administered 2017-01-19 – 2017-01-20 (×2): 500 mg via ORAL
  Filled 2017-01-19 (×2): qty 1

## 2017-01-19 MED ORDER — FLUTICASONE FUROATE-VILANTEROL 100-25 MCG/INH IN AEPB
1.0000 | INHALATION_SPRAY | Freq: Every day | RESPIRATORY_TRACT | Status: DC
  Administered 2017-01-20: 08:00:00 1 via RESPIRATORY_TRACT
  Filled 2017-01-19: qty 28

## 2017-01-19 MED ORDER — MEGESTROL ACETATE 20 MG PO TABS
20.0000 mg | ORAL_TABLET | Freq: Every day | ORAL | Status: DC
  Administered 2017-01-19: 20 mg via ORAL
  Filled 2017-01-19 (×2): qty 1

## 2017-01-19 MED ORDER — SODIUM CHLORIDE 0.9 % IV SOLN
INTRAVENOUS | Status: DC
  Administered 2017-01-19 (×2): via INTRAVENOUS

## 2017-01-19 MED ORDER — SODIUM CHLORIDE 0.9 % IV BOLUS (SEPSIS)
500.0000 mL | Freq: Once | INTRAVENOUS | Status: AC
  Administered 2017-01-19: 500 mL via INTRAVENOUS

## 2017-01-19 MED ORDER — MEGESTROL ACETATE 40 MG PO TABS
40.0000 mg | ORAL_TABLET | Freq: Every day | ORAL | Status: DC
  Administered 2017-01-19 – 2017-01-20 (×2): 40 mg via ORAL
  Filled 2017-01-19 (×2): qty 1

## 2017-01-19 NOTE — Progress Notes (Signed)
Pt has some skin break down from his own home machine and doesn't want to wear our machine at all. Pt wants to wait for his wife to bring his own machine from home.

## 2017-01-19 NOTE — Progress Notes (Signed)
Neurology Progress Note   S:// Seen and examined No acute changes overnight No episodes of aphasia/confusion noted at the time of the encounter but the primary hospitalist called me during the day saying that he had an episode where he was speaking with the hospitalist, all of a sudden went into a blank stare and could not get his words out.  He had some right-sided weakness with it more than left-sided weakness.  He then came out of this episode relatively quick.  O:// Current vital signs: BP (!) 159/78 (BP Location: Right Arm)   Pulse 87   Temp 97.6 F (36.4 C) (Oral)   Resp 18   SpO2 97%  Vital signs in last 24 hours: Temp:  [97.5 F (36.4 C)-98.9 F (37.2 C)] 97.6 F (36.4 C) (11/28 0630) Pulse Rate:  [77-88] 87 (11/27 1840) Resp:  [11-20] 18 (11/28 0630) BP: (144-186)/(73-105) 159/78 (11/28 0630) SpO2:  [96 %-99 %] 97 % (11/28 0630) GENERAL: Awake, alert in NAD HEENT: - Normocephalic and atraumatic, dry mm, no LN++, no Thyromegally LUNGS - Clear to auscultation bilaterally with no wheezes CV - S1S2 RRR, no m/r/g, equal pulses bilaterally. ABDOMEN - Soft, nontender, nondistended with normoactive BS Ext: warm, well perfused, intact peripheral pulses, no edema NEURO:  Mental Status: Continues to be awake, alert, oriented to person place and month but not to the year. Language: speech is not dysarthric.  Naming, repetition, fluency, and comprehension intact.  No evidence of aphasia on the encounter this morning, was able to name, repeat all the sentences from the NIH stroke book. Cranial Nerves: PERRLk. EOMI, visual fields full, no facial asymmetry, facial sensation intact, hearing intact, tongue/uvula/soft palate midline, normal sternocleidomastoid and trapezius muscle strength. No evidence of tongue atrophy or fibrillations Motor: Symmetric 5/5 in all 4 extremities. Tone: is normal and bulk is normal Sensation- Intact to light touch bilaterally Coordination: FTN intact  bilaterally, no ataxia in BLE. Gait- deferred  NIHSS0  Medications  Current Facility-Administered Medications:  .   stroke: mapping our early stages of recovery book, , Does not apply, Once, Rosita Fire, MD .  0.9 %  sodium chloride infusion, , Intravenous, Continuous, Rosita Fire, MD, Last Rate: 75 mL/hr at 01/18/17 2152 .  acetaminophen (TYLENOL) tablet 650 mg, 650 mg, Oral, Q4H PRN **OR** acetaminophen (TYLENOL) solution 650 mg, 650 mg, Per Tube, Q4H PRN **OR** acetaminophen (TYLENOL) suppository 650 mg, 650 mg, Rectal, Q4H PRN, Rosita Fire, MD .  albuterol (PROVENTIL) (2.5 MG/3ML) 0.083% nebulizer solution 2.5 mg, 2.5 mg, Inhalation, Q4H PRN, Rosita Fire, MD .  aspirin chewable tablet 81 mg, 81 mg, Oral, Daily, Rosita Fire, MD, 81 mg at 01/19/17 0817 .  enoxaparin (LOVENOX) injection 40 mg, 40 mg, Subcutaneous, Q24H, Bhandari, Osie Bond, MD .  fluticasone [facility]) 50 MCG/ACT nasal spray 1 spray, 1 spray, Each Nare, Daily PRN, Rosita Fire, MD .  fluticasone furoate-vilanterol (BREO ELLIPTA) 100-25 MCG/INH 1 puff, 1 puff, Inhalation, QHS, Rosita Fire, MD .  gabapentin (NEURONTIN) capsule 100 mg, 100 mg, Oral, QHS, Rosita Fire, MD, 100 mg at 01/18/17 2150 .  iron polysaccharides (NIFEREX) capsule 150 mg, 150 mg, Oral, Daily, Rosita Fire, MD, 150 mg at 01/19/17 0817 .  levothyroxine (SYNTHROID, LEVOTHROID) tablet 112 mcg, 112 mcg, Oral, QAC breakfast, Rosita Fire, MD, 112 mcg at 01/19/17 0816 .  loratadine (CLARITIN) tablet 10 mg, 10 mg, Oral, Daily PRN, Rosita Fire, MD .  megestrol (MEGACE) tablet  40 mg, 40 mg, Oral, QAC breakfast, 40 mg at 01/19/17 0817 **AND** megestrol (MEGACE) tablet 20 mg, 20 mg, Oral, QAC supper, Rosita Fire, MD .  mirabegron ER [facility]) tablet 50 mg, 50 mg, Oral, Daily, Rosita Fire, MD, 50 mg at 01/19/17 0816 .  montelukast (SINGULAIR)  tablet 10 mg, 10 mg, Oral, QHS, Bhandari, Osie Bond, MD .  pantoprazole (PROTONIX) EC tablet 40 mg, 40 mg, Oral, Daily, Rosita Fire, MD, 40 mg at 01/19/17 0816 .  polyethylene glycol (MIRALAX / GLYCOLAX) packet 17 g, 17 g, Oral, Daily PRN, Rosita Fire, MD .  polyvinyl alcohol (LIQUIFILM TEARS) 1.4 % ophthalmic solution 1 drop, 1 drop, Both Eyes, PRN, Leroy Libman, [facility] Labs CBC    Component Value Date/Time   WBC 7.7 01/18/2017 1138   RBC 3.51 (L) 01/18/2017 1138   HGB 10.5 (L) 01/18/2017 1150   HCT 31.0 (L) 01/18/2017 1150   PLT 255 01/18/2017 1138   MCV 90.3 01/18/2017 1138   MCH 31.1 01/18/2017 1138   MCHC 34.4 01/18/2017 1138   RDW 14.2 01/18/2017 1138   LYMPHSABS 2.1 01/18/2017 1138   MONOABS 0.9 01/18/2017 1138   EOSABS 0.6 01/18/2017 1138   BASOSABS 0.1 01/18/2017 1138    CMP     Component Value Date/Time   NA 133 (L) 01/18/2017 1150   K 4.3 01/18/2017 1150   CL 101 01/18/2017 1150   CO2 22 01/18/2017 1138   GLUCOSE 89 01/18/2017 1150   BUN 20 01/18/2017 1150   CREATININE 1.20 01/18/2017 1150   CALCIUM 9.5 01/18/2017 1138   PROT 6.9 01/18/2017 1138   ALBUMIN 3.8 01/18/2017 1138   AST 18 01/18/2017 1138   ALT 13 (L) 01/18/2017 1138   ALKPHOS 68 01/18/2017 1138   BILITOT 0.4 01/18/2017 1138   GFRNONAA 49 (L) 01/18/2017 1138   GFRAA 57 (L) 01/18/2017 1138   Imaging I have reviewed images in epic and the results pertinent to this consultation are: CT-scan of the brain  -unremarkable MRI examination of the brain -pending removal of the removable hearing aid in the right ear.  Other labs Normal ammonia, TSH and B12.  EEG not suggestive of seizures.  Assessment:  83 year old man presenting with recurrent episodes of syncope as well as confusion, which according to his wife who I met at the bedside said has been going on for at least 3-4 months with gradual decline in his memory, presented to the emergency room for evaluation of similar  symptoms. In the emergency room evaluation there was concern for expressive aphasia and patient was admitted for TIA/confusion workup. On my exam, no expressive aphasia noted, but on my colleagues earlier examination overnight he had some excessive aphasia. A witnessed episode was again reported by the hospitalist this morning, with expressive aphasia and right more than left weakness that resolved with some time, raising suspicion for seizure activity in the setting of cognitive decline/dementia. An EEG was done though which was unremarkable for any acute process. We are awaiting an MRI to be done.  Impression: Progressive memory loss Episodes of confusion-could represent underlying seizure activity Evaluate for syncope Evaluate for stroke  Recommendations: As soon as the removable hearing aid can be removed, MRI of the brain should be obtained to rule out an acute stroke. I would recommend starting him on Keppra 500 mg twice daily. Maintain seizure precautions. Per [employer], no driving for 6 months -but the patient does not drive anyway. Outpatient workup for  memory loss. We will follow with you after the MRI becomes available. Plan communicated to the primary hospitalist via phone.  -- Amie Portland, MD [facility] [phone] If 7pm to 7am, please call on call as listed on AMION.

## 2017-01-19 NOTE — Consult Note (Signed)
   [facility] Inpatient Consult   01/19/2017  Randy Sullivan April 29, 1928 [id number]   Patient screened for potential [facility] Care Management services due to frequent hospitalizations.   Spoke with inpatient RNCM prior to bedside engagement.   Chart reviewed and noted that Randy Sullivan just had an episode with nursing at bedside.   Will continue to follow and engage when appropriate.    Marthenia Rolling, MSN-Ed, RN,BSN [facility] Liaison [phone]

## 2017-01-19 NOTE — Evaluation (Signed)
Occupational Therapy Evaluation and Discharge Patient Details Name: Randy Sullivan MRN: [id number] DOB: March 30, 1928 Today's Date: 01/19/2017    History of Present Illness 83 y.o. male who presents with syncopal episode followed by mild expressive aphasia and has had confusion for the past couple of days. Pt has had 2 previous syncopal episodes recently. head CT negative. PMH significant of HTN, hypothyroidism, HLD, BPH, L TKA 11/08/16, OSA on CPAP, famillial tremor, and OA.   Clinical Impression   PTA Pt min A for LB ADL, supervision for bathing, and independent in other ADL. Pt using furniture in house for mobility and RW for community mobility. Pt is currently at baseline for ADL. Discussed/Educated on compensatory strategies for LB ADL as Pt had TKA in Sept. Also reinforced shower safety and stated that with episodes, he should sit as BP can be affected by warmth of bathroom, drink a glass of cold water before mobilizing after showering. OT to sign off as Pt is at baseline for ADL and plans to continue with PT from an acute and OPPT. Thank you for the opportunity to serve this delightful man.    Follow Up Recommendations  No OT follow up;Supervision/Assistance - 24 hour    Equipment Recommendations  None recommended by OT(Pt has appropriate DME)    Recommendations for Other Services       Precautions / Restrictions Precautions Precautions: Fall Restrictions Weight Bearing Restrictions: No      Mobility Bed Mobility Overal bed mobility: Needs Assistance Bed Mobility: Supine to Sit;Sit to Supine     Supine to sit: Min guard;HOB elevated Sit to supine: Min guard;HOB elevated   General bed mobility comments: use of bed rails, min guard for safety - Pt able to perform  Transfers Overall transfer level: Needs assistance   Transfers: Sit to/from Stand Sit to Stand: Min guard         General transfer comment: sit to stand only this session    Balance Overall balance  assessment: Needs assistance Sitting-balance support: Bilateral upper extremity supported;Feet supported Sitting balance-[name]: Fair Sitting balance - Comments: able to sit EOB to demonstrate LB dressing with UE support   Standing balance support: Single extremity supported;During functional activity Standing balance-[name]: Poor Standing balance comment: Pt utilizes single UE to maintain standing balance using IV pole.                           ADL either performed or assessed with clinical judgement   ADL Overall ADL's : At baseline                                       General ADL Comments: Pt with recent TKA in Sept - educated on shower safety especially with episodes     Vision Patient Visual Report: No change from baseline       Perception     Praxis      Pertinent Vitals/Pain Pain Assessment: No/denies pain     Hand Dominance Right   Extremity/Trunk Assessment Upper Extremity Assessment Upper Extremity Assessment: Overall WFL for tasks assessed(tremor noted in RUE during eating)   Lower Extremity Assessment Lower Extremity Assessment: Defer to PT evaluation   Cervical / Trunk Assessment Cervical / Trunk Assessment: Kyphotic   Communication Communication Communication: HOH   Cognition Arousal/Alertness: Awake/alert Behavior During Therapy: WFL for tasks assessed/performed Overall Cognitive Status:  History of cognitive impairments - at baseline                                     General Comments  Wife present for session, Pt returned to bed due to syncopal episode earlier and dinner tray entering room    Exercises     Shoulder Instructions      Home Living Family/patient expects to be discharged to:: Private residence Living Arrangements: Spouse/significant other Available Help at Discharge: Family Type of Home: House Home Access: Stairs to enter [employer] of Steps: 3 Entrance  Stairs-Rails: Right;Left Home Layout: One level;Laundry or work area in basement;Able to live on main level with bedroom/bathroom     Bathroom Shower/Tub: Teacher, early years/pre: Standard Bathroom Accessibility: Yes How Accessible: Accessible via walker Home Equipment: Gilford Rile - 2 wheels;Shower seat;Grab bars - tub/shower      Lives With: Spouse    Prior Functioning/Environment Level of Independence: Independent with assistive device(s)        Comments: Pt is furniture-walker in home and has recently been using 2-wheeled walker to get around outside of home environment s/p left TKA in September 2018; is improving in his ability to perform ADL - supervision for showering, min A for LB dressing (wife helps sometimes)        OT Problem List:        OT Treatment/Interventions:      OT Goals(Current goals can be found in the care plan section) Acute Rehab OT Goals Patient Stated Goal: get home and keep from falling OT Goal Formulation: With patient/family Time For Goal Achievement: 02/02/17 Potential to Achieve Goals: Good  OT Frequency:     Barriers to D/C:            Co-evaluation              AM-PAC PT "6 Clicks" Daily Activity     Outcome Measure Help from another person eating meals?: None Help from another person taking care of personal grooming?: A Little Help from another person toileting, which includes using toliet, bedpan, or urinal?: A Little Help from another person bathing (including washing, rinsing, drying)?: A Little Help from another person to put on and taking off regular upper body clothing?: None Help from another person to put on and taking off regular lower body clothing?: A Little 6 Click Score: 20   End of Session Equipment Utilized During Treatment: Gait belt Nurse Communication: Mobility status  Activity Tolerance: Patient tolerated treatment well Patient left: in bed;with call bell/phone within reach;with bed alarm  set;with family/visitor present                   Time: 1730-1748 OT Time Calculation (min): 18 min Charges:  OT General Charges $OT Visit: 1 Visit OT Evaluation $OT Eval Moderate Complexity: 1 Mod G-Codes: OT G-codes **NOT FOR INPATIENT CLASS** Functional Assessment Tool Used: Clinical judgement;AM-PAC 6 Clicks Daily Activity Functional Limitation: Self care Self Care Current Status (B7628): At least 20 percent but less than 40 percent impaired, limited or restricted Self Care Goal Status (B1517): At least 1 percent but less than 20 percent impaired, limited or restricted Self Care Discharge Status [phone]): At least 20 percent but less than 40 percent impaired, limited or restricted   Hulda Humphrey OTR/L [place] 01/19/2017, 6:05 PM

## 2017-01-19 NOTE — Evaluation (Signed)
Speech Language Pathology Evaluation Patient Details Name: Randy Sullivan MRN: [id number] DOB: November 25, 1928 Today's Date: 01/19/2017 Time: 7253-6644 SLP Time Calculation (min) (ACUTE ONLY): 18 min  Problem List:  Patient Active Problem List   Diagnosis Date Noted  . Transient speech disturbance   . Syncope and collapse 01/13/2017  . CKD (chronic kidney disease), stage III ([place]) 01/13/2017  . OA (osteoarthritis) of knee 11/08/2016  . Essential hypertension 02/05/2016  . Thoracic aortic atherosclerosis ([place]) 02/05/2016  . Benign familial tremor 02/05/2016  . COPD (chronic obstructive pulmonary disease) ([place]) 01/21/2016  . OSA (obstructive sleep apnea) 10/23/2015  . Upper airway cough syndrome 10/23/2015  . Exertional dyspnea 10/23/2015  . RLS (restless legs syndrome) 10/23/2015  . Osteoarthritis of knee 06/04/2010  . Dizziness 06/04/2010  . Benign hypertensive heart disease without heart failure 06/04/2010  . Hypercholesterolemia 06/04/2010  . BPH (benign prostatic hyperplasia) 06/04/2010   Past Medical History:  Past Medical History:  Diagnosis Date  . BPH (benign prostatic hyperplasia)   . Cancer ([place])    non melanoma skin cancers removed  . DJD (degenerative joint disease)   . Familial tremor   . GERD (gastroesophageal reflux disease)   . Heart murmur    hx of  . Hyperlipidemia   . Hypertension   . Hypothyroidism   . Mood disorder ([place])   . Osteoarthritis   . Prostatism   . Shortness of breath    mild with excertion  . Sleep apnea    cpap  . Syncope 01/13/2017  . Thyroid disease    Past Surgical History:  Past Surgical History:  Procedure Laterality Date  . APPENDECTOMY    . EYE SURGERY     bil cataracts  . HEMORROIDECTOMY    . INNER EAR SURGERY Right    With impant.   . TONSILLECTOMY    . TOTAL KNEE ARTHROPLASTY Left 11/08/2016   Procedure: LEFT TOTAL KNEE ARTHROPLASTY;  Surgeon: Gaynelle Arabian, MD;  Location: [facility] ORS;  Service: Orthopedics;  Laterality:  Left;  Adductor Block   HPI:  Randy Sullivan a 83 y.o.malewith medical history significant ofhypertension, hypothyroidism, hyperlipidemia, BPH, OSA on CPAP brought in for further evaluation of syncopal episode this morning. Patient's wife reported that after having breakfast patient suddenly passed out lasted for about 5 minutes. Patient denied any prodromal symptoms. No bowel or urinary incontinence norany seizure-like activity as per patient's wife. Patient was recently admitted at [facility] for similar issue when echocardiogram and a carotid Doppler was done which were unremarkable. This is patient's recurrent issue. Denied headache, dizziness, nausea vomiting, chest pain, shortness of breath. No dysuria, urgency, diarrhea or constipation. In the ER, patient was found to have expressive aphasia as per ER physician. CT scan of head with no acute finding. Neurologist was contacted and recommended to admit patient to [facility] for further evaluation of syncope. Pt with new syncopal episode around 1200. Pt appeared to recover afterwards and was eating lunch during SLE evaluation. Wife present.    Assessment / Plan / Recommendation Clinical Impression  Pt demonstrates baseline impairments that are slowly recovering after syncopal episode. [name] present and confirms that she performs all activities for pt d/t baseline deficits. Pt is currently not oriented to time which wife confirms is baseline ability. Pt able to sustain attention and engage in simple conversation. He is slow to follow commands but this appears to be baseline. No skilled ST is indicated as wife confirms pt's abilities at very close to baseline.  If pt has further syncopal episodes and is slower to recover cognitive function, ST can be reconsulted.     SLP Assessment  SLP Recommendation/Assessment: Patient does not need any further Speech Lanaguage Pathology Services    Follow Up Recommendations  None          SLP  Evaluation Cognition  Overall Cognitive Status: History of cognitive impairments - at baseline Arousal/Alertness: Awake/alert Orientation Level: Oriented to person;Oriented to place;Oriented to situation;Disoriented to time Attention: Sustained Sustained Attention: Appears intact Memory: (at baseline)       Comprehension  Auditory Comprehension Overall Auditory Comprehension: Impaired at baseline Yes/No Questions: Within Functional Limits(Simple) Commands: Within Functional Limits(Simple) Conversation: Simple Interfering Components: Hearing EffectiveTechniques: Repetition Environmental consultant Discrimination: Not tested Reading Comprehension Reading Status: Not tested    Expression Expression Primary Mode of Expression: Verbal Verbal Expression Overall Verbal Expression: Appears within functional limits for tasks assessed Initiation: No impairment Automatic Speech: Name;Social Response;Counting;Day of week;Month of year Level of Generative/Spontaneous Verbalization: Conversation Repetition: No impairment Written Expression Dominant Hand: Right Written Expression: Not tested(d/t tremors)   Oral / Motor  Oral Motor/Sensory Function Overall Oral Motor/Sensory Function: Within functional limits Motor Speech Overall Motor Speech: Appears within functional limits for tasks assessed Respiration: Within functional limits Phonation: Normal Resonance: Within functional limits Articulation: Within functional limitis Intelligibility: Intelligible Motor Planning: Witnin functional limits Motor Speech Errors: Not applicable   GO          Functional Assessment Tool Used: skilled clinical observation Functional Limitations: Memory Memory Current Status (V5643): At least 20 percent but less than 40 percent impaired, limited or restricted Memory Goal Status (P2951): At least 20 percent but less than 40 percent impaired, limited or restricted Memory Discharge Status  [phone]): At least 20 percent but less than 40 percent impaired, limited or restricted         Riddik Senna 01/19/2017, 3:09 PM

## 2017-01-19 NOTE — Progress Notes (Signed)
NO FURTHER SYNCOPAL EPISODES REPORTED/WITNESSED.

## 2017-01-19 NOTE — Progress Notes (Signed)
Wife states pt's [employer]rick hearing aide placed internally in pt ear canal was removed today by [place] staff member that came to the hospital and removed it for pt to have MRI done.  [facility] took hearing aide with them and will replace a new one in when pt is able to go to their office again.   Dr Posey Pronto notified of this so pt's MRI can be reordered to have done as neurology has requested.

## 2017-01-19 NOTE — Progress Notes (Signed)
[facility] Progress Note  Patient: Randy Sullivan RDE:[id number]   PCP: Prince Solian, MD DOB: [birth year]   DOA: 01/18/2017   DOS: 01/19/2017   Date of Service: the patient was seen and examined on 01/19/2017  Subjective: Patient was communicative initially did not have any acute complaint.  Within a few minutes become nonverbal, not following commands and was staring.  Blood pressure was 80/60 at that point.  Patient lowered in the bed blood pressure improved.  Mentation improved.  Patient's right hand was floppy, left hand patient was able to lower slowly during the event.  Brief hospital course: Pt. with PMH of HTN, hypothyroidism, HLD, BPH, OSA; admitted on 01/18/2017, presented with complaint of confusion episode, was found to have syncope versus seizure. Currently further plan is further workup.  Assessment and Plan: #Recurrent syncopal episode of unknown etiology: -Patient reported 2 prior episodes of syncope.  He was admitted last week for similar problem when workup was unremarkable.  Concerning for TIA versus seizure versus arrhythmias versus orthostatic hypotension or vasovagal episodes.  Given expressive aphasia during arrival to ER concerning for TIA. -CT scan of head unremarkable for acute finding. -Check orthostatic blood pressure -Echo and carotid Doppler was done recently which were unremarkable -unable to do MRI and MRA of head because of inner ear implant, called patient's audiologist office at [facility], awaiting callback.  Patient's inferior implant can be removed with the device although we do not have any training in that. -Normal LDL and pending A1c level -Unremarkable EEG -Continue aspirin, -PT, OT evaluation and supportive care -Given that the patient has multiple symptoms witnessed by EDP, neurology, myself patient will be started on Keppra 500 mg twice daily and will monitor.  #Hypertension: Monitor blood pressure.  Avoid hypotensive  episode.  # Hypothyroidism: Continue Synthroid.  TSH level acceptable.  Diet: Cardiac diet DVT Prophylaxis: subcutaneous Heparin  Advance goals of care discussion: full code  Family Communication: family was present at bedside, at the time of interview. The pt provided permission to discuss medical plan with the family. Opportunity was given to ask question and all questions were answered satisfactorily.   Disposition:  Discharge to be determined.   Consultants: neurology  Procedures: EEG  Antibiotics: Anti-infectives (From admission, onward)   None       Objective: Physical Exam: Vitals:   01/19/17 0630 01/19/17 1014 01/19/17 1203 01/19/17 1337  BP: (!) 159/78 114/61 133/78 (!) 141/67  Pulse:  97  92  Resp: 18 18  18   Temp: 97.6 F (36.4 C) 98.5 F (36.9 C)  98.5 F (36.9 C)  TempSrc: Oral Oral  Axillary  SpO2: 97% 100%  98%   No intake or output data in the 24 hours ending 01/19/17 1746 There were no vitals filed for this visit. General: Alert, Awake and Oriented to Time, Place and Person. Appear in mild distress, affect appropriate Eyes: PERRL, Conjunctiva normal ENT: Oral Mucosa clear moist. Neck: no JVD, no Abnormal Mass Or lumps Cardiovascular: S1 and S2 Present, no Murmur, Peripheral Pulses Present Respiratory: normal respiratory effort, Bilateral Air entry equal and Decreased, no use of accessory muscle, Clear to Auscultation, no Crackles, no wheezes Abdomen: Bowel Sound present, Soft and no tenderness, no hernia Skin: no redness, no Rash, on induration Extremities: no Pedal edema, no calf tenderness Neurologic: Grossly no focal neuro deficit. Bilaterally Equal motor strength  Data Reviewed: CBC: Recent Labs  Lab 01/13/17 1045 01/18/17 1138 01/18/17 1150  WBC 7.2 7.7  --  NEUTROABS 3.4 3.9  --   HGB 11.2* 10.9* 10.5*  HCT 32.9* 31.7* 31.0*  MCV 91.4 90.3  --   PLT 248 255  --    Basic Metabolic Panel: Recent Labs  Lab 01/13/17 1135  01/14/17 0509 01/18/17 1138 01/18/17 1150  NA 132*  --  132* 133*  K 4.4  --  4.4 4.3  CL 103  --  104 101  CO2 23  --  22  --   GLUCOSE 88  --  91 89  BUN 23*  --  22* 20  CREATININE 1.32*  --  1.27* 1.20  CALCIUM 9.2  --  9.5  --   MG  --  1.9 2.0  --     Liver Function Tests: Recent Labs  Lab 01/13/17 1135 01/18/17 1138  AST 17 18  ALT 12* 13*  ALKPHOS 62 68  BILITOT 0.7 0.4  PROT 6.4* 6.9  ALBUMIN 3.6 3.8   No results for input(s): LIPASE, AMYLASE in the last 168 hours. Recent Labs  Lab 01/18/17 2353  AMMONIA 23   Coagulation Profile: Recent Labs  Lab 01/18/17 1138  INR 1.09   Cardiac Enzymes: Recent Labs  Lab 01/13/17 1135 01/13/17 1651 01/13/17 2207 01/14/17 0509  TROPONINI 0.03* 0.03* 0.03* 0.03*   BNP (last 3 results) No results for input(s): PROBNP in the last 8760 hours. CBG: Recent Labs  Lab 01/13/17 1052 01/18/17 1022  GLUCAP 90 87   Studies: No results found.  Scheduled Meds: .  stroke: mapping our early stages of recovery book   Does not apply Once  . aspirin  81 mg Oral Daily  . enoxaparin (LOVENOX) injection  40 mg Subcutaneous Q24H  . [START ON 01/20/2017] fluticasone furoate-vilanterol  1 puff Inhalation Daily  . gabapentin  100 mg Oral QHS  . iron polysaccharides  150 mg Oral Daily  . levETIRAcetam  500 mg Oral BID  . levothyroxine  112 mcg Oral QAC breakfast  . megestrol  40 mg Oral QAC breakfast   And  . megestrol  20 mg Oral QAC supper  . mirabegron ER  50 mg Oral Daily  . montelukast  10 mg Oral QHS  . pantoprazole  40 mg Oral Daily   Continuous Infusions: . sodium chloride 75 mL/hr at 01/19/17 1157   PRN Meds: acetaminophen **OR** acetaminophen (TYLENOL) oral liquid 160 mg/5 mL **OR** acetaminophen, albuterol, fluticasone, loratadine, polyethylene glycol, polyvinyl alcohol  Time spent: 35 minutes  Author: Berle Mull, MD [facility] Pager: [phone] 01/19/2017 5:46 PM  If 7PM-7AM, please contact  night-coverage at [email or web address], password [facility]

## 2017-01-19 NOTE — Evaluation (Signed)
Physical Therapy Evaluation Patient Details Name: Randy Sullivan MRN: [id number] DOB: Jul 13, 1928 Today's Date: 01/19/2017   History of Present Illness  83 y.o. male who presents with syncopal episode followed by mild expressive aphasia and has had confusion for the past couple of days. Pt has had 2 previous syncopal episodes recently. head CT negative. PMH significant of HTN, hypothyroidism, HLD, BPH, L TKA 11/08/16, OSA on CPAP, famillial tremor, and OA.  Clinical Impression  Pt presents with impaired mobility, decreased strength, and decreased balance secondary to above. [name] present in room. Pt tolerates ambulation well with min guard assist and no reports of SOB or pain. Pt with slightly unsteady gait, with instances of staggering right and needing UE external support of IV pole. PT will follow acutely in order to increase level of independence and safety with mobility while in hospital setting.  Recommending continuing outpatient PT services as pt currently receiving outpatient PT s/p TKA.     Follow Up Recommendations Outpatient PT;Supervision for mobility/OOB    Equipment Recommendations  None recommended by PT    Recommendations for Other Services       Precautions / Restrictions Precautions Precautions: Fall Restrictions Weight Bearing Restrictions: No      Mobility  Bed Mobility Overal bed mobility: Needs Assistance Bed Mobility: Supine to Sit     Supine to sit: Min assist;HOB elevated     General bed mobility comments: Pt requiring VCs, increased time, and min assist for supine to sit EOB.  Transfers Overall transfer level: Needs assistance   Transfers: Sit to/from Stand Sit to Stand: Min guard         General transfer comment: x2 from EOB. pt in chair post-ambulation. pt requiring increased time to stand and reaches for IV pole to steady self once upright.   Ambulation/Gait Ambulation/Gait assistance: Min guard Ambulation Distance (Feet): 150  Feet Assistive device: (single UE support with IV pole) Gait Pattern/deviations: Step-through pattern;Decreased stride length;Staggering right;Wide base of support Gait velocity: decreased Gait velocity interpretation: <1.8 ft/sec, indicative of risk for recurrent falls General Gait Details: Pt ambulates with head downwards looking torwards the floor. VCs to look straight ahead. Pt staggers to right and brushes objects to right side intermittently.   Stairs            Wheelchair Mobility    Modified Rankin (Stroke Patients Only)       Balance Overall balance assessment: Needs assistance Sitting-balance support: Bilateral upper extremity supported;Feet supported Sitting balance-[name]: Poor Sitting balance - Comments: Pt with UEs supported on bed while seated with feet supported EOB.    Standing balance support: Single extremity supported;During functional activity Standing balance-[name]: Poor Standing balance comment: Pt utilizes single UE to maintain standing balance using IV pole.                             Pertinent Vitals/Pain Pain Assessment: No/denies pain    Home Living Family/patient expects to be discharged to:: Private residence Living Arrangements: Spouse/significant other Available Help at Discharge: Family Type of Home: House Home Access: Stairs to enter Entrance Stairs-Rails: Psychiatric nurse of Steps: 3 Home Layout: One level Home Equipment: Environmental consultant - 2 wheels      Prior Function Level of Independence: Independent with assistive device(s)         Comments: Pt is furniture-walker in home and has recently been using 2-wheeled walker to get around outside of home environment s/p  left TKA in September 2018.     Hand Dominance        Extremity/Trunk Assessment   Upper Extremity Assessment Upper Extremity Assessment: Defer to OT evaluation;Generalized weakness    Lower Extremity Assessment Lower  Extremity Assessment: Generalized weakness    Cervical / Trunk Assessment Cervical / Trunk Assessment: Kyphotic  Communication   Communication: HOH  Cognition Arousal/Alertness: Awake/alert Behavior During Therapy: WFL for tasks assessed/performed Overall Cognitive Status: No family/caregiver present to determine baseline cognitive functioning                                 General Comments: Pt slow to respond, requiring repitition of commands before following - might be due to [facility]?      General Comments General comments (skin integrity, edema, etc.): BP supine: 126/49, BP seated EOB 135/65, BP standing 117/59 (HR 112), BP post-ambulation seated in chair: 116/72 (HR 105). HR during ambulation 122 bpm.     Exercises     Assessment/Plan    PT Assessment Patient needs continued PT services  PT Problem List Decreased strength;Decreased mobility;Decreased balance       PT Treatment Interventions DME instruction;Therapeutic activities;Gait training;Therapeutic exercise;Patient/family education;Stair training;Balance training;Functional mobility training;Neuromuscular re-education    PT Goals (Current goals can be found in the Care Plan section)  Acute Rehab PT Goals Patient Stated Goal: avoid falling PT Goal Formulation: With patient/family Time For Goal Achievement: 02/02/17 Potential to Achieve Goals: Good    Frequency Min 3X/week   Barriers to discharge        Co-evaluation               AM-PAC PT "6 Clicks" Daily Activity  Outcome Measure Difficulty turning over in bed (including adjusting bedclothes, sheets and blankets)?: None Difficulty moving from lying on back to sitting on the side of the bed? : Unable Difficulty sitting down on and standing up from a chair with arms (e.g., wheelchair, bedside commode, etc,.)?: A Little Help needed moving to and from a bed to chair (including a wheelchair)?: A Little Help needed walking in hospital room?: A  Little Help needed climbing 3-5 steps with a railing? : A Lot 6 Click Score: 16    End of Session Equipment Utilized During Treatment: Gait belt Activity Tolerance: Patient tolerated treatment well Patient left: in chair;with chair alarm set;with call bell/phone within reach;with family/visitor present Nurse Communication: Mobility status PT Visit Diagnosis: Unsteadiness on feet (R26.81);Other abnormalities of gait and mobility (R26.89);Repeated falls (R29.6);Difficulty in walking, not elsewhere classified (R26.2);Muscle weakness (generalized) (M62.81)    Time: 1610-9604 PT Time Calculation (min) (ACUTE ONLY): 33 min   Charges:   PT Evaluation $PT Eval Low Complexity: 1 Low PT Treatments $Gait Training: 8-22 mins   PT G Codes:   PT G-Codes **NOT FOR INPATIENT CLASS** Functional Assessment Tool Used: Clinical judgement Functional Limitation: Mobility: Walking and moving around Mobility: Walking and Moving Around Current Status (V4098): At least 20 percent but less than 40 percent impaired, limited or restricted Mobility: Walking and Moving Around Goal Status [phone]): At least 1 percent but less than 20 percent impaired, limited or restricted    Judee Clara, SPT  Judee Clara 01/19/2017, 10:51 AM

## 2017-01-19 NOTE — Progress Notes (Signed)
Patient wants to wait until wife comes to visit to answer admit history questions.Patient stated,"my wife can answer those questions better than I can." Will report off to on coming shift to obtain this information when wife comes to visit today.

## 2017-01-19 NOTE — Progress Notes (Signed)
ARRIVED TO DISCOVERED PT TO BE UNABLE TO RESPOND TO QUESTIONS, DIFFICULT TO AROUSE, DR PATEL AT BEDSIDE, HYPOTENSIVE, UP IN CHAIR, DROOLING  WITH NO OBVIOUS  FACIAL ASSYMETRIES. BOWEL BLADDER INCONTINENCE NOTED. NEW IV ESTABLISHED, SALINE BOLUS ADMIN, WITH SUBSEQUENT BP INCREASE. PT RETURN TO AROUSABLE STATE, MINIMALLY COOPERATIVE, UNABLE TO ANSWER QUESTIONS OF PLACE, LOCATION, TIME, RECOGNIZES FAMILY PRESENT.

## 2017-01-20 DIAGNOSIS — E7800 Pure hypercholesterolemia, unspecified: Secondary | ICD-10-CM

## 2017-01-20 DIAGNOSIS — G4733 Obstructive sleep apnea (adult) (pediatric): Secondary | ICD-10-CM

## 2017-01-20 DIAGNOSIS — R569 Unspecified convulsions: Principal | ICD-10-CM

## 2017-01-20 DIAGNOSIS — N183 Chronic kidney disease, stage 3 (moderate): Secondary | ICD-10-CM

## 2017-01-20 DIAGNOSIS — N401 Enlarged prostate with lower urinary tract symptoms: Secondary | ICD-10-CM

## 2017-01-20 LAB — COMPREHENSIVE METABOLIC PANEL
ALBUMIN: 3.3 g/dL — AB (ref 3.5–5.0)
ALK PHOS: 55 U/L (ref 38–126)
ALT: 10 U/L — ABNORMAL LOW (ref 17–63)
ANION GAP: 5 (ref 5–15)
AST: 16 U/L (ref 15–41)
BILIRUBIN TOTAL: 0.7 mg/dL (ref 0.3–1.2)
BUN: 18 mg/dL (ref 6–20)
CALCIUM: 9 mg/dL (ref 8.9–10.3)
CO2: 22 mmol/L (ref 22–32)
CREATININE: 1.27 mg/dL — AB (ref 0.61–1.24)
Chloride: 106 mmol/L (ref 101–111)
GFR calc non Af Amer: 49 mL/min — ABNORMAL LOW (ref >60)
GFR, EST AFRICAN AMERICAN: 57 mL/min — AB (ref >60)
GLUCOSE: 95 mg/dL (ref 65–99)
Potassium: 3.8 mmol/L (ref 3.5–5.1)
SODIUM: 133 mmol/L — AB (ref 135–145)
Total Protein: 6 g/dL — ABNORMAL LOW (ref 6.5–8.1)

## 2017-01-20 LAB — CBC WITH DIFFERENTIAL/PLATELET
Basophils Absolute: 0.1 10*3/uL (ref 0.0–0.1)
Basophils Relative: 1 %
EOS ABS: 0.5 10*3/uL (ref 0.0–0.7)
Eosinophils Relative: 7 %
HCT: 29.3 % — ABNORMAL LOW (ref 39.0–52.0)
HEMOGLOBIN: 9.9 g/dL — AB (ref 13.0–17.0)
LYMPHS ABS: 2.6 10*3/uL (ref 0.7–4.0)
Lymphocytes Relative: 37 %
MCH: 30.2 pg (ref 26.0–34.0)
MCHC: 33.8 g/dL (ref 30.0–36.0)
MCV: 89.3 fL (ref 78.0–100.0)
MONOS PCT: 13 %
Monocytes Absolute: 0.9 10*3/uL (ref 0.1–1.0)
NEUTROS ABS: 3 10*3/uL (ref 1.7–7.7)
NEUTROS PCT: 42 %
Platelets: 236 10*3/uL (ref 150–400)
RBC: 3.28 MIL/uL — AB (ref 4.22–5.81)
RDW: 14.2 % (ref 11.5–15.5)
WBC: 7.1 10*3/uL (ref 4.0–10.5)

## 2017-01-20 LAB — MAGNESIUM: Magnesium: 1.9 mg/dL (ref 1.7–2.4)

## 2017-01-20 MED ORDER — LEVETIRACETAM 500 MG PO TABS: 500.0000 mg | ORAL_TABLET | Freq: Two times a day (BID) | ORAL | 0 refills | Status: DC

## 2017-01-20 NOTE — Progress Notes (Signed)
Discharge orders received.  Discharge instructions and follow-up appointments reviewed with the patient and his wife.  VSS upon discharge.  IV removed and education complete.  All belongings sent with the patient.  Transported out via wheelchair.   Baine Decesare M, RN 

## 2017-01-20 NOTE — Progress Notes (Signed)
Physical Therapy Treatment Patient Details Name: Randy Sullivan MRN: [id number] DOB: 1928-11-30 Today's Date: 01/20/2017    History of Present Illness 83 y.o. male who presents with syncopal episode followed by mild expressive aphasia and has had confusion for the past couple of days. Pt has had 2 previous syncopal episodes recently. head CT negative. PMH significant of HTN, hypothyroidism, HLD, BPH, L TKA 11/08/16, OSA on CPAP, famillial tremor, and OA.    PT Comments    Pt activity tolerance improving; gait stability improved with incr distance; feel pt would benefit from either f/u OPPT or HHPT depending on transportation; has a "walking stick" that he uses at times, but "not  Very often", may benefit from cane if pt would be agreeable  Follow Up Recommendations  Home health PT;Outpatient PT;Supervision for mobility/OOB(vs OPPT)     Equipment Recommendations  None recommended by PT(has  a "walking stick")    Recommendations for Other Services       Precautions / Restrictions Precautions Precautions: Fall Restrictions Weight Bearing Restrictions: No    Mobility  Bed Mobility Overal bed mobility: Needs Assistance Bed Mobility: Supine to Sit     Supine to sit: Min guard     General bed mobility comments: incr time, pt required decr assist with cues for use of UEs  Transfers Overall transfer level: Needs assistance Equipment used: None Transfers: Sit to/from Stand Sit to Stand: Min guard         General transfer comment: cues for anterior wt shift, incr time, stands with wide BOS  Ambulation/Gait Ambulation/Gait assistance: Min guard;Min assist Ambulation Distance (Feet): 200 Feet Assistive device: None;1 person hand held assist(IV pole or HHA to no AD) Gait Pattern/deviations: Step-through pattern;Decreased stride length;Trunk flexed;Wide base of support     General Gait Details: cues for upward gaze and step length; stability improves with distance, mild  drifting initially but no overt LOB   Stairs            Wheelchair Mobility    Modified Rankin (Stroke Patients Only)       Balance     Sitting balance-[name]: Fair Sitting balance - Comments: fair to good   Standing balance support: Single extremity supported;During functional activity Standing balance-[name]: Poor Standing balance comment: utilizes light HHA or IV pole to maintain balance during amb;                             Cognition Arousal/Alertness: Awake/alert Behavior During Therapy: WFL for tasks assessed/performed Overall Cognitive Status: History of cognitive impairments - at baseline                                 General Comments: some repetition needed but seems to be more d/t HOH rather than cognitive related      Exercises      General Comments        Pertinent Vitals/Pain Pain Assessment: No/denies pain    Home Living                      Prior Function            PT Goals (current goals can now be found in the care plan section) Acute Rehab PT Goals Patient Stated Goal: get home and keep from falling PT Goal Formulation: With patient/family Time For Goal Achievement: 02/02/17 Potential to  Achieve Goals: Good Progress towards PT goals: Progressing toward goals    Frequency    Min 3X/week      PT Plan Current plan remains appropriate    Co-evaluation              AM-PAC PT "6 Clicks" Daily Activity  Outcome Measure  Difficulty turning over in bed (including adjusting bedclothes, sheets and blankets)?: None Difficulty moving from lying on back to sitting on the side of the bed? : Unable Difficulty sitting down on and standing up from a chair with arms (e.g., wheelchair, bedside commode, etc,.)?: A Little Help needed moving to and from a bed to chair (including a wheelchair)?: A Little Help needed walking in hospital room?: A Little Help needed climbing 3-5 steps with a  railing? : A Little 6 Click Score: 17    End of Session Equipment Utilized During Treatment: Gait belt Activity Tolerance: Patient tolerated treatment well Patient left: in chair;with chair alarm set;with call bell/phone within reach;with family/visitor present   PT Visit Diagnosis: Unsteadiness on feet (R26.81);Other abnormalities of gait and mobility (R26.89);Repeated falls (R29.6);Difficulty in walking, not elsewhere classified (R26.2);Muscle weakness (generalized) (M62.81)     Time: 0223-3612 PT Time Calculation (min) (ACUTE ONLY): 20 min  Charges:  $Gait Training: 8-22 mins                    G Codes:          Tariana Moldovan 01/27/17, 3:33 PM

## 2017-01-20 NOTE — Progress Notes (Signed)
Neurology Progress Note   S:// Patient seen and examined.  No acute changes overnight.  No more episodes of aphasia/confusion after the one noted in the ER and not the one noted by the primary hospitalist yesterday. Started on Keppra 500 twice daily.  Wife is at bedside.   O:// Current vital signs: BP (!) 130/49 (BP Location: Right Arm)   Pulse 94   Temp 97.9 F (36.6 C) (Oral)   Resp 18   SpO2 97%  Vital signs in last 24 hours: Temp:  [97.9 F (36.6 C)-98.5 F (36.9 C)] 97.9 F (36.6 C) (11/29 0930) Pulse Rate:  [92-96] 94 (11/29 0930) Resp:  [18] 18 (11/29 0930) BP: (130-160)/(49-91) 130/49 (11/29 0930) SpO2:  [97 %-100 %] 97 % (11/29 0827) Limited exam.  Patient was sleeping and did not wish to be discharged. Easily arousable.  In no acute distress. Speech is clear.  Extremely hard of hearing and with the hearing aid out, more so. No focal motor deficits.  No focal sensory deficits. No focal cranial nerve deficits with the exception of limited hearing.   Medications  Current Facility-Administered Medications:  .   stroke: mapping our early stages of recovery book, , Does not apply, Once, Rosita Fire, MD .  0.9 %  sodium chloride infusion, , Intravenous, Continuous, Lavina Hamman, MD, Last Rate: 75 mL/hr at 01/19/17 2216 .  acetaminophen (TYLENOL) tablet 650 mg, 650 mg, Oral, Q4H PRN **OR** acetaminophen (TYLENOL) solution 650 mg, 650 mg, Per Tube, Q4H PRN **OR** acetaminophen (TYLENOL) suppository 650 mg, 650 mg, Rectal, Q4H PRN, Rosita Fire, MD .  albuterol (PROVENTIL) (2.5 MG/3ML) 0.083% nebulizer solution 2.5 mg, 2.5 mg, Inhalation, Q4H PRN, Rosita Fire, MD .  aspirin chewable tablet 81 mg, 81 mg, Oral, Daily, Rosita Fire, MD, 81 mg at 01/20/17 0856 .  enoxaparin (LOVENOX) injection 40 mg, 40 mg, Subcutaneous, Q24H, Rosita Fire, MD, 40 mg at 01/19/17 1357 .  fluticasone (FLONASE) 50 MCG/ACT nasal spray 1 spray, 1  spray, Each Nare, Daily PRN, Rosita Fire, MD .  fluticasone furoate-vilanterol (BREO ELLIPTA) 100-25 MCG/INH 1 puff, 1 puff, Inhalation, Daily, Lavina Hamman, MD, 1 puff at 01/20/17 0824 .  gabapentin (NEURONTIN) capsule 100 mg, 100 mg, Oral, QHS, Rosita Fire, MD, 100 mg at 01/19/17 2211 .  iron polysaccharides (NIFEREX) capsule 150 mg, 150 mg, Oral, Daily, Rosita Fire, MD, 150 mg at 01/20/17 0856 .  levETIRAcetam (KEPPRA) tablet 500 mg, 500 mg, Oral, BID, Amie Portland, MD, 500 mg at 01/20/17 0856 .  levothyroxine (SYNTHROID, LEVOTHROID) tablet 112 mcg, 112 mcg, Oral, QAC breakfast, Rosita Fire, MD, 112 mcg at 01/20/17 0856 .  loratadine (CLARITIN) tablet 10 mg, 10 mg, Oral, Daily PRN, Rosita Fire, MD .  megestrol (MEGACE) tablet 40 mg, 40 mg, Oral, QAC breakfast, 40 mg at 01/20/17 0856 **AND** megestrol (MEGACE) tablet 20 mg, 20 mg, Oral, QAC supper, Rosita Fire, MD, 20 mg at 01/19/17 1746 .  mirabegron ER (MYRBETRIQ) tablet 50 mg, 50 mg, Oral, Daily, Rosita Fire, MD, 50 mg at 01/20/17 0856 .  montelukast (SINGULAIR) tablet 10 mg, 10 mg, Oral, QHS, Rosita Fire, MD, 10 mg at 01/19/17 2211 .  pantoprazole (PROTONIX) EC tablet 40 mg, 40 mg, Oral, Daily, Rosita Fire, MD, 40 mg at 01/20/17 0856 .  polyethylene glycol (MIRALAX / GLYCOLAX) packet 17 g, 17 g, Oral, Daily PRN, Rosita Fire, MD .  polyvinyl alcohol (  LIQUIFILM TEARS) 1.4 % ophthalmic solution 1 drop, 1 drop, Both Eyes, PRN, Leroy Libman, [facility] Labs CBC    Component Value Date/Time   WBC 7.1 01/20/2017 0337   RBC 3.28 (L) 01/20/2017 0337   HGB 9.9 (L) 01/20/2017 0337   HCT 29.3 (L) 01/20/2017 0337   PLT 236 01/20/2017 0337   MCV 89.3 01/20/2017 0337   MCH 30.2 01/20/2017 0337   MCHC 33.8 01/20/2017 0337   RDW 14.2 01/20/2017 0337   LYMPHSABS 2.6 01/20/2017 0337   MONOABS 0.9 01/20/2017 0337   EOSABS 0.5 01/20/2017 0337   BASOSABS  0.1 01/20/2017 0337    CMP     Component Value Date/Time   NA 133 (L) 01/20/2017 0337   K 3.8 01/20/2017 0337   CL 106 01/20/2017 0337   CO2 22 01/20/2017 0337   GLUCOSE 95 01/20/2017 0337   BUN 18 01/20/2017 0337   CREATININE 1.27 (H) 01/20/2017 0337   CALCIUM 9.0 01/20/2017 0337   PROT 6.0 (L) 01/20/2017 0337   ALBUMIN 3.3 (L) 01/20/2017 0337   AST 16 01/20/2017 0337   ALT 10 (L) 01/20/2017 0337   ALKPHOS 55 01/20/2017 0337   BILITOT 0.7 01/20/2017 0337   GFRNONAA 49 (L) 01/20/2017 0337   GFRAA 57 (L) 01/20/2017 0337  Imaging I have reviewed images in epic and the results pertinent to this consultation are:  MRI examination of the brain -does not show any acute stroke.  EEG showed slowing.  Assessment:  83 year old man with progressive memory loss, presenting for intermittent episodes of staring and aphasia. MRI negative for stroke. Given the history of progressive memory loss and dementia and these episodes, I believe that these possibly represent seizures.   Impression: Multiple episodes of staring spell and aphasia/speech arrest -concerning for seizure Progressive memory decline-?  Dementia  Recommendations:  Started Keppra 500 twice daily yesterday.  Continue with Keppra 500 twice daily as outpatient. Outpatient neurology follow-up for formal neurocognitive/neuropsych testing. Maintain seizure precautions. No driving per [facility] for 6 months. Plan communicated to the primary hospitalist.  Inpatient neurology will be available as needed.  Please call with questions.   Amie Portland, MD [facility] [phone] If 7pm to 7am, please call on call as listed on AMION.

## 2017-01-20 NOTE — Discharge Instructions (Signed)
Please schedule neurocognitive testing and f/u for siezures in 2-3 wks with a neurologist of your choice.  No driving or operating heavy machinery for the next 6 months.   Please take all your medications with you for your next visit with your Primary MD. Please request your Primary MD to go over all hospital test results at the follow up. Please ask your Primary MD to get all Hospital records sent to his/her office.  If you experience worsening of your admission symptoms, develop shortness of breath, chest pain, suicidal or homicidal thoughts or a life threatening emergency, you must seek medical attention immediately by calling 911 or calling your MD.  Dennis Bast must read the complete instructions/literature along with all the possible adverse reactions/side effects for all the medicines you take including new medications that have been prescribed to you. Take new medicines after you have completely understood and accpet all the possible adverse reactions/side effects.   Do not drive when taking pain medications or sedatives.    Do not take more than prescribed Pain, Sleep and Anxiety Medications  If you have smoked or chewed Tobacco in the last 2 yrs please stop. Stop any regular alcohol and or recreational drug use.  Wear Seat belts while driving.

## 2017-01-20 NOTE — Care Management Note (Signed)
Case Management Note  Patient Details  Name: Randy Sullivan MRN: [id number] Date of Birth: 07/07/1928  Subjective/Objective:      Pt admitted with syncope. He is from home with his spouse.             Action/Plan: CM consulted for outpatient therapy. Pt had just completed outpatient therapy at [facility]. Patient would like to go back to [facility]. CM called them and faxed the information requested [phone]. Wife to provide transportation home.   Expected Discharge Date:  01/20/17               Expected Discharge Plan:  OP Rehab  In-House Referral:     Discharge planning Services  CM Consult  Post Acute Care Choice:    Choice offered to:  Spouse, Patient  DME Arranged:    DME Agency:     HH Arranged:    [place] Agency:     Status of Service:  Completed, signed off  If discussed at [employer] of Stay Meetings, dates discussed:    Additional Comments:  Pollie Friar, RN 01/20/2017, 1:50 PM

## 2017-01-21 DIAGNOSIS — R569 Unspecified convulsions: Secondary | ICD-10-CM

## 2017-01-21 NOTE — Discharge Summary (Signed)
Physician Discharge Summary  Randy Sullivan OQH:[id number] DOB: 1928/08/17 DOA: 01/18/2017  PCP: Prince Solian, MD  Admit date: 01/18/2017 Discharge date: 01/21/2017  Admitted From: home Disposition:  Home    Recommendations for Outpatient Follow-up:  1. F/ u with neurology for seizures and neurocognitive testing  Discharge Condition:  stable   CODE STATUS:  Full code   Consultations:  neurology    Discharge Diagnoses:  Principal Problem:   Seizure ([place]) Active Problems:   Hypercholesterolemia   BPH (benign prostatic hyperplasia)   OSA (obstructive sleep apnea)   CKD (chronic kidney disease), stage III (HCC)    Subjective: No complaints. Wife at bedside has many questions which have been answered  Brief Summary: Pt. with PMH of HTN, hypothyroidism, HLD, BPH, OSA; admitted on 01/18/2017, presented with complaint of confusion episode. Suspected to have syncope versus seizure.    Hospital Course:  Seizures - episodes often involve blank stare or syncope while sitting - per neuro eval, he is likely having seizure and therefore has been started on Keppra and advised to f/u with outpt neurology - EEG results noted below- no seizure predisposition or activity noted  Dementia - needs outpt neurocognitive testing  Discharge Instructions  Discharge Instructions    Ambulatory referral to Physical Therapy   Complete by:  As directed    Diet - low sodium heart healthy   Complete by:  As directed    Increase activity slowly   Complete by:  As directed      Allergies as of 01/20/2017      Reactions   Tizanidine Other (See Comments)   Hypotension       Medication List    TAKE these medications   albuterol 108 (90 Base) MCG/ACT inhaler Commonly known as:  PROVENTIL HFA;VENTOLIN HFA Inhale 2 puffs into the lungs every 4 (four) hours as needed for wheezing or shortness of breath.   aspirin 81 MG chewable tablet Chew 1 tablet (81 mg total) by mouth daily.    fluticasone 50 MCG/ACT nasal spray Commonly known as:  FLONASE Place 1 spray into both nostrils daily as needed for allergies.   fluticasone furoate-vilanterol 100-25 MCG/INH Aepb Commonly known as:  BREO ELLIPTA Inhale 1 puff into the lungs daily. What changed:  when to take this   gabapentin 100 MG capsule Commonly known as:  NEURONTIN Take 100 mg by mouth at bedtime.   iron polysaccharides 150 MG capsule Commonly known as:  NIFEREX Take 150 mg by mouth daily.   levETIRAcetam 500 MG tablet Commonly known as:  KEPPRA Take 1 tablet (500 mg total) by mouth 2 (two) times daily.   levothyroxine 112 MCG tablet Commonly known as:  SYNTHROID, LEVOTHROID Take 112 mcg by mouth daily before breakfast.   loratadine 10 MG tablet Commonly known as:  CLARITIN Take 10 mg by mouth daily as needed for allergies.   megestrol 20 MG tablet Commonly known as:  MEGACE Take 20-40 mg by mouth See admin instructions. Take 2 tablets (40mg ) before breakfast and 1 tablet (20mg ) before supper.   montelukast 10 MG tablet Commonly known as:  SINGULAIR Take 1 tablet (10 mg total) by mouth at bedtime.   MYRBETRIQ 50 MG Tb24 tablet Generic drug:  mirabegron ER Take 50 mg by mouth daily.   omeprazole 20 MG capsule Commonly known as:  PRILOSEC Take 20 mg by mouth 2 (two) times daily before a meal.   polyethylene glycol packet Commonly known as:  MIRALAX / GLYCOLAX Take 17  g by mouth daily as needed for mild constipation.   propranolol 20 MG tablet Commonly known as:  INDERAL Take 20 mg by mouth daily.   rosuvastatin 5 MG tablet Commonly known as:  CRESTOR Take 5 mg by mouth at bedtime.   solifenacin 5 MG tablet Commonly known as:  VESICARE Take 5 mg by mouth every evening.   SYSTANE OP Apply 1-2 drops to eye daily as needed (dry eyes).      Follow-up Information    [facility] Follow up.   Why:  in 2-3 wks Contact information: [place] [address], [place] [phone]       [facility] Follow up.   Specialty:  Neurology Why:  2-3 wks Contact information: [place] [phone]         Allergies  Allergen Reactions  . Tizanidine Other (See Comments)    Hypotension      Procedures/Studies: EEG Abnormalities: 1) FIRDA  Clinical Interpretation: This borderline abnormal EEG is recorded in the waking and drowsy  state. Though FIRDA can be an encephalopathic pattern, it can also be associated with states of drowsiness. In the setting I feel it could be a suggestion of a mild generalized non-specific cerebral dysfunction(encephalopathy).  There was no seizure or seizure predisposition recorded on this study. Please note that a normal EEG does not preclude the possibility of epilepsy.     Dg Skull 1-3 Views  Result Date: 01/19/2017 CLINICAL DATA:  Evaluate for hearing aid. EXAM: SKULL - 1-3 VIEW COMPARISON:  Head CT from earlier today FINDINGS: Right-sided hearing aid has been removed compared to head CT from yesterday. No left-sided hearing aid is seen. No evidence of acute or aggressive finding in the calvarium. Cervical facet arthropathy. IMPRESSION: Right-sided hearing aid has been removed since head CT yesterday. No left-sided hearing aid. Electronically Signed   By: Monte Fantasia M.D.   On: 01/19/2017 19:54   Dg Chest 2 View  Result Date: 01/18/2017 CLINICAL DATA:  Syncope, altered mental status EXAM: CHEST  2 VIEW COMPARISON:  01/13/2017 FINDINGS: Heart is borderline in size. Mild vascular congestion. Low lung volumes. No confluent opacities or effusions. No acute bony abnormality. IMPRESSION: Low lung volumes.  Mild vascular congestion. Electronically Signed   By: Rolm Baptise M.D.   On: 01/18/2017 11:17   Dg Chest 2 View  Result Date: 01/13/2017 CLINICAL DATA:  Syncope EXAM: CHEST  2 VIEW COMPARISON:  11/21/2016 FINDINGS: Heart is  normal in size. Lungs under aerated and grossly clear. No pneumothorax. No pleural effusion. Stable thoracic spine. IMPRESSION: No active cardiopulmonary disease. Electronically Signed   By: Marybelle Killings M.D.   On: 01/13/2017 11:33   Ct Head Wo Contrast  Result Date: 01/18/2017 CLINICAL DATA:  Altered level of consciousness. EXAM: CT HEAD WITHOUT CONTRAST TECHNIQUE: Contiguous axial images were obtained from the base of the skull through the vertex without intravenous contrast. COMPARISON:  CT scan of November 21, 2016. FINDINGS: Brain: Mild chronic ischemic white matter disease is noted. Minimal diffuse cortical atrophy is noted. No mass effect or midline shift is noted. Ventricular size is within normal limits. There is no evidence of mass lesion, hemorrhage or acute infarction. Vascular: No hyperdense vessel or unexpected calcification. Skull: Normal. Negative for fracture or focal lesion. Sinuses/Orbits: No acute finding. Other: None. IMPRESSION: Mild chronic ischemic white matter disease. No acute intracranial abnormality seen. Electronically Signed   By: Jeneen Rinks  Murlean Caller, M.D.   On: 01/18/2017 11:30   Mr Jodene Nam Head Wo Contrast  Result Date: 01/19/2017 CLINICAL DATA:  Speech difficulty. EXAM: MRI HEAD WITHOUT CONTRAST MRA HEAD WITHOUT CONTRAST TECHNIQUE: Multiplanar, multiecho pulse sequences of the brain and surrounding structures were obtained without intravenous contrast. Angiographic images of the head were obtained using MRA technique without contrast. COMPARISON:  Head CT from yesterday FINDINGS: MRI HEAD FINDINGS Brain: Motion degraded and truncated study. T1 weighted and coronal T2 imaging was not acquired. Available images are motion degraded. No acute infarct. No acute hemorrhage, hydrocephalus, or masslike finding. Negative for extra-axial collection. Atrophy with ventriculomegaly. Volume loss in the medial temporal lobes is at least moderate. Mild for age chronic microvascular ischemic type  gliosis in the periventricular white matter. Small remote right cerebellar infarcts. Remote right thalamus lacunar infarct. Asymmetric right dentate nucleus dilated perivascular spaces or mineralization. Vascular: Arterial findings below. Preserved dural venous sinus flow voids. Skull and upper cervical spine: No evidence of marrow lesion. Sinuses/Orbits: No acute finding.  Bilateral cataract resection. MRA HEAD FINDINGS Symmetric carotid arteries. Left dominant vertebral artery. No branch occlusion or flow limiting stenosis. Mild for age atheromatous type narrowing the proximal MCAs and mid basilar. Negative for aneurysm or vascular malformation. IMPRESSION: Brain MRI: 1. Truncated study due to patient request. 2. No acute finding including infarct. 3. Moderate atrophy. 4. Chronic small vessel ischemia including remote lacunar infarct in the right thalamus. Intracranial MRA: Mild atheromatous changes. No acute finding or flow limiting stenosis. Electronically Signed   By: Monte Fantasia M.D.   On: 01/19/2017 20:46   Mr Brain Wo Contrast  Result Date: 01/19/2017 CLINICAL DATA:  Speech difficulty. EXAM: MRI HEAD WITHOUT CONTRAST MRA HEAD WITHOUT CONTRAST TECHNIQUE: Multiplanar, multiecho pulse sequences of the brain and surrounding structures were obtained without intravenous contrast. Angiographic images of the head were obtained using MRA technique without contrast. COMPARISON:  Head CT from yesterday FINDINGS: MRI HEAD FINDINGS Brain: Motion degraded and truncated study. T1 weighted and coronal T2 imaging was not acquired. Available images are motion degraded. No acute infarct. No acute hemorrhage, hydrocephalus, or masslike finding. Negative for extra-axial collection. Atrophy with ventriculomegaly. Volume loss in the medial temporal lobes is at least moderate. Mild for age chronic microvascular ischemic type gliosis in the periventricular white matter. Small remote right cerebellar infarcts. Remote right  thalamus lacunar infarct. Asymmetric right dentate nucleus dilated perivascular spaces or mineralization. Vascular: Arterial findings below. Preserved dural venous sinus flow voids. Skull and upper cervical spine: No evidence of marrow lesion. Sinuses/Orbits: No acute finding.  Bilateral cataract resection. MRA HEAD FINDINGS Symmetric carotid arteries. Left dominant vertebral artery. No branch occlusion or flow limiting stenosis. Mild for age atheromatous type narrowing the proximal MCAs and mid basilar. Negative for aneurysm or vascular malformation. IMPRESSION: Brain MRI: 1. Truncated study due to patient request. 2. No acute finding including infarct. 3. Moderate atrophy. 4. Chronic small vessel ischemia including remote lacunar infarct in the right thalamus. Intracranial MRA: Mild atheromatous changes. No acute finding or flow limiting stenosis. Electronically Signed   By: Monte Fantasia M.D.   On: 01/19/2017 20:46       Discharge Exam: Vitals:   01/20/17 0930 01/20/17 1425  BP: (!) 130/49 (!) 144/64  Pulse: 94 85  Resp: 18 18  Temp: 97.9 F (36.6 C) 98.2 F (36.8 C)  SpO2: 100% 100%   Vitals:   01/20/17 0426 01/20/17 0827 01/20/17 0930 01/20/17 1425  BP: (!) 160/91  (!) 130/49 Marland Kitchen)  144/64  Pulse: 96  94 85  Resp: 18  18 18   Temp: 98.5 F (36.9 C)  97.9 F (36.6 C) 98.2 F (36.8 C)  TempSrc: Oral  Oral Oral  SpO2: 100% 97% 100% 100%    General: Pt is alert, awake, not in acute distress Cardiovascular: RRR, S1/S2 +, no rubs, no gallops Respiratory: CTA bilaterally, no wheezing, no rhonchi Abdominal: Soft, NT, ND, bowel sounds + Extremities: no edema, no cyanosis    The results of significant diagnostics from this hospitalization (including imaging, microbiology, ancillary and laboratory) are listed below for reference.     Microbiology: No results found for this or any previous visit (from the past 240 hour(s)).   Labs: BNP (last 3 results) Recent Labs     01/18/17 1138  BNP 268.3*   Basic Metabolic Panel: Recent Labs  Lab 01/18/17 1138 01/18/17 1150 01/20/17 0337  NA 132* 133* 133*  K 4.4 4.3 3.8  CL 104 101 106  CO2 22  --  22  GLUCOSE 91 89 95  BUN 22* 20 18  CREATININE 1.27* 1.20 1.27*  CALCIUM 9.5  --  9.0  MG 2.0  --  1.9   Liver Function Tests: Recent Labs  Lab 01/18/17 1138 01/20/17 0337  AST 18 16  ALT 13* 10*  ALKPHOS 68 55  BILITOT 0.4 0.7  PROT 6.9 6.0*  ALBUMIN 3.8 3.3*   No results for input(s): LIPASE, AMYLASE in the last 168 hours. Recent Labs  Lab 01/18/17 2353  AMMONIA 23   CBC: Recent Labs  Lab 01/18/17 1138 01/18/17 1150 01/20/17 0337  WBC 7.7  --  7.1  NEUTROABS 3.9  --  3.0  HGB 10.9* 10.5* 9.9*  HCT 31.7* 31.0* 29.3*  MCV 90.3  --  89.3  PLT 255  --  236   Cardiac Enzymes: No results for input(s): CKTOTAL, CKMB, CKMBINDEX, TROPONINI in the last 168 hours. BNP: Invalid input(s): POCBNP CBG: Recent Labs  Lab 01/18/17 1022  GLUCAP 87   D-Dimer No results for input(s): DDIMER in the last 72 hours. Hgb A1c No results for input(s): HGBA1C in the last 72 hours. Lipid Profile No results for input(s): CHOL, HDL, LDLCALC, TRIG, CHOLHDL, LDLDIRECT in the last 72 hours. Thyroid function studies No results for input(s): TSH, T4TOTAL, T3FREE, THYROIDAB in the last 72 hours.  Invalid input(s): FREET3 Anemia work up Recent Labs    01/18/17 2353  VITAMINB12 2,157*   Urinalysis    Component Value Date/Time   COLORURINE STRAW (A) 01/18/2017 1138   APPEARANCEUR CLEAR 01/18/2017 1138   LABSPEC 1.008 01/18/2017 1138   PHURINE 7.0 01/18/2017 1138   GLUCOSEU NEGATIVE 01/18/2017 1138   HGBUR NEGATIVE 01/18/2017 1138   [place] 01/18/2017 1138   [place] 01/18/2017 [place] 01/18/2017 1138   NITRITE NEGATIVE 01/18/2017 1138   LEUKOCYTESUR NEGATIVE 01/18/2017 1138   Sepsis Labs Invalid input(s): PROCALCITONIN,  WBC,   LACTICIDVEN Microbiology No results found for this or any previous visit (from the past 240 hour(s)).   Time coordinating discharge: Over 30 minutes  SIGNED:   Debbe Odea, MD  [facility] 01/21/2017, 3:06 PM Pager   If 7PM-7AM, please contact night-coverage [email or web address] Password [email or web address]

## 2017-02-01 DIAGNOSIS — M1711 Unilateral primary osteoarthritis, right knee: Secondary | ICD-10-CM | POA: Diagnosis not present

## 2017-02-03 DIAGNOSIS — N3941 Urge incontinence: Secondary | ICD-10-CM | POA: Diagnosis not present

## 2017-02-19 DIAGNOSIS — D6489 Other specified anemias: Secondary | ICD-10-CM | POA: Diagnosis not present

## 2017-02-19 DIAGNOSIS — R55 Syncope and collapse: Secondary | ICD-10-CM | POA: Diagnosis not present

## 2017-02-19 DIAGNOSIS — R2681 Unsteadiness on feet: Secondary | ICD-10-CM | POA: Diagnosis not present

## 2017-02-19 DIAGNOSIS — N400 Enlarged prostate without lower urinary tract symptoms: Secondary | ICD-10-CM | POA: Diagnosis not present

## 2017-02-19 DIAGNOSIS — Z6825 Body mass index (BMI) 25.0-25.9, adult: Secondary | ICD-10-CM | POA: Diagnosis not present

## 2017-02-19 DIAGNOSIS — Z96652 Presence of left artificial knee joint: Secondary | ICD-10-CM | POA: Diagnosis not present

## 2017-02-19 DIAGNOSIS — R569 Unspecified convulsions: Secondary | ICD-10-CM | POA: Diagnosis not present

## 2017-02-19 DIAGNOSIS — R630 Anorexia: Secondary | ICD-10-CM | POA: Diagnosis not present

## 2017-02-21 DIAGNOSIS — C801 Malignant (primary) neoplasm, unspecified: Secondary | ICD-10-CM | POA: Insufficient documentation

## 2017-02-21 DIAGNOSIS — G250 Essential tremor: Secondary | ICD-10-CM | POA: Insufficient documentation

## 2017-02-21 DIAGNOSIS — R011 Cardiac murmur, unspecified: Secondary | ICD-10-CM | POA: Insufficient documentation

## 2017-02-21 DIAGNOSIS — K219 Gastro-esophageal reflux disease without esophagitis: Secondary | ICD-10-CM | POA: Insufficient documentation

## 2017-02-21 DIAGNOSIS — E785 Hyperlipidemia, unspecified: Secondary | ICD-10-CM | POA: Insufficient documentation

## 2017-02-21 DIAGNOSIS — E079 Disorder of thyroid, unspecified: Secondary | ICD-10-CM | POA: Insufficient documentation

## 2017-02-21 DIAGNOSIS — E039 Hypothyroidism, unspecified: Secondary | ICD-10-CM | POA: Insufficient documentation

## 2017-02-21 DIAGNOSIS — I10 Essential (primary) hypertension: Secondary | ICD-10-CM | POA: Insufficient documentation

## 2017-02-21 DIAGNOSIS — R0602 Shortness of breath: Secondary | ICD-10-CM | POA: Insufficient documentation

## 2017-02-21 DIAGNOSIS — N400 Enlarged prostate without lower urinary tract symptoms: Secondary | ICD-10-CM | POA: Insufficient documentation

## 2017-02-21 DIAGNOSIS — M1990 Unspecified osteoarthritis, unspecified site: Secondary | ICD-10-CM | POA: Insufficient documentation

## 2017-02-21 DIAGNOSIS — F39 Unspecified mood [affective] disorder: Secondary | ICD-10-CM | POA: Insufficient documentation

## 2017-02-21 DIAGNOSIS — G4730 Sleep apnea, unspecified: Secondary | ICD-10-CM | POA: Insufficient documentation

## 2017-02-24 DIAGNOSIS — R350 Frequency of micturition: Secondary | ICD-10-CM | POA: Diagnosis not present

## 2017-02-24 DIAGNOSIS — N3941 Urge incontinence: Secondary | ICD-10-CM | POA: Diagnosis not present

## 2017-03-02 DIAGNOSIS — R2681 Unsteadiness on feet: Secondary | ICD-10-CM | POA: Diagnosis not present

## 2017-03-02 DIAGNOSIS — D649 Anemia, unspecified: Secondary | ICD-10-CM | POA: Diagnosis not present

## 2017-03-02 DIAGNOSIS — E559 Vitamin D deficiency, unspecified: Secondary | ICD-10-CM | POA: Diagnosis not present

## 2017-03-02 DIAGNOSIS — M6281 Muscle weakness (generalized): Secondary | ICD-10-CM | POA: Diagnosis not present

## 2017-03-02 DIAGNOSIS — Z6825 Body mass index (BMI) 25.0-25.9, adult: Secondary | ICD-10-CM | POA: Diagnosis not present

## 2017-03-02 DIAGNOSIS — Z96652 Presence of left artificial knee joint: Secondary | ICD-10-CM | POA: Diagnosis not present

## 2017-03-02 DIAGNOSIS — R5381 Other malaise: Secondary | ICD-10-CM | POA: Diagnosis not present

## 2017-03-02 DIAGNOSIS — E038 Other specified hypothyroidism: Secondary | ICD-10-CM | POA: Diagnosis not present

## 2017-03-02 DIAGNOSIS — R413 Other amnesia: Secondary | ICD-10-CM | POA: Diagnosis not present

## 2017-03-02 DIAGNOSIS — I10 Essential (primary) hypertension: Secondary | ICD-10-CM | POA: Diagnosis not present

## 2017-03-02 DIAGNOSIS — R4182 Altered mental status, unspecified: Secondary | ICD-10-CM | POA: Diagnosis not present

## 2017-03-02 DIAGNOSIS — R569 Unspecified convulsions: Secondary | ICD-10-CM | POA: Diagnosis not present

## 2017-03-08 ENCOUNTER — Encounter: Payer: Self-pay | Admitting: Cardiovascular Disease

## 2017-03-08 ENCOUNTER — Ambulatory Visit (INDEPENDENT_AMBULATORY_CARE_PROVIDER_SITE_OTHER): Payer: MEDICARE | Admitting: Cardiovascular Disease

## 2017-03-08 VITALS — BP 124/78 | HR 76 | Ht 72.0 in | Wt 185.0 lb

## 2017-03-08 DIAGNOSIS — R569 Unspecified convulsions: Secondary | ICD-10-CM | POA: Diagnosis not present

## 2017-03-08 DIAGNOSIS — R55 Syncope and collapse: Secondary | ICD-10-CM

## 2017-03-08 DIAGNOSIS — M6281 Muscle weakness (generalized): Secondary | ICD-10-CM | POA: Diagnosis not present

## 2017-03-08 DIAGNOSIS — E7800 Pure hypercholesterolemia, unspecified: Secondary | ICD-10-CM | POA: Diagnosis not present

## 2017-03-08 DIAGNOSIS — R4182 Altered mental status, unspecified: Secondary | ICD-10-CM | POA: Diagnosis not present

## 2017-03-08 DIAGNOSIS — I10 Essential (primary) hypertension: Secondary | ICD-10-CM

## 2017-03-08 DIAGNOSIS — G250 Essential tremor: Secondary | ICD-10-CM | POA: Diagnosis not present

## 2017-03-08 DIAGNOSIS — I700 Atherosclerosis of aorta: Secondary | ICD-10-CM | POA: Diagnosis not present

## 2017-03-08 DIAGNOSIS — R2681 Unsteadiness on feet: Secondary | ICD-10-CM | POA: Diagnosis not present

## 2017-03-08 DIAGNOSIS — D6489 Other specified anemias: Secondary | ICD-10-CM | POA: Diagnosis not present

## 2017-03-08 DIAGNOSIS — R251 Tremor, unspecified: Secondary | ICD-10-CM | POA: Diagnosis not present

## 2017-03-08 NOTE — Progress Notes (Signed)
Cardiology Office Note    Date:  03/09/2017   ID:  Randy Sullivan, DOB Apr 17, 1928, MRN [id number]  PCP:  Prince Solian, MD  Cardiologist:   Sanda Klein, MD   Chief Complaint  Patient presents with  . Follow-up     Syncope versus seizures  History of Present Illness:  Randy Sullivan is a 84 y.o. male with long-standing hypertension and hyperlipidemia, mild obstructive sleep apnea, treated hypothyroidism returning in follow-up.  Randy Sullivan is accompanied today by a caregiver, but his wife Randy Sullivan joined [place] via phone during the office visit.  She offered a lot of details and insight into friends recent problems.  He has had 5 syncopal events, all of them witnessed by his wife.  All of them have been brief, lasting at most about 5 minutes.  None of them have been associated with falls and all of them have occurred while sitting up.  The first episode happened while he was in the nursing home recovering after total knee replacement.  He was sitting up in a wheelchair and was felt that maybe he had an unusual reaction to the muscle relaxant he was not responsive to voice or tactile stimulation for about 5 minutes.  He recovered without being laid flat.  Roughly 1 month later he had another syncopal event while sitting on the commode.  She could not wake him up.  She had to call 911 and it was felt that this was probably a vasovagal event.  One month after that he had an episode while sitting on a barstool after breakfast.  Emergency services again taken to the ED.  It was very similar to the episode on the commode and was attributed by dehydration.  A fourth episode again happened sitting on the same barstool after breakfast not long after the previous one and most recently he had an episode of loss of responsiveness sitting in a chair while in the hospital after knee replacement surgery.  This latter event occurred while he was on telemetry and no arrhythmia was reportedly documented.  He had mild  orthostatic hypotension, mild hyponatremia and mild elevation in creatinine, all of which suggested that he may have had hypotension.  Outside of the acute event he was noted to have frequent PVCs on telemetry and he was continued on propranolol.  Cardiac enzymes are normal.  A 30-day event monitor was recommended at discharge.  This has not yet been performed.  His workup has included a brain MRI and EEG.  Although I am not sure he had clear documentation of seizures, he has been started on Keppra and in the 2 months since that medication was initiated he has not had any new "spells".  Randy Sullivan denies dizziness, palpitations, shortness of breath or chest discomfort and has no recollection of his episodes of loss of consciousness.  About 2 years ago he underwent a treadmill stress test and was only able to exercise for 3 minutes and 10 seconds, but did not have any abnormal ECG changes or chest discomfort. He was easily able to achieve a heart rate equal to maximum predicted for his age.   An echocardiogram performed during his hospitalization in November 2018 was unremarkable.  There is a mention of moderate basal septal hypertrophy but there is no outflow tract obstruction.  EF was 60% and wall motion is normal.  Even his diastolic function parameters were described as normal.  There was no evidence of carotid stenosis by duplex ultrasonography.  In the  past, pulmonary function tests showed mild COPD (Dr. Corrie Dandy). Sleep study showed mild obstructive sleep apnea (Dr. Rexene Alberts).    Past Medical History:  Diagnosis Date  . BPH (benign prostatic hyperplasia)   . Cancer ([place])    non melanoma skin cancers removed  . DJD (degenerative joint disease)   . Familial tremor   . GERD (gastroesophageal reflux disease)   . Heart murmur    hx of  . Hyperlipidemia   . Hypertension   . Hypothyroidism   . Mood disorder ([place])   . Osteoarthritis   . Prostatism   . Shortness of breath    mild with excertion  .  Sleep apnea    cpap  . Syncope 01/13/2017  . Thyroid disease     Past Surgical History:  Procedure Laterality Date  . APPENDECTOMY    . EYE SURGERY     bil cataracts  . HEMORROIDECTOMY    . INNER EAR SURGERY Right    With impant.   . TONSILLECTOMY    . TOTAL KNEE ARTHROPLASTY Left 11/08/2016   Procedure: LEFT TOTAL KNEE ARTHROPLASTY;  Surgeon: Gaynelle Arabian, MD;  Location: [facility];  Service: Orthopedics;  Laterality: Left;  Adductor Block    Current Medications: Outpatient Medications Prior to Visit  Medication Sig Dispense Refill  . albuterol (PROVENTIL HFA;VENTOLIN HFA) 108 (90 Base) MCG/ACT inhaler Inhale 2 puffs into the lungs every 4 (four) hours as needed for wheezing or shortness of breath. 1 Inhaler 2  . aspirin 81 MG chewable tablet Chew 1 tablet (81 mg total) by mouth daily. 30 tablet 0  . fluticasone (FLONASE) 50 MCG/ACT nasal spray Place 1 spray into both nostrils daily as needed for allergies.     . fluticasone furoate-vilanterol (BREO ELLIPTA) 100-25 MCG/INH AEPB Inhale 1 puff into the lungs daily. (Patient taking differently: Inhale 1 puff into the lungs at bedtime. ) 1 each 6  . gabapentin (NEURONTIN) 100 MG capsule Take 100 mg by mouth at bedtime.    . iron polysaccharides (NIFEREX) 150 MG capsule Take 150 mg by mouth daily.    Marland Kitchen levETIRAcetam (KEPPRA) 500 MG tablet Take 1 tablet (500 mg total) by mouth 2 (two) times daily. 60 tablet 0  . levothyroxine (SYNTHROID, LEVOTHROID) 112 MCG tablet Take 112 mcg by mouth daily before breakfast.     . loratadine (CLARITIN) 10 MG tablet Take 10 mg by mouth daily as needed for allergies.     . megestrol (MEGACE) 20 MG tablet Take 20-40 mg by mouth See admin instructions. Take 2 tablets (40mg ) before breakfast and 1 tablet (20mg ) before supper.    . montelukast (SINGULAIR) 10 MG tablet Take 1 tablet (10 mg total) by mouth at bedtime. 30 tablet 6  . MYRBETRIQ 50 MG TB24 tablet Take 50 mg by mouth daily.     Marland Kitchen omeprazole (PRILOSEC)  20 MG capsule Take 20 mg by mouth 2 (two) times daily before a meal.     . Polyethyl Glycol-Propyl Glycol (SYSTANE OP) Apply 1-2 drops to eye daily as needed (dry eyes).     . polyethylene glycol (MIRALAX / GLYCOLAX) packet Take 17 g by mouth daily as needed for mild constipation.    . propranolol (INDERAL) 20 MG tablet Take 20 mg by mouth daily.     . rosuvastatin (CRESTOR) 5 MG tablet Take 5 mg by mouth at bedtime.     . solifenacin (VESICARE) 5 MG tablet Take 5 mg by mouth every evening.  No facility-administered medications prior to visit.      Allergies:   Tizanidine   Social History   Socioeconomic History  . Marital status: Married    Spouse name: None  . Number of children: None  . Years of education: None  . Highest education level: None  Social Needs  . Financial resource strain: None  . Food insecurity - worry: None  . Food insecurity - inability: None  . Transportation needs - medical: None  . Transportation needs - non-medical: None  Occupational History  . Occupation: retired  Tobacco Use  . Smoking status: Former Smoker    Packs/day: 2.00    Years: 18.00    Pack years: 36.00    Types: Cigarettes    Last attempt to quit: 06/04/1970    Years since quitting: 46.7  . Smokeless tobacco: Never Used  Substance and Sexual Activity  . Alcohol use: Yes    Alcohol/week: 0.0 oz    Comment: special occasions  . Drug use: No  . Sexual activity: Not Currently  Other Topics Concern  . None  Social History Narrative   Rare caffeine use      Family History:  The patient's family history includes Colon cancer in his mother and sister; Heart attack in his father.   ROS:   Please see the history of present illness.    ROS All other systems reviewed and are negative.   PHYSICAL EXAM:   VS:  BP 124/78   Pulse 76   Ht 6' (1.829 m)   Wt 185 lb (83.9 kg)   BMI 25.09 kg/m     General: Alert, oriented x3, no distress, smiling, comfortable Head: no evidence of  trauma, PERRL, EOMI, no exophtalmos or lid lag, no myxedema, no xanthelasma; normal ears, nose and oropharynx Neck: normal jugular venous pulsations and no hepatojugular reflux; brisk carotid pulses without delay and no carotid bruits Chest: clear to auscultation, no signs of consolidation by percussion or palpation, normal fremitus, symmetrical and full respiratory excursions Cardiovascular: normal position and quality of the apical impulse, regular rhythm, normal first and second heart sounds, no murmurs, rubs or gallops Abdomen: no tenderness or distention, no masses by palpation, no abnormal pulsatility or arterial bruits, normal bowel sounds, no hepatosplenomegaly Extremities: no clubbing, cyanosis or edema; 2+ radial, ulnar and brachial pulses bilaterally; 2+ right femoral, posterior tibial and dorsalis pedis pulses; 2+ left femoral, posterior tibial and dorsalis pedis pulses; no subclavian or femoral bruits Neurological: grossly nonfocal; appears to have short-term memory problems Psych: Normal mood and affect   Wt Readings from Last 3 Encounters:  03/08/17 185 lb (83.9 kg)  01/15/17 179 lb 1.6 oz (81.2 kg)  12/18/16 190 lb (86.2 kg)      Studies/Labs Reviewed:   EKG:  EKG is ordered today.  It shows sinus rhythm with a single PVC and is otherwise a completely normal tracing.  QTc 452 ms  ASSESSMENT:    1. Syncope, unspecified syncope type   2. Essential hypertension   3. Hypercholesterolemia   4. Thoracic aortic atherosclerosis ([place])   5. Essential tremor      PLAN:  In order of problems listed above:  1. Syncope: The exact cause was episodes of unresponsiveness has not been definitively established, but he seems to have had a good response to antiepileptic medications.  He may have been having absence seizures.  Talked to Mr. and Mrs. Bolls about further cardiology workup and we have decided to defer this unless  new episodes occur while he is on the anticonvulsant.  I  would not have him wear a 30-day event monitor but could go directly to implantation of a loop recorder which will have the best yield as far as the possibility of arrhythmic syncope.  Did not have evidence of chronotropic incompetence on treadmill testing. On the contrary, heart rate response was exaggerated suggesting deconditioning. 2. HTN: Generally with excellent control.  No evidence of orthostatic hypotension today.  Does have mild LVH on echo.  "Diastolic dysfunction"was reported on an older echo but not confirmed on his most recent study.  He does not have any findings to suggest congestive heart failure. He should not be prescribed diuretics in my opinion. 3. HLP: he has had markedly elevated LDL cholesterol levels, but he does not have established coronary disease or other serious vascular problems. Aortic atherosclerosis is seen on imaging studies (CT chest 2011).  He is now on rosuvastatin 4. Ao atherosclerosis:  No history of TIA or stroke. Taking aspirin and statin. 5. Essential tremor: Improved with beta blocker.  Continue propranolol.  There has been no evidence of any bradycardia or clear-cut evidence of hypotension associated with his "spells".    Medication Adjustments/Labs and Tests Ordered: Current medicines are reviewed at length with the patient today.  Concerns regarding medicines are outlined above.  Medication changes, Labs and Tests ordered today are listed in the Patient Instructions below. Patient Instructions  Dr Sallyanne Kuster recommends that you schedule a follow-up appointment in 12 months. You will receive a reminder letter in the mail two months in advance. If you don't receive a letter, please call our office to schedule the follow-up appointment.  If you need a refill on your cardiac medications before your next appointment, please call your pharmacy.    Signed, Sanda Klein, MD  03/09/2017 3:56 PM    [address] [place],  [place], [place]  [address] Phone: [phone]; Fax: [phone]

## 2017-03-08 NOTE — Patient Instructions (Signed)
Dr Croitoru recommends that you schedule a follow-up appointment in 12 months. You will receive a reminder letter in the mail two months in advance. If you don't receive a letter, please call our office to schedule the follow-up appointment.  If you need a refill on your cardiac medications before your next appointment, please call your pharmacy. 

## 2017-03-14 ENCOUNTER — Ambulatory Visit: Payer: PRIVATE HEALTH INSURANCE | Admitting: Neurology

## 2017-03-16 DIAGNOSIS — R5381 Other malaise: Secondary | ICD-10-CM | POA: Diagnosis not present

## 2017-03-16 DIAGNOSIS — Z1389 Encounter for screening for other disorder: Secondary | ICD-10-CM | POA: Diagnosis not present

## 2017-03-16 DIAGNOSIS — Z6825 Body mass index (BMI) 25.0-25.9, adult: Secondary | ICD-10-CM | POA: Diagnosis not present

## 2017-03-16 DIAGNOSIS — R630 Anorexia: Secondary | ICD-10-CM | POA: Diagnosis not present

## 2017-03-16 DIAGNOSIS — G4733 Obstructive sleep apnea (adult) (pediatric): Secondary | ICD-10-CM | POA: Diagnosis not present

## 2017-03-16 DIAGNOSIS — R569 Unspecified convulsions: Secondary | ICD-10-CM | POA: Diagnosis not present

## 2017-03-17 ENCOUNTER — Ambulatory Visit: Payer: MEDICARE | Admitting: Cardiovascular Disease

## 2017-03-17 DIAGNOSIS — N3941 Urge incontinence: Secondary | ICD-10-CM | POA: Diagnosis not present

## 2017-03-17 DIAGNOSIS — R350 Frequency of micturition: Secondary | ICD-10-CM | POA: Diagnosis not present

## 2017-04-05 DIAGNOSIS — N3941 Urge incontinence: Secondary | ICD-10-CM | POA: Diagnosis not present

## 2017-04-05 DIAGNOSIS — R350 Frequency of micturition: Secondary | ICD-10-CM | POA: Diagnosis not present

## 2017-04-06 ENCOUNTER — Ambulatory Visit (INDEPENDENT_AMBULATORY_CARE_PROVIDER_SITE_OTHER): Payer: MEDICARE | Admitting: Neurology

## 2017-04-06 ENCOUNTER — Encounter: Payer: Self-pay | Admitting: Neurology

## 2017-04-06 VITALS — BP 161/89 | HR 84 | Ht 72.0 in | Wt 183.0 lb

## 2017-04-06 DIAGNOSIS — R55 Syncope and collapse: Secondary | ICD-10-CM

## 2017-04-06 DIAGNOSIS — R6889 Other general symptoms and signs: Secondary | ICD-10-CM

## 2017-04-06 NOTE — Patient Instructions (Addendum)
Please don't drive a car any longer.  Please stay well hydrated with water, about 6 cups/glasses.  Please continue with the seizure medication, Keppra 500 mg twice daily. Please check if you need a refill, Dr. Dagmar Hait may have taken care of it already. We will check in with you in 6 months.

## 2017-04-06 NOTE — Progress Notes (Signed)
Subjective:    Patient ID: Randy Sullivan is a 84 y.o. male.  HPI     History:   Dear Dr. Dagmar Hait,   I saw your patient, Randy Sullivan, upon your kind request in my neurologic clinic today for a new problem visit of recent hospitalization for syncope and seizure concerned. The patient is accompanied by Delfino Lovett, a friend, today. As you know, Randy Sullivan is an 37 year old right-handed gentleman with an underlying medical history of hypothyroidism, hypertension, reflux disease, BPH, actinic keratosis, degenerative joint disease, seasonal allergies, tremors, sleep apnea, and PLMS, who I have previously seen for his sleep disturbance and PLMD. I last saw him in April 2018, at which time he was doing reasonably well on gabapentin at night. I suggested as needed follow-up. He was followed by pulmonology for his obstructive sleep apnea.  Of note, he was hospitalized in November from 01/13/2017 through 01/15/2017 after two spells of unresponsiveness at the house. He was transported via EMS to the hospital. Workup in the hospital setting included echocardiogram which was negative for any sinister changes, EF was reportedly normal, carotid Doppler study without significant stenosis. Final Interpretation: Right Carotid: There is evidence in the right ICA of a 1-39% stenosis. Left Carotid: There is evidence in the left ICA of a 1-39% stenosis. Vertebrals: Both vertebral arteries were patent with antegrade flow.  He was readmitted to the hospital on 01/18/2017 and discharged on 01/21/2017 with concern for recurrent syncopal spell versus seizure activity. He was not witnessed to have any seizure activity as in convulsion or twitching, but had a witnessed speech arrest. EEG on 01/18/2017 was nonspecific showing FIRDA, brain MRI without contrast and MRA head without contrast on 01/19/2017 showed: IMPRESSION: Brain MRI:   1. Truncated study due to patient request. 2. No acute finding including infarct. 3. Moderate  atrophy. 4. Chronic small vessel ischemia including remote lacunar infarct in the right thalamus.   Intracranial MRA:   Mild atheromatous changes. No acute finding or flow limiting stenosis.  He was suspected to have likely syncopal spells but was started on low-dose Keppra on a suspicion of possible seizure disorder. He was suspected to have memory loss and outpatient neuropsychological evaluation was recommended as well.  He reports feeling better, no specific complaints, but sleepy during the day. He has some stressors currently including his wife's cancer diagnosis and having had her gallbladder removed, he had left total knee arthroplasty in September 2018. He believes that he needs his right knee replaced. He has not had any more episodes of collapsing. He admits that he may not drink enough water, does not know how much she actually drinks water, he does report that his wife is typically on his case for drinking more water. His wife is a retired Marine scientist and his friend Richard's wife also was a Marine scientist and they both work together. They do not have any children but have some help at the house through a home health agency. He reports still taking the medication that was given to him through the hospital, Keppra, 500 mg twice daily but is not sure how many refills he has. He may have received refill through your office he believes.   I reviewed your office note from 01/24/2017.   Previously:  I saw him on 12/18/2015, at which time he reported doing better. He was less restless, was taking gabapentin 100 mg at night. He had some knee pain bilaterally and ankle pain. He was complaining of nocturia, he was followed by  urology and also had treatment for his bladder hyperactivity. I suggested he try to increase his gabapentin to 200 mg each night. I encouraged him to start using a cane.    I saw him on 08/11/2015, at which time we talked about his sleep study results from 07/30/2015, indicating mild  obstructive sleep apnea and severe PLMS. I suggested symptomatic treatment for his PLMD with gabapentin low-dose. We also talked about potentially trying CPAP therapy for his overall mild sleep apnea with an AHI of 7.5 per hour, worse in supine sleep. His wife had noted leg twitching in the past 2 or 3 months. His nocturia was about the same. He was on home oxygen. He denies restless leg symptoms but did report discomfort in his left knee and left ankle.    I first met him on 07/09/2015 at the request of his primary care physician, at which time the patient reported snoring, nonrestorative sleep, significant nocturia and daytime tiredness. He had recently undergone an overnight pulse oximetry test of which the results were pending at the time. In the interim, I reviewed his test results on 07/29/2015: Pulse oximetry overnight was done on 07/04/2015. Average oxygen saturation was 94.7%, total test time 8 hours and 9 minutes. Lowest oxygen saturation 85%, time below 88% saturation of 4.6 minutes. He was placed on oxygen at night by his primary care physician. He had a baseline sleep study on 07/30/2015: Sleep efficiency was 47.4%, latency to sleep 114.5 minutes, wake after sleep onset was 141.5 minutes with several longer periods of wakefulness and 6 restaurant breaks. The patient wanted to contact the sleep study with his home oxygen level of 2 L/m and therefore the study was done with his supplemental oxygen. He was noted to have severe PLMS. Index was 114.5 per hour, resulting in only 2.3 arousals per hour. He had rare PVCs on EKG. He had an increased percentage of stage II sleep, slow-wave sleep was 10.2%, REM sleep was 14.3%, REM latency was prolonged at 138 minutes. He had minimal snoring. Total AHI was 7.5 per hour. Average oxygen saturation was 97%, nadir was 94%, again with supplemental oxygen at 2 L/m.   07/09/2015: He reports snoring, daytime tiredness, nonrestorative sleep nocturia (3-4 x/night). I  reviewed your office note from 06/25/2015. He quit smoking many years ago, over 30 years ago. He drinks alcohol occasionally, he does not use illicit drugs, he drinks non-caffeine coffee. Per wife, he is sleepy during the day. His Epworth sleepiness score is 12 out of 24 today, his fatigue score is 39/63. He is a retired Engineer, structural. His first wife passed away in 2004-05-08. He remarried in 05/09/07.   He is no longer on Proscar or Flomax, he had side effects from them, including feeling off balance, he is currently on Myrbetriq.   He takes low-dose propanolol, 10 mg twice daily for his tremors which has been helpful. He has had some weight loss and loss of appetite in the past weeks to months. He had recent blood work in your office, CMP and CBC. You also ordered a CT head without contrast. He was complaining of memory loss. He had a head CT without contrast on 07/02/2015: IMPRESSION: No acute abnormality noted.   He also had a recent nocturnal pulse oximetry test through your office, results are pending, this test was done just 2 nights ago. We will request test results from your office.   In addition, I personally reviewed the images through the PACS system.  He complains of lack of energy and the need to lay down to rest and he may doze off.   He has aching in the L knee and he twitches his feet in sleep, per wife. He sees Dr. Maureen Ralphs for this has had injections to the L knee, including Synvisc.  Sometimes he has has bifrontal head pressure in the mornings.   He does not have a sleep apnea family history. He has an older sister, she is not very well, has not been diagnosed with sleep apnea. He has no children.   His Past Medical History Is Significant For: Past Medical History:  Diagnosis Date  . BPH (benign prostatic hyperplasia)   . Cancer ([place])    non melanoma skin cancers removed  . DJD (degenerative joint disease)   . Familial tremor   . GERD (gastroesophageal reflux disease)   . Heart  murmur    hx of  . Hyperlipidemia   . Hypertension   . Hypothyroidism   . Mood disorder ([place])   . Osteoarthritis   . Prostatism   . Shortness of breath    mild with excertion  . Sleep apnea    cpap  . Syncope 01/13/2017  . Thyroid disease     His Past Surgical History Is Significant For: Past Surgical History:  Procedure Laterality Date  . APPENDECTOMY    . EYE SURGERY     bil cataracts  . HEMORROIDECTOMY    . INNER EAR SURGERY Right    With impant.   . TONSILLECTOMY    . TOTAL KNEE ARTHROPLASTY Left 11/08/2016   Procedure: LEFT TOTAL KNEE ARTHROPLASTY;  Surgeon: Gaynelle Arabian, MD;  Location: [facility];  Service: Orthopedics;  Laterality: Left;  Adductor Block    His Family History Is Significant For: Family History  Problem Relation Age of Onset  . Heart attack Father   . Colon cancer Mother   . Colon cancer Sister     His Social History Is Significant For: Social History   Socioeconomic History  . Marital status: Married    Spouse name: None  . Number of children: None  . Years of education: None  . Highest education level: None  Social Needs  . Financial resource strain: None  . Food insecurity - worry: None  . Food insecurity - inability: None  . Transportation needs - medical: None  . Transportation needs - non-medical: None  Occupational History  . Occupation: retired  Tobacco Use  . Smoking status: Former Smoker    Packs/day: 2.00    Years: 18.00    Pack years: 36.00    Types: Cigarettes    Last attempt to quit: 06/04/1970    Years since quitting: 46.8  . Smokeless tobacco: Never Used  Substance and Sexual Activity  . Alcohol use: Yes    Alcohol/week: 0.0 oz    Comment: special occasions  . Drug use: No  . Sexual activity: Not Currently  Other Topics Concern  . None  Social History Narrative   Rare caffeine use     His Allergies Are:  Allergies  Allergen Reactions  . Tizanidine Other (See Comments)    Hypotension   :   His Current  Medications Are:  Outpatient Encounter Medications as of 04/06/2017  Medication Sig  . albuterol (PROVENTIL HFA;VENTOLIN HFA) 108 (90 Base) MCG/ACT inhaler Inhale 2 puffs into the lungs every 4 (four) hours as needed for wheezing or shortness of breath.  Marland Kitchen aspirin 81 MG chewable tablet Chew  1 tablet (81 mg total) by mouth daily.  . fluticasone (FLONASE) 50 MCG/ACT nasal spray Place 1 spray into both nostrils daily as needed for allergies.   . fluticasone furoate-vilanterol (BREO ELLIPTA) 100-25 MCG/INH AEPB Inhale 1 puff into the lungs daily. (Patient taking differently: Inhale 1 puff into the lungs at bedtime. )  . gabapentin (NEURONTIN) 100 MG capsule Take 100 mg by mouth at bedtime.  . iron polysaccharides (NIFEREX) 150 MG capsule Take 150 mg by mouth daily.  Marland Kitchen levETIRAcetam (KEPPRA) 500 MG tablet Take 1 tablet (500 mg total) by mouth 2 (two) times daily.  Marland Kitchen levothyroxine (SYNTHROID, LEVOTHROID) 112 MCG tablet Take 112 mcg by mouth daily before breakfast.   . loratadine (CLARITIN) 10 MG tablet Take 10 mg by mouth daily as needed for allergies.   . megestrol (MEGACE) 20 MG tablet Take 20-40 mg by mouth See admin instructions. Take 2 tablets ('40mg'$ ) before breakfast and 1 tablet ('20mg'$ ) before supper.  . montelukast (SINGULAIR) 10 MG tablet Take 1 tablet (10 mg total) by mouth at bedtime.  Marland Kitchen MYRBETRIQ 50 MG TB24 tablet Take 50 mg by mouth daily.   Marland Kitchen omeprazole (PRILOSEC) 20 MG capsule Take 20 mg by mouth 2 (two) times daily before a meal.   . Polyethyl Glycol-Propyl Glycol (SYSTANE OP) Apply 1-2 drops to eye daily as needed (dry eyes).   . polyethylene glycol (MIRALAX / GLYCOLAX) packet Take 17 g by mouth daily as needed for mild constipation.  . propranolol (INDERAL) 20 MG tablet Take 20 mg by mouth daily.   . rosuvastatin (CRESTOR) 5 MG tablet Take 5 mg by mouth at bedtime.   . solifenacin (VESICARE) 5 MG tablet Take 5 mg by mouth every evening.   No facility-administered encounter medications  on file as of 04/06/2017.   :  Review of Systems:  Out of a complete 14 point review of systems, all are reviewed and negative with the exception of these symptoms as listed below:  Review of Systems  Neurological:       Pt presents today to discuss his episodes of "slumping over". Pt has not fallen with these episodes. One time, he was sitting at the bar in his kitchen and he slumped over forward. Pt thinks he has had 3-4 episodes.    Objective:  Neurological Exam  Physical Exam Physical Examination:   Vitals:   04/06/17 1515  BP: (!) 161/89  Pulse: 84   General Examination: The patient is a very pleasant 84 y.o. male in no acute distress. He appears frail, he is well groomed.   HEENT: Normocephalic, atraumatic, pupils are equal, round and reactive to light and accommodation. Extraocular tracking is good without limitation to gaze excursion or nystagmus noted. Normal smooth pursuit is noted. Hearing is grossly intact. Face is symmetric with normal facial animation and normal facial sensation. Speech is clear with no dysarthria noted. There is no hypophonia. There is no lip, neck/head, jaw tremor, slight voice tremor. Oropharynx exam reveals: mild to moderate mouth dryness, adequate dental hygiene and mild airway crowding. Mallampati is class II. Tongue protrudes centrally and palate elevates symmetrically.   Chest: Clear to auscultation without wheezing, rhonchi or crackles noted.  Heart: S1+S2+0, regular and normal without murmurs, rubs or gallops noted.   Abdomen: Soft, non-tender and non-distended with normal bowel sounds appreciated on auscultation.  Extremities: There is no pitting edema in the distal lower extremities bilaterally. Pedal pulses are intact.  Skin: Warm and dry without trophic changes noted.  Musculoskeletal: exam reveals mild right knee swelling and discomfort, well-healed scar left knee.     Neurologically:  Mental status: The patient is awake,  alert and oriented in all 4 spheres. His immediate and remote memory are more impaired and he is slower in thinking. Mood is normal and affect is normal.  Cranial nerves II - XII are as described above under HEENT exam. In addition: shoulder shrug is normal with equal shoulder height noted. Motor exam: Normal bulk, strength normal for age, and normal tone is noted. There is no drift, resting tremor. Romberg is not tested for safety. He has a mild and stable tremor. Fine motor skills are globally mildly impaired. He stands up with difficulty and uses his walker. Sensory exam is intact to light touch, reflexes are 1+ in the upper extremities, trace in both knees, absent in both ankles.  Assessment and Plan:    In summary, Randy Sullivan is a very pleasant 84 year old male with an underlying medical history of hypothyroidism, hypertension, reflux disease, BPH, actinic keratosis, degenerative joint disease, particularly of the knees, with status post left total knee replacement in September 2018, tremor, seasonal allergies, left ankle fracture, whopresents for a new visit for any interim problem of recurrent presyncopal spells, syncope and collapse, suspicion for seizure. He had extensive workup during his 2 recent hospitalizations in November. He was placed on Keppra for seizure precaution. He is indicating that he is still taking this but is not 100% sure. He may have received a refill from your office. He is encouraged to continue the medication, he may have developed more sleepiness from it. He is under a lot of stress what with his own recent surgery, ongoing right knee pain, and his wife cancer diagnosis and recurrent surgery. Thankfully, he has help at the house and he has good support from friends. He is advised to not drive. He is advised to stay better hydrated with water. He may not always do a good job with his water intake. I would like for him to come back for recheck in our office in about 6  months, he can see one of our nurse practitioners. I answered all their questions today and the patient and his friend Delfino Lovett were in agreement. follow-up consultationof his sleep disturbance, including PLMs and history of mild OSA. He had a sleep study on 07/30/2015 which showed an overall AHI of 7.5 per hour, O2 nadir reassuringly at 94% only. He had severe PLMS with an index of 114.5 per hour, with minimal arousals. Nevertheless, I suggested we try him on low-dose gabapentin.  he was on 100 mg and gradually increase this to 300 mg, most recently by his PCP around January 2018. He felt that gabapentin was helpful. He saw in the interim pulmonology and was started on AutoPap therapy. He has issues more recently with mask leakage and mouth dryness. He is encouraged to talk to the respiratory therapist again. He can also talk to his DME company again about these issues. From my end of things I suggested he continue with gabapentin 300 mg at night but I would recommend not increasing this any further for fear of side effects including daytime somnolence and balance problems. He is encouraged to use a cane for gait safety. He also suffers from bilateral knee and ankle pain and may be at risk for falls considering all these issues. I suggested as needed follow-up from my end of things. I answered all their questions today and the patient  and his wife were in agreement.  Star Age, MD, PhD [facility])

## 2017-04-15 ENCOUNTER — Other Ambulatory Visit: Payer: Self-pay

## 2017-04-15 ENCOUNTER — Emergency Department (HOSPITAL_COMMUNITY): Payer: MEDICARE

## 2017-04-15 ENCOUNTER — Emergency Department (HOSPITAL_COMMUNITY)
Admission: EM | Admit: 2017-04-15 | Discharge: 2017-04-15 | Disposition: A | Discharge status: Home / Self Care | Payer: MEDICARE | Attending: Emergency Medicine | Admitting: Emergency Medicine

## 2017-04-15 ENCOUNTER — Encounter (HOSPITAL_COMMUNITY): Payer: Self-pay

## 2017-04-15 DIAGNOSIS — Z87891 Personal history of nicotine dependence: Secondary | ICD-10-CM | POA: Insufficient documentation

## 2017-04-15 DIAGNOSIS — Z96652 Presence of left artificial knee joint: Secondary | ICD-10-CM | POA: Insufficient documentation

## 2017-04-15 DIAGNOSIS — Z7982 Long term (current) use of aspirin: Secondary | ICD-10-CM | POA: Insufficient documentation

## 2017-04-15 DIAGNOSIS — J9811 Atelectasis: Secondary | ICD-10-CM | POA: Diagnosis not present

## 2017-04-15 DIAGNOSIS — Z79899 Other long term (current) drug therapy: Secondary | ICD-10-CM | POA: Diagnosis not present

## 2017-04-15 DIAGNOSIS — E039 Hypothyroidism, unspecified: Secondary | ICD-10-CM | POA: Insufficient documentation

## 2017-04-15 DIAGNOSIS — R402441 Other coma, without documented Glasgow coma scale score, or with partial score reported, in the field [EMT or ambulance]: Secondary | ICD-10-CM | POA: Diagnosis not present

## 2017-04-15 DIAGNOSIS — I10 Essential (primary) hypertension: Secondary | ICD-10-CM | POA: Insufficient documentation

## 2017-04-15 DIAGNOSIS — R4020 Unspecified coma: Secondary | ICD-10-CM | POA: Diagnosis not present

## 2017-04-15 DIAGNOSIS — R4182 Altered mental status, unspecified: Secondary | ICD-10-CM | POA: Diagnosis present

## 2017-04-15 DIAGNOSIS — R404 Transient alteration of awareness: Secondary | ICD-10-CM | POA: Diagnosis not present

## 2017-04-15 LAB — COMPREHENSIVE METABOLIC PANEL
ALK PHOS: 55 U/L (ref 38–126)
ALT: 13 U/L — AB (ref 17–63)
AST: 17 U/L (ref 15–41)
Albumin: 3.7 g/dL (ref 3.5–5.0)
Anion gap: 10 (ref 5–15)
BUN: 28 mg/dL — AB (ref 6–20)
CALCIUM: 9.1 mg/dL (ref 8.9–10.3)
CHLORIDE: 107 mmol/L (ref 101–111)
CO2: 20 mmol/L — AB (ref 22–32)
Creatinine, Ser: 1.43 mg/dL — ABNORMAL HIGH (ref 0.61–1.24)
GFR, EST AFRICAN AMERICAN: 50 mL/min — AB (ref >60)
GFR, EST NON AFRICAN AMERICAN: 43 mL/min — AB (ref >60)
Glucose, Bld: 99 mg/dL (ref 65–99)
Potassium: 4 mmol/L (ref 3.5–5.1)
SODIUM: 137 mmol/L (ref 135–145)
Total Bilirubin: 0.9 mg/dL (ref 0.3–1.2)
Total Protein: 6.6 g/dL (ref 6.5–8.1)

## 2017-04-15 LAB — CBC WITH DIFFERENTIAL/PLATELET
BASOS PCT: 1 %
Basophils Absolute: 0.1 10*3/uL (ref 0.0–0.1)
EOS ABS: 0.9 10*3/uL — AB (ref 0.0–0.7)
Eosinophils Relative: 11 %
HCT: 32.8 % — ABNORMAL LOW (ref 39.0–52.0)
HEMOGLOBIN: 11.3 g/dL — AB (ref 13.0–17.0)
LYMPHS ABS: 2.1 10*3/uL (ref 0.7–4.0)
Lymphocytes Relative: 27 %
MCH: 31.6 pg (ref 26.0–34.0)
MCHC: 34.5 g/dL (ref 30.0–36.0)
MCV: 91.6 fL (ref 78.0–100.0)
MONOS PCT: 9 %
Monocytes Absolute: 0.7 10*3/uL (ref 0.1–1.0)
NEUTROS PCT: 52 %
Neutro Abs: 4.1 10*3/uL (ref 1.7–7.7)
PLATELETS: 214 10*3/uL (ref 150–400)
RBC: 3.58 MIL/uL — ABNORMAL LOW (ref 4.22–5.81)
RDW: 13.5 % (ref 11.5–15.5)
WBC: 7.9 10*3/uL (ref 4.0–10.5)

## 2017-04-15 LAB — TROPONIN I

## 2017-04-15 NOTE — ED Triage Notes (Signed)
[facility]- pt coming from home after he went unresponsive at the breakfast table. Pt was not breathing per caregiver. EMS also reports pt was unresponsive. Initial BP 50 palpated, improved to 120/78 after 500cc of fluid. CBG 174.

## 2017-04-15 NOTE — Discharge Instructions (Signed)
Return if any problems.

## 2017-04-15 NOTE — ED Notes (Signed)
Pt placed on bedpan

## 2017-04-15 NOTE — ED Notes (Addendum)
Jobie Quaker, family friend who is patient's transportation [phone]

## 2017-04-15 NOTE — ED Notes (Signed)
Home Health Nurse Supervisor, call when we know if patient is being admitted or not. [phone].

## 2017-04-15 NOTE — ED Provider Notes (Signed)
[facility] EMERGENCY DEPARTMENT Provider Note   CSN: [id number] Arrival date & time: 04/15/17  1201     History   Chief Complaint Chief Complaint  Patient presents with  . Altered Mental Status    HPI Randy Sullivan is a 84 y.o. male.  The history is provided by the patient. No language interpreter was used.  Altered Mental Status   This is a new problem. The current episode started less than 1 hour ago. The problem has not changed since onset.Associated symptoms include somnolence and unresponsiveness. Pertinent negatives include no confusion. His past medical history does not include seizures, psychotropic medication treatment, head trauma or heart disease.  Pt is reported to have passed out/became unresponsive while his aide was at his home.  She reports pt was not breathing.  She reports she patted pt and called his name.  She reports pt came to when she did this. Pt was recently seen by neurology on 2/13 for a similar episode.  Pt had a EEG and MRi.  Seizure was not ruled out completely   Past Medical History:  Diagnosis Date  . BPH (benign prostatic hyperplasia)   . Cancer ([place])    non melanoma skin cancers removed  . DJD (degenerative joint disease)   . Familial tremor   . GERD (gastroesophageal reflux disease)   . Heart murmur    hx of  . Hyperlipidemia   . Hypertension   . Hypothyroidism   . Mood disorder ([place])   . Osteoarthritis   . Prostatism   . Shortness of breath    mild with excertion  . Sleep apnea    cpap  . Syncope 01/13/2017  . Thyroid disease     Patient Active Problem List   Diagnosis Date Noted  . Thyroid disease   . Sleep apnea   . Shortness of breath   . Prostatism   . Osteoarthritis   . Mood disorder ([place])   . Hypothyroidism   . Hypertension   . Hyperlipidemia   . Heart murmur   . GERD (gastroesophageal reflux disease)   . Familial tremor   . DJD (degenerative joint disease)   . Cancer ([place])   . Seizure ([place])  01/21/2017  . Transient speech disturbance   . CKD (chronic kidney disease), stage III ([place]) 01/13/2017  . Syncope 01/13/2017  . OA (osteoarthritis) of knee 11/08/2016  . Essential hypertension 02/05/2016  . Thoracic aortic atherosclerosis ([place]) 02/05/2016  . Benign familial tremor 02/05/2016  . COPD (chronic obstructive pulmonary disease) ([place]) 01/21/2016  . OSA (obstructive sleep apnea) 10/23/2015  . Upper airway cough syndrome 10/23/2015  . Exertional dyspnea 10/23/2015  . RLS (restless legs syndrome) 10/23/2015  . Osteoarthritis of knee 06/04/2010  . Dizziness 06/04/2010  . Benign hypertensive heart disease without heart failure 06/04/2010  . Hypercholesterolemia 06/04/2010  . BPH (benign prostatic hyperplasia) 06/04/2010    Past Surgical History:  Procedure Laterality Date  . APPENDECTOMY    . EYE SURGERY     bil cataracts  . HEMORROIDECTOMY    . INNER EAR SURGERY Right    With impant.   . TONSILLECTOMY    . TOTAL KNEE ARTHROPLASTY Left 11/08/2016   Procedure: LEFT TOTAL KNEE ARTHROPLASTY;  Surgeon: Gaynelle Arabian, MD;  Location: [facility];  Service: Orthopedics;  Laterality: Left;  Adductor Block       Home Medications    Prior to Admission medications   Medication Sig Start Date End Date Taking? Authorizing Provider  albuterol (PROVENTIL HFA;VENTOLIN HFA) 108 (90 Base) MCG/ACT inhaler Inhale 2 puffs into the lungs every 4 (four) hours as needed for wheezing or shortness of breath. 01/21/16   de Dios, [place] A, MD  aspirin 81 MG chewable tablet Chew 1 tablet (81 mg total) by mouth daily. 01/16/17   Regalado, [place] A, MD  fluticasone (FLONASE) 50 MCG/ACT nasal spray Place 1 spray into both nostrils daily as needed for allergies.     [provider]  fluticasone furoate-vilanterol (BREO ELLIPTA) 100-25 MCG/INH AEPB Inhale 1 puff into the lungs daily. Patient taking differently: Inhale 1 puff into the lungs at bedtime.  08/20/16   Parrett, Fonnie Mu, NP    gabapentin (NEURONTIN) 100 MG capsule Take 100 mg by mouth at bedtime. 10/28/16   [provider]  iron polysaccharides (NIFEREX) 150 MG capsule Take 150 mg by mouth daily.    [provider]  levETIRAcetam (KEPPRA) 500 MG tablet Take 1 tablet (500 mg total) by mouth 2 (two) times daily. 01/20/17   Debbe Odea, MD  levothyroxine (SYNTHROID, LEVOTHROID) 112 MCG tablet Take 112 mcg by mouth daily before breakfast.  10/14/14   [provider]  loratadine (CLARITIN) 10 MG tablet Take 10 mg by mouth daily as needed for allergies.     [provider]  megestrol (MEGACE) 20 MG tablet Take 20-40 mg by mouth See admin instructions. Take 2 tablets (40mg ) before breakfast and 1 tablet (20mg ) before supper.    [provider]  montelukast (SINGULAIR) 10 MG tablet Take 1 tablet (10 mg total) by mouth at bedtime. 09/07/16   Parrett, Fonnie Mu, NP  MYRBETRIQ 50 MG TB24 tablet Take 50 mg by mouth daily.  06/03/15   [provider]  omeprazole (PRILOSEC) 20 MG capsule Take 20 mg by mouth 2 (two) times daily before a meal.     [provider]  Polyethyl Glycol-Propyl Glycol (SYSTANE OP) Apply 1-2 drops to eye daily as needed (dry eyes).     [provider]  polyethylene glycol (MIRALAX / GLYCOLAX) packet Take 17 g by mouth daily as needed for mild constipation.    [provider]  propranolol (INDERAL) 20 MG tablet Take 20 mg by mouth daily.  06/18/16   [provider]  rosuvastatin (CRESTOR) 5 MG tablet Take 5 mg by mouth at bedtime.  06/23/16   [provider]  solifenacin (VESICARE) 5 MG tablet Take 5 mg by mouth every evening.    [provider]    Family History Family History  Problem Relation Age of Onset  . Heart attack Father   . Colon cancer Mother   . Colon cancer Sister     Social History Social History   Tobacco Use  . Smoking status: Former Smoker    Packs/day: 2.00    Years: 18.00    Pack  years: 36.00    Types: Cigarettes    Last attempt to quit: 06/04/1970    Years since quitting: 46.8  . Smokeless tobacco: Never Used  Substance Use Topics  . Alcohol use: Yes    Alcohol/week: 0.0 oz    Comment: special occasions  . Drug use: No     Allergies   Tizanidine   Review of Systems Review of Systems  Psychiatric/Behavioral: Negative for confusion.  All other systems reviewed and are negative.    Physical Exam Updated Vital Signs BP (!) 148/73 (BP Location: Left Arm)   Pulse 87   Temp (!) 97.2  F (36.2 C) (Oral)   Resp 18   SpO2 98%   Physical Exam  Constitutional: He appears well-developed and well-nourished.  HENT:  Head: Normocephalic and atraumatic.  Mouth/Throat: Oropharynx is clear and moist.  Eyes: Conjunctivae are normal.  Neck: Normal range of motion. Neck supple.  Cardiovascular: Normal rate and regular rhythm.  No murmur heard. Pulmonary/Chest: Effort normal and breath sounds normal. No respiratory distress.  Abdominal: Soft. There is no tenderness.  Musculoskeletal: Normal range of motion. He exhibits no edema.  Neurological: He is alert.  Oriented to self, place, lunch and care taker,  Unsure of month.   Skin: Skin is warm and dry.  Psychiatric: He has a normal mood and affect.  Confused,  Pleasant ,   Nursing note and vitals reviewed.    ED Treatments / Results  Labs (all labs ordered are listed, but only abnormal results are displayed) Labs Reviewed  COMPREHENSIVE METABOLIC PANEL - Abnormal; Notable for the following components:      Result Value   CO2 20 (*)    BUN 28 (*)    Creatinine, Ser 1.43 (*)    ALT 13 (*)    GFR calc non Af Amer 43 (*)    GFR calc Af Amer 50 (*)    All other components within normal limits  CBC WITH DIFFERENTIAL/PLATELET - Abnormal; Notable for the following components:   RBC 3.58 (*)    Hemoglobin 11.3 (*)    HCT 32.8 (*)    Eosinophils Absolute 0.9 (*)    All other components within normal limits   TROPONIN I  URINALYSIS, COMPLETE (UACMP) WITH MICROSCOPIC    EKG  EKG Interpretation  Date/Time:  Friday April 15 2017 12:22:15 EST Ventricular Rate:  87 PR Interval:    QRS Duration: 110 QT Interval:  400 QTC Calculation: 482 R Axis:   80 Text Interpretation:  Sinus rhythm Low voltage, extremity and precordial leads Minimal ST elevation, inferior leads Borderline prolonged QT interval Confirmed by Isla Pence [phone]) on 04/15/2017 12:31:44 PM       Radiology Dg Chest 2 View  Result Date: 04/15/2017 CLINICAL DATA:  Altered mental status. EXAM: CHEST  2 VIEW COMPARISON:  01/18/2017. FINDINGS: Mediastinum hilar structures normal. Heart size normal. Low lung volumes with mild basilar atelectasis. No pleural effusions or pneumothorax. Scattered air-fluid levels are noted over the abdomen. Abdominal series can be obtained for further evaluation. IMPRESSION: 1. Low lung volumes with mild basilar atelectasis. Stable mild cardiomegaly. 2. Scattered air-fluid levels are noted over the abdomen. Abdominal series can be obtained to further evaluate. Electronically Signed   By: Marcello Moores  Register   On: 04/15/2017 13:13   Ct Head Wo Contrast  Result Date: 04/15/2017 CLINICAL DATA:  Altered level of consciousness. EXAM: CT HEAD WITHOUT CONTRAST TECHNIQUE: Contiguous axial images were obtained from the base of the skull through the vertex without intravenous contrast. COMPARISON:  MRI head 01/19/2017 FINDINGS: Brain: Moderate atrophy. Moderate chronic microvascular ischemic changes in the white matter. Negative for acute infarct. Negative for hemorrhage or mass. Vascular: Negative for hyperdense vessel Skull: Negative Sinuses/Orbits: Mild mucosal edema paranasal sinuses. Bilateral lens replacement. Other: None IMPRESSION: No acute abnormality. Atrophy and chronic microvascular ischemic changes, stable from prior imaging Electronically Signed   By: Franchot Gallo M.D.   On: 04/15/2017 13:23     Procedures Procedures (including critical care time)  Medications Ordered in ED Medications - No data to display   Initial Impression / Assessment and Plan /  ED Course  I have reviewed the triage vital signs and the nursing notes.  Pertinent labs & imaging results that were available during my care of the patient were reviewed by me and considered in my medical decision making (see chart for details).     MDM  Caretaker here. Pt may have fallen asleep,  Pt has a history of similar episodes.  Pt is worried about his wife who is hospitalized at [facility].  Pt has home care in place.   No sign of heart attack, aspiration or stroke.  Pt seems safe to go home with follow up with primary care MD.   Final Clinical Impressions(s) / ED Diagnoses   Final diagnoses:  Transient alteration of awareness    ED Discharge Orders    None    An After Visit Summary was printed and given to the patient.    Fransico Meadow, [place] 04/15/17 1445    Isla Pence, MD 04/15/17 [phone]

## 2017-04-28 ENCOUNTER — Other Ambulatory Visit: Payer: Self-pay | Admitting: Adult Health

## 2017-04-28 DIAGNOSIS — R3915 Urgency of urination: Secondary | ICD-10-CM | POA: Diagnosis not present

## 2017-04-28 DIAGNOSIS — N3941 Urge incontinence: Secondary | ICD-10-CM | POA: Diagnosis not present

## 2017-04-29 DIAGNOSIS — L299 Pruritus, unspecified: Secondary | ICD-10-CM | POA: Diagnosis not present

## 2017-04-29 DIAGNOSIS — Z6824 Body mass index (BMI) 24.0-24.9, adult: Secondary | ICD-10-CM | POA: Diagnosis not present

## 2017-04-29 DIAGNOSIS — R159 Full incontinence of feces: Secondary | ICD-10-CM | POA: Diagnosis not present

## 2017-04-29 DIAGNOSIS — R198 Other specified symptoms and signs involving the digestive system and abdomen: Secondary | ICD-10-CM | POA: Diagnosis not present

## 2017-04-29 DIAGNOSIS — R5381 Other malaise: Secondary | ICD-10-CM | POA: Diagnosis not present

## 2017-05-03 DIAGNOSIS — Z7982 Long term (current) use of aspirin: Secondary | ICD-10-CM | POA: Diagnosis not present

## 2017-05-03 DIAGNOSIS — R2681 Unsteadiness on feet: Secondary | ICD-10-CM | POA: Diagnosis not present

## 2017-05-03 DIAGNOSIS — Z9181 History of falling: Secondary | ICD-10-CM | POA: Diagnosis not present

## 2017-05-03 DIAGNOSIS — M6281 Muscle weakness (generalized): Secondary | ICD-10-CM | POA: Diagnosis not present

## 2017-05-03 DIAGNOSIS — F0390 Unspecified dementia without behavioral disturbance: Secondary | ICD-10-CM | POA: Diagnosis not present

## 2017-05-03 DIAGNOSIS — I10 Essential (primary) hypertension: Secondary | ICD-10-CM | POA: Diagnosis not present

## 2017-05-05 DIAGNOSIS — I10 Essential (primary) hypertension: Secondary | ICD-10-CM | POA: Diagnosis not present

## 2017-05-05 DIAGNOSIS — Z9181 History of falling: Secondary | ICD-10-CM | POA: Diagnosis not present

## 2017-05-05 DIAGNOSIS — R2681 Unsteadiness on feet: Secondary | ICD-10-CM | POA: Diagnosis not present

## 2017-05-05 DIAGNOSIS — Z7982 Long term (current) use of aspirin: Secondary | ICD-10-CM | POA: Diagnosis not present

## 2017-05-05 DIAGNOSIS — F0390 Unspecified dementia without behavioral disturbance: Secondary | ICD-10-CM | POA: Diagnosis not present

## 2017-05-05 DIAGNOSIS — M6281 Muscle weakness (generalized): Secondary | ICD-10-CM | POA: Diagnosis not present

## 2017-05-09 DIAGNOSIS — F0390 Unspecified dementia without behavioral disturbance: Secondary | ICD-10-CM | POA: Diagnosis not present

## 2017-05-09 DIAGNOSIS — Z9181 History of falling: Secondary | ICD-10-CM | POA: Diagnosis not present

## 2017-05-09 DIAGNOSIS — I10 Essential (primary) hypertension: Secondary | ICD-10-CM | POA: Diagnosis not present

## 2017-05-09 DIAGNOSIS — R2681 Unsteadiness on feet: Secondary | ICD-10-CM | POA: Diagnosis not present

## 2017-05-09 DIAGNOSIS — Z7982 Long term (current) use of aspirin: Secondary | ICD-10-CM | POA: Diagnosis not present

## 2017-05-09 DIAGNOSIS — M6281 Muscle weakness (generalized): Secondary | ICD-10-CM | POA: Diagnosis not present

## 2017-05-11 DIAGNOSIS — M6281 Muscle weakness (generalized): Secondary | ICD-10-CM | POA: Diagnosis not present

## 2017-05-11 DIAGNOSIS — R2681 Unsteadiness on feet: Secondary | ICD-10-CM | POA: Diagnosis not present

## 2017-05-11 DIAGNOSIS — Z9181 History of falling: Secondary | ICD-10-CM | POA: Diagnosis not present

## 2017-05-11 DIAGNOSIS — I10 Essential (primary) hypertension: Secondary | ICD-10-CM | POA: Diagnosis not present

## 2017-05-11 DIAGNOSIS — Z7982 Long term (current) use of aspirin: Secondary | ICD-10-CM | POA: Diagnosis not present

## 2017-05-11 DIAGNOSIS — F0390 Unspecified dementia without behavioral disturbance: Secondary | ICD-10-CM | POA: Diagnosis not present

## 2017-05-16 DIAGNOSIS — M6281 Muscle weakness (generalized): Secondary | ICD-10-CM | POA: Diagnosis not present

## 2017-05-16 DIAGNOSIS — R2681 Unsteadiness on feet: Secondary | ICD-10-CM | POA: Diagnosis not present

## 2017-05-16 DIAGNOSIS — F0390 Unspecified dementia without behavioral disturbance: Secondary | ICD-10-CM | POA: Diagnosis not present

## 2017-05-16 DIAGNOSIS — Z9181 History of falling: Secondary | ICD-10-CM | POA: Diagnosis not present

## 2017-05-16 DIAGNOSIS — Z7982 Long term (current) use of aspirin: Secondary | ICD-10-CM | POA: Diagnosis not present

## 2017-05-16 DIAGNOSIS — I10 Essential (primary) hypertension: Secondary | ICD-10-CM | POA: Diagnosis not present

## 2017-05-19 DIAGNOSIS — N3941 Urge incontinence: Secondary | ICD-10-CM | POA: Diagnosis not present

## 2017-05-23 DIAGNOSIS — F0390 Unspecified dementia without behavioral disturbance: Secondary | ICD-10-CM | POA: Diagnosis not present

## 2017-05-23 DIAGNOSIS — Z7982 Long term (current) use of aspirin: Secondary | ICD-10-CM | POA: Diagnosis not present

## 2017-05-23 DIAGNOSIS — I10 Essential (primary) hypertension: Secondary | ICD-10-CM | POA: Diagnosis not present

## 2017-05-23 DIAGNOSIS — R2681 Unsteadiness on feet: Secondary | ICD-10-CM | POA: Diagnosis not present

## 2017-05-23 DIAGNOSIS — Z9181 History of falling: Secondary | ICD-10-CM | POA: Diagnosis not present

## 2017-05-23 DIAGNOSIS — M6281 Muscle weakness (generalized): Secondary | ICD-10-CM | POA: Diagnosis not present

## 2017-05-30 DIAGNOSIS — H26493 Other secondary cataract, bilateral: Secondary | ICD-10-CM | POA: Diagnosis not present

## 2017-05-30 DIAGNOSIS — Z7982 Long term (current) use of aspirin: Secondary | ICD-10-CM | POA: Diagnosis not present

## 2017-05-30 DIAGNOSIS — H52203 Unspecified astigmatism, bilateral: Secondary | ICD-10-CM | POA: Diagnosis not present

## 2017-05-30 DIAGNOSIS — R2681 Unsteadiness on feet: Secondary | ICD-10-CM | POA: Diagnosis not present

## 2017-05-30 DIAGNOSIS — Z9181 History of falling: Secondary | ICD-10-CM | POA: Diagnosis not present

## 2017-05-30 DIAGNOSIS — M6281 Muscle weakness (generalized): Secondary | ICD-10-CM | POA: Diagnosis not present

## 2017-05-30 DIAGNOSIS — F0390 Unspecified dementia without behavioral disturbance: Secondary | ICD-10-CM | POA: Diagnosis not present

## 2017-05-30 DIAGNOSIS — I10 Essential (primary) hypertension: Secondary | ICD-10-CM | POA: Diagnosis not present

## 2017-06-08 DIAGNOSIS — Z9181 History of falling: Secondary | ICD-10-CM | POA: Diagnosis not present

## 2017-06-08 DIAGNOSIS — F0390 Unspecified dementia without behavioral disturbance: Secondary | ICD-10-CM | POA: Diagnosis not present

## 2017-06-08 DIAGNOSIS — M6281 Muscle weakness (generalized): Secondary | ICD-10-CM | POA: Diagnosis not present

## 2017-06-08 DIAGNOSIS — Z7982 Long term (current) use of aspirin: Secondary | ICD-10-CM | POA: Diagnosis not present

## 2017-06-08 DIAGNOSIS — I10 Essential (primary) hypertension: Secondary | ICD-10-CM | POA: Diagnosis not present

## 2017-06-08 DIAGNOSIS — R2681 Unsteadiness on feet: Secondary | ICD-10-CM | POA: Diagnosis not present

## 2017-06-13 DIAGNOSIS — Z9181 History of falling: Secondary | ICD-10-CM | POA: Diagnosis not present

## 2017-06-13 DIAGNOSIS — R2681 Unsteadiness on feet: Secondary | ICD-10-CM | POA: Diagnosis not present

## 2017-06-13 DIAGNOSIS — M6281 Muscle weakness (generalized): Secondary | ICD-10-CM | POA: Diagnosis not present

## 2017-06-13 DIAGNOSIS — I10 Essential (primary) hypertension: Secondary | ICD-10-CM | POA: Diagnosis not present

## 2017-06-13 DIAGNOSIS — Z7982 Long term (current) use of aspirin: Secondary | ICD-10-CM | POA: Diagnosis not present

## 2017-06-13 DIAGNOSIS — F0390 Unspecified dementia without behavioral disturbance: Secondary | ICD-10-CM | POA: Diagnosis not present

## 2017-06-14 DIAGNOSIS — R350 Frequency of micturition: Secondary | ICD-10-CM | POA: Diagnosis not present

## 2017-06-14 DIAGNOSIS — R3915 Urgency of urination: Secondary | ICD-10-CM | POA: Diagnosis not present

## 2017-06-22 ENCOUNTER — Other Ambulatory Visit: Payer: Self-pay | Admitting: Adult Health

## 2017-07-04 ENCOUNTER — Encounter: Payer: Self-pay | Admitting: Pulmonary Disease

## 2017-07-04 ENCOUNTER — Ambulatory Visit: Payer: MEDICARE | Admitting: Pulmonary Disease

## 2017-07-04 ENCOUNTER — Ambulatory Visit (INDEPENDENT_AMBULATORY_CARE_PROVIDER_SITE_OTHER): Payer: MEDICARE | Admitting: Pulmonary Disease

## 2017-07-04 DIAGNOSIS — G4733 Obstructive sleep apnea (adult) (pediatric): Secondary | ICD-10-CM | POA: Diagnosis not present

## 2017-07-04 DIAGNOSIS — J449 Chronic obstructive pulmonary disease, unspecified: Secondary | ICD-10-CM

## 2017-07-04 NOTE — Assessment & Plan Note (Signed)
He had very mild sleep apnea to begin with.  He has trial CPAP therapy and is unable to use it about 2 hours every night.  This is not been effective and he continues to be sleepy in the daytime more due to circadian disorder and due to severe nocturia from his prostatism. I do not think CPAP is helping him at all but only adding a burden and decreasing his quality of life.  I have asked him to stop using this We will check him again once in 6 months to make sure that the symptoms are no worse

## 2017-07-04 NOTE — Progress Notes (Signed)
   Subjective:    Patient ID: Randy Sullivan, male    DOB: 21-Apr-1928, 84 y.o.   MRN: [id number]  HPI  84 year old male followed for obstructive sleep apnea and COPD   He ambulates with a walker.  Denies shortness of breath.  He seldom remembers to use breo. Wife [name], does not report any wheezing.  No chest colds since last visit. He has severe nocturia due to prostatism and is awake the whole night.  He denies his breakfast and goes right back to sleep and sometimes sleeps until mid day.  He is a nasal mask and feels that CPAP was not really helped him.  He often forgets to put it back on after he gets up for bathroom. CPAP download shows auto settings with average pressure of 12 cm and residual AHI 40/hour which is classified on the download as obstructive events, mild leak  Significant tests/ events reviewed  6/ 2017 NPSG AHI was 7.5, severe PLms but minimal arousals  11/2015 PFT  ratio 70,FEV1 2.63 90%, DLCO 58%  Past Medical History:  Diagnosis Date  . BPH (benign prostatic hyperplasia)   . Cancer ([facility])    non melanoma skin cancers removed  . DJD (degenerative joint disease)   . Familial tremor   . GERD (gastroesophageal reflux disease)   . Heart murmur    hx of  . Hyperlipidemia   . Hypertension   . Hypothyroidism   . Mood disorder ([place])   . Osteoarthritis   . Prostatism   . Shortness of breath    mild with excertion  . Sleep apnea    cpap  . Syncope 01/13/2017  . Thyroid disease      Review of Systems Patient denies significant dyspnea,cough, hemoptysis,  chest pain, palpitations, pedal edema, orthopnea, paroxysmal nocturnal dyspnea, lightheadedness, nausea, vomiting, abdominal or  leg pains      Objective:   Physical Exam  Gen. Pleasant, elderly, in no distress ENT - no thrush, no post nasal drip, slow speech Neck: No JVD, no thyromegaly, no carotid bruits Lungs: no use of accessory muscles, no dullness to percussion, clear without rales or rhonchi    Cardiovascular: Rhythm regular, heart sounds  normal, no murmurs or gallops, no peripheral edema Musculoskeletal: No deformities, no cyanosis or clubbing        Assessment & Plan:

## 2017-07-04 NOTE — Assessment & Plan Note (Signed)
Not sure his lung disease was worse enough for him to be on medications. We will stop breo and see how he does, he does not seem to be using this anyways

## 2017-07-04 NOTE — Patient Instructions (Signed)
STOP taking Breo Okay to trial stopping CPAP machine use  Call us if symptoms worsen

## 2017-07-05 DIAGNOSIS — R350 Frequency of micturition: Secondary | ICD-10-CM | POA: Diagnosis not present

## 2017-07-05 DIAGNOSIS — N390 Urinary tract infection, site not specified: Secondary | ICD-10-CM | POA: Diagnosis not present

## 2017-07-05 DIAGNOSIS — E038 Other specified hypothyroidism: Secondary | ICD-10-CM | POA: Diagnosis not present

## 2017-07-05 DIAGNOSIS — R2681 Unsteadiness on feet: Secondary | ICD-10-CM | POA: Diagnosis not present

## 2017-07-05 DIAGNOSIS — M6281 Muscle weakness (generalized): Secondary | ICD-10-CM | POA: Diagnosis not present

## 2017-07-05 DIAGNOSIS — R5383 Other fatigue: Secondary | ICD-10-CM | POA: Diagnosis not present

## 2017-07-05 DIAGNOSIS — D6489 Other specified anemias: Secondary | ICD-10-CM | POA: Diagnosis not present

## 2017-07-05 DIAGNOSIS — R569 Unspecified convulsions: Secondary | ICD-10-CM | POA: Diagnosis not present

## 2017-07-07 DIAGNOSIS — R350 Frequency of micturition: Secondary | ICD-10-CM | POA: Diagnosis not present

## 2017-07-11 DIAGNOSIS — R55 Syncope and collapse: Secondary | ICD-10-CM | POA: Diagnosis not present

## 2017-07-12 DIAGNOSIS — R569 Unspecified convulsions: Secondary | ICD-10-CM | POA: Diagnosis not present

## 2017-07-12 DIAGNOSIS — I10 Essential (primary) hypertension: Secondary | ICD-10-CM | POA: Diagnosis not present

## 2017-07-12 DIAGNOSIS — F0390 Unspecified dementia without behavioral disturbance: Secondary | ICD-10-CM | POA: Diagnosis not present

## 2017-07-12 DIAGNOSIS — R260 Ataxic gait: Secondary | ICD-10-CM | POA: Diagnosis not present

## 2017-07-12 DIAGNOSIS — Z9181 History of falling: Secondary | ICD-10-CM | POA: Diagnosis not present

## 2017-07-12 DIAGNOSIS — M6281 Muscle weakness (generalized): Secondary | ICD-10-CM | POA: Diagnosis not present

## 2017-07-15 DIAGNOSIS — I10 Essential (primary) hypertension: Secondary | ICD-10-CM | POA: Diagnosis not present

## 2017-07-15 DIAGNOSIS — M6281 Muscle weakness (generalized): Secondary | ICD-10-CM | POA: Diagnosis not present

## 2017-07-15 DIAGNOSIS — Z9181 History of falling: Secondary | ICD-10-CM | POA: Diagnosis not present

## 2017-07-15 DIAGNOSIS — R260 Ataxic gait: Secondary | ICD-10-CM | POA: Diagnosis not present

## 2017-07-15 DIAGNOSIS — R82998 Other abnormal findings in urine: Secondary | ICD-10-CM | POA: Diagnosis not present

## 2017-07-15 DIAGNOSIS — E038 Other specified hypothyroidism: Secondary | ICD-10-CM | POA: Diagnosis not present

## 2017-07-15 DIAGNOSIS — E559 Vitamin D deficiency, unspecified: Secondary | ICD-10-CM | POA: Diagnosis not present

## 2017-07-15 DIAGNOSIS — Z125 Encounter for screening for malignant neoplasm of prostate: Secondary | ICD-10-CM | POA: Diagnosis not present

## 2017-07-15 DIAGNOSIS — E7800 Pure hypercholesterolemia, unspecified: Secondary | ICD-10-CM | POA: Diagnosis not present

## 2017-07-15 DIAGNOSIS — R569 Unspecified convulsions: Secondary | ICD-10-CM | POA: Diagnosis not present

## 2017-07-15 DIAGNOSIS — F0390 Unspecified dementia without behavioral disturbance: Secondary | ICD-10-CM | POA: Diagnosis not present

## 2017-07-19 DIAGNOSIS — I10 Essential (primary) hypertension: Secondary | ICD-10-CM | POA: Diagnosis not present

## 2017-07-19 DIAGNOSIS — Z9181 History of falling: Secondary | ICD-10-CM | POA: Diagnosis not present

## 2017-07-19 DIAGNOSIS — R569 Unspecified convulsions: Secondary | ICD-10-CM | POA: Diagnosis not present

## 2017-07-19 DIAGNOSIS — R260 Ataxic gait: Secondary | ICD-10-CM | POA: Diagnosis not present

## 2017-07-19 DIAGNOSIS — M6281 Muscle weakness (generalized): Secondary | ICD-10-CM | POA: Diagnosis not present

## 2017-07-19 DIAGNOSIS — F0390 Unspecified dementia without behavioral disturbance: Secondary | ICD-10-CM | POA: Diagnosis not present

## 2017-07-22 ENCOUNTER — Ambulatory Visit: Payer: PRIVATE HEALTH INSURANCE | Admitting: Podiatry

## 2017-07-22 DIAGNOSIS — I10 Essential (primary) hypertension: Secondary | ICD-10-CM | POA: Diagnosis not present

## 2017-07-22 DIAGNOSIS — Z1389 Encounter for screening for other disorder: Secondary | ICD-10-CM | POA: Diagnosis not present

## 2017-07-22 DIAGNOSIS — Z0000 Encounter for general adult medical examination without abnormal findings: Secondary | ICD-10-CM | POA: Diagnosis not present

## 2017-07-22 DIAGNOSIS — R569 Unspecified convulsions: Secondary | ICD-10-CM | POA: Diagnosis not present

## 2017-07-22 DIAGNOSIS — E7849 Other hyperlipidemia: Secondary | ICD-10-CM | POA: Diagnosis not present

## 2017-07-22 DIAGNOSIS — I519 Heart disease, unspecified: Secondary | ICD-10-CM | POA: Diagnosis not present

## 2017-07-22 DIAGNOSIS — Z6825 Body mass index (BMI) 25.0-25.9, adult: Secondary | ICD-10-CM | POA: Diagnosis not present

## 2017-07-22 DIAGNOSIS — E038 Other specified hypothyroidism: Secondary | ICD-10-CM | POA: Diagnosis not present

## 2017-07-22 DIAGNOSIS — I700 Atherosclerosis of aorta: Secondary | ICD-10-CM | POA: Diagnosis not present

## 2017-07-22 DIAGNOSIS — D649 Anemia, unspecified: Secondary | ICD-10-CM | POA: Diagnosis not present

## 2017-07-22 DIAGNOSIS — M79601 Pain in right arm: Secondary | ICD-10-CM | POA: Diagnosis not present

## 2017-07-22 DIAGNOSIS — G4733 Obstructive sleep apnea (adult) (pediatric): Secondary | ICD-10-CM | POA: Diagnosis not present

## 2017-07-22 DIAGNOSIS — F39 Unspecified mood [affective] disorder: Secondary | ICD-10-CM | POA: Diagnosis not present

## 2017-07-28 DIAGNOSIS — R350 Frequency of micturition: Secondary | ICD-10-CM | POA: Diagnosis not present

## 2017-07-29 DIAGNOSIS — Z1212 Encounter for screening for malignant neoplasm of rectum: Secondary | ICD-10-CM | POA: Diagnosis not present

## 2017-08-01 DIAGNOSIS — M25511 Pain in right shoulder: Secondary | ICD-10-CM | POA: Diagnosis not present

## 2017-08-03 DIAGNOSIS — M6281 Muscle weakness (generalized): Secondary | ICD-10-CM | POA: Diagnosis not present

## 2017-08-03 DIAGNOSIS — R569 Unspecified convulsions: Secondary | ICD-10-CM | POA: Diagnosis not present

## 2017-08-03 DIAGNOSIS — I10 Essential (primary) hypertension: Secondary | ICD-10-CM | POA: Diagnosis not present

## 2017-08-03 DIAGNOSIS — Z9181 History of falling: Secondary | ICD-10-CM | POA: Diagnosis not present

## 2017-08-03 DIAGNOSIS — R260 Ataxic gait: Secondary | ICD-10-CM | POA: Diagnosis not present

## 2017-08-03 DIAGNOSIS — F0390 Unspecified dementia without behavioral disturbance: Secondary | ICD-10-CM | POA: Diagnosis not present

## 2017-08-08 DIAGNOSIS — M19011 Primary osteoarthritis, right shoulder: Secondary | ICD-10-CM | POA: Diagnosis not present

## 2017-08-08 DIAGNOSIS — M25511 Pain in right shoulder: Secondary | ICD-10-CM | POA: Diagnosis not present

## 2017-08-11 DIAGNOSIS — R569 Unspecified convulsions: Secondary | ICD-10-CM | POA: Diagnosis not present

## 2017-08-11 DIAGNOSIS — R260 Ataxic gait: Secondary | ICD-10-CM | POA: Diagnosis not present

## 2017-08-11 DIAGNOSIS — F0390 Unspecified dementia without behavioral disturbance: Secondary | ICD-10-CM | POA: Diagnosis not present

## 2017-08-11 DIAGNOSIS — M6281 Muscle weakness (generalized): Secondary | ICD-10-CM | POA: Diagnosis not present

## 2017-08-11 DIAGNOSIS — Z9181 History of falling: Secondary | ICD-10-CM | POA: Diagnosis not present

## 2017-08-11 DIAGNOSIS — I10 Essential (primary) hypertension: Secondary | ICD-10-CM | POA: Diagnosis not present

## 2017-08-12 DIAGNOSIS — M1711 Unilateral primary osteoarthritis, right knee: Secondary | ICD-10-CM | POA: Diagnosis not present

## 2017-08-17 ENCOUNTER — Institutional Professional Consult (permissible substitution): Payer: Medicare Other | Admitting: Neurology

## 2017-08-17 ENCOUNTER — Encounter

## 2017-08-30 DIAGNOSIS — M1711 Unilateral primary osteoarthritis, right knee: Secondary | ICD-10-CM | POA: Diagnosis not present

## 2017-09-01 DIAGNOSIS — N3941 Urge incontinence: Secondary | ICD-10-CM | POA: Diagnosis not present

## 2017-09-01 DIAGNOSIS — R350 Frequency of micturition: Secondary | ICD-10-CM | POA: Diagnosis not present

## 2017-09-02 DIAGNOSIS — M1711 Unilateral primary osteoarthritis, right knee: Secondary | ICD-10-CM | POA: Diagnosis not present

## 2017-09-05 ENCOUNTER — Other Ambulatory Visit: Payer: Self-pay | Admitting: Adult Health

## 2017-09-08 DIAGNOSIS — M1711 Unilateral primary osteoarthritis, right knee: Secondary | ICD-10-CM | POA: Diagnosis not present

## 2017-09-13 ENCOUNTER — Ambulatory Visit (INDEPENDENT_AMBULATORY_CARE_PROVIDER_SITE_OTHER): Payer: MEDICARE | Admitting: Neurology

## 2017-09-13 ENCOUNTER — Encounter: Payer: Self-pay | Admitting: Neurology

## 2017-09-13 VITALS — BP 151/83 | HR 68 | Ht 71.0 in | Wt 174.0 lb

## 2017-09-13 DIAGNOSIS — M1711 Unilateral primary osteoarthritis, right knee: Secondary | ICD-10-CM | POA: Diagnosis not present

## 2017-09-13 DIAGNOSIS — R55 Syncope and collapse: Secondary | ICD-10-CM | POA: Diagnosis not present

## 2017-09-13 DIAGNOSIS — R6889 Other general symptoms and signs: Secondary | ICD-10-CM | POA: Diagnosis not present

## 2017-09-13 NOTE — Progress Notes (Signed)
Subjective:    Patient ID: Randy Sullivan is a 84 y.o. male.  HPI     Interim history:    Randy Sullivan is an 84 year old right-handed gentleman with an underlying medical history of hypothyroidism, hypertension, reflux disease, BPH, actinic keratosis, degenerative joint disease, seasonal allergies, tremors, sleep apnea, and PLMS, who presents for follow-up consultation of his seizure-like events. The patient is accompanied by his wife today. I last saw him on 04/06/2017 at which time we reviewed his recent hospitalizations for syncopal spells or spells of decreased attentiveness, possible seizure-like events. He had extensive workup in the hospital. I suggested he continue with his medication regimen.  Today, 09/13/2017: He reports feeling stable. Has been off [place] for the past 2 months or so per wife's report. No obvious repercussions. He was seen in the emergency room on 04/15/2017 with a episode of collapse or unresponsiveness at the house. Last head CT from 04/15/2017 showed: IMPRESSION: No acute abnormality. Atrophy and chronic microvascular ischemic changes, stable from prior imaging  Last appointment with cardiology was January 2019, a loop recorder was mentioned. He has outpatient physical therapy currently.   The patient's allergies, current medications, family history, past medical history, past social history, past surgical history and problem list were reviewed and updated as appropriate.   Previously (copied from previous notes for reference):   I have previously seen for his sleep disturbance and PLMD. I saw him in April 2018, at which time he was doing reasonably well on gabapentin at night. I suggested as needed follow-up. He was followed by pulmonology for his obstructive sleep apnea.   Of note, he was hospitalized in November from 01/13/2017 through 01/15/2017 after two spells of unresponsiveness at the house. He was transported via EMS to the hospital. Workup in the  hospital setting included echocardiogram which was negative for any sinister changes, EF was reportedly normal, carotid Doppler study without significant stenosis. Final Interpretation: Right Carotid: There is evidence in the right ICA of a 1-39% stenosis. Left Carotid: There is evidence in the left ICA of a 1-39% stenosis. Vertebrals: Both vertebral arteries were patent with antegrade flow.   He was readmitted to the hospital on 01/18/2017 and discharged on 01/21/2017 with concern for recurrent syncopal spell versus seizure activity. He was not witnessed to have any seizure activity as in convulsion or twitching, but had a witnessed speech arrest. EEG on 01/18/2017 was nonspecific showing FIRDA, brain MRI without contrast and MRA head without contrast on 01/19/2017 showed: IMPRESSION: Brain MRI:   1. Truncated study due to patient request. 2. No acute finding including infarct. 3. Moderate atrophy. 4. Chronic small vessel ischemia including remote lacunar infarct in the right thalamus.   Intracranial MRA:   Mild atheromatous changes. No acute finding or flow limiting stenosis.   He was suspected to have likely syncopal spells but was started on low-dose Keppra on a suspicion of possible seizure disorder. He was suspected to have memory loss and outpatient neuropsychological evaluation was recommended as well.   He reports feeling better, no specific complaints, but sleepy during the day. He has some stressors currently including his wife's cancer diagnosis and having had her gallbladder removed, he had left total knee arthroplasty in September 2018. He believes that he needs his right knee replaced. He has not had any more episodes of collapsing. He admits that he may not drink enough water, does not know how much she actually drinks water, he does report that his wife is typically on  his case for drinking more water. His wife is a retired Marine scientist and his friend Randy Sullivan's wife also was a Marine scientist and  they both work together. They do not have any children but have some help at the house through a home health agency. He reports still taking the medication that was given to him through the hospital, Keppra, 500 mg twice daily but is not sure how many refills he has. He may have received refill through your office he believes.    I reviewed your office note from 01/24/2017.   I saw him on 12/18/2015, at which time he reported doing better. He was less restless, was taking gabapentin 100 mg at night. He had some knee pain bilaterally and ankle pain. He was complaining of nocturia, he was followed by urology and also had treatment for his bladder hyperactivity. I suggested he try to increase his gabapentin to 200 mg each night. I encouraged him to start using a cane.    I saw him on 08/11/2015, at which time we talked about his sleep study results from 07/30/2015, indicating mild obstructive sleep apnea and severe PLMS. I suggested symptomatic treatment for his PLMD with gabapentin low-dose. We also talked about potentially trying CPAP therapy for his overall mild sleep apnea with an AHI of 7.5 per hour, worse in supine sleep. His wife had noted leg twitching in the past 2 or 3 months. His nocturia was about the same. He was on home oxygen. He denies restless leg symptoms but did report discomfort in his left knee and left ankle.    I first met him on 07/09/2015 at the request of his primary care physician, at which time the patient reported snoring, nonrestorative sleep, significant nocturia and daytime tiredness. He had recently undergone an overnight pulse oximetry test of which the results were pending at the time. In the interim, I reviewed his test results on 07/29/2015: Pulse oximetry overnight was done on 07/04/2015. Average oxygen saturation was 94.7%, total test time 8 hours and 9 minutes. Lowest oxygen saturation 85%, time below 88% saturation of 4.6 minutes. He was placed on oxygen at night by his  primary care physician. He had a baseline sleep study on 07/30/2015: Sleep efficiency was 47.4%, latency to sleep 114.5 minutes, wake after sleep onset was 141.5 minutes with several longer periods of wakefulness and 6 restaurant breaks. The patient wanted to contact the sleep study with his home oxygen level of 2 L/m and therefore the study was done with his supplemental oxygen. He was noted to have severe PLMS. Index was 114.5 per hour, resulting in only 2.3 arousals per hour. He had rare PVCs on EKG. He had an increased percentage of stage II sleep, slow-wave sleep was 10.2%, REM sleep was 14.3%, REM latency was prolonged at 138 minutes. He had minimal snoring. Total AHI was 7.5 per hour. Average oxygen saturation was 97%, nadir was 94%, again with supplemental oxygen at 2 L/m.   07/09/2015: He reports snoring, daytime tiredness, nonrestorative sleep nocturia (3-4 x/night). I reviewed your office note from 06/25/2015. He quit smoking many years ago, over 30 years ago. He drinks alcohol occasionally, he does not use illicit drugs, he drinks non-caffeine coffee. Per wife, he is sleepy during the day. His Epworth sleepiness score is 12 out of 24 today, his fatigue score is 39/63. He is a retired Engineer, structural. His first wife passed away in 2004-05-24. He remarried in 05/25/07.   He is no longer on Proscar or Flomax,  he had side effects from them, including feeling off balance, he is currently on Myrbetriq.   He takes low-dose propanolol, 10 mg twice daily for his tremors which has been helpful. He has had some weight loss and loss of appetite in the past weeks to months. He had recent blood work in your office, CMP and CBC. You also ordered a CT head without contrast. He was complaining of memory loss. He had a head CT without contrast on 07/02/2015: IMPRESSION: No acute abnormality noted.   He also had a recent nocturnal pulse oximetry test through your office, results are pending, this test was done just 2 nights  ago. We will request test results from your office.   In addition, I personally reviewed the images through the PACS system.   He complains of lack of energy and the need to lay down to rest and he may doze off.   He has aching in the L knee and he twitches his feet in sleep, per wife. He sees Dr. Maureen Ralphs for this has had injections to the L knee, including Synvisc.  Sometimes he has has bifrontal head pressure in the mornings.   He does not have a sleep apnea family history. He has an older sister, she is not very well, has not been diagnosed with sleep apnea. He has no children.  His Past Medical History Is Significant For: Past Medical History:  Diagnosis Date  . BPH (benign prostatic hyperplasia)   . Cancer ([place])    non melanoma skin cancers removed  . DJD (degenerative joint disease)   . Familial tremor   . GERD (gastroesophageal reflux disease)   . Heart murmur    hx of  . Hyperlipidemia   . Hypertension   . Hypothyroidism   . Mood disorder ([place])   . Osteoarthritis   . Prostatism   . Shortness of breath    mild with excertion  . Sleep apnea    cpap  . Syncope 01/13/2017  . Thyroid disease     His Past Surgical History Is Significant For: Past Surgical History:  Procedure Laterality Date  . APPENDECTOMY    . EYE SURGERY     bil cataracts  . HEMORROIDECTOMY    . INNER EAR SURGERY Right    With impant.   . TONSILLECTOMY    . TOTAL KNEE ARTHROPLASTY Left 11/08/2016   Procedure: LEFT TOTAL KNEE ARTHROPLASTY;  Surgeon: Gaynelle Arabian, MD;  Location: [facility];  Service: Orthopedics;  Laterality: Left;  Adductor Block    His Family History Is Significant For: Family History  Problem Relation Age of Onset  . Heart attack Father   . Colon cancer Mother   . Colon cancer Sister     His Social History Is Significant For: Social History   Socioeconomic History  . Marital status: Married    Spouse name: Not on file  . Number of children: Not on file  . Years of  education: Not on file  . Highest education level: Not on file  Occupational History  . Occupation: retired  Scientific laboratory technician  . Financial resource strain: Not on file  . Food insecurity:    Worry: Not on file    Inability: Not on file  . Transportation needs:    Medical: Not on file    Non-medical: Not on file  Tobacco Use  . Smoking status: Former Smoker    Packs/day: 2.00    Years: 18.00    Pack years: 36.00  Types: Cigarettes    Last attempt to quit: 06/04/1970    Years since quitting: 47.3  . Smokeless tobacco: Never Used  Substance and Sexual Activity  . Alcohol use: Yes    Alcohol/week: 0.0 oz    Comment: special occasions  . Drug use: No  . Sexual activity: Not Currently  Lifestyle  . Physical activity:    Days per week: Not on file    Minutes per session: Not on file  . Stress: Not on file  Relationships  . Social connections:    Talks on phone: Not on file    Gets together: Not on file    Attends religious service: Not on file    Active member of club or organization: Not on file    Attends meetings of clubs or organizations: Not on file    Relationship status: Not on file  Other Topics Concern  . Not on file  Social History Narrative   Rare caffeine use     His Allergies Are:  Allergies  Allergen Reactions  . Tizanidine Other (See Comments)    Hypotension   :   His Current Medications Are:  Outpatient Encounter Medications as of 09/13/2017  Medication Sig  . aspirin 81 MG tablet Take 81 mg by mouth daily.  . Cholecalciferol (VITAMIN D PO) Take by mouth.  . Cyanocobalamin (VITAMIN B 12 PO) Take by mouth.  . gabapentin (NEURONTIN) 100 MG capsule Take 100 mg by mouth at bedtime.  . iron polysaccharides (NIFEREX) 150 MG capsule Take 150 mg by mouth daily.  Marland Kitchen levETIRAcetam (KEPPRA) 500 MG tablet Take 1 tablet (500 mg total) by mouth 2 (two) times daily.  Marland Kitchen levothyroxine (SYNTHROID, LEVOTHROID) 112 MCG tablet Take 112 mcg by mouth daily before breakfast.    . loratadine (CLARITIN) 10 MG tablet Take 10 mg by mouth daily as needed for allergies.   . megestrol (MEGACE) 20 MG tablet Take 20-40 mg by mouth See admin instructions. Take 2 tablets ('40mg'$ ) before breakfast and 1 tablet ('20mg'$ ) before supper.  . montelukast (SINGULAIR) 10 MG tablet TAKE ONE TABLET AT BEDTIME.  . MYRBETRIQ 50 MG TB24 tablet Take 50 mg by mouth daily.   Marland Kitchen omeprazole (PRILOSEC) 20 MG capsule Take 20 mg by mouth 2 (two) times daily before a meal.   . Polyethyl Glycol-Propyl Glycol (SYSTANE OP) Apply 1-2 drops to eye daily as needed (dry eyes).   . propranolol (INDERAL) 20 MG tablet Take 20 mg by mouth daily.   . rosuvastatin (CRESTOR) 5 MG tablet Take 5 mg by mouth at bedtime.   . [DISCONTINUED] aspirin 81 MG chewable tablet Chew 1 tablet (81 mg total) by mouth daily.   No facility-administered encounter medications on file as of 09/13/2017.   :  Review of Systems:  Out of a complete 14 point review of systems, all are reviewed and negative with the exception of these symptoms as listed below:  Review of Systems  Neurological:       Patient returning to the clinic today to discuss his neuro care. Patient has not been taking his keppra for a couple of months and reports no seizures.     Objective:  Neurological Exam  Physical Exam Physical Examination:   Vitals:   09/13/17 1451  BP: (!) 151/83  Pulse: 68   General Examination: The patient is a very pleasant 84 y.o. male in no acute distress. He appears well-developed and well-nourished and well groomed.   HEENT:Normocephalic, atraumatic, pupils are equal,  round and reactive to light and accommodation. Extraocular tracking is good without limitation to gaze excursion or nystagmus noted. Normal smooth pursuit is noted. Hearing is grossly intact. Face is symmetric with normal facial animation and normal facial sensation. Speech is clear with no dysarthria noted. There is no hypophonia. There is no lip, neck/head, jaw  tremor, slight voice tremor.Oropharynx exam reveals: mild to moderatemouth dryness, adequatedental hygiene. Tongue protrudes centrally and palate elevates symmetrically.   Chest:Clear to auscultation without wheezing, rhonchi or crackles noted.  Heart:S1+S2+0, regular and normal without murmurs, rubs or gallops noted.   Abdomen:Soft, non-tender and non-distended with normal bowel sounds appreciated on auscultation.  Extremities:There isnopitting edema in the distal lower extremities bilaterally.  Skin: Warm and dry without trophic changes noted.  Musculoskeletal: exam reveals mild right knee swelling and discomfort, is s/p L knee surgery.   Neurologically:  Mental status: The patient is awake, alert and oriented in all 4 spheres.Hisimmediate and remote memory are more impaired and he is slower in thinking. Mood is normaland affect is normal.  Cranial nerves II - XII are as described above under HEENT exam.   Motor exam: Normal bulk, strength normal for age, and normal tone is noted. There is no drift,resting tremor. Romberg is not tested for safety.He has a mild and stable tremor. Fine motor skills are globally impaired. He stands up with difficulty and uses his walker. Sensory exam is intact to light touch, reflexes are 1+ in the upper extremities, trace in both knees, absent in both ankles.  Assessmentand Plan:   In summary,Nosson F Murphyis a very pleasant 39 year oldmalewith an underlying medical history of hypothyroidism, hypertension, reflux disease, BPH, actinic keratosis, degenerative joint disease, particularly of the knees, with status post left total knee replacement in September 2018, tremor, seasonal allergies, left ankle fracture, whopresents for follow-up consultation of his history of spells of decreased attentiveness,/syncopal spells. He is currently no longer on Keppra for the past 2 months or so. He has had no recent spell. I suggested that we  repeat an EEG at this time. We will call him with the test results. He had workup in the hospital extensively at the end of last year.  He has been seen by cardiology as well. A loop recorder was mentioned in January 2019, I encouraged him to discuss this again with the cardiologist. He is an outpatient physical therapy. I suggested he can stay off of [place] for now and we will call him with the EEG results, he should continue with physical therapy. I will see him back as needed. I answered all their questions today and the patient and his wife were in agreement. I spent 25 minutes in total face-to-face time with the patient, more than 50% of which was spent in counseling and coordination of care, reviewing test results, reviewing medication and discussing or reviewing the diagnosis of spells, the prognosis and treatment options. Pertinent laboratory and imaging test results that were available during this visit with the patient were reviewed by me and considered in my medical decision making (see chart for details).

## 2017-09-13 NOTE — Patient Instructions (Addendum)
Please continue with physical therapy. I am okay with you staying off the [place].  We will do an EEG (brainwave test), which we will schedule. We will call you with the results. I would recommend that you consider the loop recorder, Dr. Loletha Grayer mentioned in January for possibility of low heart rate which may cause loss of consciousness.

## 2017-09-16 DIAGNOSIS — R350 Frequency of micturition: Secondary | ICD-10-CM | POA: Diagnosis not present

## 2017-09-16 DIAGNOSIS — M1711 Unilateral primary osteoarthritis, right knee: Secondary | ICD-10-CM | POA: Diagnosis not present

## 2017-09-16 DIAGNOSIS — N3941 Urge incontinence: Secondary | ICD-10-CM | POA: Diagnosis not present

## 2017-09-19 DIAGNOSIS — M1711 Unilateral primary osteoarthritis, right knee: Secondary | ICD-10-CM | POA: Diagnosis not present

## 2017-09-20 ENCOUNTER — Ambulatory Visit (INDEPENDENT_AMBULATORY_CARE_PROVIDER_SITE_OTHER): Payer: MEDICARE | Admitting: Neurology

## 2017-09-20 DIAGNOSIS — R6889 Other general symptoms and signs: Secondary | ICD-10-CM

## 2017-09-20 DIAGNOSIS — R55 Syncope and collapse: Secondary | ICD-10-CM | POA: Diagnosis not present

## 2017-09-20 NOTE — Procedures (Signed)
    History:  Randy Sullivan is an 84 year old gentleman with a history of sleep apnea, he has had seizure-like events that are being evaluated.  The patient has had syncope or episodes of decreased attentiveness.  The patient is being evaluated for possible seizures.  This is a routine EEG.  No skull defects are noted.  Medications include aspirin, vitamin D, vitamin B12, gabapentin, iron supplementation, Keppra, Synthroid, Megace, Singulair, Myrbetriq, Prilosec, Inderal, Crestor, and Claritin  EEG classification: Normal awake  Description of the recording: The background rhythms of this recording consists of a fairly well modulated medium amplitude alpha rhythm of 9 Hz that is reactive to eye opening and closure. As the record progresses, the patient appears to remain in the waking state throughout the recording. Photic stimulation was performed, resulting in a bilateral and symmetric photic driving response. Hyperventilation was not performed. At no time during the recording does there appear to be evidence of spike or spike wave discharges or evidence of focal slowing. EKG monitor shows no evidence of cardiac rhythm abnormalities with a heart rate of 72.  Impression: This is a normal EEG recording in the waking state. No evidence of ictal or interictal discharges are seen.

## 2017-09-21 ENCOUNTER — Telehealth: Payer: Self-pay | Admitting: Cardiovascular Disease

## 2017-09-21 ENCOUNTER — Telehealth: Payer: Self-pay

## 2017-09-21 DIAGNOSIS — R55 Syncope and collapse: Secondary | ICD-10-CM

## 2017-09-21 NOTE — Telephone Encounter (Signed)
[name], [name], per DPR, returned my call. I advised her of pt's normal EEG results. [name] verbalized understanding. Pt will follow up with his primary care physician. [name] had no questions at this time.

## 2017-09-21 NOTE — Telephone Encounter (Signed)
I called pt, spoke with Danton Clap, a caregiver, asked her to ask pt to call us back.

## 2017-09-21 NOTE — Progress Notes (Signed)
Please call and advise the patient that the EEG or brain wave test we performed was reported as normal in the awake state. We checked for abnormal electrical discharges in the brain waves and the report suggested normal findings. No further action is required on this test at this time. He can FU with PCP.   Star Age, MD, PhD

## 2017-09-21 NOTE — Telephone Encounter (Signed)
New Message:       [name] is calling and states the pt is ready to go ahead with getting a loop recorder.

## 2017-09-21 NOTE — Telephone Encounter (Signed)
-----   Message from Star Age, MD sent at 09/21/2017  7:38 AM EDT ----- Please call and advise the patient that the EEG or brain wave test we performed was reported as normal in the awake state. We checked for abnormal electrical discharges in the brain waves and the report suggested normal findings. No further action is required on this test at this time. He can FU with PCP.   Star Age, MD, PhD

## 2017-09-21 NOTE — Telephone Encounter (Signed)
Returned call to wife. She states patient's EEG was OK per Dr. Rexene Alberts so they think they should proceed with loop recorder. Explained MD is not available to do procedure for a few weeks and that I would have his [place] review provider schedule to determine when this can be done. Patient has no plans in August and is flexible.   Does he need another office visit since last appt was >6 months ago?  Per last MD note in Jan 2019:  1. Syncope: The exact cause was episodes of unresponsiveness has not been definitively established, but he seems to have had a good response to antiepileptic medications.  He may have been having absence seizures.  Talked to Mr. and Mrs. Funke about further cardiology workup and we have decided to defer this unless new episodes occur while he is on the anticonvulsant.  I would not have him wear a 30-day event monitor but could go directly to implantation of a loop recorder which will have the best yield as far as the possibility of arrhythmic syncope.  Did not have evidence of chronotropic incompetence on treadmill testing. On the contrary, heart rate response was exaggerated suggesting deconditioning.

## 2017-09-23 DIAGNOSIS — M1711 Unilateral primary osteoarthritis, right knee: Secondary | ICD-10-CM | POA: Diagnosis not present

## 2017-09-30 DIAGNOSIS — M1711 Unilateral primary osteoarthritis, right knee: Secondary | ICD-10-CM | POA: Diagnosis not present

## 2017-10-03 NOTE — Telephone Encounter (Signed)
I will be glad to do his loop recorder this Thursday or this Friday and I can update his H and P on that day. MCr

## 2017-10-04 NOTE — Telephone Encounter (Signed)
Spoke with patient's wife and explained that MD is available to do loop recorder insertion procedure Thursday or Friday of this week. She states he is sleeping right now but knows he wishes to proceed. She states Thursday works best for them.   Called [facility] cath lab and spoke with Santiago Glad. Scheduled patient for 10/06/17 @ 730 with Dr. Sallyanne Kuster with arrival time of 630. Staff message has been sent to pre-cert to authorize this procedure.   Patient's wife called and notified of procedure date/time/location. She will come by office to pick up printed instructions & surgical scrub. Asked that she review instructions before leaving office in case she has questions. She voiced understanding.

## 2017-10-04 NOTE — Telephone Encounter (Signed)
Thanks MCr 

## 2017-10-05 DIAGNOSIS — M1711 Unilateral primary osteoarthritis, right knee: Secondary | ICD-10-CM | POA: Diagnosis not present

## 2017-10-06 ENCOUNTER — Encounter (HOSPITAL_COMMUNITY): Payer: Self-pay | Admitting: Cardiovascular Disease

## 2017-10-06 ENCOUNTER — Ambulatory Visit (HOSPITAL_COMMUNITY)
Admission: RE | Admit: 2017-10-06 | Discharge: 2017-10-06 | Disposition: A | Discharge status: Home / Self Care | Payer: MEDICARE | Source: Ambulatory Visit | Attending: Cardiovascular Disease | Admitting: Cardiovascular Disease

## 2017-10-06 ENCOUNTER — Encounter (HOSPITAL_COMMUNITY)
Admission: RE | Disposition: A | Discharge status: Home / Self Care | Payer: Self-pay | Source: Ambulatory Visit | Attending: Cardiovascular Disease

## 2017-10-06 DIAGNOSIS — R55 Syncope and collapse: Secondary | ICD-10-CM | POA: Diagnosis not present

## 2017-10-06 DIAGNOSIS — Z87891 Personal history of nicotine dependence: Secondary | ICD-10-CM | POA: Diagnosis not present

## 2017-10-06 DIAGNOSIS — G4730 Sleep apnea, unspecified: Secondary | ICD-10-CM | POA: Insufficient documentation

## 2017-10-06 DIAGNOSIS — Z79899 Other long term (current) drug therapy: Secondary | ICD-10-CM | POA: Diagnosis not present

## 2017-10-06 DIAGNOSIS — Z9889 Other specified postprocedural states: Secondary | ICD-10-CM | POA: Diagnosis not present

## 2017-10-06 DIAGNOSIS — G250 Essential tremor: Secondary | ICD-10-CM | POA: Insufficient documentation

## 2017-10-06 DIAGNOSIS — E039 Hypothyroidism, unspecified: Secondary | ICD-10-CM | POA: Diagnosis not present

## 2017-10-06 DIAGNOSIS — Z7982 Long term (current) use of aspirin: Secondary | ICD-10-CM | POA: Insufficient documentation

## 2017-10-06 DIAGNOSIS — Z800 Family history of malignant neoplasm of digestive organs: Secondary | ICD-10-CM | POA: Diagnosis not present

## 2017-10-06 DIAGNOSIS — Z8249 Family history of ischemic heart disease and other diseases of the circulatory system: Secondary | ICD-10-CM | POA: Insufficient documentation

## 2017-10-06 DIAGNOSIS — Z79890 Hormone replacement therapy (postmenopausal): Secondary | ICD-10-CM | POA: Insufficient documentation

## 2017-10-06 DIAGNOSIS — Z85820 Personal history of malignant melanoma of skin: Secondary | ICD-10-CM | POA: Diagnosis not present

## 2017-10-06 DIAGNOSIS — Z96652 Presence of left artificial knee joint: Secondary | ICD-10-CM | POA: Diagnosis not present

## 2017-10-06 DIAGNOSIS — N400 Enlarged prostate without lower urinary tract symptoms: Secondary | ICD-10-CM | POA: Diagnosis not present

## 2017-10-06 DIAGNOSIS — I10 Essential (primary) hypertension: Secondary | ICD-10-CM | POA: Diagnosis not present

## 2017-10-06 DIAGNOSIS — E785 Hyperlipidemia, unspecified: Secondary | ICD-10-CM | POA: Diagnosis not present

## 2017-10-06 DIAGNOSIS — Z888 Allergy status to other drugs, medicaments and biological substances status: Secondary | ICD-10-CM | POA: Insufficient documentation

## 2017-10-06 DIAGNOSIS — K219 Gastro-esophageal reflux disease without esophagitis: Secondary | ICD-10-CM | POA: Insufficient documentation

## 2017-10-06 DIAGNOSIS — M1990 Unspecified osteoarthritis, unspecified site: Secondary | ICD-10-CM | POA: Diagnosis not present

## 2017-10-06 HISTORY — PX: LOOP RECORDER INSERTION: EP1214

## 2017-10-06 SURGERY — LOOP RECORDER INSERTION

## 2017-10-06 MED ORDER — LIDOCAINE-EPINEPHRINE 1 %-1:100000 IJ SOLN
INTRAMUSCULAR | Status: DC | PRN
  Administered 2017-10-06: 20 mL

## 2017-10-06 MED ORDER — LIDOCAINE-EPINEPHRINE 1 %-1:100000 IJ SOLN
INTRAMUSCULAR | Status: AC
  Filled 2017-10-06: qty 1

## 2017-10-06 SURGICAL SUPPLY — 2 items
LOOP REVEAL LINQSYS (Prosthesis & Implant Heart) ×3 IMPLANT
PACK LOOP INSERTION (CUSTOM PROCEDURE TRAY) ×3 IMPLANT

## 2017-10-06 NOTE — H&P (Signed)
Cardiology Admission History and Physical:   Patient ID: Randy Sullivan; MRN: [id number]; DOB: Jun 10, 1928   Admission date: 10/06/2017  Primary Care Provider: Prince Solian, MD Primary Cardiologist: Sanda Klein, MD  Primary Electrophysiologist:  n/a  Chief Complaint:  syncope  Patient Profile:   Randy Sullivan is a 84 y.o. male with a history of recurrent episodes of unresponsiveness of uncertain etiology.   History of Present Illness:   [name] has had numerous episodes of reduced responsiveness.  The episodes usually occur at rest when he is sitting in a chair.  At least 1 of them happened while sitting on the commode.  One episode occurred in the hospital when he was reportedly on telemetry and no rhythm abnormality was documented, although that is not clearly recorded neurological work-up has been negative.  His echocardiogram is normal.  External rhythm monitoring and treadmill testing did not show any meaningful abnormalities.  Brain MRI and EEG have been unrevealing.  He is here for loop recorder implantation.  Past Medical History:  Diagnosis Date  . BPH (benign prostatic hyperplasia)   . Cancer ([place])    non melanoma skin cancers removed  . DJD (degenerative joint disease)   . Familial tremor   . GERD (gastroesophageal reflux disease)   . Heart murmur    hx of  . Hyperlipidemia   . Hypertension   . Hypothyroidism   . Mood disorder ([place])   . Osteoarthritis   . Prostatism   . Shortness of breath    mild with excertion  . Sleep apnea    cpap  . Syncope 01/13/2017  . Thyroid disease     Past Surgical History:  Procedure Laterality Date  . APPENDECTOMY    . EYE SURGERY     bil cataracts  . HEMORROIDECTOMY    . INNER EAR SURGERY Right    With impant.   . TONSILLECTOMY    . TOTAL KNEE ARTHROPLASTY Left 11/08/2016   Procedure: LEFT TOTAL KNEE ARTHROPLASTY;  Surgeon: Gaynelle Arabian, MD;  Location: [facility];  Service: Orthopedics;  Laterality: Left;   Adductor Block     Medications Prior to Admission: Prior to Admission medications   Medication Sig Start Date End Date Taking? Authorizing Provider  acetaminophen (TYLENOL) 500 MG tablet Take 1,000 mg by mouth every 6 (six) hours as needed for moderate pain or headache.   Yes [provider]  aspirin 81 MG tablet Take 81 mg by mouth daily.   Yes [provider]  cholecalciferol (VITAMIN D) 1000 units tablet Take 1,000 Units by mouth daily.    Yes [provider]  Cyanocobalamin (HM SUPER VITAMIN B12) 2500 MCG CHEW Chew 2,500 mcg by mouth daily.   Yes [provider]  fesoterodine (TOVIAZ) 4 MG TB24 tablet Take 4 mg by mouth daily.   Yes [provider]  gabapentin (NEURONTIN) 100 MG capsule Take 100 mg by mouth at bedtime. 10/28/16  Yes [provider]  iron polysaccharides (NIFEREX) 150 MG capsule Take 150 mg by mouth every Tuesday.    Yes [provider]  levETIRAcetam (KEPPRA) 500 MG tablet Take 1 tablet (500 mg total) by mouth 2 (two) times daily. Patient taking differently: Take 500 mg by mouth daily.  01/20/17  Yes Debbe Odea, MD  levothyroxine (SYNTHROID, LEVOTHROID) 112 MCG tablet Take 112 mcg by mouth daily before breakfast.  10/14/14  Yes [provider]  loratadine (CLARITIN) 10 MG tablet Take 10 mg by mouth daily.  Yes [provider]  megestrol (MEGACE) 20 MG tablet Take 40 mg by mouth daily.    Yes [provider]  montelukast (SINGULAIR) 10 MG tablet TAKE ONE TABLET AT BEDTIME. Patient taking differently: Take 10 mg by mouth at bedtime.  09/05/17  Yes Parrett, Fonnie Mu, NP  Polyethyl Glycol-Propyl Glycol (SYSTANE OP) Place 1-2 drops into both eyes daily as needed (for dry eyes).    Yes [provider]  propranolol (INDERAL) 20 MG tablet Take 20 mg by mouth daily.  06/18/16  Yes [provider]  rosuvastatin (CRESTOR) 5 MG tablet Take 5 mg by mouth at bedtime.  06/23/16  Yes  [provider]  saccharomyces boulardii (FLORASTOR) 250 MG capsule Take 250 mg by mouth daily.   Yes [provider]     Allergies:    Allergies  Allergen Reactions  . Tizanidine Other (See Comments)    Hypotension     Social History:   Social History   Socioeconomic History  . Marital status: Married    Spouse name: Not on file  . Number of children: Not on file  . Years of education: Not on file  . Highest education level: Not on file  Occupational History  . Occupation: retired  Scientific laboratory technician  . Financial resource strain: Not on file  . Food insecurity:    Worry: Not on file    Inability: Not on file  . Transportation needs:    Medical: Not on file    Non-medical: Not on file  Tobacco Use  . Smoking status: Former Smoker    Packs/day: 2.00    Years: 18.00    Pack years: 36.00    Types: Cigarettes    Last attempt to quit: 06/04/1970    Years since quitting: 47.3  . Smokeless tobacco: Never Used  Substance and Sexual Activity  . Alcohol use: Yes    Alcohol/week: 0.0 standard drinks    Comment: special occasions  . Drug use: No  . Sexual activity: Not Currently  Lifestyle  . Physical activity:    Days per week: Not on file    Minutes per session: Not on file  . Stress: Not on file  Relationships  . Social connections:    Talks on phone: Not on file    Gets together: Not on file    Attends religious service: Not on file    Active member of club or organization: Not on file    Attends meetings of clubs or organizations: Not on file    Relationship status: Not on file  . Intimate partner violence:    Fear of current or ex partner: Not on file    Emotionally abused: Not on file    Physically abused: Not on file    Forced sexual activity: Not on file  Other Topics Concern  . Not on file  Social History Narrative   Rare caffeine use     Family History:   The patient's family history includes Colon cancer in his mother and sister; Heart  attack in his father.    ROS:  Please see the history of present illness.  All other ROS reviewed and negative.     Physical Exam/Data:   Vitals:   10/06/17 0658  BP: (!) 147/83  Pulse: 84  Temp: 98 F (36.7 C)  TempSrc: Oral  SpO2: 98%  Weight: 78 kg  Height: 5\' 11"  (1.803 m)   No intake or output data in the 24 hours  ending 10/06/17 0818 Filed Weights   10/06/17 0658  Weight: 78 kg   Body mass index is 23.99 kg/m.  General:  Well nourished, well developed, in no acute distress HEENT: normal Lymph: no adenopathy Neck: no JVD Endocrine:  No thryomegaly Vascular: No carotid bruits; FA pulses 2+ bilaterally without bruits  Cardiac:  normal S1, S2; RRR; no murmur  Lungs:  clear to auscultation bilaterally, no wheezing, rhonchi or rales  Abd: soft, nontender, no hepatomegaly  Ext: no edema Musculoskeletal:  No deformities, BUE and BLE strength normal and equal Skin: warm and dry  Neuro:  CNs 2-12 intact, no focal abnormalities noted Psych:  Normal affect    EKG:  The ECG  was personally reviewed and demonstrates sinus rhythm and mild QT interval prolongation  Relevant CV Studies: Reviewed echo, multiple electrocardiograms, treadmill stress test, carotid duplex ultrasound, neurology work-up.  Radiology/Studies:  No results found.  Assessment and Plan:   Loop recorder implantation is appropriate.This procedure has been fully reviewed with the patient and written informed consent has been obtained.   For questions or updates, please contact [facility] Please consult [email or web address] for contact info under Cardiology/STEMI.    Signed, Sanda Klein, MD  10/06/2017 8:18 AM

## 2017-10-06 NOTE — Discharge Instructions (Signed)
See extra paper with dc instructions// wife given copy

## 2017-10-06 NOTE — Op Note (Signed)
LOOP RECORDER IMPLANT   Procedure report  Procedure performed:  Loop recorder implantation   Reason for procedure:  Recurrent syncope/near-syncope  Procedure performed by:  Sanda Klein, MD  Complications:  None  Estimated blood loss:  <5 mL  Medications administered during procedure:  Lidocaine 1% with 1/10,000 epinephrine 10 mL locally Device details:  Medtronic Reveal Linq model number [id number], serial number [id number] Procedure details:  After the risks and benefits of the procedure were discussed the patient provided informed consent. The patient was prepped and draped in usual sterile fashion. Local anesthesia was administered to an area 2 cm to the left of the sternum in the 4th intercostal space. A cutaneous incision was made using the incision tool. The introducer was then used to create a subcutaneous tunnel and carefully deploy the device. Local pressure was held to ensure hemostasis.  The incision was closed with SteriStrips and a sterile dressing was applied.  R waves 0.40 mV.  Sanda Klein, MD, [place] [phone] office [phone] pager 10/06/2017 8:27 AM

## 2017-10-07 DIAGNOSIS — N3941 Urge incontinence: Secondary | ICD-10-CM | POA: Diagnosis not present

## 2017-10-07 DIAGNOSIS — M1711 Unilateral primary osteoarthritis, right knee: Secondary | ICD-10-CM | POA: Diagnosis not present

## 2017-10-11 DIAGNOSIS — M1711 Unilateral primary osteoarthritis, right knee: Secondary | ICD-10-CM | POA: Diagnosis not present

## 2017-10-12 ENCOUNTER — Telehealth: Payer: Self-pay | Admitting: Cardiology

## 2017-10-12 NOTE — Telephone Encounter (Signed)
Spoke w/ pt wife and requested that he send a manual transmission b/c his home monitor has not updated in at least 14 days. She stated that she has hooked it up but she believes she doesn't have adequate cell signal b/c there is only 2 bars. Pt doesn't have Internet. Instructed pt wife to bring home monitor with them to the Wound check appt on 10-18-2017. Pt wife verbalized understanding.

## 2017-10-14 DIAGNOSIS — M1711 Unilateral primary osteoarthritis, right knee: Secondary | ICD-10-CM | POA: Diagnosis not present

## 2017-10-17 DIAGNOSIS — M1711 Unilateral primary osteoarthritis, right knee: Secondary | ICD-10-CM | POA: Diagnosis not present

## 2017-10-18 ENCOUNTER — Ambulatory Visit (INDEPENDENT_AMBULATORY_CARE_PROVIDER_SITE_OTHER): Payer: MEDICARE | Admitting: *Deleted

## 2017-10-18 DIAGNOSIS — R55 Syncope and collapse: Secondary | ICD-10-CM

## 2017-10-18 LAB — CUP PACEART INCLINIC DEVICE CHECK
Implantable Pulse Generator Implant Date: 20190815
MDC IDC SESS DTM: 20190827202923

## 2017-10-18 NOTE — Patient Instructions (Signed)
Call the [facility] at [phone] if you use your symptom activator.

## 2017-10-18 NOTE — Progress Notes (Signed)
Wound check appointment. Steri-strips removed. Wound without redness or edema. Incision edges approximated, wound well healed. Normal device function. Battery status: good. R-waves 0.37mV. 1 symptom episode--from patient education during today's appointment. No tachy, pause, or brady episodes. <0.1% AF burden--no ECG due to duration <67min. Patient and wife educated about wound care, symptom activator, and Carelink monitor. Monthly summary reports and ROV with [facility] on 01/16/18.

## 2017-10-19 ENCOUNTER — Ambulatory Visit: Payer: MEDICARE | Admitting: Adult Health

## 2017-10-20 DIAGNOSIS — M1711 Unilateral primary osteoarthritis, right knee: Secondary | ICD-10-CM | POA: Diagnosis not present

## 2017-10-27 DIAGNOSIS — M1711 Unilateral primary osteoarthritis, right knee: Secondary | ICD-10-CM | POA: Diagnosis not present

## 2017-10-28 DIAGNOSIS — R350 Frequency of micturition: Secondary | ICD-10-CM | POA: Diagnosis not present

## 2017-10-28 DIAGNOSIS — N3941 Urge incontinence: Secondary | ICD-10-CM | POA: Diagnosis not present

## 2017-10-31 DIAGNOSIS — M1711 Unilateral primary osteoarthritis, right knee: Secondary | ICD-10-CM | POA: Diagnosis not present

## 2017-11-03 DIAGNOSIS — M1711 Unilateral primary osteoarthritis, right knee: Secondary | ICD-10-CM | POA: Diagnosis not present

## 2017-11-07 ENCOUNTER — Other Ambulatory Visit: Payer: Self-pay | Admitting: Adult Health

## 2017-11-07 DIAGNOSIS — M1711 Unilateral primary osteoarthritis, right knee: Secondary | ICD-10-CM | POA: Diagnosis not present

## 2017-11-09 ENCOUNTER — Encounter: Payer: MEDICARE | Admitting: *Deleted

## 2017-11-10 DIAGNOSIS — M1711 Unilateral primary osteoarthritis, right knee: Secondary | ICD-10-CM | POA: Diagnosis not present

## 2017-11-14 DIAGNOSIS — M1711 Unilateral primary osteoarthritis, right knee: Secondary | ICD-10-CM | POA: Diagnosis not present

## 2017-11-16 DIAGNOSIS — M1711 Unilateral primary osteoarthritis, right knee: Secondary | ICD-10-CM | POA: Diagnosis not present

## 2017-11-18 DIAGNOSIS — R350 Frequency of micturition: Secondary | ICD-10-CM | POA: Diagnosis not present

## 2017-11-18 DIAGNOSIS — N3941 Urge incontinence: Secondary | ICD-10-CM | POA: Diagnosis not present

## 2017-11-19 DIAGNOSIS — Z23 Encounter for immunization: Secondary | ICD-10-CM | POA: Diagnosis not present

## 2017-11-21 ENCOUNTER — Ambulatory Visit (INDEPENDENT_AMBULATORY_CARE_PROVIDER_SITE_OTHER): Payer: MEDICARE | Admitting: *Deleted

## 2017-11-21 DIAGNOSIS — R55 Syncope and collapse: Secondary | ICD-10-CM

## 2017-11-21 DIAGNOSIS — I639 Cerebral infarction, unspecified: Secondary | ICD-10-CM

## 2017-11-21 NOTE — Progress Notes (Signed)
Carelink Summary Report / Loop Recorder 

## 2017-11-22 LAB — CUP PACEART REMOTE DEVICE CHECK
Implantable Pulse Generator Implant Date: 20190815
MDC IDC SESS DTM: 20190929193609

## 2017-11-29 DIAGNOSIS — M1711 Unilateral primary osteoarthritis, right knee: Secondary | ICD-10-CM | POA: Diagnosis not present

## 2017-12-02 DIAGNOSIS — M1711 Unilateral primary osteoarthritis, right knee: Secondary | ICD-10-CM | POA: Diagnosis not present

## 2017-12-05 DIAGNOSIS — M1711 Unilateral primary osteoarthritis, right knee: Secondary | ICD-10-CM | POA: Diagnosis not present

## 2017-12-07 DIAGNOSIS — R413 Other amnesia: Secondary | ICD-10-CM | POA: Diagnosis not present

## 2017-12-07 DIAGNOSIS — I10 Essential (primary) hypertension: Secondary | ICD-10-CM | POA: Diagnosis not present

## 2017-12-07 DIAGNOSIS — R5383 Other fatigue: Secondary | ICD-10-CM | POA: Diagnosis not present

## 2017-12-07 DIAGNOSIS — R55 Syncope and collapse: Secondary | ICD-10-CM | POA: Diagnosis not present

## 2017-12-07 DIAGNOSIS — M25511 Pain in right shoulder: Secondary | ICD-10-CM | POA: Diagnosis not present

## 2017-12-07 DIAGNOSIS — R569 Unspecified convulsions: Secondary | ICD-10-CM | POA: Diagnosis not present

## 2017-12-07 DIAGNOSIS — D649 Anemia, unspecified: Secondary | ICD-10-CM | POA: Diagnosis not present

## 2017-12-07 DIAGNOSIS — Z6825 Body mass index (BMI) 25.0-25.9, adult: Secondary | ICD-10-CM | POA: Diagnosis not present

## 2017-12-08 ENCOUNTER — Telehealth: Payer: Self-pay | Admitting: Neurology

## 2017-12-08 DIAGNOSIS — M1711 Unilateral primary osteoarthritis, right knee: Secondary | ICD-10-CM | POA: Diagnosis not present

## 2017-12-08 NOTE — Telephone Encounter (Addendum)
I reviewed the most recent office by Dr. Rexene Alberts in July 2019, per record, patient's wife has stopped Keppra,  Keppra was given for  recurrent episode of sudden loss of consciousness, in November 2018, and again February 2019,  EEG was normal in July 2019,  Previous MRI of the brain, CT head showed small vessel disease,  Please wait for Dr. Rexene Alberts to make final decision about Keppra, keep current dose for now

## 2017-12-08 NOTE — Telephone Encounter (Signed)
Spoke to patient's wife - she has continued giving her husband Keppra 500mg  daily.  Per Dr. Rhea Belton instruction, I informed her to continue his Keppra at this dose until Dr. Rexene Alberts returns to the office and can address him stopping the medication.  She verbalized understanding and was in agreement with this plan.  She has medication available to her and did not require a refill at this time.  She is aware that Dr. Guadelupe Sabin return date to the office is 12/20/17.

## 2017-12-08 NOTE — Telephone Encounter (Signed)
Patient's wife called in and relayed that Dr. Rexene Alberts told her to stop levETIRAcetam (KEPPRA) 500 MG tablet in July .  Patient's wife has still been giving to him up until this point . Patient's wife last gave him Keppra Yesterday.  Please call her and tell her how to stop Keppra.  Call [name] at [phone]    Dr. Rexene Alberts is out of the office .

## 2017-12-09 DIAGNOSIS — R350 Frequency of micturition: Secondary | ICD-10-CM | POA: Diagnosis not present

## 2017-12-09 DIAGNOSIS — N3941 Urge incontinence: Secondary | ICD-10-CM | POA: Diagnosis not present

## 2017-12-12 DIAGNOSIS — M1711 Unilateral primary osteoarthritis, right knee: Secondary | ICD-10-CM | POA: Diagnosis not present

## 2017-12-15 DIAGNOSIS — M1711 Unilateral primary osteoarthritis, right knee: Secondary | ICD-10-CM | POA: Diagnosis not present

## 2017-12-19 DIAGNOSIS — M1711 Unilateral primary osteoarthritis, right knee: Secondary | ICD-10-CM | POA: Diagnosis not present

## 2017-12-19 NOTE — Telephone Encounter (Signed)
I called pt's wife, per DPR. I advised her that Dr. Rexene Alberts recommends that pt's keppa dose be cut in half for 3 days and then stop the keppra. [name] verbalized understanding of these recommendations.

## 2017-12-19 NOTE — Telephone Encounter (Signed)
At his last appointment in July 2019 his wife had told me that he had been off of [place] for 2 months. I told him that he could stay off of the [place]. I am not sure when she restarted giving it to him. She can stop the Keppra by tapering it: cut dose in half for 3 days then stop.

## 2017-12-20 DIAGNOSIS — M1711 Unilateral primary osteoarthritis, right knee: Secondary | ICD-10-CM | POA: Diagnosis not present

## 2017-12-22 DIAGNOSIS — M1711 Unilateral primary osteoarthritis, right knee: Secondary | ICD-10-CM | POA: Diagnosis not present

## 2017-12-23 ENCOUNTER — Ambulatory Visit (INDEPENDENT_AMBULATORY_CARE_PROVIDER_SITE_OTHER): Payer: MEDICARE | Admitting: *Deleted

## 2017-12-23 DIAGNOSIS — R55 Syncope and collapse: Secondary | ICD-10-CM | POA: Diagnosis not present

## 2017-12-25 NOTE — Progress Notes (Signed)
Carelink Summary Report / Loop Recorder 

## 2017-12-26 DIAGNOSIS — M1711 Unilateral primary osteoarthritis, right knee: Secondary | ICD-10-CM | POA: Diagnosis not present

## 2017-12-29 DIAGNOSIS — M1711 Unilateral primary osteoarthritis, right knee: Secondary | ICD-10-CM | POA: Diagnosis not present

## 2018-01-02 DIAGNOSIS — M1711 Unilateral primary osteoarthritis, right knee: Secondary | ICD-10-CM | POA: Diagnosis not present

## 2018-01-03 DIAGNOSIS — N3941 Urge incontinence: Secondary | ICD-10-CM | POA: Diagnosis not present

## 2018-01-03 DIAGNOSIS — R350 Frequency of micturition: Secondary | ICD-10-CM | POA: Diagnosis not present

## 2018-01-04 ENCOUNTER — Ambulatory Visit (INDEPENDENT_AMBULATORY_CARE_PROVIDER_SITE_OTHER): Payer: MEDICARE | Admitting: Adult Health

## 2018-01-04 ENCOUNTER — Encounter: Payer: Self-pay | Admitting: Adult Health

## 2018-01-04 DIAGNOSIS — I639 Cerebral infarction, unspecified: Secondary | ICD-10-CM

## 2018-01-04 DIAGNOSIS — G4733 Obstructive sleep apnea (adult) (pediatric): Secondary | ICD-10-CM | POA: Diagnosis not present

## 2018-01-04 DIAGNOSIS — J449 Chronic obstructive pulmonary disease, unspecified: Secondary | ICD-10-CM | POA: Diagnosis not present

## 2018-01-04 NOTE — Progress Notes (Signed)
@Patient  ID: Randy Sullivan, male    DOB: 1928-12-03, 84 y.o.   MRN: [id number]  Chief Complaint  Patient presents with  . Follow-up    COPD     Referring provider: Prince Solian, MD  HPI: 84 year old male followed for obstructive sleep apnea and COPD   TEST/EVENTS :  6/ 2017 NPSG AHI was 7.5, severe PLms but minimal arousals  11/2015 PFT  ratio 70,FEV1 2.63 90%, DLCO 58%  01/04/2018 Follow up : OSA , COPD  Patient presents for a six-month follow-up.  He has underlying mild COPD.  Patient says he has minimal symptoms with no shortness of breath at rest or even with activity.  Last visit Breo was discontinued.  Since stopping Breo he says he had no flare of his symptoms.  Patient has mild sleep apnea.  He was previously on CPAP but had difficulty tolerating.  He also has extreme nocturia due to enlarged prostate.  It was very difficult for him to wear CPAP.  Last visit he was recommend that he could stop this due to the increased stress and burden of CPAP.  Patient says since stopping he has had no change in his symptoms.  He does have some daytime sleepiness but says it is manageable.  Patient is currently working with physical therapy to increase his strength and balance.  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 Tizanidine Other (See Comments)    Hypotension     Immunization History  Administered Date(s) Administered  . Influenza, High Dose Seasonal PF 10/21/2016  . Influenza,inj,Quad PF,6+ Mos 10/23/2015  . Influenza,inj,quad, With Preservative 10/23/2016  . Influenza-Unspecified 11/14/2017  . Pneumococcal Conjugate-13 02/12/2014  . Pneumococcal Polysaccharide-23 02/12/2013  . Tdap 03/04/2010  . Zoster 08/20/2013    Past Medical History:  Diagnosis Date  . BPH (benign prostatic hyperplasia)   . Cancer ([place])    non melanoma skin cancers removed  . DJD (degenerative joint disease)   . Familial tremor   .  GERD (gastroesophageal reflux disease)   . Heart murmur    hx of  . Hyperlipidemia   . Hypertension   . Hypothyroidism   . Mood disorder ([place])   . Osteoarthritis   . Prostatism   . Shortness of breath    mild with excertion  . Sleep apnea    cpap  . Syncope 01/13/2017  . Thyroid disease     Tobacco History: Social History   Tobacco Use  Smoking Status Former Smoker  . Packs/day: 2.00  . Years: 18.00  . Pack years: 36.00  . Types: Cigarettes  . Last attempt to quit: 06/04/1970  . Years since quitting: 47.6  Smokeless Tobacco Never Used   Counseling given: Not Answered   Outpatient Medications Prior to Visit  Medication Sig Dispense Refill  . acetaminophen (TYLENOL) 500 MG tablet Take 1,000 mg by mouth every 6 (six) hours as needed for moderate pain or headache.    Marland Kitchen aspirin 81 MG tablet Take 81 mg by mouth daily.    . cholecalciferol (VITAMIN D) 1000 units tablet Take 1,000 Units by mouth daily.     . Cyanocobalamin (HM SUPER VITAMIN B12) 2500 MCG CHEW Chew 2,500 mcg by mouth daily.    . fesoterodine (TOVIAZ) 4 MG TB24 tablet Take 4 mg by mouth daily.    Marland Kitchen gabapentin (NEURONTIN) 100 MG capsule Take 100 mg by mouth at bedtime.    . iron polysaccharides (NIFEREX) 150 MG capsule  Take 150 mg by mouth every Tuesday.     . levothyroxine (SYNTHROID, LEVOTHROID) 112 MCG tablet Take 112 mcg by mouth daily before breakfast.     . loratadine (CLARITIN) 10 MG tablet Take 10 mg by mouth daily.     . megestrol (MEGACE) 20 MG tablet Take 40 mg by mouth daily.     . montelukast (SINGULAIR) 10 MG tablet Take 1 tablet (10 mg total) by mouth at bedtime. 30 tablet 5  . Polyethyl Glycol-Propyl Glycol (SYSTANE OP) Place 1-2 drops into both eyes daily as needed (for dry eyes).     . propranolol (INDERAL) 20 MG tablet Take 20 mg by mouth daily.     . rosuvastatin (CRESTOR) 5 MG tablet Take 5 mg by mouth at bedtime.     . saccharomyces boulardii (FLORASTOR) 250 MG capsule Take 250 mg by mouth  daily.    Marland Kitchen triamcinolone cream (KENALOG) 0.1 % Apply 1 application topically daily as needed for itching.    . levETIRAcetam (KEPPRA) 500 MG tablet Take 1 tablet (500 mg total) by mouth 2 (two) times daily. (Patient not taking: Reported on 01/04/2018) 60 tablet 0   No facility-administered medications prior to visit.      Review of Systems  Constitutional:   No  weight loss, night sweats,  Fevers, chills,  +fatigue, or  lassitude.  HEENT:   No headaches,  Difficulty swallowing,  Tooth/dental problems, or  Sore throat,                No sneezing, itching, ear ache, nasal congestion, post nasal drip,   CV:  No chest pain,  Orthopnea, PND, swelling in lower extremities, anasarca, dizziness, palpitations, syncope.   GI  No heartburn, indigestion, abdominal pain, nausea, vomiting, diarrhea, change in bowel habits, loss of appetite, bloody stools.   Resp: No shortness of breath with exertion or at rest.  No excess mucus, no productive cough,  No non-productive cough,  No coughing up of blood.  No change in color of mucus.  No wheezing.  No chest wall deformity  Skin: no rash or lesions.  GU: no dysuria, change in color of urine, no urgency or frequency.  No flank pain, no hematuria   MS:  No joint pain or swelling.  No decreased range of motion.  No back pain.    Physical Exam  BP 122/76   Pulse 95   Ht 6' (1.829 m)   Wt 181 lb 6.4 oz (82.3 kg)   SpO2 97%   BMI 24.60 kg/m   GEN: A/Ox3; pleasant , NAD, frail and elderly with a walker   HEENT:  [place]/AT,  EACs-clear, TMs-wnl, NOSE-clear, THROAT-clear, no lesions, no postnasal drip or exudate noted.   NECK:  Supple w/ fair ROM; no JVD; normal carotid impulses w/o bruits; no thyromegaly or nodules palpated; no lymphadenopathy.    RESP  Decreased BS in bases  no accessory muscle use, no dullness to percussion  CARD:  RRR, no m/r/g, no peripheral edema, pulses intact, no cyanosis or clubbing.  GI:   Soft & nt; nml bowel sounds; no  organomegaly or masses detected.   Musco: Warm bil, no deformities or joint swelling noted.   Neuro: alert, no focal deficits noted.    Skin: Warm, no lesions or rashes    Lab Results:  CBC  ProBNP No results found for: PROBNP  Imaging: No results found.    PFT Results Latest Ref Rng & Units 12/03/2015  FVC-Pre L  3.74  FVC-Predicted Pre % 90  FVC-Post L 3.99  FVC-Predicted Post % 96  Pre FEV1/FVC % % 70  Post FEV1/FCV % % 72  FEV1-Pre L 2.63  FEV1-Predicted Pre % 90  FEV1-Post L 2.86  DLCO UNC% % 58  DLCO COR %Predicted % 72  TLC L 6.30  TLC % Predicted % 84  RV % Predicted % 86    No results found for: NITRICOXIDE      Assessment & Plan:   COPD (chronic obstructive pulmonary disease) (HCC) Mild COPD essentially asymptomatic. Patient with no flare of symptoms off of maintenance therapy.  Patient is advised to follow-up with our office as needed.  Continue to follow-up with his primary care physician.  OSA (obstructive sleep apnea) Mild sleep apnea with CPAP intolerance.  Patient's CPAP caused him quite a bit of stress.  Patient does not notice any increased symptoms off his CPAP.  He may follow-up with our office as needed advised if daytime sleepiness increases to contact us and we can try to restart CPAP.     Rexene Edison, NP 01/04/2018

## 2018-01-04 NOTE — Patient Instructions (Signed)
Follow up with Pulmonary as needed.

## 2018-01-04 NOTE — Assessment & Plan Note (Signed)
Mild COPD essentially asymptomatic. Patient with no flare of symptoms off of maintenance therapy.  Patient is advised to follow-up with our office as needed.  Continue to follow-up with his primary care physician.

## 2018-01-04 NOTE — Assessment & Plan Note (Signed)
Mild sleep apnea with CPAP intolerance.  Patient's CPAP caused him quite a bit of stress.  Patient does not notice any increased symptoms off his CPAP.  He may follow-up with our office as needed advised if daytime sleepiness increases to contact us and we can try to restart CPAP.

## 2018-01-05 DIAGNOSIS — M1711 Unilateral primary osteoarthritis, right knee: Secondary | ICD-10-CM | POA: Diagnosis not present

## 2018-01-09 ENCOUNTER — Telehealth: Payer: Self-pay | Admitting: *Deleted

## 2018-01-09 DIAGNOSIS — M1711 Unilateral primary osteoarthritis, right knee: Secondary | ICD-10-CM | POA: Diagnosis not present

## 2018-01-09 NOTE — Telephone Encounter (Signed)
Attempted to reach patient to discuss "tachy" episode noted on LINQ from 01/08/18 at [place]. No answer, no VM setup. Wife's number is the same as patient's number.  ECG appears noise artifact/EMI. Will ask patient what he was doing at time of episode.

## 2018-01-12 DIAGNOSIS — M1711 Unilateral primary osteoarthritis, right knee: Secondary | ICD-10-CM | POA: Diagnosis not present

## 2018-01-13 LAB — CUP PACEART REMOTE DEVICE CHECK
MDC IDC PG IMPLANT DT: 20190815
MDC IDC SESS DTM: 20191101193904

## 2018-01-16 ENCOUNTER — Ambulatory Visit (INDEPENDENT_AMBULATORY_CARE_PROVIDER_SITE_OTHER): Payer: MEDICARE | Admitting: Cardiovascular Disease

## 2018-01-16 ENCOUNTER — Encounter: Payer: Self-pay | Admitting: Cardiovascular Disease

## 2018-01-16 VITALS — BP 119/68 | HR 76 | Ht 72.0 in | Wt 182.6 lb

## 2018-01-16 DIAGNOSIS — G250 Essential tremor: Secondary | ICD-10-CM

## 2018-01-16 DIAGNOSIS — I10 Essential (primary) hypertension: Secondary | ICD-10-CM | POA: Diagnosis not present

## 2018-01-16 DIAGNOSIS — I700 Atherosclerosis of aorta: Secondary | ICD-10-CM | POA: Diagnosis not present

## 2018-01-16 DIAGNOSIS — E7800 Pure hypercholesterolemia, unspecified: Secondary | ICD-10-CM | POA: Diagnosis not present

## 2018-01-16 DIAGNOSIS — I5189 Other ill-defined heart diseases: Secondary | ICD-10-CM | POA: Diagnosis not present

## 2018-01-16 DIAGNOSIS — Z4509 Encounter for adjustment and management of other cardiac device: Secondary | ICD-10-CM

## 2018-01-16 DIAGNOSIS — I639 Cerebral infarction, unspecified: Secondary | ICD-10-CM | POA: Diagnosis not present

## 2018-01-16 DIAGNOSIS — R55 Syncope and collapse: Secondary | ICD-10-CM

## 2018-01-16 DIAGNOSIS — M1711 Unilateral primary osteoarthritis, right knee: Secondary | ICD-10-CM | POA: Diagnosis not present

## 2018-01-16 NOTE — Telephone Encounter (Signed)
Attempted to reach patient. No answer, no VM setup. °

## 2018-01-16 NOTE — Progress Notes (Signed)
Cardiology Office Note    Date:  01/17/2018   ID:  Randy Sullivan, DOB 1928-03-24, MRN [id number]  PCP:  Prince Solian, MD  Cardiologist:   Sanda Klein, MD   Chief Complaint  Patient presents with  . Follow-up    syncope, loop recorder    History of Present Illness:  Randy Sullivan is a 84 y.o. male with long-standing hypertension and hyperlipidemia, mild obstructive sleep apnea, treated hypothyroidism returning in follow-up after loop recorder implantation for syncope.  Since implantation of the loop recorder has not had any symptomatic events.  In general he feels well.The patient specifically denies any chest pain at rest exertion, dyspnea at rest or with exertion, orthopnea, paroxysmal nocturnal dyspnea, syncope, palpitations, focal neurological deficits, intermittent claudication, lower extremity edema, unexplained weight gain, cough, hemoptysis or wheezing.  His wife reports that he has not had any of his blanking out spells.  She does report that in the past he used to happen fairly consistently right after breakfast.  Interrogation of the loop recorder does not show any recorded abnormalities except for 1 episode of artifact.  One year ago he underwent a treadmill stress test and was only able to exercise for 3 minutes and 10 seconds, but did not have any abnormal ECG changes or chest discomfort. He was easily able to achieve a heart rate equal to maximum predicted for his age. An echocardiogram showed normal left ventricular ejection fraction 55-60%, with moderate LVH and grade 1 diastolic dysfunction (E/e' 9.5), normal size left atrium. There was no evidence of aortic stenosis and aortic root was only borderline dilated. Pulmonary function tests showed mild COPD (Dr. Corrie Dandy). Sleep study showed mild obstructive sleep apnea (Dr. Rexene Alberts).  He has resting and intentional tremor, more prominent in his dominant right hand.  Past Medical History:  Diagnosis Date  . BPH  (benign prostatic hyperplasia)   . Cancer ([place])    non melanoma skin cancers removed  . DJD (degenerative joint disease)   . Familial tremor   . GERD (gastroesophageal reflux disease)   . Heart murmur    hx of  . Hyperlipidemia   . Hypertension   . Hypothyroidism   . Mood disorder ([place])   . Osteoarthritis   . Prostatism   . Shortness of breath    mild with excertion  . Sleep apnea    cpap  . Syncope 01/13/2017  . Thyroid disease     Past Surgical History:  Procedure Laterality Date  . APPENDECTOMY    . EYE SURGERY     bil cataracts  . HEMORROIDECTOMY    . INNER EAR SURGERY Right    With impant.   Marland Kitchen LOOP RECORDER INSERTION N/A 10/06/2017   Procedure: LOOP RECORDER INSERTION;  Surgeon: Sanda Klein, MD;  Location: [facility];  Service: Cardiovascular;  Laterality: N/A;  . TONSILLECTOMY    . TOTAL KNEE ARTHROPLASTY Left 11/08/2016   Procedure: LEFT TOTAL KNEE ARTHROPLASTY;  Surgeon: Gaynelle Arabian, MD;  Location: [facility];  Service: Orthopedics;  Laterality: Left;  Adductor Block    Current Medications: Outpatient Medications Prior to Visit  Medication Sig Dispense Refill  . acetaminophen (TYLENOL) 500 MG tablet Take 1,000 mg by mouth every 6 (six) hours as needed for moderate pain or headache.    Marland Kitchen aspirin 81 MG tablet Take 81 mg by mouth daily.    . cholecalciferol (VITAMIN D) 1000 units tablet Take 1,000 Units by mouth daily.     Marland Kitchen  Cyanocobalamin (HM SUPER VITAMIN B12) 2500 MCG CHEW Chew 2,500 mcg by mouth daily.    Marland Kitchen gabapentin (NEURONTIN) 100 MG capsule Take 100 mg by mouth at bedtime.    . iron polysaccharides (NIFEREX) 150 MG capsule Take 150 mg by mouth every Tuesday.     . levothyroxine (SYNTHROID, LEVOTHROID) 112 MCG tablet Take 112 mcg by mouth daily before breakfast.     . loratadine (CLARITIN) 10 MG tablet Take 10 mg by mouth daily.     . megestrol (MEGACE) 20 MG tablet Take 40 mg by mouth daily.     . mirabegron ER (MYRBETRIQ) 50 MG TB24 tablet Take  50 mg by mouth daily.    . montelukast (SINGULAIR) 10 MG tablet Take 1 tablet (10 mg total) by mouth at bedtime. 30 tablet 5  . Polyethyl Glycol-Propyl Glycol (SYSTANE OP) Place 1-2 drops into both eyes daily as needed (for dry eyes).     . propranolol (INDERAL) 20 MG tablet Take 20 mg by mouth daily.     . rosuvastatin (CRESTOR) 5 MG tablet Take 5 mg by mouth at bedtime.     . saccharomyces boulardii (FLORASTOR) 250 MG capsule Take 250 mg by mouth daily.    Marland Kitchen triamcinolone cream (KENALOG) 0.1 % Apply 1 application topically daily as needed for itching.    . fesoterodine (TOVIAZ) 4 MG TB24 tablet Take 4 mg by mouth daily.     No facility-administered medications prior to visit.      Allergies:   Tizanidine   Social History   Socioeconomic History  . Marital status: Married    Spouse name: Not on file  . Number of children: Not on file  . Years of education: Not on file  . Highest education level: Not on file  Occupational History  . Occupation: retired  Scientific laboratory technician  . Financial resource strain: Not on file  . Food insecurity:    Worry: Not on file    Inability: Not on file  . Transportation needs:    Medical: Not on file    Non-medical: Not on file  Tobacco Use  . Smoking status: Former Smoker    Packs/day: 2.00    Years: 18.00    Pack years: 36.00    Types: Cigarettes    Last attempt to quit: 06/04/1970    Years since quitting: 47.6  . Smokeless tobacco: Never Used  Substance and Sexual Activity  . Alcohol use: Yes    Alcohol/week: 0.0 standard drinks    Comment: special occasions  . Drug use: No  . Sexual activity: Not Currently  Lifestyle  . Physical activity:    Days per week: Not on file    Minutes per session: Not on file  . Stress: Not on file  Relationships  . Social connections:    Talks on phone: Not on file    Gets together: Not on file    Attends religious service: Not on file    Active member of club or organization: Not on file    Attends  meetings of clubs or organizations: Not on file    Relationship status: Not on file  Other Topics Concern  . Not on file  Social History Narrative   Rare caffeine use      Family History:  The patient's family history includes Colon cancer in his mother and sister; Heart attack in his father.   ROS:   Please see the history of present illness.    All other  systems are reviewed and are negative  PHYSICAL EXAM:   VS:  BP 119/68   Pulse 76   Ht 6' (1.829 m)   Wt 182 lb 9.6 oz (82.8 kg)   BMI 24.77 kg/m     General: Alert, oriented x3, no distress, lean Head: no evidence of trauma, PERRL, EOMI, no exophtalmos or lid lag, no myxedema, no xanthelasma; normal ears, nose and oropharynx Neck: normal jugular venous pulsations and no hepatojugular reflux; brisk carotid pulses without delay and no carotid bruits Chest: clear to auscultation, no signs of consolidation by percussion or palpation, normal fremitus, symmetrical and full respiratory excursions, well-healed loop recorder site Cardiovascular: normal position and quality of the apical impulse, regular rhythm, normal first and second heart sounds, no murmurs, rubs or gallops Abdomen: no tenderness or distention, no masses by palpation, no abnormal pulsatility or arterial bruits, normal bowel sounds, no hepatosplenomegaly Extremities: no clubbing, cyanosis or edema; 2+ radial, ulnar and brachial pulses bilaterally; 2+ right femoral, posterior tibial and dorsalis pedis pulses; 2+ left femoral, posterior tibial and dorsalis pedis pulses; no subclavian or femoral bruits Neurological: grossly nonfocal Psych: Normal mood and affect   Wt Readings from Last 3 Encounters:  01/16/18 182 lb 9.6 oz (82.8 kg)  01/04/18 181 lb 6.4 oz (82.3 kg)  10/06/17 172 lb (78 kg)      Studies/Labs Reviewed:   EKG:  EKG is ordered today.  The ekg ordered today demonstrates sinus rhythm left anterior fascicular block   ASSESSMENT:    1. Syncope,  unspecified syncope type   2. Encounter for loop recorder check   3. Diastolic dysfunction   4. Essential hypertension   5. Hypercholesterolemia   6. Aortic atherosclerosis ([place])   7. Essential tremor      PLAN:  In order of problems listed above:  1. Possible syncope: Checked Mr. Mayabb's blood pressure sitting standing and after standing for 3 minutes was consistently 108-110/64-67.  I could not document orthostatic hypotension today, but the fact that his symptoms have been not long after his early morning meal make me wonder whether this might be the cause of his events.   2. ILR: So far, no true events recorded by his device and no symptoms have occurred since loop recorder implantation.   3. Diast dysf: Based on echo findings but without evidence of left atrial dilation and without clinical heart failure 4. HTN: Good control. Does have LVH on echo. On beta blocker for essential tremor. Did not have evidence of chronotropic incompetence on treadmill testing. On the contrary, heart rate response was exaggerated suggesting deconditioning. 5. HLP: in spite of markedly elevated LDL cholesterol levels, he does not have established coronary disease or other serious vascular problems. Aortic atherosclerosis is seen on imaging studies (CT chest 2011). On statin. 6. Ao atherosclerosis:  No history of TIA or stroke. Taking aspirin. 7. Essential tremor: Improved with beta blocker.  It is probably worth continuing this medication, unless we discover that he has substantial orthostatic hypotension    Medication Adjustments/Labs and Tests Ordered: Current medicines are reviewed at length with the patient today.  Concerns regarding medicines are outlined above.  Medication changes, Labs and Tests ordered today are listed in the Patient Instructions below. Patient Instructions  Medication Instructions:  Dr Sallyanne Kuster recommends that you continue on your current medications as directed. Please refer to  the Current Medication list given to you today.  If you need a refill on your cardiac medications before your next appointment,  please call your pharmacy.   Follow-Up: At [facility], you and your health needs are our priority.  As part of our continuing mission to provide you with exceptional heart care, we have created designated Provider Care Teams.  These Care Teams include your primary Cardiologist (physician) and Advanced Practice Providers (APPs -  Physician Assistants and Nurse Practitioners) who all work together to provide you with the care you need, when you need it. You will need a follow up appointment in 6 months.  Please call our office 2 months in advance to schedule this appointment.  You may see Sanda Klein, MD or one of the following Advanced Practice Providers on your designated Care Team: [place], [place] . Fabian Sharp, PA-C    Your physician has requested that you regularly monitor your blood pressure at home. Check your blood pressure sitting and standing after breakfast. Please use the same machine to check your blood pressure daily. Keep a record of your blood pressures using the log sheet provided. In 2 weeks, please report your readings back to Dr C. You may use our online patient portal 'MyChart' or you can call the office to speak with a nurse.    Signed, Sanda Klein, MD  01/17/2018 1:03 PM    [facility] [address], [place], [place]  [address] Phone: [phone]; Fax: [phone]

## 2018-01-16 NOTE — Patient Instructions (Addendum)
Medication Instructions:  Dr Sallyanne Kuster recommends that you continue on your current medications as directed. Please refer to the Current Medication list given to you today.  If you need a refill on your cardiac medications before your next appointment, please call your pharmacy.   Follow-Up: At [facility], you and your health needs are our priority.  As part of our continuing mission to provide you with exceptional heart care, we have created designated Provider Care Teams.  These Care Teams include your primary Cardiologist (physician) and Advanced Practice Providers (APPs -  Physician Assistants and Nurse Practitioners) who all work together to provide you with the care you need, when you need it. You will need a follow up appointment in 6 months.  Please call our office 2 months in advance to schedule this appointment.  You may see Sanda Klein, MD or one of the following Advanced Practice Providers on your designated Care Team: [place], [place] . Fabian Sharp, PA-C    Your physician has requested that you regularly monitor your blood pressure at home. Check your blood pressure sitting and standing after breakfast. Please use the same machine to check your blood pressure daily. Keep a record of your blood pressures using the log sheet provided. In 2 weeks, please report your readings back to Dr C. You may use our online patient portal 'MyChart' or you can call the office to speak with a nurse.

## 2018-01-17 ENCOUNTER — Encounter: Payer: Self-pay | Admitting: Cardiovascular Disease

## 2018-01-18 DIAGNOSIS — M1711 Unilateral primary osteoarthritis, right knee: Secondary | ICD-10-CM | POA: Diagnosis not present

## 2018-01-24 DIAGNOSIS — N3941 Urge incontinence: Secondary | ICD-10-CM | POA: Diagnosis not present

## 2018-01-25 ENCOUNTER — Ambulatory Visit (INDEPENDENT_AMBULATORY_CARE_PROVIDER_SITE_OTHER): Payer: MEDICARE

## 2018-01-25 DIAGNOSIS — R55 Syncope and collapse: Secondary | ICD-10-CM | POA: Diagnosis not present

## 2018-01-25 NOTE — Telephone Encounter (Signed)
Spoke with patient's wife, Randy Sullivan ([place]). She reports that they discussed this artifact episode with Dr. Sallyanne Kuster at patient's appointment on 01/16/18 and they are unable to recall what patient was doing at the time of the episode. Patient's wife verbalizes understanding of instructions to call with any questions or concerns. She thanked me for my call.

## 2018-01-26 DIAGNOSIS — M1711 Unilateral primary osteoarthritis, right knee: Secondary | ICD-10-CM | POA: Diagnosis not present

## 2018-01-26 NOTE — Progress Notes (Signed)
Carelink Summary Report / Loop Recorder 

## 2018-01-31 DIAGNOSIS — M1711 Unilateral primary osteoarthritis, right knee: Secondary | ICD-10-CM | POA: Diagnosis not present

## 2018-02-10 DIAGNOSIS — M1711 Unilateral primary osteoarthritis, right knee: Secondary | ICD-10-CM | POA: Diagnosis not present

## 2018-02-16 DIAGNOSIS — R3915 Urgency of urination: Secondary | ICD-10-CM | POA: Diagnosis not present

## 2018-02-16 DIAGNOSIS — R350 Frequency of micturition: Secondary | ICD-10-CM | POA: Diagnosis not present

## 2018-02-17 DIAGNOSIS — M1711 Unilateral primary osteoarthritis, right knee: Secondary | ICD-10-CM | POA: Diagnosis not present

## 2018-02-27 ENCOUNTER — Ambulatory Visit (INDEPENDENT_AMBULATORY_CARE_PROVIDER_SITE_OTHER): Payer: MEDICARE

## 2018-02-27 DIAGNOSIS — R55 Syncope and collapse: Secondary | ICD-10-CM

## 2018-02-28 LAB — CUP PACEART REMOTE DEVICE CHECK
Implantable Pulse Generator Implant Date: 20190815
MDC IDC SESS DTM: 20200106210903

## 2018-02-28 NOTE — Progress Notes (Signed)
Carelink Summary Report / Loop Recorder 

## 2018-03-03 DIAGNOSIS — R350 Frequency of micturition: Secondary | ICD-10-CM | POA: Diagnosis not present

## 2018-03-03 DIAGNOSIS — N3941 Urge incontinence: Secondary | ICD-10-CM | POA: Diagnosis not present

## 2018-03-10 LAB — CUP PACEART REMOTE DEVICE CHECK
Implantable Pulse Generator Implant Date: 20190815
MDC IDC SESS DTM: 20191204203927

## 2018-03-24 LAB — CUP PACEART INCLINIC DEVICE CHECK
Date Time Interrogation Session: 20200131151225
MDC IDC PG IMPLANT DT: 20190815

## 2018-03-25 ENCOUNTER — Emergency Department (HOSPITAL_COMMUNITY): Payer: MEDICARE

## 2018-03-25 ENCOUNTER — Encounter (HOSPITAL_COMMUNITY): Payer: Self-pay | Admitting: Emergency Medicine

## 2018-03-25 ENCOUNTER — Emergency Department (HOSPITAL_COMMUNITY)
Admission: EM | Admit: 2018-03-25 | Discharge: 2018-03-25 | Disposition: A | Discharge status: Home / Self Care | Payer: MEDICARE | Attending: Emergency Medicine | Admitting: Emergency Medicine

## 2018-03-25 DIAGNOSIS — Y999 Unspecified external cause status: Secondary | ICD-10-CM | POA: Diagnosis not present

## 2018-03-25 DIAGNOSIS — I4581 Long QT syndrome: Secondary | ICD-10-CM | POA: Diagnosis not present

## 2018-03-25 DIAGNOSIS — E039 Hypothyroidism, unspecified: Secondary | ICD-10-CM | POA: Insufficient documentation

## 2018-03-25 DIAGNOSIS — R55 Syncope and collapse: Secondary | ICD-10-CM | POA: Diagnosis not present

## 2018-03-25 DIAGNOSIS — Z85828 Personal history of other malignant neoplasm of skin: Secondary | ICD-10-CM | POA: Insufficient documentation

## 2018-03-25 DIAGNOSIS — W19XXXA Unspecified fall, initial encounter: Secondary | ICD-10-CM

## 2018-03-25 DIAGNOSIS — J9811 Atelectasis: Secondary | ICD-10-CM | POA: Diagnosis not present

## 2018-03-25 DIAGNOSIS — I499 Cardiac arrhythmia, unspecified: Secondary | ICD-10-CM | POA: Diagnosis not present

## 2018-03-25 DIAGNOSIS — R05 Cough: Secondary | ICD-10-CM | POA: Diagnosis not present

## 2018-03-25 DIAGNOSIS — N183 Chronic kidney disease, stage 3 (moderate): Secondary | ICD-10-CM | POA: Diagnosis not present

## 2018-03-25 DIAGNOSIS — Z87891 Personal history of nicotine dependence: Secondary | ICD-10-CM | POA: Insufficient documentation

## 2018-03-25 DIAGNOSIS — I129 Hypertensive chronic kidney disease with stage 1 through stage 4 chronic kidney disease, or unspecified chronic kidney disease: Secondary | ICD-10-CM | POA: Insufficient documentation

## 2018-03-25 DIAGNOSIS — Z043 Encounter for examination and observation following other accident: Secondary | ICD-10-CM | POA: Insufficient documentation

## 2018-03-25 DIAGNOSIS — Y92031 Bathroom in apartment as the place of occurrence of the external cause: Secondary | ICD-10-CM | POA: Insufficient documentation

## 2018-03-25 DIAGNOSIS — R410 Disorientation, unspecified: Secondary | ICD-10-CM | POA: Diagnosis not present

## 2018-03-25 DIAGNOSIS — W1830XA Fall on same level, unspecified, initial encounter: Secondary | ICD-10-CM | POA: Insufficient documentation

## 2018-03-25 DIAGNOSIS — R4182 Altered mental status, unspecified: Secondary | ICD-10-CM | POA: Diagnosis not present

## 2018-03-25 DIAGNOSIS — Z79899 Other long term (current) drug therapy: Secondary | ICD-10-CM | POA: Diagnosis not present

## 2018-03-25 DIAGNOSIS — R404 Transient alteration of awareness: Secondary | ICD-10-CM | POA: Diagnosis not present

## 2018-03-25 DIAGNOSIS — Y939 Activity, unspecified: Secondary | ICD-10-CM | POA: Insufficient documentation

## 2018-03-25 DIAGNOSIS — I491 Atrial premature depolarization: Secondary | ICD-10-CM | POA: Diagnosis not present

## 2018-03-25 LAB — URINALYSIS, ROUTINE W REFLEX MICROSCOPIC
BACTERIA UA: NONE SEEN
Bilirubin Urine: NEGATIVE
Glucose, UA: NEGATIVE mg/dL
Ketones, ur: NEGATIVE mg/dL
Leukocytes, UA: NEGATIVE
Nitrite: NEGATIVE
Protein, ur: NEGATIVE mg/dL
Specific Gravity, Urine: 1.013 (ref 1.005–1.030)
pH: 6 (ref 5.0–8.0)

## 2018-03-25 LAB — COMPREHENSIVE METABOLIC PANEL
ALBUMIN: 3.9 g/dL (ref 3.5–5.0)
ALT: 21 U/L (ref 0–44)
AST: 26 U/L (ref 15–41)
Alkaline Phosphatase: 50 U/L (ref 38–126)
Anion gap: 10 (ref 5–15)
BUN: 22 mg/dL (ref 8–23)
CO2: 22 mmol/L (ref 22–32)
Calcium: 9.3 mg/dL (ref 8.9–10.3)
Chloride: 103 mmol/L (ref 98–111)
Creatinine, Ser: 1.54 mg/dL — ABNORMAL HIGH (ref 0.61–1.24)
GFR calc Af Amer: 46 mL/min — ABNORMAL LOW (ref >60)
GFR calc non Af Amer: 40 mL/min — ABNORMAL LOW (ref >60)
GLUCOSE: 95 mg/dL (ref 70–99)
Potassium: 4.4 mmol/L (ref 3.5–5.1)
Sodium: 135 mmol/L (ref 135–145)
Total Bilirubin: 0.7 mg/dL (ref 0.3–1.2)
Total Protein: 7.2 g/dL (ref 6.5–8.1)

## 2018-03-25 LAB — CBC WITH DIFFERENTIAL/PLATELET
Abs Immature Granulocytes: 0.03 10*3/uL (ref 0.00–0.07)
Basophils Absolute: 0.1 10*3/uL (ref 0.0–0.1)
Basophils Relative: 1 %
EOS ABS: 0.4 10*3/uL (ref 0.0–0.5)
Eosinophils Relative: 7 %
HCT: 32.4 % — ABNORMAL LOW (ref 39.0–52.0)
Hemoglobin: 10.5 g/dL — ABNORMAL LOW (ref 13.0–17.0)
Immature Granulocytes: 1 %
Lymphocytes Relative: 28 %
Lymphs Abs: 1.7 10*3/uL (ref 0.7–4.0)
MCH: 31.3 pg (ref 26.0–34.0)
MCHC: 32.4 g/dL (ref 30.0–36.0)
MCV: 96.7 fL (ref 80.0–100.0)
Monocytes Absolute: 0.8 10*3/uL (ref 0.1–1.0)
Monocytes Relative: 14 %
Neutro Abs: 3 10*3/uL (ref 1.7–7.7)
Neutrophils Relative %: 49 %
Platelets: 198 10*3/uL (ref 150–400)
RBC: 3.35 MIL/uL — ABNORMAL LOW (ref 4.22–5.81)
RDW: 12.4 % (ref 11.5–15.5)
WBC: 5.9 10*3/uL (ref 4.0–10.5)
nRBC: 0 % (ref 0.0–0.2)

## 2018-03-25 LAB — I-STAT TROPONIN, ED
Troponin i, poc: 0.01 ng/mL (ref 0.00–0.08)
Troponin i, poc: 0.01 ng/mL (ref 0.00–0.08)

## 2018-03-25 LAB — INFLUENZA PANEL BY PCR (TYPE A & B)
Influenza A By PCR: NEGATIVE
Influenza B By PCR: NEGATIVE

## 2018-03-25 LAB — LACTIC ACID, PLASMA
Lactic Acid, Venous: 0.8 mmol/L (ref 0.5–1.9)
Lactic Acid, Venous: 0.7 mmol/L (ref 0.5–1.9)

## 2018-03-25 LAB — LIPASE, BLOOD: Lipase: 35 U/L (ref 11–51)

## 2018-03-25 NOTE — ED Notes (Signed)
Patient transported to X-ray 

## 2018-03-25 NOTE — ED Notes (Signed)
[employer] report given to EDP 

## 2018-03-25 NOTE — ED Provider Notes (Signed)
[facility] EMERGENCY DEPARTMENT Provider Note   CSN: [id number] Arrival date & time: 03/25/18  5621     History   Chief Complaint Chief Complaint  Patient presents with  . Fall    HPI Randy Sullivan is a 85 y.o. male.  The history is provided by the patient.  Fall  This is a new problem. The current episode started 6 to 12 hours ago. The problem has been resolved. Pertinent negatives include no chest pain, no abdominal pain, no headaches and no shortness of breath. Nothing aggravates the symptoms. Nothing relieves the symptoms. He has tried nothing for the symptoms. The treatment provided no relief.    Past Medical History:  Diagnosis Date  . BPH (benign prostatic hyperplasia)   . Cancer ([place])    non melanoma skin cancers removed  . DJD (degenerative joint disease)   . Familial tremor   . GERD (gastroesophageal reflux disease)   . Heart murmur    hx of  . Hyperlipidemia   . Hypertension   . Hypothyroidism   . Mood disorder ([place])   . Osteoarthritis   . Prostatism   . Shortness of breath    mild with excertion  . Sleep apnea    cpap  . Syncope 01/13/2017  . Thyroid disease     Patient Active Problem List   Diagnosis Date Noted  . Thyroid disease   . Sleep apnea   . Shortness of breath   . Prostatism   . Osteoarthritis   . Mood disorder ([place])   . Hypothyroidism   . Hypertension   . Hyperlipidemia   . Heart murmur   . GERD (gastroesophageal reflux disease)   . Familial tremor   . DJD (degenerative joint disease)   . Cancer ([place])   . Seizure ([place]) 01/21/2017  . Transient speech disturbance   . CKD (chronic kidney disease), stage III ([place]) 01/13/2017  . Syncope 01/13/2017  . OA (osteoarthritis) of knee 11/08/2016  . Essential hypertension 02/05/2016  . Thoracic aortic atherosclerosis ([place]) 02/05/2016  . Benign familial tremor 02/05/2016  . COPD (chronic obstructive pulmonary disease) ([place]) 01/21/2016  . OSA (obstructive sleep apnea)  10/23/2015  . Upper airway cough syndrome 10/23/2015  . Exertional dyspnea 10/23/2015  . RLS (restless legs syndrome) 10/23/2015  . Osteoarthritis of knee 06/04/2010  . Dizziness 06/04/2010  . Benign hypertensive heart disease without heart failure 06/04/2010  . Hypercholesterolemia 06/04/2010  . BPH (benign prostatic hyperplasia) 06/04/2010    Past Surgical History:  Procedure Laterality Date  . APPENDECTOMY    . EYE SURGERY     bil cataracts  . HEMORROIDECTOMY    . INNER EAR SURGERY Right    With impant.   Marland Kitchen LOOP RECORDER INSERTION N/A 10/06/2017   Procedure: LOOP RECORDER INSERTION;  Surgeon: Sanda Klein, MD;  Location: [facility];  Service: Cardiovascular;  Laterality: N/A;  . TONSILLECTOMY    . TOTAL KNEE ARTHROPLASTY Left 11/08/2016   Procedure: LEFT TOTAL KNEE ARTHROPLASTY;  Surgeon: Gaynelle Arabian, MD;  Location: [facility];  Service: Orthopedics;  Laterality: Left;  Adductor Block        Home Medications    Prior to Admission medications   Medication Sig Start Date End Date Taking? Authorizing Provider  acetaminophen (TYLENOL) 500 MG tablet Take 1,000 mg by mouth every 6 (six) hours as needed for moderate pain or headache.   Yes [provider]  aspirin 81 MG tablet Take 81 mg by mouth daily.  Yes [provider]  azithromycin (ZITHROMAX) 250 MG tablet Take 250-500 mg by mouth daily. 500 mg first dose. 250 mg the next 4 days   Yes [provider]  cholecalciferol (VITAMIN D) 1000 units tablet Take 1,000 Units by mouth daily.    Yes [provider]  Cyanocobalamin (HM SUPER VITAMIN B12) 2500 MCG CHEW Chew 2,500 mcg by mouth daily.   Yes [provider]  gabapentin (NEURONTIN) 100 MG capsule Take 100 mg by mouth at bedtime. 10/28/16  Yes [provider]  iron polysaccharides (NIFEREX) 150 MG capsule Take 150 mg by mouth every Tuesday.    Yes [provider]  levothyroxine (SYNTHROID, LEVOTHROID) 112 MCG  tablet Take 112 mcg by mouth daily before breakfast.  10/14/14  Yes [provider]  loratadine (CLARITIN) 10 MG tablet Take 10 mg by mouth daily.    Yes [provider]  megestrol (MEGACE) 20 MG tablet Take 40 mg by mouth daily.    Yes [provider]  mirabegron ER (MYRBETRIQ) 50 MG TB24 tablet Take 50 mg by mouth daily.   Yes [provider]  montelukast (SINGULAIR) 10 MG tablet Take 1 tablet (10 mg total) by mouth at bedtime. 11/07/17  Yes Rigoberto Noel, MD  Polyethyl Glycol-Propyl Glycol (SYSTANE OP) Place 1-2 drops into both eyes daily as needed (for dry eyes).    Yes [provider]  propranolol (INDERAL) 20 MG tablet Take 20 mg by mouth daily.  06/18/16  Yes [provider]  rosuvastatin (CRESTOR) 5 MG tablet Take 5 mg by mouth at bedtime.  06/23/16  Yes [provider]  saccharomyces boulardii (FLORASTOR) 250 MG capsule Take 250 mg by mouth daily.   Yes [provider]  solifenacin (VESICARE) 5 MG tablet Take 5 mg by mouth daily.   Yes [provider]  triamcinolone cream (KENALOG) 0.1 % Apply 1 application topically daily as needed for itching.   Yes [provider]    Family History Family History  Problem Relation Age of Onset  . Heart attack Father   . Colon cancer Mother   . Colon cancer Sister     Social History Social History   Tobacco Use  . Smoking status: Former Smoker    Packs/day: 2.00    Years: 18.00    Pack years: 36.00    Types: Cigarettes    Last attempt to quit: 06/04/1970    Years since quitting: 47.8  . Smokeless tobacco: Never Used  Substance Use Topics  . Alcohol use: Yes    Alcohol/week: 0.0 standard drinks    Comment: special occasions  . Drug use: No     Allergies   Tizanidine   Review of Systems Review of Systems  Constitutional: Negative for chills and fever.  HENT: Negative for ear pain and sore throat.   Eyes: Negative for pain and visual  disturbance.  Respiratory: Positive for cough (sputum production ). Negative for shortness of breath and wheezing.   Cardiovascular: Negative for chest pain and palpitations.  Gastrointestinal: Negative for abdominal pain and vomiting.  Genitourinary: Negative for dysuria and hematuria.  Musculoskeletal: Negative for arthralgias, back pain, joint swelling, neck pain and neck stiffness.  Skin: Negative for color change and rash.  Neurological: Negative for seizures, syncope and headaches.  All other systems reviewed and are negative.    Physical Exam Updated Vital Signs  ED Triage Vitals [03/25/18 0745]  Enc Vitals Group     BP (!) 178/89  Pulse Rate 96     Resp 16     Temp 98.7 F (37.1 C)     Temp Source Oral     SpO2 98 %     Weight      Height      Head Circumference      Peak Flow      Pain Score      Pain Loc      Pain Edu?      Excl. in [place]?     Physical Exam Vitals signs and nursing note reviewed.  Constitutional:      General: He is not in acute distress.    Appearance: He is well-developed.  HENT:     Head: Normocephalic and atraumatic.     Mouth/Throat:     Mouth: Mucous membranes are moist.  Eyes:     Extraocular Movements: Extraocular movements intact.     Conjunctiva/sclera: Conjunctivae normal.     Pupils: Pupils are equal, round, and reactive to light.  Neck:     Musculoskeletal: Neck supple. No muscular tenderness.     Comments: In cervical collar Cardiovascular:     Rate and Rhythm: Normal rate and regular rhythm.     Pulses: Normal pulses.     Heart sounds: Normal heart sounds. No murmur.  Pulmonary:     Effort: Pulmonary effort is normal. No respiratory distress.     Breath sounds: Normal breath sounds. No wheezing or rales.  Abdominal:     General: There is no distension.     Palpations: Abdomen is soft.     Tenderness: There is no abdominal tenderness.  Skin:    General: Skin is warm and dry.     Capillary Refill: Capillary refill  takes less than 2 seconds.  Neurological:     General: No focal deficit present.     Mental Status: He is alert.     Cranial Nerves: No cranial nerve deficit.     Sensory: No sensory deficit.     Motor: No weakness.     Comments: 5+ out of 5 strength throughout, normal sensation, cranial nerves intact, no drift, patient knows his name, where he is, no obvious aphasia, does seem mildly confused but is able to follow commands and answer questions correctly  Psychiatric:        Mood and Affect: Mood normal.      ED Treatments / Results  Labs (all labs ordered are listed, but only abnormal results are displayed) Labs Reviewed  CBC WITH DIFFERENTIAL/PLATELET - Abnormal; Notable for the following components:      Result Value   RBC 3.35 (*)    Hemoglobin 10.5 (*)    HCT 32.4 (*)    All other components within normal limits  COMPREHENSIVE METABOLIC PANEL - Abnormal; Notable for the following components:   Creatinine, Ser 1.54 (*)    GFR calc non Af Amer 40 (*)    GFR calc Af Amer 46 (*)    All other components within normal limits  URINALYSIS, ROUTINE W REFLEX MICROSCOPIC - Abnormal; Notable for the following components:   Hgb urine dipstick SMALL (*)    All other components within normal limits  LIPASE, BLOOD  LACTIC ACID, PLASMA  LACTIC ACID, PLASMA  INFLUENZA PANEL BY PCR (TYPE A & B)  I-STAT TROPONIN, ED  I-STAT TROPONIN, ED    EKG EKG Interpretation  Date/Time:  Saturday March 25 2018 07:30:02 EST Ventricular Rate:  99 PR Interval:  QRS Duration: 97 QT Interval:  393 QTC Calculation: 505 R Axis:   136 Text Interpretation:  Sinus rhythm w/pacs Ventricular premature complex Right axis deviation Prolonged QT interval Confirmed by Lennice Sites [id number]) on 03/25/2018 7:34:07 AM   Radiology Dg Chest 2 View  Result Date: 03/25/2018 CLINICAL DATA:  Cough for the past week.  Unwitnessed fall. EXAM: CHEST - 2 VIEW COMPARISON:  Chest x-ray dated April 15, 2017.  FINDINGS: The heart size and mediastinal contours are within normal limits. Normal pulmonary vascularity. Atherosclerotic calcification of the aortic arch. Unchanged low lung volumes and mild bibasilar atelectasis. No focal consolidation, pleural effusion, or pneumothorax. Unchanged interposition of the colon under the right hemidiaphragm. No acute osseous abnormality. IMPRESSION: Unchanged low lung volumes with mild bibasilar atelectasis. Electronically Signed   By: Titus Dubin M.D.   On: 03/25/2018 08:30   Ct Head Wo Contrast  Result Date: 03/25/2018 CLINICAL DATA:  Found on the floor by is wife. No obvious signs of trauma. EXAM: CT HEAD WITHOUT CONTRAST TECHNIQUE: Contiguous axial images were obtained from the base of the skull through the vertex without intravenous contrast. COMPARISON:  CT head dated April 15, 2017. FINDINGS: Brain: No evidence of acute infarction, hemorrhage, hydrocephalus, extra-axial collection or mass lesion/mass effect. Unchanged chronic lacunar infarct in the right thalamus. Stable moderate atrophy and mild chronic microvascular ischemic changes. Vascular: No hyperdense vessel or unexpected calcification. Skull: Normal. Negative for fracture or focal lesion. Sinuses/Orbits: Progressive paranasal sinus mucosal thickening. No air-fluid levels. The mastoid air cells are clear. The orbits are unremarkable. Other: None. IMPRESSION: 1.  No acute intracranial abnormality. 2. Stable atrophy and chronic microvascular ischemic changes. 3. Progressive paranasal sinus disease. Electronically Signed   By: Titus Dubin M.D.   On: 03/25/2018 08:47   Ct Cervical Spine Wo Contrast  Result Date: 03/25/2018 CLINICAL DATA:  Patient found down today.  Initial encounter. EXAM: CT CERVICAL SPINE WITHOUT CONTRAST TECHNIQUE: Multidetector CT imaging of the cervical spine was performed without intravenous contrast. Multiplanar CT image reconstructions were also generated. COMPARISON:  None.  FINDINGS: Alignment: Normal. Skull base and vertebrae: No acute fracture. No primary bone lesion or focal pathologic process. Soft tissues and spinal canal: No prevertebral fluid or swelling. No visible canal hematoma. Disc levels: Loss of disc space height and endplate spurring appear worst at C5-6 and C6-7. There is autologous fusion across the C2-3 and C3-4 disc interspace is and the left facets are ankylosed at these levels. Upper chest: Lung apices clear Other: None. IMPRESSION: No acute finding. Cervical spondylosis. Electronically Signed   By: Inge Rise M.D.   On: 03/25/2018 08:45    Procedures Procedures (including critical care time)  Medications Ordered in ED Medications - No data to display   Initial Impression / Assessment and Plan / ED Course  I have reviewed the triage vital signs and the nursing notes.  Pertinent labs & imaging results that were available during my care of the patient were reviewed by me and considered in my medical decision making (see chart for details).     Randy Sullivan is an 85 year old male with history of hypertension, high cholesterol, syncope who presents to the ED after a fall overnight, AMS.  Patient with hypertension upon arrival but otherwise normal vitals.  Temperature of 99.5.  Started on a Zithromax yesterday for cough.  Patient was found lying on the floor in the bathroom by his wife.  They went to bed around 930 last night.  Patient does  not remember what happened.  Unknown if he hit his head.  Patient overall appears neurologically intact.  No signs concerning for stroke.  Continues to have a cough with some sputum production.  Denies any body aches, fevers.  Has taken a Zithromax.  Currently is wearing a loop recorder as he has had some episodes of syncope.  Recent cardiology note says that loop recorder has not registered any events.  EKG appears to show sinus rhythm with PACs.  Patient denies any chest pain, shortness of breath.  He is in  a cervical collar.  Does not appear to have any midline tenderness.  Patient to be evaluated with labs including chest x-ray, head CT, neck CT.  Will check for influenza, urine infection as well. Syncope vs trauma vs infectious process causing AMS.  Troponin within normal limits.  Patient without any chest pain doubt cardiac process.  Influenza testing negative.  Lactic acid negative x2.  No significant leukocytosis, anemia, electrolyte abnormality.  Creatinine at baseline.  CT of the head and neck showed no acute injuries.  Chest x-ray showed no pneumonia, no pneumothorax, no pleural effusion.  Patient overall well-appearing.  Wife states that he appears to be at his baseline.  Neurologically patient is intact.  No concern for stroke.  Likely mechanical fall.  Loop recorder was interrogated by [employer] and there were no significant arrhythmic events.  Therefore patient was discharged in the ED and recommend follow-up with primary care doctor.  Patient does ambulate with a walker and does have home health.  This chart was dictated using voice recognition software.  Despite best efforts to proofread,  errors can occur which can change the documentation meaning.    Final Clinical Impressions(s) / ED Diagnoses   Final diagnoses:  Fall, initial encounter    ED Discharge Orders    None       Lennice Sites, DO 03/25/18 1059

## 2018-03-25 NOTE — ED Triage Notes (Addendum)
Per EMS- pt was found in floor in bathroom by wife. Normally her is alert and oriented X 4 but is A&OX2 currently. LSN at 930pm with a complaint of a sore throat. No obvious injuries. C-spine precautions in place. Pt started azithromycin yesterday per wife for sore throat. CBG 122. 18G PIV to LAC.

## 2018-03-28 DIAGNOSIS — M6281 Muscle weakness (generalized): Secondary | ICD-10-CM | POA: Diagnosis not present

## 2018-03-28 DIAGNOSIS — R55 Syncope and collapse: Secondary | ICD-10-CM | POA: Diagnosis not present

## 2018-03-28 DIAGNOSIS — Z8719 Personal history of other diseases of the digestive system: Secondary | ICD-10-CM | POA: Diagnosis not present

## 2018-03-28 DIAGNOSIS — E039 Hypothyroidism, unspecified: Secondary | ICD-10-CM | POA: Diagnosis not present

## 2018-03-28 DIAGNOSIS — I10 Essential (primary) hypertension: Secondary | ICD-10-CM | POA: Diagnosis not present

## 2018-03-28 DIAGNOSIS — R6889 Other general symptoms and signs: Secondary | ICD-10-CM | POA: Diagnosis not present

## 2018-03-28 DIAGNOSIS — R413 Other amnesia: Secondary | ICD-10-CM | POA: Diagnosis not present

## 2018-03-28 DIAGNOSIS — R5383 Other fatigue: Secondary | ICD-10-CM | POA: Diagnosis not present

## 2018-03-28 DIAGNOSIS — D649 Anemia, unspecified: Secondary | ICD-10-CM | POA: Diagnosis not present

## 2018-03-28 DIAGNOSIS — R2681 Unsteadiness on feet: Secondary | ICD-10-CM | POA: Diagnosis not present

## 2018-03-28 DIAGNOSIS — Z6825 Body mass index (BMI) 25.0-25.9, adult: Secondary | ICD-10-CM | POA: Diagnosis not present

## 2018-03-28 DIAGNOSIS — R350 Frequency of micturition: Secondary | ICD-10-CM | POA: Diagnosis not present

## 2018-03-30 DIAGNOSIS — R350 Frequency of micturition: Secondary | ICD-10-CM | POA: Diagnosis not present

## 2018-03-30 DIAGNOSIS — R3915 Urgency of urination: Secondary | ICD-10-CM | POA: Diagnosis not present

## 2018-03-30 DIAGNOSIS — N3941 Urge incontinence: Secondary | ICD-10-CM | POA: Diagnosis not present

## 2018-04-03 ENCOUNTER — Ambulatory Visit (INDEPENDENT_AMBULATORY_CARE_PROVIDER_SITE_OTHER): Payer: MEDICARE

## 2018-04-03 DIAGNOSIS — R55 Syncope and collapse: Secondary | ICD-10-CM | POA: Diagnosis not present

## 2018-04-03 DIAGNOSIS — I639 Cerebral infarction, unspecified: Secondary | ICD-10-CM

## 2018-04-04 LAB — CUP PACEART REMOTE DEVICE CHECK
Date Time Interrogation Session: 20200208210950
Implantable Pulse Generator Implant Date: 20190815

## 2018-04-06 DIAGNOSIS — N400 Enlarged prostate without lower urinary tract symptoms: Secondary | ICD-10-CM | POA: Diagnosis not present

## 2018-04-06 DIAGNOSIS — Z6825 Body mass index (BMI) 25.0-25.9, adult: Secondary | ICD-10-CM | POA: Diagnosis not present

## 2018-04-06 DIAGNOSIS — Z1331 Encounter for screening for depression: Secondary | ICD-10-CM | POA: Diagnosis not present

## 2018-04-06 DIAGNOSIS — I10 Essential (primary) hypertension: Secondary | ICD-10-CM | POA: Diagnosis not present

## 2018-04-06 DIAGNOSIS — R2681 Unsteadiness on feet: Secondary | ICD-10-CM | POA: Diagnosis not present

## 2018-04-06 DIAGNOSIS — R413 Other amnesia: Secondary | ICD-10-CM | POA: Diagnosis not present

## 2018-04-06 DIAGNOSIS — R5383 Other fatigue: Secondary | ICD-10-CM | POA: Diagnosis not present

## 2018-04-10 ENCOUNTER — Other Ambulatory Visit: Payer: Self-pay | Admitting: Cardiovascular Disease

## 2018-04-14 NOTE — Progress Notes (Signed)
Carelink Summary Report / Loop Recorder 

## 2018-04-20 DIAGNOSIS — R3915 Urgency of urination: Secondary | ICD-10-CM | POA: Diagnosis not present

## 2018-04-20 DIAGNOSIS — R350 Frequency of micturition: Secondary | ICD-10-CM | POA: Diagnosis not present

## 2018-05-04 ENCOUNTER — Ambulatory Visit (INDEPENDENT_AMBULATORY_CARE_PROVIDER_SITE_OTHER): Payer: MEDICARE | Admitting: *Deleted

## 2018-05-04 DIAGNOSIS — R55 Syncope and collapse: Secondary | ICD-10-CM

## 2018-05-06 LAB — CUP PACEART REMOTE DEVICE CHECK
Date Time Interrogation Session: 20200312213937
Implantable Pulse Generator Implant Date: 20190815

## 2018-05-11 NOTE — Progress Notes (Signed)
Carelink Summary Report / Loop Recorder 

## 2018-05-22 DIAGNOSIS — M6281 Muscle weakness (generalized): Secondary | ICD-10-CM | POA: Diagnosis not present

## 2018-05-22 DIAGNOSIS — I10 Essential (primary) hypertension: Secondary | ICD-10-CM | POA: Diagnosis not present

## 2018-05-22 DIAGNOSIS — R413 Other amnesia: Secondary | ICD-10-CM | POA: Diagnosis not present

## 2018-05-22 DIAGNOSIS — Z1331 Encounter for screening for depression: Secondary | ICD-10-CM | POA: Diagnosis not present

## 2018-06-06 ENCOUNTER — Ambulatory Visit (INDEPENDENT_AMBULATORY_CARE_PROVIDER_SITE_OTHER): Payer: MEDICARE | Admitting: *Deleted

## 2018-06-06 ENCOUNTER — Other Ambulatory Visit: Payer: Self-pay

## 2018-06-06 DIAGNOSIS — R55 Syncope and collapse: Secondary | ICD-10-CM

## 2018-06-06 LAB — CUP PACEART REMOTE DEVICE CHECK
Date Time Interrogation Session: 20200414205506
Implantable Pulse Generator Implant Date: 20190815

## 2018-06-07 DIAGNOSIS — M6281 Muscle weakness (generalized): Secondary | ICD-10-CM | POA: Diagnosis not present

## 2018-06-07 DIAGNOSIS — R4182 Altered mental status, unspecified: Secondary | ICD-10-CM | POA: Diagnosis not present

## 2018-06-07 DIAGNOSIS — I10 Essential (primary) hypertension: Secondary | ICD-10-CM | POA: Diagnosis not present

## 2018-06-07 DIAGNOSIS — D649 Anemia, unspecified: Secondary | ICD-10-CM | POA: Diagnosis not present

## 2018-06-07 DIAGNOSIS — R251 Tremor, unspecified: Secondary | ICD-10-CM | POA: Diagnosis not present

## 2018-06-07 DIAGNOSIS — E039 Hypothyroidism, unspecified: Secondary | ICD-10-CM | POA: Diagnosis not present

## 2018-06-07 DIAGNOSIS — N183 Chronic kidney disease, stage 3 (moderate): Secondary | ICD-10-CM | POA: Diagnosis not present

## 2018-06-07 DIAGNOSIS — R6889 Other general symptoms and signs: Secondary | ICD-10-CM | POA: Diagnosis not present

## 2018-06-08 DIAGNOSIS — R82998 Other abnormal findings in urine: Secondary | ICD-10-CM | POA: Diagnosis not present

## 2018-06-13 NOTE — Progress Notes (Signed)
Carelink Summary Report / Loop Recorder 

## 2018-06-14 ENCOUNTER — Other Ambulatory Visit: Payer: Self-pay

## 2018-06-14 ENCOUNTER — Emergency Department (HOSPITAL_COMMUNITY)
Admission: EM | Admit: 2018-06-14 | Discharge: 2018-06-14 | Disposition: A | Discharge status: Home / Self Care | Payer: MEDICARE | Source: Home / Self Care | Attending: Emergency Medicine | Admitting: Emergency Medicine

## 2018-06-14 ENCOUNTER — Emergency Department (HOSPITAL_COMMUNITY): Payer: MEDICARE

## 2018-06-14 DIAGNOSIS — Z7982 Long term (current) use of aspirin: Secondary | ICD-10-CM

## 2018-06-14 DIAGNOSIS — R410 Disorientation, unspecified: Secondary | ICD-10-CM | POA: Diagnosis not present

## 2018-06-14 DIAGNOSIS — N183 Chronic kidney disease, stage 3 (moderate): Secondary | ICD-10-CM | POA: Insufficient documentation

## 2018-06-14 DIAGNOSIS — Z87891 Personal history of nicotine dependence: Secondary | ICD-10-CM | POA: Insufficient documentation

## 2018-06-14 DIAGNOSIS — E785 Hyperlipidemia, unspecified: Secondary | ICD-10-CM | POA: Diagnosis not present

## 2018-06-14 DIAGNOSIS — F0390 Unspecified dementia without behavioral disturbance: Secondary | ICD-10-CM

## 2018-06-14 DIAGNOSIS — R531 Weakness: Secondary | ICD-10-CM | POA: Diagnosis not present

## 2018-06-14 DIAGNOSIS — I129 Hypertensive chronic kidney disease with stage 1 through stage 4 chronic kidney disease, or unspecified chronic kidney disease: Secondary | ICD-10-CM

## 2018-06-14 DIAGNOSIS — R55 Syncope and collapse: Secondary | ICD-10-CM | POA: Insufficient documentation

## 2018-06-14 DIAGNOSIS — F0391 Unspecified dementia with behavioral disturbance: Secondary | ICD-10-CM | POA: Diagnosis not present

## 2018-06-14 DIAGNOSIS — Z85820 Personal history of malignant melanoma of skin: Secondary | ICD-10-CM | POA: Insufficient documentation

## 2018-06-14 DIAGNOSIS — Z79899 Other long term (current) drug therapy: Secondary | ICD-10-CM

## 2018-06-14 DIAGNOSIS — E039 Hypothyroidism, unspecified: Secondary | ICD-10-CM | POA: Insufficient documentation

## 2018-06-14 DIAGNOSIS — I10 Essential (primary) hypertension: Secondary | ICD-10-CM | POA: Diagnosis not present

## 2018-06-14 DIAGNOSIS — R4182 Altered mental status, unspecified: Secondary | ICD-10-CM | POA: Diagnosis not present

## 2018-06-14 DIAGNOSIS — Z1159 Encounter for screening for other viral diseases: Secondary | ICD-10-CM | POA: Diagnosis not present

## 2018-06-14 DIAGNOSIS — Z96652 Presence of left artificial knee joint: Secondary | ICD-10-CM

## 2018-06-14 DIAGNOSIS — E871 Hypo-osmolality and hyponatremia: Secondary | ICD-10-CM | POA: Diagnosis not present

## 2018-06-14 DIAGNOSIS — G9341 Metabolic encephalopathy: Secondary | ICD-10-CM | POA: Diagnosis not present

## 2018-06-14 DIAGNOSIS — R4020 Unspecified coma: Secondary | ICD-10-CM | POA: Diagnosis not present

## 2018-06-14 LAB — CBC WITH DIFFERENTIAL/PLATELET
Abs Immature Granulocytes: 0.03 10*3/uL (ref 0.00–0.07)
Basophils Absolute: 0.1 10*3/uL (ref 0.0–0.1)
Basophils Relative: 2 %
Eosinophils Absolute: 0.9 10*3/uL — ABNORMAL HIGH (ref 0.0–0.5)
Eosinophils Relative: 11 %
HCT: 32.6 % — ABNORMAL LOW (ref 39.0–52.0)
Hemoglobin: 11.3 g/dL — ABNORMAL LOW (ref 13.0–17.0)
Immature Granulocytes: 0 %
Lymphocytes Relative: 24 %
Lymphs Abs: 1.9 10*3/uL (ref 0.7–4.0)
MCH: 32.5 pg (ref 26.0–34.0)
MCHC: 34.7 g/dL (ref 30.0–36.0)
MCV: 93.7 fL (ref 80.0–100.0)
Monocytes Absolute: 0.9 10*3/uL (ref 0.1–1.0)
Monocytes Relative: 12 %
Neutro Abs: 4.1 10*3/uL (ref 1.7–7.7)
Neutrophils Relative %: 51 %
Platelets: 241 10*3/uL (ref 150–400)
RBC: 3.48 MIL/uL — ABNORMAL LOW (ref 4.22–5.81)
RDW: 12.7 % (ref 11.5–15.5)
WBC: 7.9 10*3/uL (ref 4.0–10.5)
nRBC: 0 % (ref 0.0–0.2)

## 2018-06-14 LAB — BASIC METABOLIC PANEL
Anion gap: 9 (ref 5–15)
BUN: 17 mg/dL (ref 8–23)
CO2: 23 mmol/L (ref 22–32)
Calcium: 9.3 mg/dL (ref 8.9–10.3)
Chloride: 95 mmol/L — ABNORMAL LOW (ref 98–111)
Creatinine, Ser: 1.26 mg/dL — ABNORMAL HIGH (ref 0.61–1.24)
GFR calc Af Amer: 59 mL/min — ABNORMAL LOW (ref >60)
GFR calc non Af Amer: 51 mL/min — ABNORMAL LOW (ref >60)
Glucose, Bld: 99 mg/dL (ref 70–99)
Potassium: 4.1 mmol/L (ref 3.5–5.1)
Sodium: 127 mmol/L — ABNORMAL LOW (ref 135–145)

## 2018-06-14 LAB — CBG MONITORING, ED: Glucose-Capillary: 97 mg/dL (ref 70–99)

## 2018-06-14 LAB — TSH: TSH: 3.899 u[IU]/mL (ref 0.350–4.500)

## 2018-06-14 NOTE — Discharge Instructions (Addendum)
Your passing out episode is likely due to a vasovagal event.  Your sodium level is low today at 127.  Please have it recheck by your doctor at your earliest convenient.  Return if your condition worsen or if you have other concerns.

## 2018-06-14 NOTE — ED Provider Notes (Signed)
[facility] EMERGENCY DEPARTMENT Provider Note   CSN: [id number] Arrival date & time: 06/14/18  1558    History   Chief Complaint No chief complaint on file.   HPI Randy Sullivan is a 85 y.o. male.     The history is provided by the patient, the spouse and medical records. No language interpreter was used.  Loss of Consciousness  Associated symptoms: weakness   Weakness  Associated symptoms: syncope      85 year old male with history of dementia, recurrent syncope, hypertension, hyperlipidemia, thyroid disease, heart murmur brought here via EMS for evaluation of recent syncope.  History obtained through wife over the phone.  Patient went to the bathroom to shave and then he was found on the ground with an apparent syncopal episode per wife.  She mention patient was sitting on her chest slumped over, she went to get the fall to call 911 and when she returns he was falling down to the ground but she was able to guide him down to the ground without any significant injury.  She does not recall any tremors or seizure-like activity.  She mention patient has had multiple similar episodes of syncope in the past.  She quantified as 6 different episodes in which she has been seen and evaluated without any definitive diagnosis.  She report neurologist does not think he has seizure disorder and cardiologist to perform a loop recorder but did not report any abnormalities.  She mention patient has been came progressively weaker and stiffer, this is an ongoing progress.  Admits that he has history of dementia.  Denies any recent sickness.  Patient has been at home with social isolation does not have any flulike symptoms.  He has been eating and drinking fine.  No change in urinary pattern.    Past Medical History:  Diagnosis Date  . BPH (benign prostatic hyperplasia)   . Cancer ([place])    non melanoma skin cancers removed  . DJD (degenerative joint disease)   . Familial tremor    . GERD (gastroesophageal reflux disease)   . Heart murmur    hx of  . Hyperlipidemia   . Hypertension   . Hypothyroidism   . Mood disorder ([place])   . Osteoarthritis   . Prostatism   . Shortness of breath    mild with excertion  . Sleep apnea    cpap  . Syncope 01/13/2017  . Thyroid disease     Patient Active Problem List   Diagnosis Date Noted  . Thyroid disease   . Sleep apnea   . Shortness of breath   . Prostatism   . Osteoarthritis   . Mood disorder ([place])   . Hypothyroidism   . Hypertension   . Hyperlipidemia   . Heart murmur   . GERD (gastroesophageal reflux disease)   . Familial tremor   . DJD (degenerative joint disease)   . Cancer ([place])   . Seizure ([place]) 01/21/2017  . Transient speech disturbance   . CKD (chronic kidney disease), stage III ([place]) 01/13/2017  . Syncope 01/13/2017  . OA (osteoarthritis) of knee 11/08/2016  . Essential hypertension 02/05/2016  . Thoracic aortic atherosclerosis ([place]) 02/05/2016  . Benign familial tremor 02/05/2016  . COPD (chronic obstructive pulmonary disease) ([place]) 01/21/2016  . OSA (obstructive sleep apnea) 10/23/2015  . Upper airway cough syndrome 10/23/2015  . Exertional dyspnea 10/23/2015  . RLS (restless legs syndrome) 10/23/2015  . Osteoarthritis of knee 06/04/2010  . Dizziness 06/04/2010  .  Benign hypertensive heart disease without heart failure 06/04/2010  . Hypercholesterolemia 06/04/2010  . BPH (benign prostatic hyperplasia) 06/04/2010    Past Surgical History:  Procedure Laterality Date  . APPENDECTOMY    . EYE SURGERY     bil cataracts  . HEMORROIDECTOMY    . INNER EAR SURGERY Right    With impant.   Marland Kitchen LOOP RECORDER INSERTION N/A 10/06/2017   Procedure: LOOP RECORDER INSERTION;  Surgeon: Sanda Klein, MD;  Location: [facility];  Service: Cardiovascular;  Laterality: N/A;  . TONSILLECTOMY    . TOTAL KNEE ARTHROPLASTY Left 11/08/2016   Procedure: LEFT TOTAL KNEE ARTHROPLASTY;  Surgeon: Gaynelle Arabian, MD;  Location: [facility];  Service: Orthopedics;  Laterality: Left;  Adductor Block        Home Medications    Prior to Admission medications   Medication Sig Start Date End Date Taking? Authorizing Provider  acetaminophen (TYLENOL) 500 MG tablet Take 1,000 mg by mouth every 6 (six) hours as needed for moderate pain or headache.    [provider]  aspirin 81 MG tablet Take 81 mg by mouth daily.    [provider]  azithromycin (ZITHROMAX) 250 MG tablet Take 250-500 mg by mouth daily. 500 mg first dose. 250 mg the next 4 days    [provider]  cholecalciferol (VITAMIN D) 1000 units tablet Take 1,000 Units by mouth daily.     [provider]  Cyanocobalamin (HM SUPER VITAMIN B12) 2500 MCG CHEW Chew 2,500 mcg by mouth daily.    [provider]  gabapentin (NEURONTIN) 100 MG capsule Take 100 mg by mouth at bedtime. 10/28/16   [provider]  iron polysaccharides (NIFEREX) 150 MG capsule Take 150 mg by mouth every Tuesday.     [provider]  levothyroxine (SYNTHROID, LEVOTHROID) 112 MCG tablet Take 112 mcg by mouth daily before breakfast.  10/14/14   [provider]  loratadine (CLARITIN) 10 MG tablet Take 10 mg by mouth daily.     [provider]  megestrol (MEGACE) 20 MG tablet Take 40 mg by mouth daily.     [provider]  mirabegron ER (MYRBETRIQ) 50 MG TB24 tablet Take 50 mg by mouth daily.    [provider]  montelukast (SINGULAIR) 10 MG tablet Take 1 tablet (10 mg total) by mouth at bedtime. 11/07/17   Rigoberto Noel, MD  Polyethyl Glycol-Propyl Glycol (SYSTANE OP) Place 1-2 drops into both eyes daily as needed (for dry eyes).     [provider]  propranolol (INDERAL) 20 MG tablet Take 20 mg by mouth daily.  06/18/16   [provider]  rosuvastatin (CRESTOR) 5 MG tablet Take 5 mg by mouth at bedtime.  06/23/16   [provider]  saccharomyces boulardii  (FLORASTOR) 250 MG capsule Take 250 mg by mouth daily.    [provider]  solifenacin (VESICARE) 5 MG tablet Take 5 mg by mouth daily.    [provider]  triamcinolone cream (KENALOG) 0.1 % Apply 1 application topically daily as needed for itching.    [provider]    Family History Family History  Problem Relation Age of Onset  . Heart attack Father   . Colon cancer Mother   . Colon cancer Sister     Social History Social History   Tobacco Use  . Smoking status: Former Smoker    Packs/day: 2.00    Years: 18.00    Pack years: 36.00  Types: Cigarettes    Last attempt to quit: 06/04/1970    Years since quitting: 48.0  . Smokeless tobacco: Never Used  Substance Use Topics  . Alcohol use: Yes    Alcohol/week: 0.0 standard drinks    Comment: special occasions  . Drug use: No     Allergies   Tizanidine   Review of Systems Review of Systems  Cardiovascular: Positive for syncope.  Neurological: Positive for weakness.  All other systems reviewed and are negative.    Physical Exam Updated Vital Signs There were no vitals taken for this visit.  Physical Exam Vitals signs and nursing note reviewed.  Constitutional:      General: He is not in acute distress.    Appearance: He is well-developed.     Comments: Elderly male resting in bed in no acute discomfort.  HENT:     Head: Atraumatic.     Comments: No scalp tenderness no signs of head injury. Eyes:     Extraocular Movements: Extraocular movements intact.     Conjunctiva/sclera: Conjunctivae normal.     Pupils: Pupils are equal, round, and reactive to light.  Neck:     Musculoskeletal: Normal range of motion and neck supple.     Comments: No midline cervical spine tenderness Cardiovascular:     Rate and Rhythm: Normal rate and regular rhythm.     Heart sounds: Murmur present.  Pulmonary:     Effort: Pulmonary effort is normal.     Breath sounds: Normal breath sounds.   Abdominal:     General: Abdomen is flat.     Palpations: Abdomen is soft.     Tenderness: There is no abdominal tenderness.  Musculoskeletal: Normal range of motion.        General: No tenderness.  Skin:    Findings: No rash.  Neurological:     Mental Status: He is alert.     Comments: Alert to self only, not oriented to space time or situation.  Able to move all 4 extremities with equal strength.  Psychiatric:        Mood and Affect: Mood normal.      ED Treatments / Results  Labs (all labs ordered are listed, but only abnormal results are displayed) Labs Reviewed  BASIC METABOLIC PANEL - Abnormal; Notable for the following components:      Result Value   Sodium 127 (*)    Chloride 95 (*)    Creatinine, Ser 1.26 (*)    GFR calc non Af Amer 51 (*)    GFR calc Af Amer 59 (*)    All other components within normal limits  CBC WITH DIFFERENTIAL/PLATELET - Abnormal; Notable for the following components:   RBC 3.48 (*)    Hemoglobin 11.3 (*)    HCT 32.6 (*)    Eosinophils Absolute 0.9 (*)    All other components within normal limits  TSH  CBG MONITORING, ED    EKG EKG Interpretation  Date/Time:  Wednesday June 14 2018 16:23:12 EDT Ventricular Rate:  67 PR Interval:    QRS Duration: 99 QT Interval:  445 QTC Calculation: 470 R Axis:   94 Text Interpretation:  Sinus rhythm Anteroseptal infarct, age indeterminate Artifact in lead(s) I II III aVR aVL aVF V1 V2 Abnormal ekg Confirmed by Carmin Muskrat [phone]) on 06/14/2018 4:26:00 PM   Radiology Ct Head Wo Contrast  Result Date: 06/14/2018 CLINICAL DATA:  86 year old male with syncope EXAM: CT HEAD WITHOUT CONTRAST TECHNIQUE: Contiguous axial images were  obtained from the base of the skull through the vertex without intravenous contrast. COMPARISON:  03/25/2018, 04/15/2017 FINDINGS: Brain: No acute intracranial hemorrhage. No midline shift or mass effect. Gray-white differentiation maintained. Senescent volume loss.  Unremarkable appearance of the ventricular system. Patchy hypodensity in the periventricular white matter. Vascular: Mild intracranial atherosclerosis. Skull: No acute fracture.  No aggressive bone lesion identified. Sinuses/Orbits: Mucosal disease of the sphenoid sinus. Unremarkable orbits Other: None IMPRESSION: No acute intracranial abnormality. Evidence of chronic microvascular ischemic disease. Electronically Signed   By: Corrie Mckusick D.O.   On: 06/14/2018 17:23    Procedures Procedures (including critical care time)  Medications Ordered in ED Medications - No data to display   Initial Impression / Assessment and Plan / ED Course  I have reviewed the triage vital signs and the nursing notes.  Pertinent labs & imaging results that were available during my care of the patient were reviewed by me and considered in my medical decision making (see chart for details).        BP (!) 151/89   Pulse 92   Temp 98.1 F (36.7 C) (Oral)   Resp 18   SpO2 99%    Final Clinical Impressions(s) / ED Diagnoses   Final diagnoses:  Syncope and collapse    ED Discharge Orders    None     4:20 PM Patient with history of recurrent syncope without any definitive diagnosis who is here for another syncopal episode.  He was found by his wife to slumped over in her chair in the bathroom when he was brushing his teeth and shaving.  He does not have any significant signs of injury on initial exam.  With baseline dementia it is difficult to obtain history.  I was able to discuss his care with his wife through the phone.  Work-up initiated.  Wife did mention that patient has been seen and evaluated for his condition multiple times in the past.  States that neurologist does not feel he has history of seizures.  He also has cardiologist to evaluate him and has had loop recorder and without any concerning arrhythmia documented.  6:42 PM Labs are reassuring and mostly at baseline.  Mild hyponatremia with a  sodium of 127, normal thyroid function, normal head CT scan.  When ambulate patient was able to ambulate by himself with a walker without any assist.  I did discuss finding with his wife through the phone.  At this time I felt patient is stable to go home.  Did discuss his low sodium which should be rechecked by PCP next week.  Return precautions discussed.  We also discussed option of admission due to his hyponatremia.  After waiting the risk and benefit we felt due to COVID-19 patient is safer to be at home.  Suspect syncope is likely vasovagal.  Care discussed with DR. Lockwood.    Domenic Moras, PA-C 06/14/18 1847    Carmin Muskrat, MD 06/15/18 [phone]

## 2018-06-14 NOTE — ED Notes (Signed)
Pt able to ambulate with steady gait around nurses station without any assistance ; pt states " I feel good "

## 2018-06-14 NOTE — ED Triage Notes (Addendum)
Pt brought in by ems for c/o syncopal episode today ; per wife, patient was shaving and when she went to check up on him and found him the on floor unconscious; per wife, this has been going on fr years and is nothing new ; wife states that he has been more weak that usual and went to PCP but have not  Gotten any results back ; pt alert and oriented x 1 following simple commands ; hx of dementia; at baseline

## 2018-06-15 ENCOUNTER — Emergency Department (HOSPITAL_COMMUNITY): Payer: MEDICARE

## 2018-06-15 ENCOUNTER — Encounter (HOSPITAL_COMMUNITY): Payer: Self-pay | Admitting: Emergency Medicine

## 2018-06-15 ENCOUNTER — Inpatient Hospital Stay (HOSPITAL_COMMUNITY)
Admission: EM | Admit: 2018-06-15 | Discharge: 2018-06-19 | DRG: 640 | Disposition: A | Discharge status: Hospice | Payer: MEDICARE | Attending: Internal Medicine | Admitting: Internal Medicine

## 2018-06-15 DIAGNOSIS — F0391 Unspecified dementia with behavioral disturbance: Secondary | ICD-10-CM | POA: Diagnosis present

## 2018-06-15 DIAGNOSIS — G9341 Metabolic encephalopathy: Secondary | ICD-10-CM

## 2018-06-15 DIAGNOSIS — G92 Toxic encephalopathy: Secondary | ICD-10-CM | POA: Diagnosis not present

## 2018-06-15 DIAGNOSIS — N183 Chronic kidney disease, stage 3 unspecified: Secondary | ICD-10-CM

## 2018-06-15 DIAGNOSIS — Z7189 Other specified counseling: Secondary | ICD-10-CM | POA: Diagnosis not present

## 2018-06-15 DIAGNOSIS — R4702 Dysphasia: Secondary | ICD-10-CM | POA: Diagnosis present

## 2018-06-15 DIAGNOSIS — I10 Essential (primary) hypertension: Secondary | ICD-10-CM | POA: Diagnosis not present

## 2018-06-15 DIAGNOSIS — Z800 Family history of malignant neoplasm of digestive organs: Secondary | ICD-10-CM

## 2018-06-15 DIAGNOSIS — Z515 Encounter for palliative care: Secondary | ICD-10-CM | POA: Diagnosis present

## 2018-06-15 DIAGNOSIS — Z6825 Body mass index (BMI) 25.0-25.9, adult: Secondary | ICD-10-CM | POA: Diagnosis not present

## 2018-06-15 DIAGNOSIS — R296 Repeated falls: Secondary | ICD-10-CM | POA: Diagnosis present

## 2018-06-15 DIAGNOSIS — R627 Adult failure to thrive: Secondary | ICD-10-CM | POA: Diagnosis present

## 2018-06-15 DIAGNOSIS — I129 Hypertensive chronic kidney disease with stage 1 through stage 4 chronic kidney disease, or unspecified chronic kidney disease: Secondary | ICD-10-CM | POA: Diagnosis present

## 2018-06-15 DIAGNOSIS — M2550 Pain in unspecified joint: Secondary | ICD-10-CM | POA: Diagnosis not present

## 2018-06-15 DIAGNOSIS — R55 Syncope and collapse: Secondary | ICD-10-CM | POA: Diagnosis not present

## 2018-06-15 DIAGNOSIS — G4733 Obstructive sleep apnea (adult) (pediatric): Secondary | ICD-10-CM | POA: Diagnosis present

## 2018-06-15 DIAGNOSIS — R1310 Dysphagia, unspecified: Secondary | ICD-10-CM | POA: Diagnosis present

## 2018-06-15 DIAGNOSIS — Z7401 Bed confinement status: Secondary | ICD-10-CM | POA: Diagnosis not present

## 2018-06-15 DIAGNOSIS — Z66 Do not resuscitate: Secondary | ICD-10-CM | POA: Diagnosis present

## 2018-06-15 DIAGNOSIS — E869 Volume depletion, unspecified: Secondary | ICD-10-CM | POA: Diagnosis present

## 2018-06-15 DIAGNOSIS — Z7982 Long term (current) use of aspirin: Secondary | ICD-10-CM

## 2018-06-15 DIAGNOSIS — J449 Chronic obstructive pulmonary disease, unspecified: Secondary | ICD-10-CM | POA: Diagnosis present

## 2018-06-15 DIAGNOSIS — E785 Hyperlipidemia, unspecified: Secondary | ICD-10-CM | POA: Diagnosis present

## 2018-06-15 DIAGNOSIS — E871 Hypo-osmolality and hyponatremia: Secondary | ICD-10-CM | POA: Diagnosis present

## 2018-06-15 DIAGNOSIS — Z87891 Personal history of nicotine dependence: Secondary | ICD-10-CM | POA: Diagnosis not present

## 2018-06-15 DIAGNOSIS — R0902 Hypoxemia: Secondary | ICD-10-CM | POA: Diagnosis not present

## 2018-06-15 DIAGNOSIS — F0151 Vascular dementia with behavioral disturbance: Secondary | ICD-10-CM | POA: Diagnosis not present

## 2018-06-15 DIAGNOSIS — Z1159 Encounter for screening for other viral diseases: Secondary | ICD-10-CM | POA: Diagnosis not present

## 2018-06-15 DIAGNOSIS — E039 Hypothyroidism, unspecified: Secondary | ICD-10-CM | POA: Diagnosis present

## 2018-06-15 DIAGNOSIS — F03918 Unspecified dementia, unspecified severity, with other behavioral disturbance: Secondary | ICD-10-CM

## 2018-06-15 DIAGNOSIS — T68XXXA Hypothermia, initial encounter: Secondary | ICD-10-CM | POA: Diagnosis not present

## 2018-06-15 DIAGNOSIS — R404 Transient alteration of awareness: Secondary | ICD-10-CM | POA: Diagnosis not present

## 2018-06-15 DIAGNOSIS — R000 Tachycardia, unspecified: Secondary | ICD-10-CM | POA: Diagnosis not present

## 2018-06-15 DIAGNOSIS — Z8249 Family history of ischemic heart disease and other diseases of the circulatory system: Secondary | ICD-10-CM | POA: Diagnosis not present

## 2018-06-15 DIAGNOSIS — R4182 Altered mental status, unspecified: Secondary | ICD-10-CM | POA: Diagnosis not present

## 2018-06-15 DIAGNOSIS — R410 Disorientation, unspecified: Secondary | ICD-10-CM | POA: Diagnosis not present

## 2018-06-15 LAB — DIFFERENTIAL
Abs Immature Granulocytes: 0.03 10*3/uL (ref 0.00–0.07)
Basophils Absolute: 0.2 10*3/uL — ABNORMAL HIGH (ref 0.0–0.1)
Basophils Relative: 2 %
Eosinophils Absolute: 0.8 10*3/uL — ABNORMAL HIGH (ref 0.0–0.5)
Eosinophils Relative: 10 %
Immature Granulocytes: 0 %
Lymphocytes Relative: 28 %
Lymphs Abs: 2.2 10*3/uL (ref 0.7–4.0)
Monocytes Absolute: 1 10*3/uL (ref 0.1–1.0)
Monocytes Relative: 13 %
Neutro Abs: 3.7 10*3/uL (ref 1.7–7.7)
Neutrophils Relative %: 47 %

## 2018-06-15 LAB — CBC
HCT: 33.7 % — ABNORMAL LOW (ref 39.0–52.0)
HCT: 30.7 % — ABNORMAL LOW (ref 39.0–52.0)
Hemoglobin: 11.4 g/dL — ABNORMAL LOW (ref 13.0–17.0)
Hemoglobin: 11 g/dL — ABNORMAL LOW (ref 13.0–17.0)
MCH: 32.2 pg (ref 26.0–34.0)
MCH: 32.6 pg (ref 26.0–34.0)
MCHC: 33.8 g/dL (ref 30.0–36.0)
MCHC: 35.8 g/dL (ref 30.0–36.0)
MCV: 95.2 fL (ref 80.0–100.0)
MCV: 91.1 fL (ref 80.0–100.0)
Platelets: 228 10*3/uL (ref 150–400)
Platelets: 230 10*3/uL (ref 150–400)
RBC: 3.54 MIL/uL — ABNORMAL LOW (ref 4.22–5.81)
RBC: 3.37 MIL/uL — ABNORMAL LOW (ref 4.22–5.81)
RDW: 12.6 % (ref 11.5–15.5)
RDW: 12.6 % (ref 11.5–15.5)
WBC: 8 10*3/uL (ref 4.0–10.5)
WBC: 6.9 10*3/uL (ref 4.0–10.5)
nRBC: 0 % (ref 0.0–0.2)
nRBC: 0 % (ref 0.0–0.2)

## 2018-06-15 LAB — CBG MONITORING, ED: Glucose-Capillary: 113 mg/dL — ABNORMAL HIGH (ref 70–99)

## 2018-06-15 LAB — COMPREHENSIVE METABOLIC PANEL
ALT: 18 U/L (ref 0–44)
AST: 26 U/L (ref 15–41)
Albumin: 4 g/dL (ref 3.5–5.0)
Alkaline Phosphatase: 54 U/L (ref 38–126)
Anion gap: 12 (ref 5–15)
BUN: 14 mg/dL (ref 8–23)
CO2: 20 mmol/L — ABNORMAL LOW (ref 22–32)
Calcium: 9.5 mg/dL (ref 8.9–10.3)
Chloride: 96 mmol/L — ABNORMAL LOW (ref 98–111)
Creatinine, Ser: 1.41 mg/dL — ABNORMAL HIGH (ref 0.61–1.24)
GFR calc Af Amer: 52 mL/min — ABNORMAL LOW (ref >60)
GFR calc non Af Amer: 44 mL/min — ABNORMAL LOW (ref >60)
Glucose, Bld: 124 mg/dL — ABNORMAL HIGH (ref 70–99)
Potassium: 4 mmol/L (ref 3.5–5.1)
Sodium: 128 mmol/L — ABNORMAL LOW (ref 135–145)
Total Bilirubin: 0.9 mg/dL (ref 0.3–1.2)
Total Protein: 6.7 g/dL (ref 6.5–8.1)

## 2018-06-15 LAB — URINALYSIS, ROUTINE W REFLEX MICROSCOPIC
Bilirubin Urine: NEGATIVE
Glucose, UA: NEGATIVE mg/dL
Ketones, ur: NEGATIVE mg/dL
Leukocytes,Ua: NEGATIVE
Nitrite: NEGATIVE
Protein, ur: NEGATIVE mg/dL
Specific Gravity, Urine: <1.005 — ABNORMAL LOW (ref 1.005–1.030)
pH: 7 (ref 5.0–8.0)

## 2018-06-15 LAB — PHOSPHORUS: Phosphorus: 3.6 mg/dL (ref 2.5–4.6)

## 2018-06-15 LAB — RAPID URINE DRUG SCREEN, HOSP PERFORMED
Amphetamines: NOT DETECTED
Barbiturates: NOT DETECTED
Benzodiazepines: NOT DETECTED
Cocaine: NOT DETECTED
Opiates: NOT DETECTED
Tetrahydrocannabinol: NOT DETECTED

## 2018-06-15 LAB — PROTIME-INR
INR: 1 (ref 0.8–1.2)
Prothrombin Time: 13.3 seconds (ref 11.4–15.2)

## 2018-06-15 LAB — CREATININE, SERUM
Creatinine, Ser: 1.25 mg/dL — ABNORMAL HIGH (ref 0.61–1.24)
GFR calc Af Amer: 60 mL/min — ABNORMAL LOW (ref >60)
GFR calc non Af Amer: 51 mL/min — ABNORMAL LOW (ref >60)

## 2018-06-15 LAB — APTT: aPTT: 29 seconds (ref 24–36)

## 2018-06-15 LAB — ETHANOL: Alcohol, Ethyl (B): <10 mg/dL (ref <10)

## 2018-06-15 LAB — MAGNESIUM: Magnesium: 2 mg/dL (ref 1.7–2.4)

## 2018-06-15 LAB — CORTISOL: Cortisol, Plasma: 9.2 ug/dL

## 2018-06-15 LAB — URINALYSIS, MICROSCOPIC (REFLEX)

## 2018-06-15 LAB — OSMOLALITY: Osmolality: 274 mOsm/kg — ABNORMAL LOW (ref 275–295)

## 2018-06-15 LAB — I-STAT CREATININE, ED: Creatinine, Ser: 1.4 mg/dL — ABNORMAL HIGH (ref 0.61–1.24)

## 2018-06-15 MED ORDER — ENOXAPARIN SODIUM 40 MG/0.4ML ~~LOC~~ SOLN
40.0000 mg | SUBCUTANEOUS | Status: DC
  Administered 2018-06-15 – 2018-06-18 (×4): 40 mg via SUBCUTANEOUS
  Filled 2018-06-15 (×4): qty 0.4

## 2018-06-15 MED ORDER — SODIUM CHLORIDE 0.9 % IV BOLUS
500.0000 mL | Freq: Once | INTRAVENOUS | Status: AC
  Administered 2018-06-15: 15:00:00 500 mL via INTRAVENOUS

## 2018-06-15 MED ORDER — VITAMIN D 25 MCG (1000 UNIT) PO TABS
1000.0000 [IU] | ORAL_TABLET | Freq: Every day | ORAL | Status: DC
  Administered 2018-06-16 – 2018-06-19 (×4): 1000 [IU] via ORAL
  Filled 2018-06-15 (×4): qty 1

## 2018-06-15 MED ORDER — POLYSACCHARIDE IRON COMPLEX 150 MG PO CAPS: 150.0000 mg | ORAL_CAPSULE | ORAL | Status: DC

## 2018-06-15 MED ORDER — ASPIRIN EC 81 MG PO TBEC
81.0000 mg | DELAYED_RELEASE_TABLET | Freq: Every day | ORAL | Status: DC
  Administered 2018-06-16 – 2018-06-19 (×4): 81 mg via ORAL
  Filled 2018-06-15 (×4): qty 1

## 2018-06-15 MED ORDER — PANTOPRAZOLE SODIUM 40 MG PO TBEC
40.0000 mg | DELAYED_RELEASE_TABLET | Freq: Every day | ORAL | Status: DC
  Administered 2018-06-16 – 2018-06-19 (×4): 40 mg via ORAL
  Filled 2018-06-15 (×4): qty 1

## 2018-06-15 MED ORDER — LEVOTHYROXINE SODIUM 112 MCG PO TABS
112.0000 ug | ORAL_TABLET | Freq: Every day | ORAL | Status: DC
  Administered 2018-06-16 – 2018-06-19 (×4): 112 ug via ORAL
  Filled 2018-06-15 (×4): qty 1

## 2018-06-15 MED ORDER — MIRABEGRON ER 25 MG PO TB24
50.0000 mg | ORAL_TABLET | Freq: Every evening | ORAL | Status: DC
  Administered 2018-06-17 – 2018-06-18 (×2): 50 mg via ORAL
  Filled 2018-06-15 (×3): qty 2

## 2018-06-15 MED ORDER — VITAMIN B-12 1000 MCG PO TABS
1000.0000 ug | ORAL_TABLET | Freq: Every day | ORAL | Status: DC
  Administered 2018-06-16 – 2018-06-19 (×4): 1000 ug via ORAL
  Filled 2018-06-15 (×4): qty 1

## 2018-06-15 MED ORDER — MONTELUKAST SODIUM 10 MG PO TABS
10.0000 mg | ORAL_TABLET | Freq: Every day | ORAL | Status: DC
  Administered 2018-06-16 – 2018-06-18 (×3): 10 mg via ORAL
  Filled 2018-06-15 (×3): qty 1

## 2018-06-15 MED ORDER — GABAPENTIN 100 MG PO CAPS
100.0000 mg | ORAL_CAPSULE | Freq: Every day | ORAL | Status: DC
  Administered 2018-06-15 – 2018-06-18 (×4): 100 mg via ORAL
  Filled 2018-06-15 (×4): qty 1

## 2018-06-15 MED ORDER — PROPRANOLOL HCL 20 MG PO TABS
20.0000 mg | ORAL_TABLET | Freq: Two times a day (BID) | ORAL | Status: DC
  Administered 2018-06-15 – 2018-06-19 (×8): 20 mg via ORAL
  Filled 2018-06-15 (×9): qty 1

## 2018-06-15 MED ORDER — SODIUM CHLORIDE 0.9 % IV SOLN
INTRAVENOUS | Status: DC
  Administered 2018-06-15 – 2018-06-18 (×4): via INTRAVENOUS

## 2018-06-15 MED ORDER — LORATADINE 10 MG PO TABS
10.0000 mg | ORAL_TABLET | Freq: Every day | ORAL | Status: DC
  Administered 2018-06-15 – 2018-06-18 (×4): 10 mg via ORAL
  Filled 2018-06-15 (×4): qty 1

## 2018-06-15 NOTE — ED Notes (Signed)
ED TO INPATIENT HANDOFF REPORT  ED Nurse Name and Phone #: Nayanna Seaborn, [place] Name/Age/Gender Randy Sullivan 85 y.o. male Room/Bed: 035C/035C  Code Status   Code Status: Prior  Home/SNF/Other Home Patient oriented to: self Is this baseline? unknown  Triage Complete: Triage complete  Chief Complaint stroke  Triage Note [name] sitting with him at the table and he suddenly became unconscious at 1240. EMS arrived, pt more arousalable to painful stimuli/ increasingly more alert per EMS through duration of their care. Pt had left arm/left leg weakness. EMS called code stroke. Pt more altered than baseline. BP 154/100, HR 70. 99% room air.    Allergies Allergies  Allergen Reactions  . Tizanidine Other (See Comments)    Hypotension     Level of Care/Admitting Diagnosis ED Disposition    ED Disposition Condition [facility] Area: [place] [[id number]]  Level of Care: Med-Surg [16]  Covid Evaluation: N/A  Diagnosis: Hyponatremia [672094]  Admitting Physician: Shirlean Mylar  Attending Physician: Dana Allan I [[id number]]  Estimated length of stay: past midnight tomorrow  Certification:: I certify this patient will need inpatient services for at least 2 midnights  PT Class (Do Not Modify): Inpatient [101]  PT Acc Code (Do Not Modify): Private [1]       B Medical/Surgery History Past Medical History:  Diagnosis Date  . BPH (benign prostatic hyperplasia)   . Cancer ([place])    non melanoma skin cancers removed  . DJD (degenerative joint disease)   . Familial tremor   . GERD (gastroesophageal reflux disease)   . Heart murmur    hx of  . Hyperlipidemia   . Hypertension   . Hypothyroidism   . Mood disorder ([place])   . Osteoarthritis   . Prostatism   . Shortness of breath    mild with excertion  . Sleep apnea    cpap  . Syncope 01/13/2017  . Thyroid disease    Past Surgical History:  Procedure Laterality Date  .  APPENDECTOMY    . EYE SURGERY     bil cataracts  . HEMORROIDECTOMY    . INNER EAR SURGERY Right    With impant.   Marland Kitchen LOOP RECORDER INSERTION N/A 10/06/2017   Procedure: LOOP RECORDER INSERTION;  Surgeon: Sanda Klein, MD;  Location: [facility];  Service: Cardiovascular;  Laterality: N/A;  . TONSILLECTOMY    . TOTAL KNEE ARTHROPLASTY Left 11/08/2016   Procedure: LEFT TOTAL KNEE ARTHROPLASTY;  Surgeon: Gaynelle Arabian, MD;  Location: [facility];  Service: Orthopedics;  Laterality: Left;  Adductor Block     A IV Location/Drains/Wounds Patient Lines/Drains/Airways Status   Active Line/Drains/Airways    Name:   Placement date:   Placement time:   Site:   Days:   Peripheral IV 06/14/18 Left Antecubital   06/14/18    1618    Antecubital   1   Peripheral IV 06/15/18 Right Antecubital   06/15/18    1418    Antecubital   less than 1   External Urinary Catheter   11/10/16    2030    -   582   External Urinary Catheter   03/25/18    0746    -   82   Incision (Closed) 11/08/16 Knee Left   11/08/16    0959     584          Intake/Output Last 24 hours No intake or output  data in the 24 hours ending 06/15/18 1610  Labs/Imaging Results for orders placed or performed during the hospital encounter of 06/15/18 (from the past 48 hour(s))  CBG monitoring, ED     Status: Abnormal   Collection Time: 06/15/18  1:56 PM  Result Value Ref Range   Glucose-Capillary 113 (H) 70 - 99 mg/dL   Comment 1 Notify RN    Comment 2 Document in Chart   Ethanol     Status: None   Collection Time: 06/15/18  1:59 PM  Result Value Ref Range   Alcohol, Ethyl (B) <10 <10 mg/dL    Comment: (NOTE) Lowest detectable limit for serum alcohol is 10 mg/dL. For medical purposes only. Performed at [facility], [place] [address], [place], [place] [address]   Protime-INR     Status: None   Collection Time: 06/15/18  1:59 PM  Result Value Ref Range   Prothrombin Time 13.3 11.4 - 15.2 seconds   INR 1.0 0.8 - 1.2     Comment: (NOTE) INR goal varies based on device and disease states. Performed at [facility], [place] [address], [place], [place] [address]   APTT     Status: None   Collection Time: 06/15/18  1:59 PM  Result Value Ref Range   aPTT 29 24 - 36 seconds    Comment: Performed at [facility], [place] [address], [place], [place] [address]  CBC     Status: Abnormal   Collection Time: 06/15/18  1:59 PM  Result Value Ref Range   WBC 8.0 4.0 - 10.5 K/uL   RBC 3.54 (L) 4.22 - 5.81 MIL/uL   Hemoglobin 11.4 (L) 13.0 - 17.0 g/dL   HCT 33.7 (L) 39.0 - 52.0 %   MCV 95.2 80.0 - 100.0 fL   MCH 32.2 26.0 - 34.0 pg   MCHC 33.8 30.0 - 36.0 g/dL   RDW 12.6 11.5 - 15.5 %   Platelets 228 150 - 400 K/uL   nRBC 0.0 0.0 - 0.2 %    Comment: Performed at [facility], [place] [address] [place], [place] [address]  Differential     Status: Abnormal   Collection Time: 06/15/18  1:59 PM  Result Value Ref Range   Neutrophils Relative % 47 %   Neutro Abs 3.7 1.7 - 7.7 K/uL   Lymphocytes Relative 28 %   Lymphs Abs 2.2 0.7 - 4.0 K/uL   Monocytes Relative 13 %   Monocytes Absolute 1.0 0.1 - 1.0 K/uL   Eosinophils Relative 10 %   Eosinophils Absolute 0.8 (H) 0.0 - 0.5 K/uL   Basophils Relative 2 %   Basophils Absolute 0.2 (H) 0.0 - 0.1 K/uL   Immature Granulocytes 0 %   Abs Immature Granulocytes 0.03 0.00 - 0.07 K/uL    Comment: Performed at [facility] [address], [place], [place] [address]  Comprehensive metabolic panel     Status: Abnormal   Collection Time: 06/15/18  1:59 PM  Result Value Ref Range   Sodium 128 (L) 135 - 145 mmol/L   Potassium 4.0 3.5 - 5.1 mmol/L   Chloride 96 (L) 98 - 111 mmol/L   CO2 20 (L) 22 - 32 mmol/L   Glucose, Bld 124 (H) 70 - 99 mg/dL   BUN 14 8 - 23 mg/dL   Creatinine, Ser 1.41 (H) 0.61 - 1.24 mg/dL   Calcium 9.5 8.9 - 10.3 mg/dL   Total Protein 6.7 6.5 -  8.1 g/dL   Albumin 4.0 3.5 - 5.0 g/dL   AST 26 15 - 41 U/L   ALT 18 0 - 44 U/L   Alkaline  Phosphatase 54 38 - 126 U/L   Total Bilirubin 0.9 0.3 - 1.2 mg/dL   GFR calc non Af Amer 44 (L) >60 mL/min   GFR calc Af Amer 52 (L) >60 mL/min   Anion gap 12 5 - 15    Comment: Performed at [facility] [address] [place], [place] [address]  I-stat Creatinine, ED     Status: Abnormal   Collection Time: 06/15/18  2:03 PM  Result Value Ref Range   Creatinine, Ser 1.40 (H) 0.61 - 1.24 mg/dL  Urine rapid drug screen (hosp performed)     Status: None   Collection Time: 06/15/18  3:13 PM  Result Value Ref Range   Opiates NONE DETECTED NONE DETECTED   Cocaine NONE DETECTED NONE DETECTED   Benzodiazepines NONE DETECTED NONE DETECTED   Amphetamines NONE DETECTED NONE DETECTED   Tetrahydrocannabinol NONE DETECTED NONE DETECTED   Barbiturates NONE DETECTED NONE DETECTED    Comment: (NOTE) DRUG SCREEN FOR MEDICAL PURPOSES ONLY.  IF CONFIRMATION IS NEEDED FOR ANY PURPOSE, NOTIFY LAB WITHIN 5 DAYS. LOWEST DETECTABLE LIMITS FOR URINE DRUG SCREEN Drug Class                     Cutoff (ng/mL) Amphetamine and metabolites    1000 Barbiturate and metabolites    200 Benzodiazepine                 540 Tricyclics and metabolites     300 Opiates and metabolites        300 Cocaine and metabolites        300 THC                            50 Performed at [facility], [place] [address], [place], [place] [address]   Urinalysis, Routine w reflex microscopic     Status: Abnormal   Collection Time: 06/15/18  3:13 PM  Result Value Ref Range   Color, Urine YELLOW YELLOW   APPearance CLEAR CLEAR   Specific Gravity, Urine <1.005 (L) 1.005 - 1.030   pH 7.0 5.0 - 8.0   Glucose, UA NEGATIVE NEGATIVE mg/dL   Hgb urine dipstick TRACE (A) NEGATIVE   Bilirubin Urine NEGATIVE NEGATIVE   Ketones, ur NEGATIVE NEGATIVE mg/dL   Protein, ur NEGATIVE NEGATIVE mg/dL   Nitrite NEGATIVE NEGATIVE   Leukocytes,Ua NEGATIVE NEGATIVE    Comment: Performed at [facility] [address],  [place], [place] [address]  Urinalysis, Microscopic (reflex)     Status: Abnormal   Collection Time: 06/15/18  3:13 PM  Result Value Ref Range   RBC / HPF 0-5 0 - 5 RBC/hpf   WBC, UA 0-5 0 - 5 WBC/hpf   Bacteria, UA RARE (A) NONE SEEN   Squamous Epithelial / LPF 0-5 0 - 5    Comment: Performed at [facility], [place] [address], [place], [place] [address]   Ct Head Wo Contrast  Result Date: 06/14/2018 CLINICAL DATA:  85 year old male with syncope EXAM: CT HEAD WITHOUT CONTRAST TECHNIQUE: Contiguous axial images were obtained from the base of the skull through the vertex without intravenous contrast. COMPARISON:  03/25/2018, 04/15/2017 FINDINGS: Brain: No acute intracranial hemorrhage. No midline shift or mass  effect. Gray-white differentiation maintained. Senescent volume loss. Unremarkable appearance of the ventricular system. Patchy hypodensity in the periventricular white matter. Vascular: Mild intracranial atherosclerosis. Skull: No acute fracture.  No aggressive bone lesion identified. Sinuses/Orbits: Mucosal disease of the sphenoid sinus. Unremarkable orbits Other: None IMPRESSION: No acute intracranial abnormality. Evidence of chronic microvascular ischemic disease. Electronically Signed   By: Corrie Mckusick D.O.   On: 06/14/2018 17:23   Ct Head Code Stroke Wo Contrast  Result Date: 06/15/2018 CLINICAL DATA:  Code stroke. Left arm weakness and drift. Altered mental status. EXAM: CT HEAD WITHOUT CONTRAST TECHNIQUE: Contiguous axial images were obtained from the base of the skull through the vertex without intravenous contrast. COMPARISON:  06/14/2018 FINDINGS: Brain: There is no evidence of acute infarct, intracranial hemorrhage, mass, midline shift, or extra-axial fluid collection. Moderate cerebral atrophy is again noted. Patchy cerebral white matter hypodensities are unchanged and nonspecific but compatible with moderate chronic small vessel ischemic disease. Small chronic infarcts are again  seen in the right thalamus and right cerebellum. Vascular: No hyperdense vessel. Skull: No fracture or focal osseous lesion. Sinuses/Orbits: Mild left sphenoid and left maxillary sinus mucosal thickening. Clear mastoid air cells. Bilateral cataract extraction. Other: None. ASPECTS [facility] Stroke Program Early CT Score) - Ganglionic level infarction (caudate, lentiform nuclei, internal capsule, insula, M1-M3 cortex): 7 - Supraganglionic infarction (M4-M6 cortex): 3 Total score (0-10 with 10 being normal): 10 IMPRESSION: 1. No evidence of acute intracranial abnormality. 2. ASPECTS is 10. 3. Moderate chronic small vessel ischemic disease and cerebral atrophy. These results were communicated to Dr. Rory Percy at 2:16 pm on 06/15/2018 by text page via the [facility] messaging system. Electronically Signed   By: Logan Bores M.D.   On: 06/15/2018 14:18    Pending Labs Unresulted Labs (From admission, onward)    Start     Ordered   06/15/18 1456  Urine culture  ONCE - STAT,   STAT     06/15/18 1455          Vitals/Pain Today's Vitals   06/15/18 1415 06/15/18 1430 06/15/18 1430 06/15/18 1500  BP:  (!) 140/93 134/76 (!) 147/80  Pulse:  73 73 77  Resp:  19 19 17   Temp:   98.1 F (36.7 C)   TempSrc:   Oral   SpO2:  98% 99% 100%  Weight: 84.5 kg     PainSc: 0-No pain       Isolation Precautions No active isolations  Medications Medications  sodium chloride 0.9 % bolus 500 mL (500 mLs Intravenous New Bag/Given 06/15/18 1446)    Mobility walks with person assist     Focused Assessments Neuro Assessment Handoff:  Swallow screen pass? not done   NIH Stroke Scale ( + Modified Stroke Scale Criteria)  Interval: Initial Level of Consciousness (1a.)   : Alert, keenly responsive LOC Questions (1b. )   +: Answers neither question correctly LOC Commands (1c. )   + : Performs both tasks correctly Best Gaze (2. )  +: Normal Visual (3. )  +: No visual loss Facial Palsy (4. )    : Normal symmetrical  movements Motor Arm, Left (5a. )   +: No drift Motor Arm, Right (5b. )   +: No drift Motor Leg, Left (6a. )   +: No drift Motor Leg, Right (6b. )   +: No drift Limb Ataxia (7. ): Absent Sensory (8. )   +: Normal, no sensory loss Best Language (9. )   +: No aphasia  Dysarthria (10. ): Mild-to-moderate dysarthria, patient slurs at least some words and, at worst, can be understood with some difficulty Extinction/Inattention (11.)   +: No Abnormality Modified SS Total  +: 2 Complete NIHSS TOTAL: 3 Last date known well: 06/15/18 Last time known well: 1240 Neuro Assessment: Exceptions to WDL Neuro Checks:   Initial (06/15/18 1427)  Last Documented NIHSS Modified Score: 2 (06/15/18 1427) Has TPA been given? No If patient is a Neuro Trauma and patient is going to OR before floor call report to [place] nurse: [phone] or [phone]     R Recommendations: See Admitting Provider Note  Report given to:   Additional Notes:

## 2018-06-15 NOTE — ED Provider Notes (Signed)
[facility] EMERGENCY DEPARTMENT Provider Note   CSN: [id number] Arrival date & time: 06/15/18  1353    History   Chief Complaint Chief Complaint  Patient presents with  . Code Stroke    HPI Randy Sullivan is a 85 y.o. male.     The history is provided by the patient, the EMS personnel and medical records. No language interpreter was used.   Randy Sullivan is a 85 y.o. male who presents to the Emergency Department complaining of AMS.  Level V caveat due to AMS. History is provided by EMS. The patient presents as a code stroke for left sided deficits. He was last known to be normal about 1230 today. His wife noticed that he was less responsive and altered. On EMS arrival he was found to be confused with left-sided weakness. Symptoms are severe and constant nature. Past Medical History:  Diagnosis Date  . BPH (benign prostatic hyperplasia)   . Cancer ([place])    non melanoma skin cancers removed  . DJD (degenerative joint disease)   . Familial tremor   . GERD (gastroesophageal reflux disease)   . Heart murmur    hx of  . Hyperlipidemia   . Hypertension   . Hypothyroidism   . Mood disorder ([place])   . Osteoarthritis   . Prostatism   . Shortness of breath    mild with excertion  . Sleep apnea    cpap  . Syncope 01/13/2017  . Thyroid disease     Patient Active Problem List   Diagnosis Date Noted  . Thyroid disease   . Sleep apnea   . Shortness of breath   . Prostatism   . Osteoarthritis   . Mood disorder ([place])   . Hypothyroidism   . Hypertension   . Hyperlipidemia   . Heart murmur   . GERD (gastroesophageal reflux disease)   . Familial tremor   . DJD (degenerative joint disease)   . Cancer ([place])   . Seizure ([place]) 01/21/2017  . Transient speech disturbance   . CKD (chronic kidney disease), stage III ([place]) 01/13/2017  . Syncope 01/13/2017  . OA (osteoarthritis) of knee 11/08/2016  . Essential hypertension 02/05/2016  . Thoracic aortic  atherosclerosis ([place]) 02/05/2016  . Benign familial tremor 02/05/2016  . COPD (chronic obstructive pulmonary disease) ([place]) 01/21/2016  . OSA (obstructive sleep apnea) 10/23/2015  . Upper airway cough syndrome 10/23/2015  . Exertional dyspnea 10/23/2015  . RLS (restless legs syndrome) 10/23/2015  . Osteoarthritis of knee 06/04/2010  . Dizziness 06/04/2010  . Benign hypertensive heart disease without heart failure 06/04/2010  . Hypercholesterolemia 06/04/2010  . BPH (benign prostatic hyperplasia) 06/04/2010    Past Surgical History:  Procedure Laterality Date  . APPENDECTOMY    . EYE SURGERY     bil cataracts  . HEMORROIDECTOMY    . INNER EAR SURGERY Right    With impant.   Marland Kitchen LOOP RECORDER INSERTION N/A 10/06/2017   Procedure: LOOP RECORDER INSERTION;  Surgeon: Sanda Klein, MD;  Location: [facility];  Service: Cardiovascular;  Laterality: N/A;  . TONSILLECTOMY    . TOTAL KNEE ARTHROPLASTY Left 11/08/2016   Procedure: LEFT TOTAL KNEE ARTHROPLASTY;  Surgeon: Gaynelle Arabian, MD;  Location: [facility];  Service: Orthopedics;  Laterality: Left;  Adductor Block        Home Medications    Prior to Admission medications   Medication Sig Start Date End Date Taking? Authorizing Provider  acetaminophen (TYLENOL) 500 MG tablet  Take 1,000 mg by mouth 2 (two) times daily with a meal.     [provider]  aspirin 81 MG tablet Take 81 mg by mouth daily.    [provider]  Cholecalciferol (VITAMIN D-3) 25 MCG (1000 UT) CAPS Take 1,000 Units by mouth daily.    [provider]  gabapentin (NEURONTIN) 100 MG capsule Take 100 mg by mouth at bedtime. 10/28/16   [provider]  iron polysaccharides (NIFEREX) 150 MG capsule Take 150 mg by mouth every Tuesday.     [provider]  levothyroxine (SYNTHROID, LEVOTHROID) 112 MCG tablet Take 112 mcg by mouth daily before breakfast.  10/14/14   [provider]  loratadine (CLARITIN) 10 MG tablet  Take 10 mg by mouth at bedtime.     [provider]  mirabegron ER (MYRBETRIQ) 50 MG TB24 tablet Take 50 mg by mouth every evening.     [provider]  montelukast (SINGULAIR) 10 MG tablet Take 1 tablet (10 mg total) by mouth at bedtime. 11/07/17   Rigoberto Noel, MD  omeprazole (PRILOSEC) 20 MG capsule Take 20 mg by mouth daily.    [provider]  Polyethyl Glycol-Propyl Glycol (SYSTANE OP) Place 1-2 drops into both eyes as needed (for dry eyes).     [provider]  propranolol (INDERAL) 20 MG tablet Take 20 mg by mouth 2 (two) times daily.  06/18/16   [provider]  saccharomyces boulardii (FLORASTOR) 250 MG capsule Take 250 mg by mouth at bedtime.     [provider]  triamcinolone cream (KENALOG) 0.1 % Apply 1 application topically daily as needed for itching.    [provider]  vitamin B-12 (CYANOCOBALAMIN) 1000 MCG tablet Take 1,000 mcg by mouth daily.    [provider]    Family History Family History  Problem Relation Age of Onset  . Heart attack Father   . Colon cancer Mother   . Colon cancer Sister     Social History Social History   Tobacco Use  . Smoking status: Former Smoker    Packs/day: 2.00    Years: 18.00    Pack years: 36.00    Types: Cigarettes    Last attempt to quit: 06/04/1970    Years since quitting: 48.0  . Smokeless tobacco: Never Used  Substance Use Topics  . Alcohol use: Yes    Alcohol/week: 0.0 standard drinks    Comment: special occasions  . Drug use: No     Allergies   Tizanidine   Review of Systems Review of Systems  All other systems reviewed and are negative.    Physical Exam Updated Vital Signs BP (!) 147/80   Pulse 77   Temp 98.1 F (36.7 C) (Oral)   Resp 17   Wt 84.5 kg   SpO2 100%   BMI 25.27 kg/m   Physical Exam Vitals signs and nursing note reviewed.  Constitutional:      Appearance: He is well-developed.  HENT:     Head: Normocephalic  and atraumatic.  Cardiovascular:     Rate and Rhythm: Normal rate and regular rhythm.  Pulmonary:     Effort: Pulmonary effort is normal. No respiratory distress.  Abdominal:     Palpations: Abdomen is soft.     Tenderness: There is no abdominal tenderness. There is no guarding or rebound.  Musculoskeletal:        General: No swelling or tenderness.  Skin:    General: Skin is  warm and dry.  Neurological:     Mental Status: He is alert.     Comments: Alert.  Disoriented to place and time. Five out of five strength in all four extremities with sensation to light touch intact in all four extremities. Confused.  Slow to answer questions.    Psychiatric:        Behavior: Behavior normal.      ED Treatments / Results  Labs (all labs ordered are listed, but only abnormal results are displayed) Labs Reviewed  CBC - Abnormal; Notable for the following components:      Result Value   RBC 3.54 (*)    Hemoglobin 11.4 (*)    HCT 33.7 (*)    All other components within normal limits  DIFFERENTIAL - Abnormal; Notable for the following components:   Eosinophils Absolute 0.8 (*)    Basophils Absolute 0.2 (*)    All other components within normal limits  COMPREHENSIVE METABOLIC PANEL - Abnormal; Notable for the following components:   Sodium 128 (*)    Chloride 96 (*)    CO2 20 (*)    Glucose, Bld 124 (*)    Creatinine, Ser 1.41 (*)    GFR calc non Af Amer 44 (*)    GFR calc Af Amer 52 (*)    All other components within normal limits  URINALYSIS, ROUTINE W REFLEX MICROSCOPIC - Abnormal; Notable for the following components:   Specific Gravity, Urine <1.005 (*)    Hgb urine dipstick TRACE (*)    All other components within normal limits  URINALYSIS, MICROSCOPIC (REFLEX) - Abnormal; Notable for the following components:   Bacteria, UA RARE (*)    All other components within normal limits  I-STAT CREATININE, ED - Abnormal; Notable for the following components:   Creatinine, Ser 1.40  (*)    All other components within normal limits  CBG MONITORING, ED - Abnormal; Notable for the following components:   Glucose-Capillary 113 (*)    All other components within normal limits  URINE CULTURE  ETHANOL  PROTIME-INR  APTT  RAPID URINE DRUG SCREEN, HOSP PERFORMED    EKG None  Radiology Ct Head Wo Contrast  Result Date: 06/14/2018 CLINICAL DATA:  85 year old male with syncope EXAM: CT HEAD WITHOUT CONTRAST TECHNIQUE: Contiguous axial images were obtained from the base of the skull through the vertex without intravenous contrast. COMPARISON:  03/25/2018, 04/15/2017 FINDINGS: Brain: No acute intracranial hemorrhage. No midline shift or mass effect. Gray-white differentiation maintained. Senescent volume loss. Unremarkable appearance of the ventricular system. Patchy hypodensity in the periventricular white matter. Vascular: Mild intracranial atherosclerosis. Skull: No acute fracture.  No aggressive bone lesion identified. Sinuses/Orbits: Mucosal disease of the sphenoid sinus. Unremarkable orbits Other: None IMPRESSION: No acute intracranial abnormality. Evidence of chronic microvascular ischemic disease. Electronically Signed   By: Corrie Mckusick D.O.   On: 06/14/2018 17:23   Ct Head Code Stroke Wo Contrast  Result Date: 06/15/2018 CLINICAL DATA:  Code stroke. Left arm weakness and drift. Altered mental status. EXAM: CT HEAD WITHOUT CONTRAST TECHNIQUE: Contiguous axial images were obtained from the base of the skull through the vertex without intravenous contrast. COMPARISON:  06/14/2018 FINDINGS: Brain: There is no evidence of acute infarct, intracranial hemorrhage, mass, midline shift, or extra-axial fluid collection. Moderate cerebral atrophy is again noted. Patchy cerebral white matter hypodensities are unchanged and nonspecific but compatible with moderate chronic small vessel ischemic disease. Small chronic infarcts are again seen in the right thalamus and right cerebellum.  Vascular: No hyperdense vessel. Skull: No fracture or focal osseous lesion. Sinuses/Orbits: Mild left sphenoid and left maxillary sinus mucosal thickening. Clear mastoid air cells. Bilateral cataract extraction. Other: None. ASPECTS [facility] Stroke Program Early CT Score) - Ganglionic level infarction (caudate, lentiform nuclei, internal capsule, insula, M1-M3 cortex): 7 - Supraganglionic infarction (M4-M6 cortex): 3 Total score (0-10 with 10 being normal): 10 IMPRESSION: 1. No evidence of acute intracranial abnormality. 2. ASPECTS is 10. 3. Moderate chronic small vessel ischemic disease and cerebral atrophy. These results were communicated to Dr. Rory Percy at 2:16 pm on 06/15/2018 by text page via the [facility] messaging system. Electronically Signed   By: Logan Bores M.D.   On: 06/15/2018 14:18    Procedures Procedures (including critical care time)  Medications Ordered in ED Medications  sodium chloride 0.9 % bolus 500 mL (500 mLs Intravenous New Bag/Given 06/15/18 1446)     Initial Impression / Assessment and Plan / ED Course  I have reviewed the triage vital signs and the nursing notes.  Pertinent labs & imaging results that were available during my care of the patient were reviewed by me and considered in my medical decision making (see chart for details).        Patient presented as a code stroke for altered mental status and left-sided deficits. Code stroke was canceled after patient's initial ED evaluation. He is confused on examination without focal neurologic deficits. He was evaluated by neurology. Labs are significant for hyponatremia, which is stable when compared to yesterday's labs but new when compared to two months ago. Wife is unable to take the patient back at home due to his inability to ambulate and worsening status. Patient care transferred pending urinalysis. Social work consulted regarding SNF placement.   Final Clinical Impressions(s) / ED Diagnoses   Final diagnoses:  None     ED Discharge Orders    None       Quintella Reichert, MD 06/15/18 1549

## 2018-06-15 NOTE — ED Notes (Addendum)
Spoke with patients wife Randy Sullivan regarding plan of care to admit patient and seek SNF placement due to patients progressive weakness and inability to walk. Notified wife that code stroke was cancelled and not suspected by neurology. Neurology team on the phone with wife as well to update her of same. Wife, agreeable and thanked this Probation officer for the update and for everyone being so helpful today.

## 2018-06-15 NOTE — H&P (Signed)
History and Physical  DARYL BEEHLER TZG:[id number] DOB: [birth year] DOA: 06/15/2018  Referring physician: Provider PCP: Prince Solian, MD  Outpatient Specialists:    Patient coming from: Home  Chief Complaint: Worsening confusion, weakness and low sodium (128).  HPI:  Patient is an 85 year old male with history of dementia, recurrent syncope, hypertension, hyperlipidemia, thyroid disease, heart murmur and documented OSA amongst other medical problems.  Dated dementing illness, the patient could not give any significant history.  Most of the history have come from the ER provider on prior documentations.  Apparently, patient was seen here yesterday for syncope and hyponatremia.  Patient was discharged back home yesterday.  Patient is brought back to the ER today by the EMS with worsening confusion, altered mental status, progressive weakness and was felt to be less responsive at some point.  Code stroke was initially called, but this was canceled by the neurologist, Dr. Malen Gauze.  Hyponatremia persists.  No documented fever or chills, no reported headache or neck pain, no reported chest pain or shortness of breath, no reported GI symptoms or urinary symptoms.  Patient be admitted for further assessment and management.  ED Course: Volume resuscitation.  Lab work done.  Persistent hyponatremia noted.  Pertinent labs: Chemistry reveals sodium of 120, potassium of 4, chloride 96, CO2 20, BUN of 14, creatinine of 1.4 with blood sugar of 124.  C reveals WBC of 8, hemoglobin of 11.4, hematocrit of 33.7, MCV of 95.2 platelet count of 228.  EKG: Independently reviewed.   Imaging: independently reviewed.   Review of Systems: unobtainable.  Past Medical History:  Diagnosis Date  . BPH (benign prostatic hyperplasia)   . Cancer ([place])    non melanoma skin cancers removed  . DJD (degenerative joint disease)   . Familial tremor   . GERD (gastroesophageal reflux disease)   . Heart murmur    hx of  .  Hyperlipidemia   . Hypertension   . Hypothyroidism   . Mood disorder ([place])   . Osteoarthritis   . Prostatism   . Shortness of breath    mild with excertion  . Sleep apnea    cpap  . Syncope 01/13/2017  . Thyroid disease     Past Surgical History:  Procedure Laterality Date  . APPENDECTOMY    . EYE SURGERY     bil cataracts  . HEMORROIDECTOMY    . INNER EAR SURGERY Right    With impant.   Marland Kitchen LOOP RECORDER INSERTION N/A 10/06/2017   Procedure: LOOP RECORDER INSERTION;  Surgeon: Sanda Klein, MD;  Location: [facility];  Service: Cardiovascular;  Laterality: N/A;  . TONSILLECTOMY    . TOTAL KNEE ARTHROPLASTY Left 11/08/2016   Procedure: LEFT TOTAL KNEE ARTHROPLASTY;  Surgeon: Gaynelle Arabian, MD;  Location: [facility];  Service: Orthopedics;  Laterality: Left;  Adductor Block     reports that he quit smoking about 48 years ago. His smoking use included cigarettes. He has a 36.00 pack-year smoking history. He has never used smokeless tobacco. He reports current alcohol use. He reports that he does not use drugs.  Allergies  Allergen Reactions  . Tizanidine Other (See Comments)    Hypotension     Family History  Problem Relation Age of Onset  . Heart attack Father   . Colon cancer Mother   . Colon cancer Sister      Prior to Admission medications   Medication Sig Start Date End Date Taking? Authorizing Provider  acetaminophen (TYLENOL) 500 MG tablet  Take 1,000 mg by mouth 2 (two) times daily with a meal.     [provider]  aspirin 81 MG tablet Take 81 mg by mouth daily.    [provider]  Cholecalciferol (VITAMIN D-3) 25 MCG (1000 UT) CAPS Take 1,000 Units by mouth daily.    [provider]  gabapentin (NEURONTIN) 100 MG capsule Take 100 mg by mouth at bedtime. 10/28/16   [provider]  iron polysaccharides (NIFEREX) 150 MG capsule Take 150 mg by mouth every Tuesday.     [provider]  levothyroxine (SYNTHROID,  LEVOTHROID) 112 MCG tablet Take 112 mcg by mouth daily before breakfast.  10/14/14   [provider]  loratadine (CLARITIN) 10 MG tablet Take 10 mg by mouth at bedtime.     [provider]  mirabegron ER (MYRBETRIQ) 50 MG TB24 tablet Take 50 mg by mouth every evening.     [provider]  montelukast (SINGULAIR) 10 MG tablet Take 1 tablet (10 mg total) by mouth at bedtime. 11/07/17   Rigoberto Noel, MD  omeprazole (PRILOSEC) 20 MG capsule Take 20 mg by mouth daily.    [provider]  Polyethyl Glycol-Propyl Glycol (SYSTANE OP) Place 1-2 drops into both eyes as needed (for dry eyes).     [provider]  propranolol (INDERAL) 20 MG tablet Take 20 mg by mouth 2 (two) times daily.  06/18/16   [provider]  saccharomyces boulardii (FLORASTOR) 250 MG capsule Take 250 mg by mouth at bedtime.     [provider]  triamcinolone cream (KENALOG) 0.1 % Apply 1 application topically daily as needed for itching.    [provider]  vitamin B-12 (CYANOCOBALAMIN) 1000 MCG tablet Take 1,000 mcg by mouth daily.    [provider]    Physical Exam: Vitals:   06/15/18 1415 06/15/18 1430 06/15/18 1430 06/15/18 1500  BP:  (!) 140/93 134/76 (!) 147/80  Pulse:  73 73 77  Resp:  19 19 17   Temp:   98.1 F (36.7 C)   TempSrc:   Oral   SpO2:  98% 99% 100%  Weight: 84.5 kg        Constitutional:  . Appears calm and comfortable Eyes:  Marland Kitchen Mild pallor. No jaundice.  ENMT:  . external ears, nose appear normal.  Dry buccal mucosa. Neck:  . Neck is supple. No JVD Respiratory:  . CTA bilaterally, no w/r/r.  . Respiratory effort normal. No retractions or accessory muscle use Cardiovascular:  . S1S2 . No LE extremity edema   Abdomen:  . Abdomen is soft and non tender. Organs are difficult to assess. Neurologic:  . Awake and alert. . Moves all limbs.  Wt Readings from Last 3 Encounters:  06/15/18 84.5 kg  01/16/18 82.8 kg   01/04/18 82.3 kg    I have personally reviewed following labs and imaging studies  Labs on Admission:  CBC: Recent Labs  Lab 06/14/18 1620 06/15/18 1359  WBC 7.9 8.0  NEUTROABS 4.1 3.7  HGB 11.3* 11.4*  HCT 32.6* 33.7*  MCV 93.7 95.2  PLT 241 161   Basic Metabolic Panel: Recent Labs  Lab 06/14/18 1620 06/15/18 1359 06/15/18 1403  NA 127* 128*  --   K 4.1 4.0  --   CL 95* 96*  --   CO2 23 20*  --   GLUCOSE 99 124*  --   BUN 17 14  --   CREATININE 1.26* 1.41* 1.40*  CALCIUM  9.3 9.5  --    Liver Function Tests: Recent Labs  Lab 06/15/18 1359  AST 26  ALT 18  ALKPHOS 54  BILITOT 0.9  PROT 6.7  ALBUMIN 4.0   No results for input(s): LIPASE, AMYLASE in the last 168 hours. No results for input(s): AMMONIA in the last 168 hours. Coagulation Profile: Recent Labs  Lab 06/15/18 1359  INR 1.0   Cardiac Enzymes: No results for input(s): CKTOTAL, CKMB, CKMBINDEX, TROPONINI in the last 168 hours. BNP (last 3 results) No results for input(s): PROBNP in the last 8760 hours. HbA1C: No results for input(s): HGBA1C in the last 72 hours. CBG: Recent Labs  Lab 06/14/18 1620 06/15/18 1356  GLUCAP 97 113*   Lipid Profile: No results for input(s): CHOL, HDL, LDLCALC, TRIG, CHOLHDL, LDLDIRECT in the last 72 hours. Thyroid Function Tests: Recent Labs    06/14/18 1610  TSH 3.899   Anemia Panel: No results for input(s): VITAMINB12, FOLATE, FERRITIN, TIBC, IRON, RETICCTPCT in the last 72 hours. Urine analysis:    Component Value Date/Time   COLORURINE YELLOW 06/15/2018 1513   APPEARANCEUR CLEAR 06/15/2018 1513   LABSPEC <1.005 (L) 06/15/2018 1513   PHURINE 7.0 06/15/2018 1513   GLUCOSEU NEGATIVE 06/15/2018 1513   HGBUR TRACE (A) 06/15/2018 1513   BILIRUBINUR NEGATIVE 06/15/2018 1513   KETONESUR NEGATIVE 06/15/2018 1513   PROTEINUR NEGATIVE 06/15/2018 1513   NITRITE NEGATIVE 06/15/2018 1513   LEUKOCYTESUR NEGATIVE 06/15/2018 1513   Sepsis Labs:  @LABRCNTIP (procalcitonin:4,lacticidven:4) )No results found for this or any previous visit (from the past 240 hour(s)).    Radiological Exams on Admission: Ct Head Wo Contrast  Result Date: 06/14/2018 CLINICAL DATA:  85 year old male with syncope EXAM: CT HEAD WITHOUT CONTRAST TECHNIQUE: Contiguous axial images were obtained from the base of the skull through the vertex without intravenous contrast. COMPARISON:  03/25/2018, 04/15/2017 FINDINGS: Brain: No acute intracranial hemorrhage. No midline shift or mass effect. Gray-white differentiation maintained. Senescent volume loss. Unremarkable appearance of the ventricular system. Patchy hypodensity in the periventricular white matter. Vascular: Mild intracranial atherosclerosis. Skull: No acute fracture.  No aggressive bone lesion identified. Sinuses/Orbits: Mucosal disease of the sphenoid sinus. Unremarkable orbits Other: None IMPRESSION: No acute intracranial abnormality. Evidence of chronic microvascular ischemic disease. Electronically Signed   By: Corrie Mckusick D.O.   On: 06/14/2018 17:23   Ct Head Code Stroke Wo Contrast  Result Date: 06/15/2018 CLINICAL DATA:  Code stroke. Left arm weakness and drift. Altered mental status. EXAM: CT HEAD WITHOUT CONTRAST TECHNIQUE: Contiguous axial images were obtained from the base of the skull through the vertex without intravenous contrast. COMPARISON:  06/14/2018 FINDINGS: Brain: There is no evidence of acute infarct, intracranial hemorrhage, mass, midline shift, or extra-axial fluid collection. Moderate cerebral atrophy is again noted. Patchy cerebral white matter hypodensities are unchanged and nonspecific but compatible with moderate chronic small vessel ischemic disease. Small chronic infarcts are again seen in the right thalamus and right cerebellum. Vascular: No hyperdense vessel. Skull: No fracture or focal osseous lesion. Sinuses/Orbits: Mild left sphenoid and left maxillary sinus mucosal thickening.  Clear mastoid air cells. Bilateral cataract extraction. Other: None. ASPECTS [facility] Stroke Program Early CT Score) - Ganglionic level infarction (caudate, lentiform nuclei, internal capsule, insula, M1-M3 cortex): 7 - Supraganglionic infarction (M4-M6 cortex): 3 Total score (0-10 with 10 being normal): 10 IMPRESSION: 1. No evidence of acute intracranial abnormality. 2. ASPECTS is 10. 3. Moderate chronic small vessel ischemic disease and cerebral atrophy. These results were communicated to Dr. Rory Percy  at 2:16 pm on 06/15/2018 by text page via the [facility] messaging system. Electronically Signed   By: Logan Bores M.D.   On: 06/15/2018 14:18    EKG: Independently reviewed.   Active Problems:   Hyponatremia   Assessment/Plan Hyponatremia: Admit patient for further work-up. Sodium today is 128. ER provider has administered fluid bolus. Check urine sodium, urine osmolality and serum osmolality. TSH was done yesterday (within normal range) Check cortisol. Cautious hydration Further management depend on hospital course.  Failure to thrive: Patient seems to be failing at home Patient has been falling. Consult physical and Occupational Therapy Consult case management Possible placement, short-term versus long-term.  Recurrent falls: See above (therapist to assess patient) Optimize patient's volume Work-up and manage hyponatremia (hyponatremia is associated with falls) Further management will depend on hospital course.  Weakness and fatigue: Manage expectantly Rehydrate patient Coreg and losartan. Possible subacute rehab  Dementia with worsening confusion: Continue to assess. No behavioral problems at this time.  Chronic kidney disease stage III, stable: Continue to monitor closely while on IV fluid  Hypertension: Continue to optimize.  DVT prophylaxis: Subacute Lovenox Code Status: Full code Family Communication:  Disposition Plan: Likely placement Consults called: None for  now Admission status: Inpatient  Time spent: 65 minutes  Dana Allan, MD  [facility] Pager #: [phone] 7PM-7AM contact night coverage as above  06/15/2018, 4:13 PM

## 2018-06-15 NOTE — TOC Initial Note (Signed)
Transition of Care [facility]) - Initial/Assessment Note    Patient Details  Name: Randy Sullivan MRN: [id number] Date of Birth: 1928-12-26  Transition of Care [facility] CM/SW Contact:    Wetzel Bjornstad, [place] Phone Number: 06/15/2018, 3:11 PM  Clinical Narrative:  CSW consulted as pt may be in need of SNF placement. CSW spoke with pt's wife [name] via phone. CSW was advised that pt is from home with wife. Wife reports that she is the one that has been caring for pt however it has become more challenging to do so as their aide stop coming due to the COVID-10 virus. Wife reported that she and pt were both set up with [facility] in t the past with [employer], however wife was not pleased with the services they received from the agency. CSW was advised that wife is wanting pt to return back home at the time of discharge if pt is able to walk with walker. If pt is unable to walk with walker at least, wife reports that pt will need SNF placement as she is unable to care for pt.   CSW spoke with MD and was advised that labs are still waiting to come back and once they have arrived back then a decision will be made to admit to the hospital or board in the ED for placement. CSW expressed to wife that if pt is needing SNF placement, then the possibility of choosing the first facility available is a must. Wife expressed that she wanted pt to go to [facility] however wife no longer wanting that since cases of COVID-19 are at this facility. Wife then expressed a desire for pt to go to [facility]. CSW advised wife that there is a buy in for this facility and if pt isn't already apart of their program then pt wouldn't be eligible for placement at this facility. Wife expressed understanding of this. CSW did receive verbal permission to seek other facilities for pt if needed.                  Expected Discharge Plan: Home/Self Care Barriers to Discharge: Continued Medical Work up   Patient Goals and CMS Choice Patient states  their goals for this hospitalization and ongoing recovery are:: pt's wife epressed the desire to get pt back home.  CMS Medicare.gov Compare Post Acute Care list provided to:: Other (Comment Required)(no one at this time. ) Choice offered to / list presented to : NA  Expected Discharge Plan and Services Expected Discharge Plan: Home/Self Care   Discharge Planning Services: CM Consult Post Acute Care Choice: NA Living arrangements for the past 2 months: Single Family Home(with wife. )                 DME Arranged: N/A DME Agency: NA       HH Arranged: NA HH Agency: NA        Prior Living Arrangements/Services Living arrangements for the past 2 months: Single Family Home(with wife. ) Lives with:: Spouse Patient language and need for interpreter reviewed:: Yes Do you feel safe going back to the place where you live?: No(per wife if pt can walk she is willing to take pt back home if not then wife expresseed that pt will need SNF. )      Need for Family Participation in Patient Care: Yes (Comment) Care giver support system in place?: No (comment)(not at this time due to COVID-19) Current home services: Other (comment)(per pt's wife, they  were getting services through a company however it stopped due to COVID-19) Criminal Activity/Legal Involvement Pertinent to Current Situation/Hospitalization: No - Comment as needed  Activities of Daily Living      Permission Sought/Granted Permission sought to share information with : Family Supports Permission granted to share information with : Yes, Verbal Permission Granted  Share Information with NAME: Azavier Creson   Permission granted to share info w AGENCY: family  Permission granted to share info w Relationship: spouse  Permission granted to share info w Contact Information: Glynis Smiles  Emotional Assessment Appearance:: Appears stated age Attitude/Demeanor/Rapport: Unable to Assess Affect (typically observed): Unable to  Assess Orientation: : (spoke with wife via phone. ) Alcohol / Substance Use: Not Applicable Psych Involvement: No (comment)  Admission diagnosis:  stroke Patient Active Problem List   Diagnosis Date Noted  . Thyroid disease   . Sleep apnea   . Shortness of breath   . Prostatism   . Osteoarthritis   . Mood disorder ([place])   . Hypothyroidism   . Hypertension   . Hyperlipidemia   . Heart murmur   . GERD (gastroesophageal reflux disease)   . Familial tremor   . DJD (degenerative joint disease)   . Cancer ([place])   . Seizure ([place]) 01/21/2017  . Transient speech disturbance   . CKD (chronic kidney disease), stage III ([place]) 01/13/2017  . Syncope 01/13/2017  . OA (osteoarthritis) of knee 11/08/2016  . Essential hypertension 02/05/2016  . Thoracic aortic atherosclerosis ([place]) 02/05/2016  . Benign familial tremor 02/05/2016  . COPD (chronic obstructive pulmonary disease) ([place]) 01/21/2016  . OSA (obstructive sleep apnea) 10/23/2015  . Upper airway cough syndrome 10/23/2015  . Exertional dyspnea 10/23/2015  . RLS (restless legs syndrome) 10/23/2015  . Osteoarthritis of knee 06/04/2010  . Dizziness 06/04/2010  . Benign hypertensive heart disease without heart failure 06/04/2010  . Hypercholesterolemia 06/04/2010  . BPH (benign prostatic hyperplasia) 06/04/2010   PCP:  Prince Solian, MD Pharmacy:   [facility] [place] [address] Phone: [phone] Fax: [phone]     Social Determinants of Health (SDOH) Interventions    Readmission Risk Interventions No flowsheet data found.

## 2018-06-15 NOTE — Code Documentation (Signed)
Patient arrives from home via EMS per report he was initially unresponsive then more responsive, possible arm drift. History of dementia and "syncopal" event yesterday. Upon arrival patient is stiff, difficult to move all extremities.  Stat labs and head ct done.  Dr Rory Percy at bedside to assess patient.  NIHSS 10 drift in all extremities,unable to answer questions or follow commands.  Fluctuating exam with patient's alertness/attention.  When fully alert NIHSS 3, unable to answer questions and some dysarthria.  Code Stroke canceled per Dr Rory Percy at [phone].

## 2018-06-15 NOTE — ED Notes (Signed)
Code stroke canceled per Dr. Rory Percy

## 2018-06-15 NOTE — ED Triage Notes (Signed)
[name] sitting with him at the table and he suddenly became unconscious at 1240. EMS arrived, pt more arousalable to painful stimuli/ increasingly more alert per EMS through duration of their care. Pt had left arm/left leg weakness. EMS called code stroke. Pt more altered than baseline. BP 154/100, HR 70. 99% room air.

## 2018-06-15 NOTE — NC FL2 (Signed)
[place] LEVEL OF CARE SCREENING TOOL     IDENTIFICATION  Patient Name: Randy Sullivan Birthdate: 11-Jun-1928 Sex: male Admission Date (Current Location): 06/15/2018  [facility] and [place] Number:  [employer] and Address:  [facility] [place], [place] [address] [place],  [address]      Provider Number: [id number]  Attending Physician Name and Address:  Quintella Reichert, MD  Relative Name and Phone Number:       Current Level of Care: Hospital Recommended Level of Care: [place] Prior Approval Number:    Date Approved/Denied:   PASRR Number:   [id number]   Discharge Plan: Home    Current Diagnoses: Patient Active Problem List   Diagnosis Date Noted  . Thyroid disease   . Sleep apnea   . Shortness of breath   . Prostatism   . Osteoarthritis   . Mood disorder ([place])   . Hypothyroidism   . Hypertension   . Hyperlipidemia   . Heart murmur   . GERD (gastroesophageal reflux disease)   . Familial tremor   . DJD (degenerative joint disease)   . Cancer ([place])   . Seizure ([place]) 01/21/2017  . Transient speech disturbance   . CKD (chronic kidney disease), stage III ([place]) 01/13/2017  . Syncope 01/13/2017  . OA (osteoarthritis) of knee 11/08/2016  . Essential hypertension 02/05/2016  . Thoracic aortic atherosclerosis ([place]) 02/05/2016  . Benign familial tremor 02/05/2016  . COPD (chronic obstructive pulmonary disease) ([place]) 01/21/2016  . OSA (obstructive sleep apnea) 10/23/2015  . Upper airway cough syndrome 10/23/2015  . Exertional dyspnea 10/23/2015  . RLS (restless legs syndrome) 10/23/2015  . Osteoarthritis of knee 06/04/2010  . Dizziness 06/04/2010  . Benign hypertensive heart disease without heart failure 06/04/2010  . Hypercholesterolemia 06/04/2010  . BPH (benign prostatic hyperplasia) 06/04/2010    Orientation RESPIRATION BLADDER Height & Weight     (unknown at this time. )  Normal Incontinent  Weight: 186 lb 4.6 oz (84.5 kg) Height:     BEHAVIORAL SYMPTOMS/MOOD NEUROLOGICAL BOWEL NUTRITION STATUS      Incontinent Diet(please see discharge summary)  AMBULATORY STATUS COMMUNICATION OF NEEDS Skin   Extensive Assist Verbally(per wife usally speaks) Normal                       Personal Care Assistance Level of Assistance  Bathing, Feeding, Dressing Bathing Assistance: Maximum assistance Feeding assistance: Limited assistance Dressing Assistance: Maximum assistance     Functional Limitations Info  Sight, Hearing, Speech Sight Info: Impaired(wears classes) Hearing Info: Adequate Speech Info: Adequate    SPECIAL CARE FACTORS FREQUENCY  PT (By licensed PT), OT (By licensed OT)     PT Frequency:  5 times a week  OT Frequency: 5 times week             Contractures Contractures Info: Not present    Additional Factors Info  Code Status, Allergies Code Status Info: Full Code Allergies Info: Tizanidine           Current Medications (06/15/2018):  This is the current hospital active medication list No current facility-administered medications for this encounter.    Current Outpatient Medications  Medication Sig Dispense Refill  . acetaminophen (TYLENOL) 500 MG tablet Take 1,000 mg by mouth 2 (two) times daily with a meal.     . aspirin 81 MG tablet Take 81 mg by mouth daily.    . Cholecalciferol (VITAMIN D-3) 25 MCG (  1000 UT) CAPS Take 1,000 Units by mouth daily.    Marland Kitchen gabapentin (NEURONTIN) 100 MG capsule Take 100 mg by mouth at bedtime.    . iron polysaccharides (NIFEREX) 150 MG capsule Take 150 mg by mouth every Tuesday.     . levothyroxine (SYNTHROID, LEVOTHROID) 112 MCG tablet Take 112 mcg by mouth daily before breakfast.     . loratadine (CLARITIN) 10 MG tablet Take 10 mg by mouth at bedtime.     . mirabegron ER (MYRBETRIQ) 50 MG TB24 tablet Take 50 mg by mouth every evening.     . montelukast (SINGULAIR) 10 MG tablet Take 1 tablet (10 mg total) by mouth  at bedtime. 30 tablet 5  . omeprazole (PRILOSEC) 20 MG capsule Take 20 mg by mouth daily.    Vladimir Faster Glycol-Propyl Glycol (SYSTANE OP) Place 1-2 drops into both eyes as needed (for dry eyes).     . propranolol (INDERAL) 20 MG tablet Take 20 mg by mouth 2 (two) times daily.     Marland Kitchen saccharomyces boulardii (FLORASTOR) 250 MG capsule Take 250 mg by mouth at bedtime.     . triamcinolone cream (KENALOG) 0.1 % Apply 1 application topically daily as needed for itching.    . vitamin B-12 (CYANOCOBALAMIN) 1000 MCG tablet Take 1,000 mcg by mouth daily.       Discharge Medications: Please see discharge summary for a list of discharge medications.  Relevant Imaging Results:  Relevant Lab Results:   Additional Information [phone]. Still awaiting PT to assess for SNF needs.   Wetzel Bjornstad, LCSWA

## 2018-06-15 NOTE — ED Notes (Signed)
[facility] called to cancel code stroke

## 2018-06-15 NOTE — Consult Note (Addendum)
Neurology Consultation  Reason for Consult: AMS/ Code Stroke Referring Physician: Ayesha Rumpf  CC: AMS  History is obtained from: EMS  HPI: Randy Sullivan is a 85 y.o. male with history of multiple syncopal events including yesterday, sleep apnea, hypothyroidism, hypertension, hyperlipidemia, familial tremor sleep disorder-periodic limb movement disorder and nonmelanoma skin cancer which has been removed.   Patient was here yesterday secondary to wife finding him on the ground which looks like syncopal episode per wife.  She mentioned the patient was sitting on her chest slumped over, when she went to call for 911.  She states that he has had 6 different episodes in the past with no definitive diagnosis.  He had been seen by inpatient neurology and started on Keppra for stereotypic episodes concerning for seizures although imaging and EEGs have been unremarkable. Outpatient neurology has seen him in the past at that time we did not think that it was seizure disorder.   Yesterday he did have a CT of the head without any acute intracranial abnormality.   They do note that he does have a loop recorder and has not shown any concerning arrhythmias documented.  Patient was sent home and to be rechecked by PCP.  Today patient returns to the hospital secondary to patient noting to have slumped over to the right however wife says that this lasted longer than usual.  When further investigating his previous event she states that he usually will be sitting down, slumped over to the right, however after he slumps over he will come back to baseline without any difficulty.   She also admits that he has been having worsening memory over the past couple years, shuffling steps, walks with a walker, has difficulty getting up out of a chair without pushing or having help.  Increased stiffness throughout.  Tendency to fall backwards.  And his memory has gotten worse to the point where if they go shopping he forgets where they  had just gone.  She does all of the bills and helps him with his ADLs.  Upon arrival to the hospital patient was alert, however had difficulty following commands and was noted to have paratonia throughout.  He was also noted to have a pill-rolling tremor in his right greater than left hand.  But no localizing or lateralizing abnormalities.  His level of consciousness gradually improved from the time EMS had initially seen him when he was very somnolent and barely open eyes to nearly participating completely in the exam.  ED course: CT head/labs  LKW: 12:45 tpa given?: no, symptoms not consistent with stroke Premorbid modified Rankin scale (mRS): 3 NIH stroke scale of 8  LKG:MWNUUV to obtain due to altered mental status.   Past Medical History:  Diagnosis Date  . BPH (benign prostatic hyperplasia)   . Cancer ([place])    non melanoma skin cancers removed  . DJD (degenerative joint disease)   . Familial tremor   . GERD (gastroesophageal reflux disease)   . Heart murmur    hx of  . Hyperlipidemia   . Hypertension   . Hypothyroidism   . Mood disorder ([place])   . Osteoarthritis   . Prostatism   . Shortness of breath    mild with excertion  . Sleep apnea    cpap  . Syncope 01/13/2017  . Thyroid disease     Family History  Problem Relation Age of Onset  . Heart attack Father   . Colon cancer Mother   . Colon cancer Sister  Social History:   reports that he quit smoking about 48 years ago. His smoking use included cigarettes. He has a 36.00 pack-year smoking history. He has never used smokeless tobacco. He reports current alcohol use. He reports that he does not use drugs.  Medications  Current Facility-Administered Medications:  .  sodium chloride 0.9 % bolus 500 mL, 500 mL, Intravenous, Once, Quintella Reichert, MD  Current Outpatient Medications:  .  acetaminophen (TYLENOL) 500 MG tablet, Take 1,000 mg by mouth 2 (two) times daily with a meal. , Disp: , Rfl:  .  aspirin 81 MG  tablet, Take 81 mg by mouth daily., Disp: , Rfl:  .  Cholecalciferol (VITAMIN D-3) 25 MCG (1000 UT) CAPS, Take 1,000 Units by mouth daily., Disp: , Rfl:  .  gabapentin (NEURONTIN) 100 MG capsule, Take 100 mg by mouth at bedtime., Disp: , Rfl:  .  iron polysaccharides (NIFEREX) 150 MG capsule, Take 150 mg by mouth every Tuesday. , Disp: , Rfl:  .  levothyroxine (SYNTHROID, LEVOTHROID) 112 MCG tablet, Take 112 mcg by mouth daily before breakfast. , Disp: , Rfl:  .  loratadine (CLARITIN) 10 MG tablet, Take 10 mg by mouth at bedtime. , Disp: , Rfl:  .  mirabegron ER (MYRBETRIQ) 50 MG TB24 tablet, Take 50 mg by mouth every evening. , Disp: , Rfl:  .  montelukast (SINGULAIR) 10 MG tablet, Take 1 tablet (10 mg total) by mouth at bedtime., Disp: 30 tablet, Rfl: 5 .  omeprazole (PRILOSEC) 20 MG capsule, Take 20 mg by mouth daily., Disp: , Rfl:  .  Polyethyl Glycol-Propyl Glycol (SYSTANE OP), Place 1-2 drops into both eyes as needed (for dry eyes). , Disp: , Rfl:  .  propranolol (INDERAL) 20 MG tablet, Take 20 mg by mouth 2 (two) times daily. , Disp: , Rfl:  .  saccharomyces boulardii (FLORASTOR) 250 MG capsule, Take 250 mg by mouth at bedtime. , Disp: , Rfl:  .  triamcinolone cream (KENALOG) 0.1 %, Apply 1 application topically daily as needed for itching., Disp: , Rfl:  .  vitamin B-12 (CYANOCOBALAMIN) 1000 MCG tablet, Take 1,000 mcg by mouth daily., Disp: , Rfl:    Exam: Current vital signs: BP 134/76 (BP Location: Left Arm)   Pulse 73   Temp 98.1 F (36.7 C) (Oral)   Resp 19   Wt 84.5 kg   SpO2 99%   BMI 25.27 kg/m  Vital signs in last 24 hours: Temp:  [98.1 F (36.7 C)] 98.1 F (36.7 C) (04/23 1430) Pulse Rate:  [68-92] 73 (04/23 1430) Resp:  [17-19] 19 (04/23 1430) BP: (134-178)/(76-92) 134/76 (04/23 1430) SpO2:  [98 %-99 %] 99 % (04/23 1430) Weight:  [84.5 kg] 84.5 kg (04/23 1415)  Physical Exam  Constitutional: Appears well-developed and well-nourished.  Psych: Confused Eyes: No  scleral injection HENT: No OP obstrucion Head: Normocephalic.  Cardiovascular: Normal rate and regular rhythm.  Respiratory: Effort normal, non-labored breathing GI: Soft.  No distension. There is no tenderness.  Skin: WDI  Neuro: Mental Status: Patient is alert, not oriented, has difficulty following commands, hard of hearing, able to follow simple commands inconsistently and sometimes mimic but unable to follow two-step commands. Cranial Nerves: II: Visual Fields are full.  III,IV, VI: EOMI without ptosis or diploplia. Pupils equal, round and reactive to light V: Facial sensation is symmetric to temperature VII: Facial movement is symmetric.  VIII: hearing decreased X: Palat elevates symmetrically XI: Shoulder shrug is symmetric. XII: tongue is midline without  atrophy or fasciculations.  Motor: Paratonia throughout, able lift all extremities antigravity Sensory: Sensation is symmetric to light touch and temperature in the arms and legs. Deep Tendon Reflexes: Due to patient having increased tone/paratonia lower extremities had no deep tendon reflexes in the upper extremities had 1+ Plantars: Toes are downgoing bilaterally.  Cerebellar exam was difficult to obtain.  Labs I have reviewed labs in epic and the results pertinent to this consultation are: CBC    Component Value Date/Time   WBC 8.0 06/15/2018 1359   RBC 3.54 (L) 06/15/2018 1359   HGB 11.4 (L) 06/15/2018 1359   HCT 33.7 (L) 06/15/2018 1359   PLT 228 06/15/2018 1359   MCV 95.2 06/15/2018 1359   MCH 32.2 06/15/2018 1359   MCHC 33.8 06/15/2018 1359   RDW 12.6 06/15/2018 1359   LYMPHSABS 2.2 06/15/2018 1359   MONOABS 1.0 06/15/2018 1359   EOSABS 0.8 (H) 06/15/2018 1359   BASOSABS 0.2 (H) 06/15/2018 1359    CMP     Component Value Date/Time   NA 128 (L) 06/15/2018 1359   K 4.0 06/15/2018 1359   CL 96 (L) 06/15/2018 1359   CO2 20 (L) 06/15/2018 1359   GLUCOSE 124 (H) 06/15/2018 1359   BUN 14 06/15/2018  1359   CREATININE 1.40 (H) 06/15/2018 1403   CALCIUM 9.5 06/15/2018 1359   PROT 6.7 06/15/2018 1359   ALBUMIN 4.0 06/15/2018 1359   AST 26 06/15/2018 1359   ALT 18 06/15/2018 1359   ALKPHOS 54 06/15/2018 1359   BILITOT 0.9 06/15/2018 1359   GFRNONAA 44 (L) 06/15/2018 1359   GFRAA 52 (L) 06/15/2018 1359    Imaging I have reviewed the images obtained: CT-scan of the brain-no evidence of acute intracranial abnormality   Etta Quill PA-C [facility] [phone]  M-F  (9:00 am- 5:00 PM)  06/15/2018, 2:36 PM    Attending addendum Patient seen and examined. We obtain history from patient's wife over the phone. He has been seen in the past by inpatient neurology for behavioral arrest and possibly stereotypic episodes of altered mental status and started on Keppra which was discontinued by outpatient neurology with low suspicion for seizure. He has a history of sleep apnea, periodic limb movement disorder, hypertension hyperlipidemia and hypothyroidism and has been continued to have these levels of altered consciousness and episodes of what the wife describes syncope. The wife reports that he has been getting more and more stiff and unable to walk by himself ever since he has been restricted indoors due to the ongoing movement restrictions from the [place] pandemic. The wife is unable to take care of him at home. Patient is unable to provide any reliable history or review of systems. The wife does not report any preceding illnesses sicknesses fevers chills shortness of breath cough nausea vomiting.  Assessment:  85 year old male who has presented multiple times to the emergency department for evaluation of what has been deemed as syncopal episodes.   Per history he has had a prolonged but slow decline in cognition, increased tone, difficulty with gait and multiple syncopal-like episodes or passing out episodes upon standing up concerning for orthostatic hypotension.. During my  last encounter, with him in 2018, because of the stereotypic of his episodes, I had started him on Keppra to treat any underlying electrographic abnormality but Keppra was discontinued by outpatient neurology upon his regular follow-ups in conjunction with his family's request. Again at this time I do not believe given the history and exam represents stroke  or seizure.  This likely represents a neurocognitive decline with progressive rigidity.  Patient was recommended to go to a neurocognitive specialist in the past however has not as of yet.  Impression: -Dementia with behavioral disturbance -Increased rigidity and gait instability -Metabolic abnormalities such as sodium being 128/bumped creatinine from 1.26-1.4  Recommendations: - Correct patient's metabolic abnormalities -PT/OT - Will need placement as wife cannot take care of him at home secondary to his gait instability and worsening function.  This has been discussed with the wife and she is in full agreement. -Hydrate and keep hydrated when out of the hospital -No acute stroke intervention is indicated at this time.  I would not recommend any further imaging also. -Outpatient follow-up with neurology, but I suspect his most pressing need would be correction of the toxic metabolic derangements, hydration status and placement.  I have left a message for the outpatient neurologist to discuss this case further.  I am awaiting a call back and will update recommendations based on my discussion with the outpatient neurologist.  I have also relayed my plan to the ED provider, Dr. Ralene Bathe in person.  "Code stroke" can be canceled.  -- Amie Portland, MD [facility] Pager: [phone] If 7pm to 7am, please call on call as listed on AMION.

## 2018-06-15 NOTE — Progress Notes (Signed)
Patient admitted to room 3w14 from ED due to syncope episode. Patient alert/oriented to self and generalized weakness with bed movement. Right eye appears squinted/asymettrical. Skin assessment completed, falls precautions in place and oriented to room.

## 2018-06-16 DIAGNOSIS — F03918 Unspecified dementia, unspecified severity, with other behavioral disturbance: Secondary | ICD-10-CM

## 2018-06-16 DIAGNOSIS — N183 Chronic kidney disease, stage 3 unspecified: Secondary | ICD-10-CM

## 2018-06-16 DIAGNOSIS — G9341 Metabolic encephalopathy: Secondary | ICD-10-CM

## 2018-06-16 DIAGNOSIS — F0391 Unspecified dementia with behavioral disturbance: Secondary | ICD-10-CM

## 2018-06-16 DIAGNOSIS — R627 Adult failure to thrive: Secondary | ICD-10-CM

## 2018-06-16 LAB — BASIC METABOLIC PANEL
Anion gap: 14 (ref 5–15)
BUN: 16 mg/dL (ref 8–23)
CO2: 19 mmol/L — ABNORMAL LOW (ref 22–32)
Calcium: 9.2 mg/dL (ref 8.9–10.3)
Chloride: 97 mmol/L — ABNORMAL LOW (ref 98–111)
Creatinine, Ser: 1.16 mg/dL (ref 0.61–1.24)
GFR calc Af Amer: >60 mL/min (ref >60)
GFR calc non Af Amer: 56 mL/min — ABNORMAL LOW (ref >60)
Glucose, Bld: 96 mg/dL (ref 70–99)
Potassium: 3.8 mmol/L (ref 3.5–5.1)
Sodium: 130 mmol/L — ABNORMAL LOW (ref 135–145)

## 2018-06-16 LAB — VITAMIN B12: Vitamin B-12: 986 pg/mL — ABNORMAL HIGH (ref 180–914)

## 2018-06-16 LAB — SODIUM, URINE, RANDOM: Sodium, Ur: 61 mmol/L

## 2018-06-16 LAB — CBC
HCT: 30.5 % — ABNORMAL LOW (ref 39.0–52.0)
Hemoglobin: 10.9 g/dL — ABNORMAL LOW (ref 13.0–17.0)
MCH: 32.5 pg (ref 26.0–34.0)
MCHC: 35.7 g/dL (ref 30.0–36.0)
MCV: 91 fL (ref 80.0–100.0)
Platelets: 232 10*3/uL (ref 150–400)
RBC: 3.35 MIL/uL — ABNORMAL LOW (ref 4.22–5.81)
RDW: 12.5 % (ref 11.5–15.5)
WBC: 7.9 10*3/uL (ref 4.0–10.5)
nRBC: 0 % (ref 0.0–0.2)

## 2018-06-16 LAB — OSMOLALITY, URINE: Osmolality, Ur: 256 mOsm/kg — ABNORMAL LOW (ref 300–900)

## 2018-06-16 LAB — URINE CULTURE: Culture: NO GROWTH

## 2018-06-16 LAB — TSH: TSH: 1.614 u[IU]/mL (ref 0.350–4.500)

## 2018-06-16 LAB — FOLATE: Folate: 14.8 ng/mL (ref >5.9)

## 2018-06-16 LAB — T4, FREE: Free T4: 1.55 ng/dL (ref 0.82–1.77)

## 2018-06-16 LAB — AMMONIA: Ammonia: 27 umol/L (ref 9–35)

## 2018-06-16 MED ORDER — HYDRALAZINE HCL 20 MG/ML IJ SOLN
5.0000 mg | Freq: Three times a day (TID) | INTRAMUSCULAR | Status: DC | PRN
  Administered 2018-06-16 – 2018-06-18 (×2): 5 mg via INTRAVENOUS
  Filled 2018-06-16 (×2): qty 1

## 2018-06-16 NOTE — Progress Notes (Signed)
PROGRESS NOTE  Randy Sullivan UEA:[id number] DOB: 1928/11/27 DOA: 06/15/2018 PCP: Prince Solian, MD  Brief History:  85 year old male with a history of dementia, recurrent syncope, hypertension, hyperlipidemia, hypothyroidism, OSA, CKD stage III presenting with confusion and transient alteration of the level of consciousness.  The patient has had an extensive work-up for recurrent syncope in the past including an implanted loop recorder which has not shown any dysrhythmia.  The patient is also had multiple EEGs in the past.  He has been following up with an outpatient neurologist, Dr. Lajoyce Lauber, whom does not feel that the patient's episodes are consistent with seizure.  Because of the patient's dementia, he is unable to provide any significant history.  According to the patient's wife, the patient has had a gradual progressive functional and cognitive decline over the past year.  He has required increasing assistance with his ADLs.  The patient visited the emergency department on 06/14/2018 with another "syncopal" episode.  It was felt to be vasovagal in etiology, and the patient was sent home in stable condition from the emergency department.  He presented again on 06/15/2018 again with a transient alteration of his level of consciousness.  According to the patient's wife, the patient was sitting at the dinner table eating breakfast.  She noticed that the patient was getting "more sleepy and lethargic".  Subsequently, the patient's wife went over to history and eased him to the right side where he subsequently slumped over.  There was no tonic-clonic activity.  There was no bowel or bladder incontinence.  She stated he was still breathing spontaneously.  EMS was activated.  Upon arrival, the patient was arousable but lethargic and confused.  As result, the patient was brought to emergency department for further evaluation.  In the ED, the patient was afebrile hemodynamically stable saturating 97%  on room air.  06/14/2018 CT of the brain was negative for any acute findings.  Neurology was consulted to assist with management.  Assessment/Plan: Acute metabolic encephalopathy -In part due to hyponatremia -Urinalysis negative for pyuria -YNW--2.956 -Serum O13 -Folic acid -Ammonia -Urinalysis negative for pyuria -UDS neg -obtain COVID-19 test in preparation for SNF placement  Hyponatremia -Secondary to volume depletion and poor solute intake -Wife relates that the patient was previously on Megace, and his oral intake has decreased since discontinuation 1 month prior to admission -Continue normal saline--> sodium gradually improving  Transient alteration of level of consciousness -Patient has had extensive work-up in the past for "syncope" including loop recorder, EEG, and echocardiogram all of which have been essentially unremarkable -Certainly the patient may have some type of sleep attack or parasomnia but suspicion is low -Appreciate neurology consultation--> did not recommend any further imaging or work-up at this time -Given the patient's recurrent work-up and constellation of symptoms, neurology felt this is likely a progression of the patient's neurocognitive decline with recent episode attributable to toxic metabolic derangements  Failure to thrive -PT evaluation -Work-up as above  CKD stage III -Baseline creatinine 1.1-1.4  Dementia with behavioral disturbance -Patient is at risk for hospital delirium  Hypothyroidism -Continue Synthroid  COPD -Patient has seen pulmonary in the past -Stable on room air  Essential hypertension -Not on any agents outpatient -Monitor clinically -Blood pressure acceptable presently  Total time spent 35 minutes.  Greater than 50% spent face to face counseling and coordinating care.    Disposition Plan:   SNF 1-2 days Family Communication:   Spouse updated 4/24  Consultants:  neuro  Code Status:  FULL /   DVT Prophylaxis:   [place] Lovenox   Procedures: As Listed in Progress Note Above  Antibiotics: None       Subjective: Patient denies fevers, chills, headache, chest pain, dyspnea, nausea, vomiting, diarrhea, abdominal pain. Remainder of ROS unobtainable due to encephalopathy   Objective: Vitals:   06/15/18 2335 06/16/18 0153 06/16/18 0438 06/16/18 0749  BP: (!) 152/78 (!) 155/90 (!) 168/84 (!) 164/77  Pulse: 86 80 83 88  Resp: 16 18 18 16   Temp: 98.4 F (36.9 C) 97.7 F (36.5 C) 97.9 F (36.6 C) 97.9 F (36.6 C)  TempSrc: Oral Oral Oral Oral  SpO2: 94% 97% 96% 96%  Weight:        Intake/Output Summary (Last 24 hours) at 06/16/2018 9233 Last data filed at 06/15/2018 2359 Gross per 24 hour  Intake 920 ml  Output 200 ml  Net 720 ml   Weight change:  Exam:   General:  Pt is alert, follows commands appropriately, not in acute distress  HEENT: No icterus, No thrush, No neck mass, [place]/AT  Cardiovascular: RRR, S1/S2, no rubs, no gallops  Respiratory: CTA bilaterally, no wheezing, no crackles, no rhonchi  Abdomen: Soft/+BS, non tender, non distended, no guarding  Extremities: No edema, No lymphangitis, No petechiae, No rashes, no synovitis   Data Reviewed: I have personally reviewed following labs and imaging studies Basic Metabolic Panel: Recent Labs  Lab 06/14/18 1620 06/15/18 1359 06/15/18 1403 06/15/18 1823 06/16/18 0333  NA 127* 128*  --   --  130*  K 4.1 4.0  --   --  3.8  CL 95* 96*  --   --  97*  CO2 23 20*  --   --  19*  GLUCOSE 99 124*  --   --  96  BUN 17 14  --   --  16  CREATININE 1.26* 1.41* 1.40* 1.25* 1.16  CALCIUM 9.3 9.5  --   --  9.2  MG  --   --   --  2.0  --   PHOS  --   --   --  3.6  --    Liver Function Tests: Recent Labs  Lab 06/15/18 1359  AST 26  ALT 18  ALKPHOS 54  BILITOT 0.9  PROT 6.7  ALBUMIN 4.0   No results for input(s): LIPASE, AMYLASE in the last 168 hours. No results for input(s): AMMONIA in the last 168 hours. Coagulation  Profile: Recent Labs  Lab 06/15/18 1359  INR 1.0   CBC: Recent Labs  Lab 06/14/18 1620 06/15/18 1359 06/15/18 1823 06/16/18 0333  WBC 7.9 8.0 6.9 7.9  NEUTROABS 4.1 3.7  --   --   HGB 11.3* 11.4* 11.0* 10.9*  HCT 32.6* 33.7* 30.7* 30.5*  MCV 93.7 95.2 91.1 91.0  PLT 241 228 230 232   Cardiac Enzymes: No results for input(s): CKTOTAL, CKMB, CKMBINDEX, TROPONINI in the last 168 hours. BNP: Invalid input(s): POCBNP CBG: Recent Labs  Lab 06/14/18 1620 06/15/18 1356  GLUCAP 97 113*   HbA1C: No results for input(s): HGBA1C in the last 72 hours. Urine analysis:    Component Value Date/Time   COLORURINE YELLOW 06/15/2018 1513   APPEARANCEUR CLEAR 06/15/2018 1513   LABSPEC <1.005 (L) 06/15/2018 1513   PHURINE 7.0 06/15/2018 1513   GLUCOSEU NEGATIVE 06/15/2018 1513   HGBUR TRACE (A) 06/15/2018 1513   BILIRUBINUR NEGATIVE 06/15/2018 1513   KETONESUR NEGATIVE 06/15/2018 1513  PROTEINUR NEGATIVE 06/15/2018 1513   NITRITE NEGATIVE 06/15/2018 1513   LEUKOCYTESUR NEGATIVE 06/15/2018 1513   Sepsis Labs: @LABRCNTIP (procalcitonin:4,lacticidven:4) )No results found for this or any previous visit (from the past 240 hour(s)).   Scheduled Meds:  aspirin EC  81 mg Oral Daily   cholecalciferol  1,000 Units Oral Daily   enoxaparin (LOVENOX) injection  40 mg Subcutaneous Q24H   gabapentin  100 mg Oral QHS   [START ON 06/20/2018] iron polysaccharides  150 mg Oral Q Tue   levothyroxine  112 mcg Oral QAC breakfast   loratadine  10 mg Oral QHS   mirabegron ER  50 mg Oral QPM   montelukast  10 mg Oral QHS   pantoprazole  40 mg Oral Daily   propranolol  20 mg Oral BID   vitamin B-12  1,000 mcg Oral Daily   Continuous Infusions:  sodium chloride 50 mL/hr at 06/15/18 1900    Procedures/Studies: Ct Head Wo Contrast  Result Date: 06/14/2018 CLINICAL DATA:  85 year old male with syncope EXAM: CT HEAD WITHOUT CONTRAST TECHNIQUE: Contiguous axial images were obtained  from the base of the skull through the vertex without intravenous contrast. COMPARISON:  03/25/2018, 04/15/2017 FINDINGS: Brain: No acute intracranial hemorrhage. No midline shift or mass effect. Gray-white differentiation maintained. Senescent volume loss. Unremarkable appearance of the ventricular system. Patchy hypodensity in the periventricular white matter. Vascular: Mild intracranial atherosclerosis. Skull: No acute fracture.  No aggressive bone lesion identified. Sinuses/Orbits: Mucosal disease of the sphenoid sinus. Unremarkable orbits Other: None IMPRESSION: No acute intracranial abnormality. Evidence of chronic microvascular ischemic disease. Electronically Signed   By: Corrie Mckusick D.O.   On: 06/14/2018 17:23   Ct Head Code Stroke Wo Contrast  Result Date: 06/15/2018 CLINICAL DATA:  Code stroke. Left arm weakness and drift. Altered mental status. EXAM: CT HEAD WITHOUT CONTRAST TECHNIQUE: Contiguous axial images were obtained from the base of the skull through the vertex without intravenous contrast. COMPARISON:  06/14/2018 FINDINGS: Brain: There is no evidence of acute infarct, intracranial hemorrhage, mass, midline shift, or extra-axial fluid collection. Moderate cerebral atrophy is again noted. Patchy cerebral white matter hypodensities are unchanged and nonspecific but compatible with moderate chronic small vessel ischemic disease. Small chronic infarcts are again seen in the right thalamus and right cerebellum. Vascular: No hyperdense vessel. Skull: No fracture or focal osseous lesion. Sinuses/Orbits: Mild left sphenoid and left maxillary sinus mucosal thickening. Clear mastoid air cells. Bilateral cataract extraction. Other: None. ASPECTS [facility] Stroke Program Early CT Score) - Ganglionic level infarction (caudate, lentiform nuclei, internal capsule, insula, M1-M3 cortex): 7 - Supraganglionic infarction (M4-M6 cortex): 3 Total score (0-10 with 10 being normal): 10 IMPRESSION: 1. No evidence of  acute intracranial abnormality. 2. ASPECTS is 10. 3. Moderate chronic small vessel ischemic disease and cerebral atrophy. These results were communicated to Dr. Rory Percy at 2:16 pm on 06/15/2018 by text page via the [facility] messaging system. Electronically Signed   By: Logan Bores M.D.   On: 06/15/2018 14:18    Orson Eva, DO  [facility] Pager [phone]  If 7PM-7AM, please contact night-coverage [email or web address] Password [email or web address] 06/16/2018, 8:07 AM   LOS: 1 day

## 2018-06-16 NOTE — Evaluation (Signed)
Occupational Therapy Evaluation Patient Details Name: Randy Sullivan MRN: [id number] DOB: Mar 23, 1928 Today's Date: 06/16/2018    History of Present Illness Randy Sullivan is a 85 y.o. male with history of multiple syncopal events, sleep apnea, hypothyroidism, hypertension, hyperlipidemia, familial tremor sleep disorder-periodic limb movement disorder and nonmelanoma skin cancer which has been removed.  He has a syncopal episode on 4/22 and CT of the head without any acute intracranial abnormality.   Clinical Impression   PTA Pt was at home with wife, used RW for mobility/transfers did most of his ADL by himself but wife would assist PRN with LB ADL. Today Pt was max +2 for sit<>stand with RW, and ranged from min to total A for ADL. Pt with cognitive, activity tolerance, balance deficits impacting independence. Pt will require skilled OT in the acute setting as well as afterwards at the SNF level to maximize safety and independence in ADL and functional transfers.     Follow Up Recommendations  SNF;Supervision/Assistance - 24 hour    Equipment Recommendations  Other (comment)(defer to next venue of care)    Recommendations for Other Services Other (comment)(Palliative - or Code Status conversation)     Precautions / Restrictions Precautions Precautions: Fall Restrictions Weight Bearing Restrictions: No      Mobility Bed Mobility Overal bed mobility: Needs Assistance Bed Mobility: Supine to Sit     Supine to sit: Mod assist;+2 for physical assistance;+2 for safety/equipment;HOB elevated     General bed mobility comments: Pt able to initiate legs off the bed, but ended up needed assist to get his legs over the edge. Support for trunk elevation and use of bed pad to bring hips EOB  Transfers Overall transfer level: Needs assistance Equipment used: 2 person hand held assist Transfers: Sit to/from [facility] Sit to Stand: Max assist;+2 physical assistance;+2  safety/equipment;From elevated surface Stand pivot transfers: Mod assist;+2 physical assistance;+2 safety/equipment       General transfer comment: vc for sequencing, assist for boost, initially posterior lean which improved with time and cues    Balance Overall balance assessment: Needs assistance Sitting-balance support: Bilateral upper extremity supported;Feet supported Sitting balance-[name]: Fair   Postural control: Posterior lean Standing balance support: Bilateral upper extremity supported Standing balance-[name]: Poor Standing balance comment: strong posterior lean initially, which improved with cues in standing but continued to require RW and external assist                           ADL either performed or assessed with clinical judgement   ADL Overall ADL's : Needs assistance/impaired Eating/Feeding: Minimal assistance;Sitting Eating/Feeding Details (indicate cue type and reason): assist to open containers, he struggled to initiate eating, but was able to  Grooming: Minimal assistance;Sitting Grooming Details (indicate cue type and reason): assist initiating grooming tasks Upper Body Bathing: Moderate assistance;Sitting   Lower Body Bathing: Maximal assistance   Upper Body Dressing : Moderate assistance   Lower Body Dressing: Maximal assistance;+2 for physical assistance;+2 for safety/equipment;Sit to/from stand   Toilet Transfer: Maximal assistance;+2 for physical assistance;+2 for safety/equipment;Stand-pivot(face to face) Toilet Transfer Details (indicate cue type and reason): cues for taking pivotal steps Toileting- Clothing Manipulation and Hygiene: Total assistance       Functional mobility during ADLs: Maximal assistance;+2 for physical assistance;+2 for safety/equipment;Cueing for sequencing;Cueing for safety General ADL Comments: cognition, balance, awareness, all impacting independence in ADL     Vision Baseline Vision/History:  Wears glasses  Perception     Praxis      Pertinent Vitals/Pain Pain Assessment: No/denies pain     Hand Dominance Right   Extremity/Trunk Assessment Upper Extremity Assessment Upper Extremity Assessment: Generalized weakness   Lower Extremity Assessment Lower Extremity Assessment: Defer to PT evaluation       Communication Communication Communication: HOH   Cognition Arousal/Alertness: Awake/alert Behavior During Therapy: WFL for tasks assessed/performed Overall Cognitive Status: Impaired/Different from baseline Area of Impairment: Orientation;Attention;Memory;Following commands;Safety/judgement;Awareness;Problem solving                 Orientation Level: Disoriented to;Place;Person;Situation;Time(cannot recall name, doesn't knoe where he is) Current Attention Level: Sustained Memory: Decreased short-term memory;Decreased recall of precautions Following Commands: Follows one step commands with increased time;Follows one step commands inconsistently Safety/Judgement: Decreased awareness of safety;Decreased awareness of deficits Awareness: Intellectual Problem Solving: Slow processing;Decreased initiation;Difficulty sequencing;Requires verbal cues;Requires tactile cues General Comments: Can confirm his name is Randy Sullivan, but unable to state his full name or where he is. Could not tell us who he lives with (when told he lives with his wife he said "Oh yeah" extra time for processessing and multimodal cues throughout session   General Comments       Exercises     Shoulder Instructions      Home Living Family/patient expects to be discharged to:: Private residence Living Arrangements: Spouse/significant other Available Help at Discharge: Family;Available 24 hours/day Type of Home: House Home Access: Stairs to enter [employer] of Steps: 3 Entrance Stairs-Rails: Right;Left Home Layout: Multi-level;Laundry or work area in basement;Able to live on main  level with bedroom/bathroom     Bathroom Shower/Tub: Teacher, early years/pre: Standard Bathroom Accessibility: Yes How Accessible: Accessible via walker Home Equipment: Gilford Rile - 2 wheels;Shower seat;Grab bars - tub/shower   Additional Comments: Pt confused and home set up taken from previous admission in 2018      Prior Functioning/Environment Level of Independence: Needs assistance  Gait / Transfers Assistance Needed: uses RW ADL's / Homemaking Assistance Needed: wife assists with ADL   Comments: gleaned from chart review        OT Problem List: Decreased activity tolerance;Impaired balance (sitting and/or standing);Decreased cognition;Decreased safety awareness;Decreased knowledge of use of DME or AE      OT Treatment/Interventions: Self-care/ADL training;Therapeutic exercise;Energy conservation;DME and/or AE instruction;Therapeutic activities;Cognitive remediation/compensation;Patient/family education;Balance training    OT Goals(Current goals can be found in the care plan section) Acute Rehab OT Goals Patient Stated Goal: none stated ADL Goals Pt Will Perform Grooming: with set-up;sitting Pt Will Perform Upper Body Dressing: with set-up;sitting Pt Will Perform Lower Body Dressing: with mod assist;sit to/from stand Pt Will Transfer to Toilet: with mod assist;ambulating Pt Will Perform Toileting - Clothing Manipulation and hygiene: with min assist;sitting/lateral leans  OT Frequency: Min 2X/week   Barriers to D/C:    [name] has been caring for him at home, recent cancer survivor       Co-evaluation PT/OT/SLP Co-Evaluation/Treatment: Yes Reason for Co-Treatment: Necessary to address cognition/behavior during functional activity;For patient/therapist safety;To address functional/ADL transfers PT goals addressed during session: Mobility/safety with mobility;Balance OT goals addressed during session: ADL's and self-care      AM-PAC OT "6 Clicks" Daily  Activity     Outcome Measure Help from another person eating meals?: A Little Help from another person taking care of personal grooming?: A Little Help from another person toileting, which includes using toliet, bedpan, or urinal?: A Lot Help from another person bathing (including washing, rinsing,  drying)?: A Lot Help from another person to put on and taking off regular upper body clothing?: A Lot Help from another person to put on and taking off regular lower body clothing?: A Lot 6 Click Score: 14   End of Session Equipment Utilized During Treatment: Gait belt;Rolling walker Nurse Communication: Mobility status(use Stedy for transfer back to bed)  Activity Tolerance: Patient tolerated treatment well Patient left: in chair;with call bell/phone within reach;with chair alarm set;with nursing/sitter in room  OT Visit Diagnosis: Unsteadiness on feet (R26.81);Other abnormalities of gait and mobility (R26.89);Repeated falls (R29.6);History of falling (Z91.81);Muscle weakness (generalized) (M62.81);Other symptoms and signs involving cognitive function                Time: 1735-6701 OT Time Calculation (min): 23 min Charges:  OT General Charges $OT Visit: 1 Visit OT Evaluation $OT Eval Moderate Complexity: [place] OTR/L Acute Rehabilitation Services Pager: [phone] Office: [place] 06/16/2018, 11:55 AM

## 2018-06-16 NOTE — Evaluation (Signed)
Physical Therapy Evaluation Patient Details Name: Randy Sullivan MRN: [id number] DOB: 01/31/1929 Today's Date: 06/16/2018   History of Present Illness  Randy Sullivan is a 85 y.o. male with history of multiple syncopal events, sleep apnea, hypothyroidism, hypertension, hyperlipidemia, familial tremor sleep disorder-periodic limb movement disorder and nonmelanoma skin cancer which has been removed.  He has a syncopal episode on 4/22 and CT of the head without any acute intracranial abnormality.  Clinical Impression  Pt admitted secondary to problem above with deficits below. Pt with cognitive deficits and required increased time to perform mobility tasks. Required mod-max A +2 to perform transfer to chair. Feel pt at increased risk for falls and will require increased assist at d/c. Will continue to follow acutely to maximize functional mobility independence and safety.     Follow Up Recommendations SNF;Supervision/Assistance - 24 hour    Equipment Recommendations  None recommended by PT    Recommendations for Other Services       Precautions / Restrictions Precautions Precautions: Fall Restrictions Weight Bearing Restrictions: No      Mobility  Bed Mobility Overal bed mobility: Needs Assistance Bed Mobility: Supine to Sit     Supine to sit: Mod assist;+2 for physical assistance;+2 for safety/equipment;HOB elevated     General bed mobility comments: Pt able to initiate legs off the bed, but ended up needed assist to get his legs over the edge. Support for trunk elevation and use of bed pad to bring hips EOB  Transfers Overall transfer level: Needs assistance Equipment used: 2 person hand held assist Transfers: Sit to/from [facility] Sit to Stand: Max assist;+2 physical assistance;+2 safety/equipment;From elevated surface Stand pivot transfers: Mod assist;+2 physical assistance;+2 safety/equipment       General transfer comment: vc for sequencing, assist for boost,  initially posterior lean which improved with time and cues  Ambulation/Gait                Stairs            Wheelchair Mobility    Modified Rankin (Stroke Patients Only)       Balance Overall balance assessment: Needs assistance Sitting-balance support: Bilateral upper extremity supported;Feet supported Sitting balance-[name]: Fair   Postural control: Posterior lean Standing balance support: Bilateral upper extremity supported Standing balance-[name]: Poor Standing balance comment: strong posterior lean initially, which improved with cues in standing but continued to require RW and external assist                             Pertinent Vitals/Pain Pain Assessment: No/denies pain    Home Living Family/patient expects to be discharged to:: Private residence Living Arrangements: Spouse/significant other Available Help at Discharge: Family;Available 24 hours/day Type of Home: House Home Access: Stairs to enter Entrance Stairs-Rails: Psychiatric nurse of Steps: 3 Home Layout: Multi-level;Laundry or work area in basement;Able to live on main level with bedroom/bathroom Home Equipment: Environmental consultant - 2 wheels;Shower seat;Grab bars - tub/shower Additional Comments: Pt confused and home set up taken from previous admission in 2018    Prior Function Level of Independence: Needs assistance   Gait / Transfers Assistance Needed: uses RW  ADL's / Homemaking Assistance Needed: wife assists with ADL  Comments: gleaned from chart review     Hand Dominance   Dominant Hand: Right    Extremity/Trunk Assessment   Upper Extremity Assessment Upper Extremity Assessment: Defer to OT evaluation    Lower Extremity Assessment Lower  Extremity Assessment: Generalized weakness    Cervical / Trunk Assessment Cervical / Trunk Assessment: Kyphotic  Communication   Communication: HOH  Cognition Arousal/Alertness: Awake/alert Behavior During  Therapy: WFL for tasks assessed/performed Overall Cognitive Status: Impaired/Different from baseline Area of Impairment: Orientation;Attention;Memory;Following commands;Safety/judgement;Awareness;Problem solving                 Orientation Level: Disoriented to;Place;Person;Situation;Time(cannot recall name, doesn't knoe where he is) Current Attention Level: Sustained Memory: Decreased short-term memory;Decreased recall of precautions Following Commands: Follows one step commands with increased time;Follows one step commands inconsistently Safety/Judgement: Decreased awareness of safety;Decreased awareness of deficits Awareness: Intellectual Problem Solving: Slow processing;Decreased initiation;Difficulty sequencing;Requires verbal cues;Requires tactile cues General Comments: Can confirm his name is Randy Sullivan, but unable to state his full name or where he is. Could not tell us who he lives with (when told he lives with his wife he said "Oh yeah" extra time for processessing and multimodal cues throughout session      General Comments      Exercises     Assessment/Plan    PT Assessment Patient needs continued PT services  PT Problem List Decreased strength;Decreased balance;Decreased mobility;Decreased coordination;Decreased knowledge of use of DME;Decreased cognition;Decreased safety awareness;Decreased knowledge of precautions       PT Treatment Interventions DME instruction;Gait training;Functional mobility training;Therapeutic activities;Therapeutic exercise;Balance training;Patient/family education;Cognitive remediation    PT Goals (Current goals can be found in the Care Plan section)  Acute Rehab PT Goals Patient Stated Goal: none stated PT Goal Formulation: Patient unable to participate in goal setting Time For Goal Achievement: 06/30/18 Potential to Achieve Goals: Fair    Frequency Min 2X/week   Barriers to discharge        Co-evaluation PT/OT/SLP  Co-Evaluation/Treatment: Yes Reason for Co-Treatment: Necessary to address cognition/behavior during functional activity;For patient/therapist safety;To address functional/ADL transfers PT goals addressed during session: Mobility/safety with mobility;Balance;Proper use of DME         AM-PAC PT "6 Clicks" Mobility  Outcome Measure Help needed turning from your back to your side while in a flat bed without using bedrails?: A Lot Help needed moving from lying on your back to sitting on the side of a flat bed without using bedrails?: A Lot Help needed moving to and from a bed to a chair (including a wheelchair)?: A Lot Help needed standing up from a chair using your arms (e.g., wheelchair or bedside chair)?: Total Help needed to walk in hospital room?: Total Help needed climbing 3-5 steps with a railing? : Total 6 Click Score: 9    End of Session Equipment Utilized During Treatment: Gait belt Activity Tolerance: Patient tolerated treatment well Patient left: in chair;with call bell/phone within reach;with chair alarm set;with nursing/sitter in room Nurse Communication: Mobility status PT Visit Diagnosis: Unsteadiness on feet (R26.81);Muscle weakness (generalized) (M62.81);Difficulty in walking, not elsewhere classified (R26.2)    Time: 4193-7902 PT Time Calculation (min) (ACUTE ONLY): 23 min   Charges:   PT Evaluation $PT Eval Moderate Complexity: [place], PT, DPT  Acute Rehabilitation Services  Pager: [phone] Office: [phone]   Rudean Hitt 06/16/2018, 5:00 PM

## 2018-06-17 DIAGNOSIS — G9341 Metabolic encephalopathy: Secondary | ICD-10-CM

## 2018-06-17 LAB — NOVEL CORONAVIRUS, NAA (HOSP ORDER, SEND-OUT TO REF LAB; TAT 18-24 HRS): SARS-CoV-2, NAA: NOT DETECTED

## 2018-06-17 LAB — BASIC METABOLIC PANEL
Anion gap: 15 (ref 5–15)
BUN: 16 mg/dL (ref 8–23)
CO2: 18 mmol/L — ABNORMAL LOW (ref 22–32)
Calcium: 9.2 mg/dL (ref 8.9–10.3)
Chloride: 94 mmol/L — ABNORMAL LOW (ref 98–111)
Creatinine, Ser: 0.96 mg/dL (ref 0.61–1.24)
GFR calc Af Amer: >60 mL/min (ref >60)
GFR calc non Af Amer: >60 mL/min (ref >60)
Glucose, Bld: 102 mg/dL — ABNORMAL HIGH (ref 70–99)
Potassium: 4 mmol/L (ref 3.5–5.1)
Sodium: 127 mmol/L — ABNORMAL LOW (ref 135–145)

## 2018-06-17 LAB — MAGNESIUM: Magnesium: 1.8 mg/dL (ref 1.7–2.4)

## 2018-06-17 MED ORDER — MAGNESIUM SULFATE 4 GM/100ML IV SOLN
4.0000 g | Freq: Once | INTRAVENOUS | Status: AC
  Administered 2018-06-17: 09:00:00 4 g via INTRAVENOUS
  Filled 2018-06-17: qty 100

## 2018-06-17 MED ORDER — MEGESTROL ACETATE 400 MG/10ML PO SUSP
200.0000 mg | Freq: Every day | ORAL | Status: DC
  Administered 2018-06-17 – 2018-06-19 (×3): 200 mg via ORAL
  Filled 2018-06-17: qty 10
  Filled 2018-06-17: qty 5
  Filled 2018-06-17 (×2): qty 10

## 2018-06-17 NOTE — Progress Notes (Signed)
PROGRESS NOTE    Randy Sullivan  TGY:[id number] DOB: Jan 18, 1929 DOA: 06/15/2018 PCP: Prince Solian, MD   Brief Narrative:85 year old male with a history of dementia, recurrent syncope, hypertension, hyperlipidemia, hypothyroidism, OSA, CKD stage III presenting with confusion and transient alteration of the level of consciousness.  The patient has had an extensive work-up for recurrent syncope in the past including an implanted loop recorder which has not shown any dysrhythmia.  The patient is also had multiple EEGs in the past.  He has been following up with an outpatient neurologist, Dr. Lajoyce Lauber, whom does not feel that the patient's episodes are consistent with seizure.  Because of the patient's dementia, he is unable to provide any significant history.  According to the patient's wife, the patient has had a gradual progressive functional and cognitive decline over the past year.  He has required increasing assistance with his ADLs.  The patient visited the emergency department on 06/14/2018 with another "syncopal" episode.  It was felt to be vasovagal in etiology, and the patient was sent home in stable condition from the emergency department.  He presented again on 06/15/2018 again with a transient alteration of his level of consciousness.  According to the patient's wife, the patient was sitting at the dinner table eating breakfast.  She noticed that the patient was getting "more sleepy and lethargic".  Subsequently, the patient's wife went over to history and eased him to the right side where he subsequently slumped over.  There was no tonic-clonic activity.  There was no bowel or bladder incontinence.  She stated he was still breathing spontaneously.  EMS was activated.  Upon arrival, the patient was arousable but lethargic and confused.  As result, the patient was brought to emergency department for further evaluation.  In the ED, the patient was afebrile hemodynamically stable saturating 97% on  room air.  06/14/2018 CT of the brain was negative for any acute findings.  Neurology was consulted to assist with management. Pertinent labs: Chemistry reveals sodium of 120, potassium of 4, chloride 96, CO2 20, BUN of 14, creatinine of 1.4 with blood sugar of 124.  C reveals WBC of 8, hemoglobin of 11.4, hematocrit of 33.7, MCV of 95.2 platelet count of 228.  Assessment & Plan:   Active Problems:   Hyponatremia   Acute metabolic encephalopathy   CKD (chronic kidney disease) stage 3, GFR 30-59 ml/min (HCC)   Dementia with behavioral disturbance (HCC)   Failure to thrive in adult  Hyponatremia:most likely nutritional ... Sodium today is 127 down from 130 TSH normal.  cortisol 9.2 Cautious hydration Recheck labs tomorrow  Failure to thrive: Patient seems to be failing at home Patient has been falling. Consult physical and Occupational Therapy Consult case management Possible placement, short-term versus long-term. CONSULT PALLIATIVE CARE DW WIFE  Restarted Megace for appetite  Recurrent falls: Physical therapy recommend SNF  Dementia with worsening confusion: Continue to assess. No behavioral problems at this time.  Chronic kidney disease stage III, stable: Continue to monitor closely while on IV fluid  Hypertension: Continue to optimize.  DVT prophylaxis: Subacute Lovenox Code Status: DO NOT RESUSCITATE Family Communication:  Discussed with his wife Disposition Plan: SNF once sodium improves  consults called: None for now   Estimated body mass index is 25.27 kg/m as calculated from the following:   Height as of 01/16/18: 6' (1.829 m).   Weight as of this encounter: 84.5 kg.    Subjective: Patient resting in bed pleasantly confused smiling not sure where  he is very decreased p.o. intake  Objective: Vitals:   06/16/18 2000 06/16/18 2306 06/17/18 0344 06/17/18 0741  BP: (!) 183/97 (!) 182/98 (!) 160/86 (!) 138/96  Pulse:  86 82 87  Resp:  16 16 16     Temp:  98.3 F (36.8 C) 97.9 F (36.6 C) 98.3 F (36.8 C)  TempSrc:  Oral Axillary Axillary  SpO2:  92% 97% 95%  Weight:        Intake/Output Summary (Last 24 hours) at 06/17/2018 1053 Last data filed at 06/17/2018 0840 Gross per 24 hour  Intake 1849.69 ml  Output --  Net 1849.69 ml   Filed Weights   06/15/18 1300 06/15/18 1415  Weight: 84.5 kg 84.5 kg    Examination:  General exam: Appears calm and comfortable  Respiratory system: Clear to auscultation. Respiratory effort normal. Cardiovascular system: S1 & S2 heard, RRR. No JVD, murmurs, rubs, gallops or clicks. No pedal edema. Gastrointestinal system: Abdomen is nondistended, soft and nontender. No organomegaly or masses felt. Normal bowel sounds heard. Central nervous system: Pleasantly confused moves all extremities Extremities: Symmetric 5 x 5 power. Skin: No rashes, lesions or ulcers    Data Reviewed: I have personally reviewed following labs and imaging studies  CBC: Recent Labs  Lab 06/14/18 1620 06/15/18 1359 06/15/18 1823 06/16/18 0333  WBC 7.9 8.0 6.9 7.9  NEUTROABS 4.1 3.7  --   --   HGB 11.3* 11.4* 11.0* 10.9*  HCT 32.6* 33.7* 30.7* 30.5*  MCV 93.7 95.2 91.1 91.0  PLT 241 228 230 778   Basic Metabolic Panel: Recent Labs  Lab 06/14/18 1620 06/15/18 1359 06/15/18 1403 06/15/18 1823 06/16/18 0333 06/17/18 0422  NA 127* 128*  --   --  130* 127*  K 4.1 4.0  --   --  3.8 4.0  CL 95* 96*  --   --  97* 94*  CO2 23 20*  --   --  19* 18*  GLUCOSE 99 124*  --   --  96 102*  BUN 17 14  --   --  16 16  CREATININE 1.26* 1.41* 1.40* 1.25* 1.16 0.96  CALCIUM 9.3 9.5  --   --  9.2 9.2  MG  --   --   --  2.0  --  1.8  PHOS  --   --   --  3.6  --   --    GFR: Estimated Creatinine Clearance: 59.5 mL/min (by C-G formula based on SCr of 0.96 mg/dL). Liver Function Tests: Recent Labs  Lab 06/15/18 1359  AST 26  ALT 18  ALKPHOS 54  BILITOT 0.9  PROT 6.7  ALBUMIN 4.0   No results for input(s):  LIPASE, AMYLASE in the last 168 hours. Recent Labs  Lab 06/16/18 0856  AMMONIA 27   Coagulation Profile: Recent Labs  Lab 06/15/18 1359  INR 1.0   Cardiac Enzymes: No results for input(s): CKTOTAL, CKMB, CKMBINDEX, TROPONINI in the last 168 hours. BNP (last 3 results) No results for input(s): PROBNP in the last 8760 hours. HbA1C: No results for input(s): HGBA1C in the last 72 hours. CBG: Recent Labs  Lab 06/14/18 1620 06/15/18 1356  GLUCAP 97 113*   Lipid Profile: No results for input(s): CHOL, HDL, LDLCALC, TRIG, CHOLHDL, LDLDIRECT in the last 72 hours. Thyroid Function Tests: Recent Labs    06/16/18 0856  TSH 1.614  FREET4 1.55   Anemia Panel: Recent Labs    06/16/18 0856  VITAMINB12 986*  FOLATE 14.8  Sepsis Labs: No results for input(s): PROCALCITON, LATICACIDVEN in the last 168 hours.  Recent Results (from the past 240 hour(s))  Urine culture     Status: None   Collection Time: 06/15/18  3:13 PM  Result Value Ref Range Status   Specimen Description URINE, CATHETERIZED  Final   Special Requests NONE  Final   Culture   Final    NO GROWTH Performed at [facility], [address], [place], [place] [address]    Report Status 06/16/2018 FINAL  Final  Novel Coronavirus, NAA (hospital order; send-out to ref lab)     Status: None   Collection Time: 06/16/18  9:09 AM  Result Value Ref [place] Status   SARS-CoV-2, NAA NOT DETECTED NOT DETECTED Final    Comment: (NOTE) This test was developed and its performance characteristics determined by [employer]. This test has not been FDA cleared or approved. This test has been authorized by FDA under an Emergency Use Authorization (EUA). This test has been validated in accordance with the FDA's Guidance Document (Policy for [place] in Laboratories Certified to Perform High Complexity Testing under CLIA prior to Emergency Use Authorization for Coronavirus [id number] during the [facility] Emergency) issued on February 29th, 2020. FDA independent review of this validation is pending. This test is only authorized for the duration of time the declaration that circumstances exist justifying the authorization of the emergency use of in vitro diagnostic tests for detection of SARS-CoV- 2 virus and/or diagnosis of COVID-19 infection under section 564(b)(1) of the Act, 21 U.S.C. 939QZE-0(P)(2), unless the authorization is terminated or revoked sooner. Performed At: [facility] [address] [place], [place] [id number] Rush Farmer MD JF:[id number]    [place]  Final    Comment: Performed at [facility], [place] [address], [place], [place] [address]         Radiology Studies: Ct Head Code Stroke Wo Contrast  Result Date: 06/15/2018 CLINICAL DATA:  Code stroke. Left arm weakness and drift. Altered mental status. EXAM: CT HEAD WITHOUT CONTRAST TECHNIQUE: Contiguous axial images were obtained from the base of the skull through the vertex without intravenous contrast. COMPARISON:  06/14/2018 FINDINGS: Brain: There is no evidence of acute infarct, intracranial hemorrhage, mass, midline shift, or extra-axial fluid collection. Moderate cerebral atrophy is again noted. Patchy cerebral white matter hypodensities are unchanged and nonspecific but compatible with moderate chronic small vessel ischemic disease. Small chronic infarcts are again seen in the right thalamus and right cerebellum. Vascular: No hyperdense vessel. Skull: No fracture or focal osseous lesion. Sinuses/Orbits: Mild left sphenoid and left maxillary sinus mucosal thickening. Clear mastoid air cells. Bilateral cataract extraction. Other: None. ASPECTS [facility] Stroke Program Early CT Score) - Ganglionic level infarction (caudate, lentiform nuclei, internal capsule, insula, M1-M3 cortex): 7 - Supraganglionic infarction (M4-M6 cortex): 3 Total score (0-10 with 10 being normal): 10  IMPRESSION: 1. No evidence of acute intracranial abnormality. 2. ASPECTS is 10. 3. Moderate chronic small vessel ischemic disease and cerebral atrophy. These results were communicated to Dr. Rory Percy at 2:16 pm on 06/15/2018 by text page via the [facility] messaging system. Electronically Signed   By: Logan Bores M.D.   On: 06/15/2018 14:18        Scheduled Meds:  aspirin EC  81 mg Oral Daily   cholecalciferol  1,000 Units Oral Daily   enoxaparin (LOVENOX) injection  40 mg Subcutaneous Q24H   gabapentin  100 mg Oral QHS   [START  ON 06/20/2018] iron polysaccharides  150 mg Oral Q Tue   levothyroxine  112 mcg Oral QAC breakfast   loratadine  10 mg Oral QHS   megestrol  200 mg Oral Daily   mirabegron ER  50 mg Oral QPM   montelukast  10 mg Oral QHS   pantoprazole  40 mg Oral Daily   propranolol  20 mg Oral BID   vitamin B-12  1,000 mcg Oral Daily   Continuous Infusions:  sodium chloride 50 mL/hr at 06/17/18 0603     LOS: 2 days     Georgette Shell, MD [facility]  If 7PM-7AM, please contact night-coverage [email or web address] Password [facility] 06/17/2018, 10:53 AM

## 2018-06-17 NOTE — Progress Notes (Addendum)
UPDATE:  Patient does not qualify for home first but Georgina Snell with [facility] can assist with setting up home health services with private pay nurse aides.   Domenic Schwab, MSW, LCSW-A Clinical Social Worker [facility] [employer]    CSW called and spoke with the patients wife. She is adamant about bringing him home but is receptive to him going to SNF if he needs it.   CSW called Georgina Snell with [facility] to see if the patient would qualify for Home First. CSW is awaiting a phone call back.   Patient's wife preferences are [place] and [place] for SNF. CSW will continue to assist with disposition.   Domenic Schwab, MSW, [place]

## 2018-06-18 DIAGNOSIS — R1310 Dysphagia, unspecified: Secondary | ICD-10-CM

## 2018-06-18 DIAGNOSIS — Z515 Encounter for palliative care: Secondary | ICD-10-CM

## 2018-06-18 DIAGNOSIS — F0151 Vascular dementia with behavioral disturbance: Secondary | ICD-10-CM

## 2018-06-18 DIAGNOSIS — Z7189 Other specified counseling: Secondary | ICD-10-CM

## 2018-06-18 LAB — BASIC METABOLIC PANEL
Anion gap: 11 (ref 5–15)
BUN: 18 mg/dL (ref 8–23)
CO2: 20 mmol/L — ABNORMAL LOW (ref 22–32)
Calcium: 8.6 mg/dL — ABNORMAL LOW (ref 8.9–10.3)
Chloride: 97 mmol/L — ABNORMAL LOW (ref 98–111)
Creatinine, Ser: 1.04 mg/dL (ref 0.61–1.24)
GFR calc Af Amer: >60 mL/min (ref >60)
GFR calc non Af Amer: >60 mL/min (ref >60)
Glucose, Bld: 97 mg/dL (ref 70–99)
Potassium: 3.7 mmol/L (ref 3.5–5.1)
Sodium: 128 mmol/L — ABNORMAL LOW (ref 135–145)

## 2018-06-18 NOTE — Consult Note (Signed)
Consultation Note Date: 06/18/2018   Patient Name: Randy Sullivan  DOB: Jul 11, 1928  MRN: [id number]  Age / Sex: 85 y.o., male  PCP: Prince Solian, MD Referring Physician: Georgette Shell, MD  Reason for Consultation: Establishing goals of care and Psychosocial/spiritual support  HPI/Patient Profile: 85 y.o. male  with past medical history of dementia, recurrent syncopal episodes, hypertension, hyperlipidemia, hypothyroidism, degenerative disc disease, BPH, obstructive sleep apnea, chronic kidney disease stage III admitted on 06/15/2018 with worsening confusion, hyponatremia.  Patient was seen in the emergency room on 06/14/2018 for syncopal episode as well as low sodium.  He was sent home, subsequently brought back with worsening confusion and weakness.  Sodium upon admission was 120.  Consult ordered for goals of care in the setting of dementia.   Clinical Assessment and Goals of Care: Patient seen, chart reviewed.  Family meeting held with patient's wife, via phone, secondary to COVID-19 visitor restrictions.  Introduced palliative medicine services as an additional resource and source of support with the goal to improve quality of life for both patient and family.  Confirmed that patient is a DNR DNI.  We did begin discussions regarding artificial feeding.  Patient spouse states that they had not discussed this particular aspect of goals of care but she "does not think that he would benefit from this".  I supported that decision that feeding tubes have not been proven per studies to prolong life much less quality of life in dementia patients.  She does share with me that he has had some choking episodes at home.  Patient has a very interesting history.  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  His  wife states that 2 people have approached him about writing his memoirs.  She has seen a decline in his functional ability since March. At baseline he is only eating bites and sips He has begun to think that people are in the home, increasing weakness, inability to walk but just a few steps now as well as ongoing syncopal episodes.  .  Wife reports that they have seen multiple providers about this.  He has had 7 or 8 total she shares with me.  He has been worked up for seizure disorder was and that was found to be negative.  He had a loop recorder placed questionable December 2019 which thus far has not revealed any data  Patient is unable to speak for himself secondary to dementia.  He is married, no children.  His spouse, Giovany Cosby at [phone]    SUMMARY OF RECOMMENDATIONS   DNR DNI Will order SLP consultation for diet modification Plan is to go home with home health and private caregivers versus SNF. At this point, I do not feel that patient meets hospice criteria for in-home care Patient, spouse, would benefit from further goals of care discussion and monitoring of symptoms, hospice eligibility from community-based palliative care services.  Those can be accessed through either [facility] at [phone] or [facility] palliative medicine division at [phone].  Would recommend MOST form be prepared once meeting can be held with patient's spouse Code Status/Advance Care Planning:  DNR    Symptom Management:   Dysphasia: We will order SLP evaluation  Palliative Prophylaxis:   Aspiration, Bowel Regimen, Delirium Protocol, Eye Care, Frequent Pain Assessment, Oral Care and Turn Reposition  Additional Recommendations (Limitations, Scope, Preferences):  Avoid Hospitalization, Minimize Medications, No Artificial Feeding, No Chemotherapy, No Hemodialysis, No Surgical Procedures and No Tracheostomy  Psycho-social/Spiritual:   Desire for  further Chaplaincy support:no  Additional Recommendations: Referral to Community Resources   Prognosis:   Unable to determine  Discharge Planning: Home with Home Health      Primary Diagnoses: Present on Admission: . Hyponatremia   I have reviewed the medical record, interviewed the patient and family, and examined the patient. The following aspects are pertinent.  Past Medical History:  Diagnosis Date  . BPH (benign prostatic hyperplasia)   . Cancer ([place])    non melanoma skin cancers removed  . DJD (degenerative joint disease)   . Familial tremor   . GERD (gastroesophageal reflux disease)   . Heart murmur    hx of  . Hyperlipidemia   . Hypertension   . Hypothyroidism   . Mood disorder ([place])   . Osteoarthritis   . Prostatism   . Shortness of breath    mild with excertion  . Sleep apnea    cpap  . Syncope 01/13/2017  . Thyroid disease    Social History   Socioeconomic History  . Marital status: Married    Spouse name: Not on file  . Number of children: Not on file  . Years of education: Not on file  . Highest education level: Not on file  Occupational History  . Occupation: retired  Scientific laboratory technician  . Financial resource strain: Not on file  . Food insecurity:    Worry: Not on file    Inability: Not on file  . Transportation needs:    Medical: Not on file    Non-medical: Not on file  Tobacco Use  . Smoking status: Former Smoker    Packs/day: 2.00    Years: 18.00    Pack years: 36.00    Types: Cigarettes    Last attempt to quit: 06/04/1970    Years since quitting: 48.0  . Smokeless tobacco: Never Used  Substance and Sexual Activity  . Alcohol use: Yes    Alcohol/week: 0.0 standard drinks    Comment: special occasions  . Drug use: No  . Sexual activity: Not Currently  Lifestyle  . Physical activity:    Days per week: Not on file    Minutes per session: Not on file  . Stress: Not on file  Relationships  . Social connections:    Talks on phone:  Not on file    Gets together: Not on file    Attends religious service: Not on file    Active member of club or organization: Not on file    Attends meetings of clubs or organizations: Not on file    Relationship status: Not on file  Other Topics Concern  . Not on file  Social History Narrative   Rare caffeine use    Family History  Problem Relation Age of Onset  . Heart attack Father   . Colon cancer Mother   . Colon cancer Sister  Scheduled Meds: . aspirin EC  81 mg Oral Daily  . cholecalciferol  1,000 Units Oral Daily  . enoxaparin (LOVENOX) injection  40 mg Subcutaneous Q24H  . gabapentin  100 mg Oral QHS  . [START ON 06/20/2018] iron polysaccharides  150 mg Oral Q Tue  . levothyroxine  112 mcg Oral QAC breakfast  . loratadine  10 mg Oral QHS  . megestrol  200 mg Oral Daily  . mirabegron ER  50 mg Oral QPM  . montelukast  10 mg Oral QHS  . pantoprazole  40 mg Oral Daily  . propranolol  20 mg Oral BID  . vitamin B-12  1,000 mcg Oral Daily   Continuous Infusions: . sodium chloride 50 mL/hr at 06/18/18 0153   PRN Meds:.hydrALAZINE Medications Prior to Admission:  Prior to Admission medications   Medication Sig Start Date End Date Taking? Authorizing Provider  acetaminophen (TYLENOL) 500 MG tablet Take 1,000 mg by mouth 2 (two) times daily with a meal.     [provider]  aspirin 81 MG tablet Take 81 mg by mouth daily.    [provider]  Cholecalciferol (VITAMIN D-3) 25 MCG (1000 UT) CAPS Take 1,000 Units by mouth daily.    [provider]  gabapentin (NEURONTIN) 100 MG capsule Take 100 mg by mouth at bedtime. 10/28/16   [provider]  iron polysaccharides (NIFEREX) 150 MG capsule Take 150 mg by mouth every Tuesday.     [provider]  levothyroxine (SYNTHROID, LEVOTHROID) 112 MCG tablet Take 112 mcg by mouth daily before breakfast.  10/14/14   [provider]  loratadine (CLARITIN) 10 MG tablet Take 10 mg by mouth  at bedtime.     [provider]  mirabegron ER (MYRBETRIQ) 50 MG TB24 tablet Take 50 mg by mouth every evening.     [provider]  montelukast (SINGULAIR) 10 MG tablet Take 1 tablet (10 mg total) by mouth at bedtime. 11/07/17   Rigoberto Noel, MD  omeprazole (PRILOSEC) 20 MG capsule Take 20 mg by mouth daily.    [provider]  Polyethyl Glycol-Propyl Glycol (SYSTANE OP) Place 1-2 drops into both eyes as needed (for dry eyes).     [provider]  propranolol (INDERAL) 20 MG tablet Take 20 mg by mouth 2 (two) times daily.  06/18/16   [provider]  saccharomyces boulardii (FLORASTOR) 250 MG capsule Take 250 mg by mouth at bedtime.     [provider]  triamcinolone cream (KENALOG) 0.1 % Apply 1 application topically daily as needed for itching.    [provider]  vitamin B-12 (CYANOCOBALAMIN) 1000 MCG tablet Take 1,000 mcg by mouth daily.    [provider]   Allergies  Allergen Reactions  . Tizanidine Other (See Comments)    Hypotension    Review of Systems  Unable to perform ROS: Dementia    Physical Exam Vitals signs and nursing note reviewed.  Constitutional:      Appearance: He is ill-appearing.  HENT:     Head: Normocephalic and atraumatic.  Cardiovascular:     Rate and Rhythm: Normal rate.  Pulmonary:     Effort: Pulmonary effort is normal.  Musculoskeletal: Normal range of motion.  Skin:    General: Skin is warm and dry.  Neurological:     Mental Status: He is alert.     Comments: Patient has dementia.  Unable to test orientation  Psychiatric:     Comments: No overt  agitation otherwise unable to perform mental status exam     Vital Signs: BP (!) 180/90 (BP Location: Right Arm)   Pulse 76   Temp 98.6 F (37 C) (Axillary)   Resp 17   Wt 84.5 kg   SpO2 95%   BMI 25.27 kg/m  Pain Scale: 0-10   Pain Score: 0-No pain   SpO2: SpO2: 95 % O2 Device:SpO2: 95 % O2 Flow Rate: .   IO:  Intake/output summary:   Intake/Output Summary (Last 24 hours) at 06/18/2018 1324 Last data filed at 06/18/2018 0944 Gross per 24 hour  Intake 1650.94 ml  Output -  Net 1650.94 ml    LBM: Last BM Date: (Unable to assess) Baseline Weight: Weight: 84.5 kg Most recent weight: Weight: 84.5 kg     Palliative Assessment/Data:   Flowsheet Rows     Most Recent Value  Intake Tab  Referral Department  Hospitalist  Unit at Time of Referral  Med/Surg Unit  Palliative Care Primary Diagnosis  Other (Comment)  Date Notified  06/17/18  Palliative Care Type  New Palliative care  Reason for referral  Clarify Goals of Care, Psychosocial or Spiritual support  Date of Admission  06/15/18  Date first seen by Palliative Care  06/18/18  # of days Palliative referral response time  1 Day(s)  # of days IP prior to Palliative referral  2  Clinical Assessment  Palliative Performance Scale Score  40%  Pain Max last 24 hours  Not able to report  Pain Min Last 24 hours  Not able to report  Dyspnea Max Last 24 Hours  Not able to report  Dyspnea Min Last 24 hours  Not able to report  Nausea Max Last 24 Hours  Not able to report  Nausea Min Last 24 Hours  Not able to report  Anxiety Max Last 24 Hours  Not able to report  Anxiety Min Last 24 Hours  Not able to report  Other Max Last 24 Hours  Not able to report  Psychosocial & Spiritual Assessment  Palliative Care Outcomes  Patient/Family meeting held?  Yes  Who was at the meeting?  wife  Patient/Family wishes: Interventions discontinued/not started   Mechanical Ventilation  Palliative Care follow-up planned  Yes, Facility      Time In: 1000 Time Out: 1110 Time Total: 70 min Greater than 50%  of this time was spent counseling and coordinating care related to the above assessment and plan. Staffed with Dr. Zigmund Daniel  Signed by: Dory Horn, NP   Please contact Palliative Medicine Team phone at [phone] for questions and concerns.  For  individual provider: See Shea Evans

## 2018-06-18 NOTE — Plan of Care (Signed)
Progressing towards goals

## 2018-06-18 NOTE — Progress Notes (Addendum)
RN not notified by NT; MEWS score was yellow

## 2018-06-18 NOTE — Progress Notes (Signed)
PROGRESS NOTE    Randy Sullivan  YBO:[id number] DOB: [birth year] DOA: 06/15/2018 PCP: Prince Solian, MD  Brief Narrative:85 year old male with a history of dementia, recurrent syncope, hypertension, hyperlipidemia, hypothyroidism, OSA, CKD stage III presenting with confusion and transient alteration of the level of consciousness. The patient has had an extensive work-up for recurrent syncope in the past including an implanted loop recorder which has not shown any dysrhythmia. The patient is also had multiple EEGs in the past. He has been following up with an outpatient neurologist, Dr. Duanne Guess does not feel that the patient'sepisodes are consistent with seizure. Because of the patient's dementia, he is unable to provide any significant history. According to the patient's wife, the patient has had a gradual progressive functional and cognitive decline over the past year. He has required increasing assistance with his ADLs. The patient visited the emergency department on 06/14/2018 with another "syncopal"episode. It was felt to be vasovagal in etiology, and the patient was sent home in stable condition from the emergency department. He presented again on 06/15/2018 again with a transient alteration of his level of consciousness. According to the patient's wife, the patient was sitting at the dinner table eating breakfast. She noticed that the patient was getting "more sleepy and lethargic". Subsequently, the patient's wife went over to history and eased him to the right side where he subsequently slumped over. There was no tonic-clonic activity. There was no bowel or bladder incontinence. She stated he was still breathing spontaneously. EMS was activated. Upon arrival, the patient was arousable but lethargic and confused. As result, the patient was brought to emergency department for further evaluation. In the ED, the patient was afebrile hemodynamically stable saturating 97% on room  air. 06/14/2018 CT of the brain was negative for any acute findings. Neurology was consulted to assist with management  Pertinent labs:Chemistry reveals sodium of 120, potassium of 4, chloride 96, CO2 20, BUN of 14, creatinine of 1.4 with blood sugar of 124. C reveals WBC of 8, hemoglobin of 11.4, hematocrit of 33.7, MCV of 95.2 platelet count of 228.   Assessment & Plan:   Active Problems:   Hyponatremia   Acute metabolic encephalopathy   CKD (chronic kidney disease) stage 3, GFR 30-59 ml/min (HCC)   Dementia with behavioral disturbance (HCC)   Failure to thrive in adult   Hyponatremia:most likely nutritional ... Sodium today is 128 down from 130 TSH normal.  cortisol 9.2 Cautious hydration Recheck labs tomorrow  Failure to thrive: Patient seems to be failing at home Patient has been falling. Consult physical and Occupational Therapy Consult case management Possible placement, short-term versus long-term. Appreciate palliative care  Restarted Megace for appetite  Recurrent falls: wife wants to take him home...wants to arrange for help at home ...  Dementia with worsening confusion: Continue to assess. No behavioral problems at thistime.  Chronic kidney disease stage III, stable: Continue to monitor closely while on IV fluid  Hypertension: Continue to optimize.  DVT prophylaxis:Subacute Lovenox Code Status: DO NOT RESUSCITATE Family Communication: Discussed with his wife Disposition Plan: SNF once sodium improves  consults called:None for now    Estimated body mass index is 25.27 kg/m as calculated from the following:   Height as of 01/16/18: 6' (1.829 m).   Weight as of this encounter: 84.5 kg.    Subjective: Resting in bed pleasant does not know where he is...thinks he is playing with rabbits..  Objective: Vitals:   06/18/18 0023 06/18/18 0358 06/18/18 0853 06/18/18 1128  BP: Marland Kitchen)  141/67 139/76 (!) 174/99   Pulse: 88 89 97 76  Resp: 18  20 18 17   Temp: 98.3 F (36.8 C) 98.1 F (36.7 C) 98.9 F (37.2 C) 98.6 F (37 C)  TempSrc: Oral Oral Oral Axillary  SpO2: 98% 99% 94% 95%  Weight:        Intake/Output Summary (Last 24 hours) at 06/18/2018 1132 Last data filed at 06/18/2018 0944 Gross per 24 hour  Intake 1720.94 ml  Output --  Net 1720.94 ml   Filed Weights   06/15/18 1300 06/15/18 1415  Weight: 84.5 kg 84.5 kg    Examination:  General exam: Appears calm and comfortable  Respiratory system: Clear to auscultation. Respiratory effort normal. Cardiovascular system: S1 & S2 heard, RRR. No JVD, murmurs, rubs, gallops or clicks. No pedal edema. Gastrointestinal system: Abdomen is nondistended, soft and nontender. No organomegaly or masses felt. Normal bowel sounds heard. Central nervous system: pleasantly confused  Extremities: Symmetric 5 x 5 power. Skin: No rashes, lesions or ulcers    Data Reviewed: I have personally reviewed following labs and imaging studies  CBC: Recent Labs  Lab 06/14/18 1620 06/15/18 1359 06/15/18 1823 06/16/18 0333  WBC 7.9 8.0 6.9 7.9  NEUTROABS 4.1 3.7  --   --   HGB 11.3* 11.4* 11.0* 10.9*  HCT 32.6* 33.7* 30.7* 30.5*  MCV 93.7 95.2 91.1 91.0  PLT 241 228 230 892   Basic Metabolic Panel: Recent Labs  Lab 06/14/18 1620 06/15/18 1359 06/15/18 1403 06/15/18 1823 06/16/18 0333 06/17/18 0422 06/18/18 0714  NA 127* 128*  --   --  130* 127* 128*  K 4.1 4.0  --   --  3.8 4.0 3.7  CL 95* 96*  --   --  97* 94* 97*  CO2 23 20*  --   --  19* 18* 20*  GLUCOSE 99 124*  --   --  96 102* 97  BUN 17 14  --   --  16 16 18   CREATININE 1.26* 1.41* 1.40* 1.25* 1.16 0.96 1.04  CALCIUM 9.3 9.5  --   --  9.2 9.2 8.6*  MG  --   --   --  2.0  --  1.8  --   PHOS  --   --   --  3.6  --   --   --    GFR: Estimated Creatinine Clearance: 54.9 mL/min (by C-G formula based on SCr of 1.04 mg/dL). Liver Function Tests: Recent Labs  Lab 06/15/18 1359  AST 26  ALT 18  ALKPHOS 54   BILITOT 0.9  PROT 6.7  ALBUMIN 4.0   No results for input(s): LIPASE, AMYLASE in the last 168 hours. Recent Labs  Lab 06/16/18 0856  AMMONIA 27   Coagulation Profile: Recent Labs  Lab 06/15/18 1359  INR 1.0   Cardiac Enzymes: No results for input(s): CKTOTAL, CKMB, CKMBINDEX, TROPONINI in the last 168 hours. BNP (last 3 results) No results for input(s): PROBNP in the last 8760 hours. HbA1C: No results for input(s): HGBA1C in the last 72 hours. CBG: Recent Labs  Lab 06/14/18 1620 06/15/18 1356  GLUCAP 97 113*   Lipid Profile: No results for input(s): CHOL, HDL, LDLCALC, TRIG, CHOLHDL, LDLDIRECT in the last 72 hours. Thyroid Function Tests: Recent Labs    06/16/18 0856  TSH 1.614  FREET4 1.55   Anemia Panel: Recent Labs    06/16/18 0856  VITAMINB12 986*  FOLATE 14.8   Sepsis Labs: No results for input(s):  PROCALCITON, LATICACIDVEN in the last 168 hours.  Recent Results (from the past 240 hour(s))  Urine culture     Status: None   Collection Time: 06/15/18  3:13 PM  Result Value Ref Range Status   Specimen Description URINE, CATHETERIZED  Final   Special Requests NONE  Final   Culture   Final    NO GROWTH Performed at [facility], [address], [place], [place] [address]    Report Status 06/16/2018 FINAL  Final  Novel Coronavirus, NAA (hospital order; send-out to ref lab)     Status: None   Collection Time: 06/16/18  9:09 AM  Result Value Ref Range Status   SARS-CoV-2, NAA NOT DETECTED NOT DETECTED Final    Comment: (NOTE) This test was developed and its performance characteristics determined by [employer]. This test has not been FDA cleared or approved. This test has been authorized by FDA under an Emergency Use Authorization (EUA). This test has been validated in accordance with the FDA's Guidance Document (Policy for [place] in Laboratories Certified to Perform High Complexity Testing under CLIA prior to Emergency  Use Authorization for Coronavirus [id number] during the [facility] Emergency) issued on February 29th, 2020. FDA independent review of this validation is pending. This test is only authorized for the duration of time the declaration that circumstances exist justifying the authorization of the emergency use of in vitro diagnostic tests for detection of SARS-CoV- 2 virus and/or diagnosis of COVID-19 infection under section 564(b)(1) of the Act, 21 U.S.C. 956OZH-0(Q)(6), unless the authorization is terminated or revoked sooner. Performed At: [facility] [address] [place], [place] [id number] Rush Farmer MD BM:[id number]    [place]  Final    Comment: Performed at [facility], [place] [address], [place],  [address]         Radiology Studies: No results found.      Scheduled Meds:  aspirin EC  81 mg Oral Daily   cholecalciferol  1,000 Units Oral Daily   enoxaparin (LOVENOX) injection  40 mg Subcutaneous Q24H   gabapentin  100 mg Oral QHS   [START ON 06/20/2018] iron polysaccharides  150 mg Oral Q Tue   levothyroxine  112 mcg Oral QAC breakfast   loratadine  10 mg Oral QHS   megestrol  200 mg Oral Daily   mirabegron ER  50 mg Oral QPM   montelukast  10 mg Oral QHS   pantoprazole  40 mg Oral Daily   propranolol  20 mg Oral BID   vitamin B-12  1,000 mcg Oral Daily   Continuous Infusions:  sodium chloride 50 mL/hr at 06/18/18 0153     LOS: 3 days     Georgette Shell, MD [facility] If 7PM-7AM, please contact night-coverage [email or web address] Password [facility] 06/18/2018, 11:32 AM

## 2018-06-18 NOTE — Care Management (Cosign Needed Addendum)
    Durable Medical Equipment  (From admission, onward)         Start     Ordered   06/18/18 1023  For home use only DME Hospital bed  Once    Question Answer Comment  Patient has (list medical condition): acute metabolic encephalopathy, failure to thrive, poor oral intake, hyponatremia   The above medical condition requires: Patient requires the ability to reposition frequently   Head must be elevated greater than: 30 degrees   Bed type Semi-electric   Hoyer Lift Yes   Support Surface: Gel Overlay      06/18/18 1025

## 2018-06-18 NOTE — Evaluation (Signed)
Clinical/Bedside Swallow Evaluation Patient Details  Name: Randy Sullivan MRN: [id number] Date of Birth: 04-11-1928  Today's Date: 06/18/2018 Time: SLP Start Time (ACUTE ONLY): 39 SLP Stop Time (ACUTE ONLY): 1100 SLP Time Calculation (min) (ACUTE ONLY): 20 min  Past Medical History:  Past Medical History:  Diagnosis Date  . BPH (benign prostatic hyperplasia)   . Cancer ([place])    non melanoma skin cancers removed  . DJD (degenerative joint disease)   . Familial tremor   . GERD (gastroesophageal reflux disease)   . Heart murmur    hx of  . Hyperlipidemia   . Hypertension   . Hypothyroidism   . Mood disorder ([place])   . Osteoarthritis   . Prostatism   . Shortness of breath    mild with excertion  . Sleep apnea    cpap  . Syncope 01/13/2017  . Thyroid disease    Past Surgical History:  Past Surgical History:  Procedure Laterality Date  . APPENDECTOMY    . EYE SURGERY     bil cataracts  . HEMORROIDECTOMY    . INNER EAR SURGERY Right    With impant.   Marland Kitchen LOOP RECORDER INSERTION N/A 10/06/2017   Procedure: LOOP RECORDER INSERTION;  Surgeon: Sanda Klein, MD;  Location: [facility];  Service: Cardiovascular;  Laterality: N/A;  . TONSILLECTOMY    . TOTAL KNEE ARTHROPLASTY Left 11/08/2016   Procedure: LEFT TOTAL KNEE ARTHROPLASTY;  Surgeon: Gaynelle Arabian, MD;  Location: [facility];  Service: Orthopedics;  Laterality: Left;  Adductor Block   HPI:  85 year old male admitted 06/15/2018 with confusion, transient altered LOC. PMH: dementia with progressive functional and cognitive decline over the past year, recurrent syncope, HTN, HLD, hypothyroidism, OSA, CKD III. Head CT = no acute intracranial abnormality. CXR = unchanged low lung volumes with mild BLL atelectasis.    Assessment / Plan / Recommendation Clinical Impression  Pt presents with largely cognitively based swallowing difficulty, characterized by poor awareness of oral bolus with extended oral prep on solids. Thin  liquid and purees were tolerated well with no anterior leakage and no oral residue. Pt exhibits no overt s/s aspiration with any consistency, and had minimal oral residue after trials of graham cracker. SLP spoke with pt's wife via telephone. She reported pt had occasional difficulty with solids prior to admit. At this time, will downgrade diet to dys 2 (finely chopped) with thin liquid, crushed meds, 1:1 assistance with self feeding at mealtime. SLP will follow up for assessment of diet tolerance and education. Safe swallow precautions posted at [facility]. RN and MD informed of results and recommendations.    SLP Visit Diagnosis: Dysphagia, unspecified (R13.10)    Aspiration Risk  Mild aspiration risk    Diet Recommendation Dysphagia 2 (Fine chop);Thin liquid   Liquid Administration via: Straw Medication Administration: Crushed with puree Supervision: Full supervision/cueing for compensatory strategies;Staff to assist with self feeding Compensations: Minimize environmental distractions;Slow rate;Small sips/bites Postural Changes: Seated upright at 90 degrees;Remain upright for at least 30 minutes after po intake    Other  Recommendations Oral Care Recommendations: Oral care BID   Follow up Recommendations 24 hour supervision/assistance      Frequency and Duration min 1 x/week  1 week;2 weeks       Prognosis Prognosis for Safe Diet Advancement: Fair Barriers to Reach Goals: Cognitive deficits      Swallow Study   General Date of Onset: 06/15/18 HPI: 85 year old male admitted 06/15/2018 with confusion, transient  altered LOC. PMH: dementia with progressive functional and cognitive decline over the past year, recurrent syncope, HTN, HLD, hypothyroidism, OSA, CKD III. Head CT = no acute intracranial abnormality. CXR = unchanged low lung volumes with mild BLL atelectasis.  Type of Study: Bedside Swallow Evaluation Previous Swallow Assessment: MBS May 2018 - suspect primary esophageal  dysphagia. No penetration or aspiration. Diet Prior to this Study: Regular;Thin liquids Temperature Spikes Noted: No Respiratory Status: Room air History of Recent Intubation: No Behavior/Cognition: Alert;Cooperative Oral Cavity Assessment: Within Functional Limits Oral Care Completed by SLP: No Oral Cavity - Dentition: Adequate natural dentition Vision: Functional for self-feeding Self-Feeding Abilities: Needs assist;Needs set up Patient Positioning: Upright in bed Baseline Vocal Quality: Low vocal intensity Volitional Cough: Strong Volitional Swallow: Able to elicit    Oral/Motor/Sensory Function Overall Oral Motor/Sensory Function: Within functional limits   Ice Chips Ice chips: Within functional limits Presentation: Spoon   Thin Liquid Thin Liquid: Within functional limits Presentation: Straw    Nectar Thick Nectar Thick Liquid: Not tested   Honey Thick Honey Thick Liquid: Not tested   Puree Puree: Within functional limits Presentation: Spoon   Solid     Solid: Impaired Oral Phase Impairments: Reduced lingual movement/coordination Oral Phase Functional Implications: Prolonged oral transit;Oral residue      B. Quentin Ore [facility], [place] Speech Language Pathologist [phone]  Shonna Chock 06/18/2018,11:03 AM

## 2018-06-19 LAB — BASIC METABOLIC PANEL
Anion gap: 10 (ref 5–15)
BUN: 18 mg/dL (ref 8–23)
CO2: 21 mmol/L — ABNORMAL LOW (ref 22–32)
Calcium: 8.6 mg/dL — ABNORMAL LOW (ref 8.9–10.3)
Chloride: 99 mmol/L (ref 98–111)
Creatinine, Ser: 1.07 mg/dL (ref 0.61–1.24)
GFR calc Af Amer: >60 mL/min (ref >60)
GFR calc non Af Amer: >60 mL/min (ref >60)
Glucose, Bld: 92 mg/dL (ref 70–99)
Potassium: 3.6 mmol/L (ref 3.5–5.1)
Sodium: 130 mmol/L — ABNORMAL LOW (ref 135–145)

## 2018-06-19 LAB — CBC
HCT: 29.1 % — ABNORMAL LOW (ref 39.0–52.0)
Hemoglobin: 10.5 g/dL — ABNORMAL LOW (ref 13.0–17.0)
MCH: 32.8 pg (ref 26.0–34.0)
MCHC: 36.1 g/dL — ABNORMAL HIGH (ref 30.0–36.0)
MCV: 90.9 fL (ref 80.0–100.0)
Platelets: 237 10*3/uL (ref 150–400)
RBC: 3.2 MIL/uL — ABNORMAL LOW (ref 4.22–5.81)
RDW: 12.7 % (ref 11.5–15.5)
WBC: 10.8 10*3/uL — ABNORMAL HIGH (ref 4.0–10.5)
nRBC: 0 % (ref 0.0–0.2)

## 2018-06-19 MED ORDER — MEGESTROL ACETATE 400 MG/10ML PO SUSP: 200.0000 mg | Freq: Every day | ORAL | 0 refills | Status: AC

## 2018-06-19 NOTE — Progress Notes (Signed)
Physical Therapy Treatment Patient Details Name: Randy Sullivan MRN: [id number] DOB: 1928/09/30 Today's Date: 06/19/2018    History of Present Illness Randy Sullivan is a 85 y.o. male with history of multiple syncopal events, sleep apnea, hypothyroidism, hypertension, hyperlipidemia, familial tremor sleep disorder-periodic limb movement disorder and nonmelanoma skin cancer which has been removed.  He has a syncopal episode on 4/22 and CT of the head without any acute intracranial abnormality.    PT Comments    Pt required total assist +2 to perform bed mobility and EOB activity. Pt with heavy R lateral leaning in sitting, and pt unable to correct sitting balance and posture without max assist from PT and OT. Pt with continued confusion this session, and was limited by restlessness and guarding. PT continuing to recommend SNF placement to address mobility deficits.    Follow Up Recommendations  SNF;Supervision/Assistance - 24 hour     Equipment Recommendations  None recommended by PT    Recommendations for Other Services       Precautions / Restrictions Precautions Precautions: Fall Restrictions Weight Bearing Restrictions: No    Mobility  Bed Mobility Overal bed mobility: Needs Assistance Bed Mobility: Supine to Sit;Sit to Supine;Rolling Rolling: Total assist;+2 for physical assistance;+2 for safety/equipment   Supine to sit: Total assist;+2 for physical assistance;+2 for safety/equipment Sit to supine: +2 for physical assistance;Total assist;+2 for safety/equipment   General bed mobility comments: total assist +2 for trunk elevation/lowering, LE management, and sitting balance EOB. pt pushing against therapists once sitting EOB with heavy R lateral lean; no initation even with multimodal cueing. PT with trunk and pelvic facilitation to correct R lateral lean, pt unable to sustain postural adjustments. After sitting balance intervention, pt required total assist to roll bilaterally for  placement of fresh pad, current pad soiled with urine.   Transfers Overall transfer level: (NT - Pt required total assist +2 for bed mobility, resistant to mobility today)               General transfer comment: deferred due to safety   Ambulation/Gait                 Stairs             Wheelchair Mobility    Modified Rankin (Stroke Patients Only)       Balance Overall balance assessment: Needs assistance Sitting-balance support: Bilateral upper extremity supported;Feet supported Sitting balance-[name]: Poor Sitting balance - Comments: propping on R elbow upon sitting EOB; PT/OT able to correct with facilitation of trunk and pelvis, but pt unable to sustain upright posture without therapist assist. Pt sat EOB ~10 minutes Postural control: Right lateral lean                                  Cognition Arousal/Alertness: Lethargic Behavior During Therapy: Flat affect;Impulsive;Restless Overall Cognitive Status: Impaired/Different from baseline Area of Impairment: Orientation;Attention;Memory;Following commands;Safety/judgement;Awareness;Problem solving                 Orientation Level: Disoriented to;Place;Situation;Time(able to recall name/birthday today) Current Attention Level: Sustained Memory: Decreased short-term memory;Decreased recall of precautions Following Commands: Follows one step commands with increased time;Follows one step commands inconsistently Safety/Judgement: Decreased awareness of safety;Decreased awareness of deficits Awareness: Intellectual Problem Solving: Slow processing;Decreased initiation;Difficulty sequencing;Requires verbal cues;Requires tactile cues General Comments: pt able to confirm his name and birthday, but reports "hes coming home from work". improved alertness with mobility but  resistive to engagement throughout session. Pt grabbing onto PT and OT hands during session, difficulty with letting go  when asked.        Exercises      General Comments        Pertinent Vitals/Pain Pain Assessment: No/denies pain    Home Living                      Prior Function            PT Goals (current goals can now be found in the care plan section) Acute Rehab PT Goals Patient Stated Goal: none stated PT Goal Formulation: Patient unable to participate in goal setting Time For Goal Achievement: 06/30/18 Potential to Achieve Goals: Fair Progress towards PT goals: Not progressing toward goals - comment(increased assist needs today )    Frequency    Min 2X/week      PT Plan Current plan remains appropriate    Co-evaluation PT/OT/SLP Co-Evaluation/Treatment: Yes Reason for Co-Treatment: Necessary to address cognition/behavior during functional activity;For patient/therapist safety;To address functional/ADL transfers PT goals addressed during session: Mobility/safety with mobility;Balance OT goals addressed during session: ADL's and self-care      AM-PAC PT "6 Clicks" Mobility   Outcome Measure  Help needed turning from your back to your side while in a flat bed without using bedrails?: Total Help needed moving from lying on your back to sitting on the side of a flat bed without using bedrails?: Total Help needed moving to and from a bed to a chair (including a wheelchair)?: Total Help needed standing up from a chair using your arms (e.g., wheelchair or bedside chair)?: Total Help needed to walk in hospital room?: Total Help needed climbing 3-5 steps with a railing? : Total 6 Click Score: 6    End of Session   Activity Tolerance: Patient limited by fatigue;Other (comment)(Pt resistant to mobility) Patient left: with call bell/phone within reach;in bed;with bed alarm set Nurse Communication: Mobility status PT Visit Diagnosis: Muscle weakness (generalized) (M62.81);Other abnormalities of gait and mobility (R26.89)     Time: 3335-4562 PT Time Calculation  (min) (ACUTE ONLY): 24 min  Charges:  $Neuromuscular Re-education: 8-22 mins                    Blayton Huttner Conception Chancy, PT Acute Rehabilitation Services Pager [phone]  Office [phone]    Ahonesty Woodfin D Brieana Shimmin 06/19/2018, 11:16 AM

## 2018-06-19 NOTE — Progress Notes (Addendum)
Occupational Therapy Treatment Patient Details Name: Randy Sullivan MRN: [id number] DOB: Oct 09, 1928 Today's Date: 06/19/2018    History of present illness Randy Sullivan is a 85 y.o. male with history of multiple syncopal events, sleep apnea, hypothyroidism, hypertension, hyperlipidemia, familial tremor sleep disorder-periodic limb movement disorder and nonmelanoma skin cancer which has been removed.  He has a syncopal episode on 4/22 and CT of the head without any acute intracranial abnormality.   OT comments  Patient supine in bed, see as cotx with PT.  Patient requires total assist +2 for bed mobility and mod-max assist to maintain sitting EOB with heavy R lateral lean and total assist +2 in order to attempt to correct with limited success.  Pt resistant to mobility and ADL engagement today, increased time required to follow simple 1 step commands with fair success; slight agitation noted.  Able to confirm his name and state his birthday, but otherwise remains disoriented. Continue to recommend SNF at dc.  Will follow.     Follow Up Recommendations  SNF;Supervision/Assistance - 24 hour    Equipment Recommendations  Other (comment)(defer to next venue of care)    Recommendations for Other Services Other (comment)(palliative care)    Precautions / Restrictions Precautions Precautions: Fall Restrictions Weight Bearing Restrictions: No       Mobility Bed Mobility Overal bed mobility: Needs Assistance Bed Mobility: Supine to Sit;Sit to Supine     Supine to sit: Total assist;+2 for physical assistance;+2 for safety/equipment Sit to supine: +2 for physical assistance;Total assist;+2 for safety/equipment   General bed mobility comments: pt requires total assist for bed mobility today, pt pushing against therapists once sitting EOB with heavy R lateral lean; no initation even with multimodal cueing   Transfers                 General transfer comment: deferred due to safety      Balance Overall balance assessment: Needs assistance Sitting-balance support: Bilateral upper extremity supported;Feet supported Sitting balance-[name]: Poor   Postural control: Right lateral lean                                 ADL either performed or assessed with clinical judgement   ADL Overall ADL's : Needs assistance/impaired     Grooming: Total assistance;Bed level Grooming Details (indicate cue type and reason): pt resistive to washing face today, hand over hand support required                   Toilet Transfer Details (indicate cue type and reason): deferred due to safety Toileting- Clothing Manipulation and Hygiene: Total assistance;Bed level Toileting - Clothing Manipulation Details (indicate cue type and reason): total assist to roll and remove soiled bed pads from under pt     Functional mobility during ADLs: Total assistance;+2 for physical assistance;+2 for safety/equipment General ADL Comments: cognition, balance, awareness, all impacting independence in ADL     Vision       Perception     Praxis      Cognition Arousal/Alertness: Lethargic Behavior During Therapy: Flat affect;Impulsive Overall Cognitive Status: Impaired/Different from baseline Area of Impairment: Orientation;Attention;Memory;Following commands;Safety/judgement;Awareness;Problem solving                 Orientation Level: Disoriented to;Place;Situation;Time(able to recall name/birthday today) Current Attention Level: Sustained Memory: Decreased short-term memory;Decreased recall of precautions Following Commands: Follows one step commands with increased time;Follows one step commands inconsistently Safety/Judgement:  Decreased awareness of safety;Decreased awareness of deficits Awareness: Intellectual Problem Solving: Slow processing;Decreased initiation;Difficulty sequencing;Requires verbal cues;Requires tactile cues General Comments: pt able to confirm  his name and birthday, but reports "hes coming home from work". improved alertness with mobility but resistive to engagement throughout session         Exercises     Shoulder Instructions       General Comments      Pertinent Vitals/ Pain       Pain Assessment: No/denies pain  Home Living                                          Prior Functioning/Environment              Frequency  Min 2X/week        Progress Toward Goals  OT Goals(current goals can now be found in the care plan section)  Progress towards OT goals: Not progressing toward goals - comment(pt not following commands and resistive to ADLs/mobility tod)  Acute Rehab OT Goals Patient Stated Goal: none stated  Plan Discharge plan remains appropriate;Frequency remains appropriate    Co-evaluation    PT/OT/SLP Co-Evaluation/Treatment: Yes Reason for Co-Treatment: Necessary to address cognition/behavior during functional activity;For patient/therapist safety;To address functional/ADL transfers   OT goals addressed during session: ADL's and self-care      AM-PAC OT "6 Clicks" Daily Activity     Outcome Measure   Help from another person eating meals?: Total Help from another person taking care of personal grooming?: Total Help from another person toileting, which includes using toliet, bedpan, or urinal?: Total Help from another person bathing (including washing, rinsing, drying)?: Total Help from another person to put on and taking off regular upper body clothing?: Total Help from another person to put on and taking off regular lower body clothing?: Total 6 Click Score: 6    End of Session    OT Visit Diagnosis: Unsteadiness on feet (R26.81);Other abnormalities of gait and mobility (R26.89);Repeated falls (R29.6);History of falling (Z91.81);Muscle weakness (generalized) (M62.81);Other symptoms and signs involving cognitive function   Activity Tolerance Treatment limited  secondary to agitation   Patient Left in bed;with call bell/phone within reach;with bed alarm set   Nurse Communication Mobility status        Time: 2025-4270 OT Time Calculation (min): 24 min  Charges: OT General Charges $OT Visit: 1 Visit OT Treatments $Self Care/Home Management : 8-22 mins  Delight Stare, [place] Pager [phone] Office [phone]     Delight Stare 06/19/2018, 11:08 AM

## 2018-06-19 NOTE — Consult Note (Addendum)
   [name] Inpatient Consult   06/19/2018  Randy Sullivan [birth year] [id number]  We have reviewed your referral request patient meets criteria for [facility] Care Management programs for post hospital community follow up needs.  Patient with Medicare noted. Primary Care Provider:   Prince Solian, MD  Chart review of Palliative consult was noted as well for plans for the patient to return home with possible community base palliative .  For questions, please contact:  Natividad Brood, RN BSN [facility] Liaison  [phone] business mobile phone Toll free office [phone]

## 2018-06-19 NOTE — Discharge Summary (Signed)
Physician Discharge Summary  Randy Sullivan JGG:[id number] DOB: 18-Jul-1928 DOA: 06/15/2018  PCP: Prince Solian, MD  Admit date: 06/15/2018 Discharge date: 06/19/2018  Admitted Fromhome Disposition:home  Recommendations for Outpatient Follow-up:  1. Follow up with PCP in 1-2 weeks 2. Please obtain BMP/CBC in one week  [place]  Equipment/Devices hospital bed  Discharge Condition stable and improved  CODE STATUS dnr Diet recommendation: dysphagia 2   Brief/Interim Summary:85 year old male with a history of dementia, recurrent syncope, hypertension, hyperlipidemia, hypothyroidism, OSA, CKD stage III presenting with confusion and transient alteration of the level of consciousness. The patient has had an extensive work-up for recurrent syncope in the past including an implanted loop recorder which has not shown any dysrhythmia. The patient is also had multiple EEGs in the past. He has been following up with an outpatient neurologist, Dr. Duanne Guess does not feel that the patient'sepisodes are consistent with seizure. Because of the patient's dementia, he is unable to provide any significant history. According to the patient's wife, the patient has had a gradual progressive functional and cognitive decline over the past year. He has required increasing assistance with his ADLs. The patient visited the emergency department on 06/14/2018 with another "syncopal"episode. It was felt to be vasovagal in etiology, and the patient was sent home in stable condition from the emergency department. He presented again on 06/15/2018 again with a transient alteration of his level of consciousness. According to the patient's wife, the patient was sitting at the dinner table eating breakfast. She noticed that the patient was getting "more sleepy and lethargic". Subsequently, the patient's wife went over to history and eased him to the right side where he subsequently slumped over. There was  no tonic-clonic activity. There was no bowel or bladder incontinence. She stated he was still breathing spontaneously. EMS was activated. Upon arrival, the patient was arousable but lethargic and confused. As result, the patient was brought to emergency department for further evaluation. In the ED, the patient was afebrile hemodynamically stable saturating 97% on room air. 06/14/2018 CT of the brain was negative for any acute findings. Neurology was consulted to assist with management Pertinent labs:Chemistry reveals sodium of 120, potassium of 4, chloride 96, CO2 20, BUN of 14, creatinine of 1.4 with blood sugar of 124. C reveals WBC of 8, hemoglobin of 11.4, hematocrit of 33.7, MCV of 95.2 platelet count of 228.   Discharge Diagnoses:  Active Problems:   Hyponatremia   Acute metabolic encephalopathy   CKD (chronic kidney disease) stage 3, GFR 30-59 ml/min (HCC)   Dementia with behavioral disturbance (HCC)   Failure to thrive in adult   Goals of care, counseling/discussion   Dysphagia   Palliative care by specialist   Hyponatremia: likely nutritional ... Sodium today is  130 TSHnormal. cortisol 9.2  Failure to thrive: Patient seems to be failing at home Patient has been falling.  Patient was seen by physical therapy and recommended skilled nursing facility however his wife wanted to take him home and care for him at home.  Hospital bed will be arranged on DC this patient needs to be 45 degrees while eating and frequent positioning needed to avoid pressure ulcers. Restarted Megace for appetite Patient was seen by palliative care during this hospital stay patient will need outpatient follow-up with palliative care which is being arranged by case manager.  Recurrent falls:wife wants to take him home...wants to arrange for help at home ...  Dementia-patient is pleasantly confused no behavioral issues at this time.  Chronic kidney disease stage III,  stable:  Hypertension: Stable continue home meds    Estimated body mass index is 25.27 kg/m as calculated from the following:   Height as of 01/16/18: 6' (1.829 m).   Weight as of this encounter: 84.5 kg.  Discharge Instructions  Discharge Instructions    DME Hospital bed   Complete by:  As directed    The above medical condition requires:  Patient requires the ability to reposition frequently   Head must be elevated greater than:  45 degrees   Bed type:  Semi-electric     Allergies as of 06/19/2018      Reactions   Tizanidine Other (See Comments)   Hypotension       Medication List    TAKE these medications   acetaminophen 500 MG tablet Commonly known as:  TYLENOL Take 1,000 mg by mouth 2 (two) times daily with a meal.   aspirin 81 MG tablet Take 81 mg by mouth daily.   gabapentin 100 MG capsule Commonly known as:  NEURONTIN Take 100 mg by mouth at bedtime.   iron polysaccharides 150 MG capsule Commonly known as:  NIFEREX Take 150 mg by mouth every Tuesday.   levothyroxine 112 MCG tablet Commonly known as:  SYNTHROID Take 112 mcg by mouth daily before breakfast.   loratadine 10 MG tablet Commonly known as:  CLARITIN Take 10 mg by mouth at bedtime.   megestrol 400 MG/10ML suspension Commonly known as:  MEGACE Take 5 mLs (200 mg total) by mouth daily.   montelukast 10 MG tablet Commonly known as:  SINGULAIR Take 1 tablet (10 mg total) by mouth at bedtime.   Myrbetriq 50 MG Tb24 tablet Generic drug:  mirabegron ER Take 50 mg by mouth every evening.   omeprazole 20 MG capsule Commonly known as:  PRILOSEC Take 20 mg by mouth daily.   propranolol 20 MG tablet Commonly known as:  INDERAL Take 20 mg by mouth 2 (two) times daily.   saccharomyces boulardii 250 MG capsule Commonly known as:  FLORASTOR Take 250 mg by mouth at bedtime.   SYSTANE OP Place 1-2 drops into both eyes as needed (for dry eyes).   triamcinolone cream 0.1 % Commonly known  as:  KENALOG Apply 1 application topically daily as needed for itching.   vitamin B-12 1000 MCG tablet Commonly known as:  CYANOCOBALAMIN Take 1,000 mcg by mouth daily.   Vitamin D-3 25 MCG (1000 UT) Caps Take 1,000 Units by mouth daily.            Durable Medical Equipment  (From admission, onward)         Start     Ordered   06/18/18 1023  For home use only DME Hospital bed  Once    Question Answer Comment  Patient has (list medical condition): acute metabolic encephalopathy, failure to thrive, poor oral intake, hyponatremia   The above medical condition requires: Patient requires the ability to reposition frequently   Head must be elevated greater than: 30 degrees   Bed type Semi-electric   Hoyer Lift Yes   Support Surface: Gel Overlay      06/18/18 1025   06/18/18 0000  DME Hospital bed    Question Answer Comment  The above medical condition requires: Patient requires the ability to reposition frequently   Head must be elevated greater than: 45 degrees   Bed type Semi-electric      06/18/18 1146  Follow-up Information    Avva, Ravisankar, MD Follow up.   Specialty:  Internal Medicine Contact information: [place] [address] [phone]        Sanda Klein, MD .   Specialty:  Cardiology Contact information: [address] [place] [address] [place] [address] [phone]          Allergies  Allergen Reactions  . Tizanidine Other (See Comments)    Hypotension     Consultations:  None  Procedures/Studies: Ct Head Wo Contrast  Result Date: 06/14/2018 CLINICAL DATA:  85 year old male with syncope EXAM: CT HEAD WITHOUT CONTRAST TECHNIQUE: Contiguous axial images were obtained from the base of the skull through the vertex without intravenous contrast. COMPARISON:  03/25/2018, 04/15/2017 FINDINGS: Brain: No acute intracranial hemorrhage. No midline shift or mass effect. Gray-white differentiation maintained.  Senescent volume loss. Unremarkable appearance of the ventricular system. Patchy hypodensity in the periventricular white matter. Vascular: Mild intracranial atherosclerosis. Skull: No acute fracture.  No aggressive bone lesion identified. Sinuses/Orbits: Mucosal disease of the sphenoid sinus. Unremarkable orbits Other: None IMPRESSION: No acute intracranial abnormality. Evidence of chronic microvascular ischemic disease. Electronically Signed   By: Corrie Mckusick D.O.   On: 06/14/2018 17:23   Ct Head Code Stroke Wo Contrast  Result Date: 06/15/2018 CLINICAL DATA:  Code stroke. Left arm weakness and drift. Altered mental status. EXAM: CT HEAD WITHOUT CONTRAST TECHNIQUE: Contiguous axial images were obtained from the base of the skull through the vertex without intravenous contrast. COMPARISON:  06/14/2018 FINDINGS: Brain: There is no evidence of acute infarct, intracranial hemorrhage, mass, midline shift, or extra-axial fluid collection. Moderate cerebral atrophy is again noted. Patchy cerebral white matter hypodensities are unchanged and nonspecific but compatible with moderate chronic small vessel ischemic disease. Small chronic infarcts are again seen in the right thalamus and right cerebellum. Vascular: No hyperdense vessel. Skull: No fracture or focal osseous lesion. Sinuses/Orbits: Mild left sphenoid and left maxillary sinus mucosal thickening. Clear mastoid air cells. Bilateral cataract extraction. Other: None. ASPECTS [facility] Stroke Program Early CT Score) - Ganglionic level infarction (caudate, lentiform nuclei, internal capsule, insula, M1-M3 cortex): 7 - Supraganglionic infarction (M4-M6 cortex): 3 Total score (0-10 with 10 being normal): 10 IMPRESSION: 1. No evidence of acute intracranial abnormality. 2. ASPECTS is 10. 3. Moderate chronic small vessel ischemic disease and cerebral atrophy. These results were communicated to Dr. Rory Percy at 2:16 pm on 06/15/2018 by text page via the [facility] messaging  system. Electronically Signed   By: Logan Bores M.D.   On: 06/15/2018 14:18    (Echo, Carotid, EGD, Colonoscopy, ERCP)    Subjective:   Discharge Exam: Vitals:   06/19/18 0515 06/19/18 0743  BP: (!) 149/81 140/87  Pulse: 98 88  Resp: 18 16  Temp: 100 F (37.8 C) 98 F (36.7 C)  SpO2: 94% 95%   Vitals:   06/18/18 2141 06/19/18 0021 06/19/18 0515 06/19/18 0743  BP: (!) 156/76 140/75 (!) 149/81 140/87  Pulse: (!) 111 86 98 88  Resp: 18 17 18 16   Temp: 99.5 F (37.5 C) 99.3 F (37.4 C) 100 F (37.8 C) 98 F (36.7 C)  TempSrc: Oral Oral Axillary Oral  SpO2: 98% 100% 94% 95%  Weight:        General: Pt is alert, awake, not in acute distress Cardiovascular: RRR, S1/S2 +, no rubs, no gallops Respiratory: CTA bilaterally, no wheezing, no rhonchi Abdominal: Soft, NT, ND, bowel sounds + Extremities: no edema, no cyanosis    The results of  significant diagnostics from this hospitalization (including imaging, microbiology, ancillary and laboratory) are listed below for reference.     Microbiology: Recent Results (from the past 240 hour(s))  Urine culture     Status: None   Collection Time: 06/15/18  3:13 PM  Result Value Ref Range Status   Specimen Description URINE, CATHETERIZED  Final   Special Requests NONE  Final   Culture   Final    NO GROWTH Performed at [facility], [address] [place], [place] [address]    Report Status 06/16/2018 FINAL  Final  Novel Coronavirus, NAA (hospital order; send-out to ref lab)     Status: None   Collection Time: 06/16/18  9:09 AM  Result Value Ref Range Status   SARS-CoV-2, NAA NOT DETECTED NOT DETECTED Final    Comment: (NOTE) This test was developed and its performance characteristics determined by [employer]. This test has not been FDA cleared or approved. This test has been authorized by FDA under an Emergency Use Authorization (EUA). This test has been validated in accordance with the FDA's Guidance  Document (Policy for [place] in Laboratories Certified to Perform High Complexity Testing under CLIA prior to Emergency Use Authorization for Coronavirus [id number] during the [facility] Emergency) issued on February 29th, 2020. FDA independent review of this validation is pending. This test is only authorized for the duration of time the declaration that circumstances exist justifying the authorization of the emergency use of in vitro diagnostic tests for detection of SARS-CoV- 2 virus and/or diagnosis of COVID-19 infection under section 564(b)(1) of the Act, 21 U.S.C. 202RKY-7(C)(6), unless the authorization is terminated or revoked sooner. Performed At: [facility] [address] [place], [place] [id number] Rush Farmer MD [id number]    [place]  Final    Comment: Performed at [facility], [place] [address], [place], [place] [address]     Labs: BNP (last 3 results) No results for input(s): BNP in the last 8760 hours. Basic Metabolic Panel: Recent Labs  Lab 06/15/18 1359  06/15/18 1823 06/16/18 0333 06/17/18 0422 06/18/18 0714 06/19/18 0709  NA 128*  --   --  130* 127* 128* 130*  K 4.0  --   --  3.8 4.0 3.7 3.6  CL 96*  --   --  97* 94* 97* 99  CO2 20*  --   --  19* 18* 20* 21*  GLUCOSE 124*  --   --  96 102* 97 92  BUN 14  --   --  16 16 18 18   CREATININE 1.41*   < > 1.25* 1.16 0.96 1.04 1.07  CALCIUM 9.5  --   --  9.2 9.2 8.6* 8.6*  MG  --   --  2.0  --  1.8  --   --   PHOS  --   --  3.6  --   --   --   --    < > = values in this interval not displayed.   Liver Function Tests: Recent Labs  Lab 06/15/18 1359  AST 26  ALT 18  ALKPHOS 54  BILITOT 0.9  PROT 6.7  ALBUMIN 4.0   No results for input(s): LIPASE, AMYLASE in the last 168 hours. Recent Labs  Lab 06/16/18 0856  AMMONIA 27   CBC: Recent Labs  Lab 06/14/18 1620 06/15/18 1359 06/15/18 1823 06/16/18 0333 06/19/18 0709  WBC 7.9 8.0  6.9 7.9 10.8*  NEUTROABS 4.1 3.7  --   --   --  HGB 11.3* 11.4* 11.0* 10.9* 10.5*  HCT 32.6* 33.7* 30.7* 30.5* 29.1*  MCV 93.7 95.2 91.1 91.0 90.9  PLT 241 228 230 232 237   Cardiac Enzymes: No results for input(s): CKTOTAL, CKMB, CKMBINDEX, TROPONINI in the last 168 hours. BNP: Invalid input(s): POCBNP CBG: Recent Labs  Lab 06/14/18 1620 06/15/18 1356  GLUCAP 97 113*   D-Dimer No results for input(s): DDIMER in the last 72 hours. Hgb A1c No results for input(s): HGBA1C in the last 72 hours. Lipid Profile No results for input(s): CHOL, HDL, LDLCALC, TRIG, CHOLHDL, LDLDIRECT in the last 72 hours. Thyroid function studies No results for input(s): TSH, T4TOTAL, T3FREE, THYROIDAB in the last 72 hours.  Invalid input(s): FREET3 Anemia work up No results for input(s): VITAMINB12, FOLATE, FERRITIN, TIBC, IRON, RETICCTPCT in the last 72 hours. Urinalysis    Component Value Date/Time   COLORURINE YELLOW 06/15/2018 1513   APPEARANCEUR CLEAR 06/15/2018 1513   LABSPEC <1.005 (L) 06/15/2018 1513   PHURINE 7.0 06/15/2018 1513   GLUCOSEU NEGATIVE 06/15/2018 1513   HGBUR TRACE (A) 06/15/2018 1513   BILIRUBINUR NEGATIVE 06/15/2018 1513   KETONESUR NEGATIVE 06/15/2018 1513   PROTEINUR NEGATIVE 06/15/2018 1513   NITRITE NEGATIVE 06/15/2018 1513   LEUKOCYTESUR NEGATIVE 06/15/2018 1513   Sepsis Labs Invalid input(s): PROCALCITONIN,  WBC,  LACTICIDVEN Microbiology Recent Results (from the past 240 hour(s))  Urine culture     Status: None   Collection Time: 06/15/18  3:13 PM  Result Value Ref Range Status   Specimen Description URINE, CATHETERIZED  Final   Special Requests NONE  Final   Culture   Final    NO GROWTH Performed at [facility], [place] [address], [place], [place] [address]    Report Status 06/16/2018 FINAL  Final  Novel Coronavirus, NAA (hospital order; send-out to ref lab)     Status: None   Collection Time: 06/16/18  9:09 AM  Result Value Ref Range Status    SARS-CoV-2, NAA NOT DETECTED NOT DETECTED Final    Comment: (NOTE) This test was developed and its performance characteristics determined by [employer]. This test has not been FDA cleared or approved. This test has been authorized by FDA under an Emergency Use Authorization (EUA). This test has been validated in accordance with the FDA's Guidance Document (Policy for [place] in Laboratories Certified to Perform High Complexity Testing under CLIA prior to Emergency Use Authorization for Coronavirus [id number] during the [facility] Emergency) issued on February 29th, 2020. FDA independent review of this validation is pending. This test is only authorized for the duration of time the declaration that circumstances exist justifying the authorization of the emergency use of in vitro diagnostic tests for detection of SARS-CoV- 2 virus and/or diagnosis of COVID-19 infection under section 564(b)(1) of the Act, 21 U.S.C. 814GYJ-8(H)(6), unless the authorization is terminated or revoked sooner. Performed At: [facility] [address] [place], [place] [id number] Rush Farmer MD [id number]    [place]  Final    Comment: Performed at [facility], [place] [address] [place], [place] [address]     Time coordinating discharge: 33 minutes  SIGNED:   Georgette Shell, MD  [facility] 06/19/2018, 10:37 AM Pager   If 7PM-7AM, please contact night-coverage [email or web address] Password [email or web address]

## 2018-06-19 NOTE — Progress Notes (Signed)
Hydrologist [facility])  Hospital Liaison note  [facility] Palliative Services notified by Jacqualin Combes, [facility] of patient/family request for [facility] Palliative services at home after discharge.     Writer spoke with family by phone to initiate education related to palliative services in the home. Family verbalized understanding of information given. Per discussion, plan is for discharge to home today. [facility] will follow up to schedule services with our Palliative providers.    Please call with any hospice or palliative related questions.     Thank you for this referral.     Farrel [name], RN, CCM  [place] (listed on AMION under [facility] [place])  [phone]

## 2018-06-19 NOTE — TOC Transition Note (Addendum)
Transition of Care [facility]) - CM/SW Discharge Note   Patient Details  Name: ARLIND KLINGERMAN MRN: [id number] Date of Birth: 1928/06/16  Transition of Care [facility]) CM/SW Contact:  Pollie Friar, RN Phone Number: 06/19/2018, 1:23 PM   Clinical Narrative:    Recommendations are for SNF. Wife wants patient to d/c home. She has arranged 24 hour caregivers.  Once bed delivered to the home, CM will arrange PTAR for transport.  Pt to also have palliative care follow him at home. CM called the spouse and she asked to use [facility]. CM made referral. [facility] will f/u with patients PCP for orders to follow at home.  1600: CM spoke to patients wife and the DME company has delivered the bed. PTAR arranged. Bedside RN updated.    Final next level of care: Home w Home Health Services Barriers to Discharge: Barriers Resolved   Patient Goals and CMS Choice Patient states their goals for this hospitalization and ongoing recovery are:: pt's wife epressed the desire to get pt back home.  CMS Medicare.gov Compare Post Acute Care list provided to:: Patient Represenative (must comment)(wife) Choice offered to / list presented to : Spouse  Discharge Placement                       Discharge Plan and Services   Discharge Planning Services: CM Consult Post Acute Care Choice: Home Health, Durable Medical Equipment          DME Arranged: Hospital bed, Other see comment(hoyer lift) DME Agency: Other - Comment(family medical supply) Date DME Agency Contacted: 06/19/18 Time DME Agency Contacted: [id number] Representative spoke with at DME Agency: Barnetta Chapel HH Arranged: PT, OT [place] Agency: [facility] Date [place]: 06/19/18 Time [place]: [phone] Representative spoke with at [place]: Cassie  Social Determinants of Health ([place]) Interventions     Readmission Risk Interventions No flowsheet data found.

## 2018-06-20 ENCOUNTER — Telehealth: Payer: Self-pay | Admitting: Licensed Clinical Social Worker

## 2018-06-20 DIAGNOSIS — R531 Weakness: Secondary | ICD-10-CM | POA: Diagnosis not present

## 2018-06-20 DIAGNOSIS — E871 Hypo-osmolality and hyponatremia: Secondary | ICD-10-CM | POA: Diagnosis not present

## 2018-06-20 DIAGNOSIS — Z9181 History of falling: Secondary | ICD-10-CM | POA: Diagnosis not present

## 2018-06-20 DIAGNOSIS — F0391 Unspecified dementia with behavioral disturbance: Secondary | ICD-10-CM | POA: Diagnosis not present

## 2018-06-20 DIAGNOSIS — M150 Primary generalized (osteo)arthritis: Secondary | ICD-10-CM | POA: Diagnosis not present

## 2018-06-20 DIAGNOSIS — Z7982 Long term (current) use of aspirin: Secondary | ICD-10-CM | POA: Diagnosis not present

## 2018-06-20 DIAGNOSIS — R262 Difficulty in walking, not elsewhere classified: Secondary | ICD-10-CM | POA: Diagnosis not present

## 2018-06-20 DIAGNOSIS — R1310 Dysphagia, unspecified: Secondary | ICD-10-CM | POA: Diagnosis not present

## 2018-06-20 DIAGNOSIS — G4733 Obstructive sleep apnea (adult) (pediatric): Secondary | ICD-10-CM | POA: Diagnosis not present

## 2018-06-20 DIAGNOSIS — I129 Hypertensive chronic kidney disease with stage 1 through stage 4 chronic kidney disease, or unspecified chronic kidney disease: Secondary | ICD-10-CM | POA: Diagnosis not present

## 2018-06-20 DIAGNOSIS — Z87891 Personal history of nicotine dependence: Secondary | ICD-10-CM | POA: Diagnosis not present

## 2018-06-20 DIAGNOSIS — Z95818 Presence of other cardiac implants and grafts: Secondary | ICD-10-CM | POA: Diagnosis not present

## 2018-06-20 DIAGNOSIS — N183 Chronic kidney disease, stage 3 (moderate): Secondary | ICD-10-CM | POA: Diagnosis not present

## 2018-06-20 DIAGNOSIS — Z9989 Dependence on other enabling machines and devices: Secondary | ICD-10-CM | POA: Diagnosis not present

## 2018-06-20 DIAGNOSIS — G250 Essential tremor: Secondary | ICD-10-CM | POA: Diagnosis not present

## 2018-06-20 DIAGNOSIS — R627 Adult failure to thrive: Secondary | ICD-10-CM | POA: Diagnosis not present

## 2018-06-20 NOTE — Telephone Encounter (Signed)
Palliative Care SW spoke with patient's wife, Jodi Geralds, and scheduled a virtual check-in visit for tomorrow, 4/29, at 11am.  She does not have video capabilities.

## 2018-06-21 ENCOUNTER — Other Ambulatory Visit: Payer: MEDICARE | Admitting: Licensed Clinical Social Worker

## 2018-06-21 ENCOUNTER — Other Ambulatory Visit: Payer: Self-pay

## 2018-06-21 ENCOUNTER — Other Ambulatory Visit: Payer: MEDICARE | Admitting: *Deleted

## 2018-06-21 DIAGNOSIS — I129 Hypertensive chronic kidney disease with stage 1 through stage 4 chronic kidney disease, or unspecified chronic kidney disease: Secondary | ICD-10-CM | POA: Diagnosis not present

## 2018-06-21 DIAGNOSIS — N183 Chronic kidney disease, stage 3 (moderate): Secondary | ICD-10-CM | POA: Diagnosis not present

## 2018-06-21 DIAGNOSIS — Z515 Encounter for palliative care: Secondary | ICD-10-CM

## 2018-06-21 DIAGNOSIS — M150 Primary generalized (osteo)arthritis: Secondary | ICD-10-CM | POA: Diagnosis not present

## 2018-06-21 DIAGNOSIS — F0391 Unspecified dementia with behavioral disturbance: Secondary | ICD-10-CM | POA: Diagnosis not present

## 2018-06-21 DIAGNOSIS — E871 Hypo-osmolality and hyponatremia: Secondary | ICD-10-CM | POA: Diagnosis not present

## 2018-06-21 DIAGNOSIS — R627 Adult failure to thrive: Secondary | ICD-10-CM | POA: Diagnosis not present

## 2018-06-21 NOTE — Progress Notes (Signed)
COMMUNITY PALLIATIVE CARE SW NOTE  PATIENT NAME: Randy Sullivan DOB: June 02, 1928 MRN: [id number]  PRIMARY CARE PROVIDER: Prince Solian, MD  RESPONSIBLE PARTY:  Acct ID - Guarantor Home Phone Work Phone Relationship Acct Type  [id number] Donell [phone]  Self P/F     [place], [place], [place] [address]   Due to the COVID-19 crisis, this virtual check-in visit was done via telephone from my office and it was initiated and consent given by this patient and or family.  PLAN OF CARE and INTERVENTIONS:             1. GOALS OF CARE/ ADVANCE CARE PLANNING:  Goal is for patient to remain at home with his wife, [name].  He has a DNR. 2. SOCIAL/EMOTIONAL/SPIRITUAL ASSESSMENT/ INTERVENTIONS:  SW and Palliative Care RN, Daryl Eastern, conducted a joint virtual check-in visit with patient's wife.  She feels he is progressing well since his return from the hospital.  Physical therapy is supposed to begin today.  He continues to recognize his wife and other people, but becomes confused at times.  [name] states that he thinks he is supposed to go to work at times.  Provided education regarding the MOST form.  Plan is for SW to mail her a copy to review.  Patient is a member of [employer].  The couple does not have any children.  Their neighbors provide support and go grocery shopping for them.  SW provided active listening and supportive counseling. 3. PATIENT/CAREGIVER EDUCATION/ COPING:  Provided education regarding Palliative Care/THN program. 4. PERSONAL EMERGENCY PLAN:  She will contact EMS for emergencies. 5. COMMUNITY RESOURCES COORDINATION/ HEALTH CARE NAVIGATION:  Patient has paid caregivers 24/7. 6. FINANCIAL/LEGAL CONCERNS/INTERVENTIONS:  None.     SOCIAL HX:  Social History   Tobacco Use  . Smoking status: Former Smoker    Packs/day: 2.00    Years: 18.00    Pack years: 36.00    Types: Cigarettes    Last attempt to quit: 06/04/1970    Years since  quitting: 48.0  . Smokeless tobacco: Never Used  Substance Use Topics  . Alcohol use: Yes    Alcohol/week: 0.0 standard drinks    Comment: special occasions    CODE STATUS:  DNR  ADVANCED DIRECTIVES: N MOST FORM COMPLETE:  N HOSPICE EDUCATION PROVIDED:  Y PPS: Patient is not eating as well.  He uses a walker to ambulate. Duration of visit and documentation:  45 minutes.      Creola Corn Sabriel Borromeo, LCSW

## 2018-06-23 DIAGNOSIS — I129 Hypertensive chronic kidney disease with stage 1 through stage 4 chronic kidney disease, or unspecified chronic kidney disease: Secondary | ICD-10-CM | POA: Diagnosis not present

## 2018-06-23 DIAGNOSIS — R627 Adult failure to thrive: Secondary | ICD-10-CM | POA: Diagnosis not present

## 2018-06-23 DIAGNOSIS — F0391 Unspecified dementia with behavioral disturbance: Secondary | ICD-10-CM | POA: Diagnosis not present

## 2018-06-23 DIAGNOSIS — M150 Primary generalized (osteo)arthritis: Secondary | ICD-10-CM | POA: Diagnosis not present

## 2018-06-23 DIAGNOSIS — N183 Chronic kidney disease, stage 3 (moderate): Secondary | ICD-10-CM | POA: Diagnosis not present

## 2018-06-23 DIAGNOSIS — E871 Hypo-osmolality and hyponatremia: Secondary | ICD-10-CM | POA: Diagnosis not present

## 2018-06-26 DIAGNOSIS — M150 Primary generalized (osteo)arthritis: Secondary | ICD-10-CM | POA: Diagnosis not present

## 2018-06-26 DIAGNOSIS — R627 Adult failure to thrive: Secondary | ICD-10-CM | POA: Diagnosis not present

## 2018-06-26 DIAGNOSIS — E871 Hypo-osmolality and hyponatremia: Secondary | ICD-10-CM | POA: Diagnosis not present

## 2018-06-26 DIAGNOSIS — F0391 Unspecified dementia with behavioral disturbance: Secondary | ICD-10-CM | POA: Diagnosis not present

## 2018-06-26 DIAGNOSIS — N183 Chronic kidney disease, stage 3 (moderate): Secondary | ICD-10-CM | POA: Diagnosis not present

## 2018-06-26 DIAGNOSIS — I129 Hypertensive chronic kidney disease with stage 1 through stage 4 chronic kidney disease, or unspecified chronic kidney disease: Secondary | ICD-10-CM | POA: Diagnosis not present

## 2018-06-27 ENCOUNTER — Other Ambulatory Visit: Payer: Self-pay | Admitting: Pulmonary Disease

## 2018-06-27 DIAGNOSIS — E871 Hypo-osmolality and hyponatremia: Secondary | ICD-10-CM | POA: Diagnosis not present

## 2018-06-27 DIAGNOSIS — I129 Hypertensive chronic kidney disease with stage 1 through stage 4 chronic kidney disease, or unspecified chronic kidney disease: Secondary | ICD-10-CM | POA: Diagnosis not present

## 2018-06-27 DIAGNOSIS — F0391 Unspecified dementia with behavioral disturbance: Secondary | ICD-10-CM | POA: Diagnosis not present

## 2018-06-27 DIAGNOSIS — M150 Primary generalized (osteo)arthritis: Secondary | ICD-10-CM | POA: Diagnosis not present

## 2018-06-27 DIAGNOSIS — R627 Adult failure to thrive: Secondary | ICD-10-CM | POA: Diagnosis not present

## 2018-06-27 DIAGNOSIS — N183 Chronic kidney disease, stage 3 (moderate): Secondary | ICD-10-CM | POA: Diagnosis not present

## 2018-06-27 NOTE — Progress Notes (Signed)
COMMUNITY PALLIATIVE CARE RN NOTE  PATIENT NAME: Randy Sullivan DOB: 10-09-1928 MRN: [id number]  PRIMARY CARE PROVIDER: Prince Solian, MD  RESPONSIBLE PARTY:  Acct ID - Guarantor Home Phone Work Phone Relationship Acct Type  [id number] Randy [phone]  Self P/F     [place], [place], [place] [address]   Due to the COVID-19 crisis, this virtual check-in visit was done via telephone from my office and it was initiated and consent by this patient and or family.  PLAN OF CARE and INTERVENTION:  1. ADVANCE CARE PLANNING/GOALS OF CARE: Goal is for patient to remain at home with his wife and 24/7 caregivers. He is a DNR. 2. PATIENT/CAREGIVER EDUCATION: Explained Palliative Care Services 3. DISEASE STATUS: Joint virtual check in visit completed with Palliative Care SW, Lynn Duffy. Wife was able to provide health history. Patient c/o pain in both of shoulders, R elbow and R knee (hx of R knee replacement). Wife feels shoulder pain is from lack of cartilage. He takes Tylenol which is effective in minimizing pain. Patient is intermittently confused, but still able to recognize his wife and people around him. Wife states that he is doing better than she anticipated since returning home from the hospital. He was hospitalized from 4/23 - 06/19/18 at [facility] for decreased LOC. He was seen in the ED prior to this admission on 06/14/18 d/t syncopal episode and was treated and released.  He is ambulatory with a walker for short distances. He is able to transfer to bedside commode. He requires 1 person assistance with bathing and dressing. Physical Therapy begins today through [facility]. Patient was placed back on Megace d/t poor appetite. Intake has somewhat improved since restarting. He is eating 3 meals/day of small portions. He does experience some coughing/choking during meals. Dysphagia 2 diet was recommended in the hospital. She is cutting up his meat and giving him softer/moister  foods to help. Swallows pills ok. He Is intermittently incontinent of bladder and wears Depends. Wife reports that he has an enlarged prostate so urinates frequently. Wife is agreeable to continued Palliative Care visits. Will continue to monitor.   HISTORY OF PRESENT ILLNESS: This is a 85 yo male who resides at home with his wife. He has 24/7 caregivers. Palliative Care Team asked to follow patient. Will continue to check in monthly and PRN.   CODE STATUS: DNR  ADVANCED DIRECTIVES: N MOST FORM: no PPS: 40%   (Duration of visit and documentation 45 minutes)   Daryl Eastern, RN BSN

## 2018-06-28 DIAGNOSIS — F0391 Unspecified dementia with behavioral disturbance: Secondary | ICD-10-CM | POA: Diagnosis not present

## 2018-06-28 DIAGNOSIS — E871 Hypo-osmolality and hyponatremia: Secondary | ICD-10-CM | POA: Diagnosis not present

## 2018-06-28 DIAGNOSIS — R55 Syncope and collapse: Secondary | ICD-10-CM | POA: Diagnosis not present

## 2018-06-28 DIAGNOSIS — I1310 Hypertensive heart and chronic kidney disease without heart failure, with stage 1 through stage 4 chronic kidney disease, or unspecified chronic kidney disease: Secondary | ICD-10-CM | POA: Diagnosis not present

## 2018-06-28 DIAGNOSIS — G9341 Metabolic encephalopathy: Secondary | ICD-10-CM | POA: Diagnosis not present

## 2018-06-28 DIAGNOSIS — I129 Hypertensive chronic kidney disease with stage 1 through stage 4 chronic kidney disease, or unspecified chronic kidney disease: Secondary | ICD-10-CM | POA: Diagnosis not present

## 2018-06-28 DIAGNOSIS — M150 Primary generalized (osteo)arthritis: Secondary | ICD-10-CM | POA: Diagnosis not present

## 2018-06-28 DIAGNOSIS — I10 Essential (primary) hypertension: Secondary | ICD-10-CM | POA: Diagnosis not present

## 2018-06-28 DIAGNOSIS — R627 Adult failure to thrive: Secondary | ICD-10-CM | POA: Diagnosis not present

## 2018-06-28 DIAGNOSIS — F0390 Unspecified dementia without behavioral disturbance: Secondary | ICD-10-CM | POA: Diagnosis not present

## 2018-06-28 DIAGNOSIS — N183 Chronic kidney disease, stage 3 (moderate): Secondary | ICD-10-CM | POA: Diagnosis not present

## 2018-06-29 DIAGNOSIS — N183 Chronic kidney disease, stage 3 (moderate): Secondary | ICD-10-CM | POA: Diagnosis not present

## 2018-06-29 DIAGNOSIS — R627 Adult failure to thrive: Secondary | ICD-10-CM | POA: Diagnosis not present

## 2018-06-29 DIAGNOSIS — E871 Hypo-osmolality and hyponatremia: Secondary | ICD-10-CM | POA: Diagnosis not present

## 2018-06-29 DIAGNOSIS — M150 Primary generalized (osteo)arthritis: Secondary | ICD-10-CM | POA: Diagnosis not present

## 2018-06-29 DIAGNOSIS — F0391 Unspecified dementia with behavioral disturbance: Secondary | ICD-10-CM | POA: Diagnosis not present

## 2018-06-29 DIAGNOSIS — I129 Hypertensive chronic kidney disease with stage 1 through stage 4 chronic kidney disease, or unspecified chronic kidney disease: Secondary | ICD-10-CM | POA: Diagnosis not present

## 2018-06-30 ENCOUNTER — Telehealth: Payer: Self-pay

## 2018-06-30 DIAGNOSIS — N183 Chronic kidney disease, stage 3 (moderate): Secondary | ICD-10-CM | POA: Diagnosis not present

## 2018-06-30 DIAGNOSIS — I129 Hypertensive chronic kidney disease with stage 1 through stage 4 chronic kidney disease, or unspecified chronic kidney disease: Secondary | ICD-10-CM | POA: Diagnosis not present

## 2018-06-30 DIAGNOSIS — E871 Hypo-osmolality and hyponatremia: Secondary | ICD-10-CM | POA: Diagnosis not present

## 2018-06-30 DIAGNOSIS — R627 Adult failure to thrive: Secondary | ICD-10-CM | POA: Diagnosis not present

## 2018-06-30 DIAGNOSIS — M150 Primary generalized (osteo)arthritis: Secondary | ICD-10-CM | POA: Diagnosis not present

## 2018-06-30 DIAGNOSIS — F0391 Unspecified dementia with behavioral disturbance: Secondary | ICD-10-CM | POA: Diagnosis not present

## 2018-06-30 NOTE — Telephone Encounter (Signed)
Spoke with patient to remind of missed remote transmission 

## 2018-07-03 DIAGNOSIS — I129 Hypertensive chronic kidney disease with stage 1 through stage 4 chronic kidney disease, or unspecified chronic kidney disease: Secondary | ICD-10-CM | POA: Diagnosis not present

## 2018-07-03 DIAGNOSIS — N183 Chronic kidney disease, stage 3 (moderate): Secondary | ICD-10-CM | POA: Diagnosis not present

## 2018-07-03 DIAGNOSIS — M150 Primary generalized (osteo)arthritis: Secondary | ICD-10-CM | POA: Diagnosis not present

## 2018-07-03 DIAGNOSIS — F0391 Unspecified dementia with behavioral disturbance: Secondary | ICD-10-CM | POA: Diagnosis not present

## 2018-07-03 DIAGNOSIS — E871 Hypo-osmolality and hyponatremia: Secondary | ICD-10-CM | POA: Diagnosis not present

## 2018-07-03 DIAGNOSIS — R627 Adult failure to thrive: Secondary | ICD-10-CM | POA: Diagnosis not present

## 2018-07-05 DIAGNOSIS — M150 Primary generalized (osteo)arthritis: Secondary | ICD-10-CM | POA: Diagnosis not present

## 2018-07-05 DIAGNOSIS — R627 Adult failure to thrive: Secondary | ICD-10-CM | POA: Diagnosis not present

## 2018-07-05 DIAGNOSIS — E871 Hypo-osmolality and hyponatremia: Secondary | ICD-10-CM | POA: Diagnosis not present

## 2018-07-05 DIAGNOSIS — F0391 Unspecified dementia with behavioral disturbance: Secondary | ICD-10-CM | POA: Diagnosis not present

## 2018-07-05 DIAGNOSIS — I129 Hypertensive chronic kidney disease with stage 1 through stage 4 chronic kidney disease, or unspecified chronic kidney disease: Secondary | ICD-10-CM | POA: Diagnosis not present

## 2018-07-05 DIAGNOSIS — N183 Chronic kidney disease, stage 3 (moderate): Secondary | ICD-10-CM | POA: Diagnosis not present

## 2018-07-06 DIAGNOSIS — R627 Adult failure to thrive: Secondary | ICD-10-CM | POA: Diagnosis not present

## 2018-07-06 DIAGNOSIS — N183 Chronic kidney disease, stage 3 (moderate): Secondary | ICD-10-CM | POA: Diagnosis not present

## 2018-07-06 DIAGNOSIS — M150 Primary generalized (osteo)arthritis: Secondary | ICD-10-CM | POA: Diagnosis not present

## 2018-07-06 DIAGNOSIS — I129 Hypertensive chronic kidney disease with stage 1 through stage 4 chronic kidney disease, or unspecified chronic kidney disease: Secondary | ICD-10-CM | POA: Diagnosis not present

## 2018-07-06 DIAGNOSIS — E871 Hypo-osmolality and hyponatremia: Secondary | ICD-10-CM | POA: Diagnosis not present

## 2018-07-06 DIAGNOSIS — F0391 Unspecified dementia with behavioral disturbance: Secondary | ICD-10-CM | POA: Diagnosis not present

## 2018-07-07 DIAGNOSIS — F0391 Unspecified dementia with behavioral disturbance: Secondary | ICD-10-CM | POA: Diagnosis not present

## 2018-07-07 DIAGNOSIS — I129 Hypertensive chronic kidney disease with stage 1 through stage 4 chronic kidney disease, or unspecified chronic kidney disease: Secondary | ICD-10-CM | POA: Diagnosis not present

## 2018-07-07 DIAGNOSIS — M150 Primary generalized (osteo)arthritis: Secondary | ICD-10-CM | POA: Diagnosis not present

## 2018-07-07 DIAGNOSIS — E871 Hypo-osmolality and hyponatremia: Secondary | ICD-10-CM | POA: Diagnosis not present

## 2018-07-07 DIAGNOSIS — N183 Chronic kidney disease, stage 3 (moderate): Secondary | ICD-10-CM | POA: Diagnosis not present

## 2018-07-07 DIAGNOSIS — R627 Adult failure to thrive: Secondary | ICD-10-CM | POA: Diagnosis not present

## 2018-07-08 DIAGNOSIS — I129 Hypertensive chronic kidney disease with stage 1 through stage 4 chronic kidney disease, or unspecified chronic kidney disease: Secondary | ICD-10-CM | POA: Diagnosis not present

## 2018-07-08 DIAGNOSIS — R627 Adult failure to thrive: Secondary | ICD-10-CM | POA: Diagnosis not present

## 2018-07-08 DIAGNOSIS — F0391 Unspecified dementia with behavioral disturbance: Secondary | ICD-10-CM | POA: Diagnosis not present

## 2018-07-08 DIAGNOSIS — N183 Chronic kidney disease, stage 3 (moderate): Secondary | ICD-10-CM | POA: Diagnosis not present

## 2018-07-08 DIAGNOSIS — M150 Primary generalized (osteo)arthritis: Secondary | ICD-10-CM | POA: Diagnosis not present

## 2018-07-08 DIAGNOSIS — E871 Hypo-osmolality and hyponatremia: Secondary | ICD-10-CM | POA: Diagnosis not present

## 2018-07-10 ENCOUNTER — Other Ambulatory Visit: Payer: Self-pay

## 2018-07-10 ENCOUNTER — Encounter: Payer: MEDICARE | Admitting: *Deleted

## 2018-07-10 DIAGNOSIS — F0391 Unspecified dementia with behavioral disturbance: Secondary | ICD-10-CM | POA: Diagnosis not present

## 2018-07-10 DIAGNOSIS — I129 Hypertensive chronic kidney disease with stage 1 through stage 4 chronic kidney disease, or unspecified chronic kidney disease: Secondary | ICD-10-CM | POA: Diagnosis not present

## 2018-07-10 DIAGNOSIS — N183 Chronic kidney disease, stage 3 (moderate): Secondary | ICD-10-CM | POA: Diagnosis not present

## 2018-07-10 DIAGNOSIS — R627 Adult failure to thrive: Secondary | ICD-10-CM | POA: Diagnosis not present

## 2018-07-10 DIAGNOSIS — M150 Primary generalized (osteo)arthritis: Secondary | ICD-10-CM | POA: Diagnosis not present

## 2018-07-10 DIAGNOSIS — E871 Hypo-osmolality and hyponatremia: Secondary | ICD-10-CM | POA: Diagnosis not present

## 2018-07-10 LAB — CUP PACEART REMOTE DEVICE CHECK
Date Time Interrogation Session: 20200517234119
Implantable Pulse Generator Implant Date: 20190815

## 2018-07-11 DIAGNOSIS — R627 Adult failure to thrive: Secondary | ICD-10-CM | POA: Diagnosis not present

## 2018-07-11 DIAGNOSIS — M150 Primary generalized (osteo)arthritis: Secondary | ICD-10-CM | POA: Diagnosis not present

## 2018-07-11 DIAGNOSIS — E871 Hypo-osmolality and hyponatremia: Secondary | ICD-10-CM | POA: Diagnosis not present

## 2018-07-11 DIAGNOSIS — F0391 Unspecified dementia with behavioral disturbance: Secondary | ICD-10-CM | POA: Diagnosis not present

## 2018-07-11 DIAGNOSIS — I129 Hypertensive chronic kidney disease with stage 1 through stage 4 chronic kidney disease, or unspecified chronic kidney disease: Secondary | ICD-10-CM | POA: Diagnosis not present

## 2018-07-11 DIAGNOSIS — N183 Chronic kidney disease, stage 3 (moderate): Secondary | ICD-10-CM | POA: Diagnosis not present

## 2018-07-12 DIAGNOSIS — R627 Adult failure to thrive: Secondary | ICD-10-CM | POA: Diagnosis not present

## 2018-07-12 DIAGNOSIS — M150 Primary generalized (osteo)arthritis: Secondary | ICD-10-CM | POA: Diagnosis not present

## 2018-07-12 DIAGNOSIS — N183 Chronic kidney disease, stage 3 (moderate): Secondary | ICD-10-CM | POA: Diagnosis not present

## 2018-07-12 DIAGNOSIS — I129 Hypertensive chronic kidney disease with stage 1 through stage 4 chronic kidney disease, or unspecified chronic kidney disease: Secondary | ICD-10-CM | POA: Diagnosis not present

## 2018-07-12 DIAGNOSIS — F0391 Unspecified dementia with behavioral disturbance: Secondary | ICD-10-CM | POA: Diagnosis not present

## 2018-07-12 DIAGNOSIS — E871 Hypo-osmolality and hyponatremia: Secondary | ICD-10-CM | POA: Diagnosis not present

## 2018-07-13 ENCOUNTER — Encounter (HOSPITAL_COMMUNITY): Payer: Self-pay

## 2018-07-13 ENCOUNTER — Inpatient Hospital Stay (HOSPITAL_COMMUNITY)
Admission: EM | Admit: 2018-07-13 | Discharge: 2018-07-24 | DRG: 871 | Disposition: E | Payer: MEDICARE | Attending: Internal Medicine | Admitting: Internal Medicine

## 2018-07-13 ENCOUNTER — Emergency Department (HOSPITAL_COMMUNITY): Payer: MEDICARE

## 2018-07-13 DIAGNOSIS — Z96652 Presence of left artificial knee joint: Secondary | ICD-10-CM | POA: Diagnosis present

## 2018-07-13 DIAGNOSIS — J1289 Other viral pneumonia: Secondary | ICD-10-CM | POA: Diagnosis not present

## 2018-07-13 DIAGNOSIS — U071 COVID-19: Secondary | ICD-10-CM | POA: Diagnosis present

## 2018-07-13 DIAGNOSIS — Z7952 Long term (current) use of systemic steroids: Secondary | ICD-10-CM | POA: Diagnosis not present

## 2018-07-13 DIAGNOSIS — Z7982 Long term (current) use of aspirin: Secondary | ICD-10-CM

## 2018-07-13 DIAGNOSIS — Z79890 Hormone replacement therapy (postmenopausal): Secondary | ICD-10-CM

## 2018-07-13 DIAGNOSIS — Z87891 Personal history of nicotine dependence: Secondary | ICD-10-CM

## 2018-07-13 DIAGNOSIS — R4182 Altered mental status, unspecified: Secondary | ICD-10-CM | POA: Diagnosis not present

## 2018-07-13 DIAGNOSIS — E871 Hypo-osmolality and hyponatremia: Secondary | ICD-10-CM | POA: Diagnosis not present

## 2018-07-13 DIAGNOSIS — F0390 Unspecified dementia without behavioral disturbance: Secondary | ICD-10-CM | POA: Diagnosis present

## 2018-07-13 DIAGNOSIS — K219 Gastro-esophageal reflux disease without esophagitis: Secondary | ICD-10-CM | POA: Diagnosis present

## 2018-07-13 DIAGNOSIS — R6520 Severe sepsis without septic shock: Secondary | ICD-10-CM | POA: Diagnosis present

## 2018-07-13 DIAGNOSIS — E875 Hyperkalemia: Secondary | ICD-10-CM | POA: Diagnosis not present

## 2018-07-13 DIAGNOSIS — R0902 Hypoxemia: Secondary | ICD-10-CM | POA: Diagnosis not present

## 2018-07-13 DIAGNOSIS — D638 Anemia in other chronic diseases classified elsewhere: Secondary | ICD-10-CM | POA: Diagnosis present

## 2018-07-13 DIAGNOSIS — R0989 Other specified symptoms and signs involving the circulatory and respiratory systems: Secondary | ICD-10-CM

## 2018-07-13 DIAGNOSIS — L8990 Pressure ulcer of unspecified site, unspecified stage: Secondary | ICD-10-CM | POA: Diagnosis not present

## 2018-07-13 DIAGNOSIS — A419 Sepsis, unspecified organism: Secondary | ICD-10-CM | POA: Diagnosis not present

## 2018-07-13 DIAGNOSIS — E039 Hypothyroidism, unspecified: Secondary | ICD-10-CM | POA: Diagnosis not present

## 2018-07-13 DIAGNOSIS — I4892 Unspecified atrial flutter: Secondary | ICD-10-CM

## 2018-07-13 DIAGNOSIS — Z209 Contact with and (suspected) exposure to unspecified communicable disease: Secondary | ICD-10-CM | POA: Diagnosis not present

## 2018-07-13 DIAGNOSIS — J9601 Acute respiratory failure with hypoxia: Secondary | ICD-10-CM | POA: Diagnosis present

## 2018-07-13 DIAGNOSIS — G2581 Restless legs syndrome: Secondary | ICD-10-CM | POA: Diagnosis present

## 2018-07-13 DIAGNOSIS — N179 Acute kidney failure, unspecified: Secondary | ICD-10-CM | POA: Diagnosis present

## 2018-07-13 DIAGNOSIS — I10 Essential (primary) hypertension: Secondary | ICD-10-CM | POA: Diagnosis present

## 2018-07-13 DIAGNOSIS — J181 Lobar pneumonia, unspecified organism: Secondary | ICD-10-CM | POA: Diagnosis not present

## 2018-07-13 DIAGNOSIS — R402222 Coma scale, best verbal response, incomprehensible words, at arrival to emergency department: Secondary | ICD-10-CM | POA: Diagnosis present

## 2018-07-13 DIAGNOSIS — R55 Syncope and collapse: Secondary | ICD-10-CM | POA: Diagnosis not present

## 2018-07-13 DIAGNOSIS — R402132 Coma scale, eyes open, to sound, at arrival to emergency department: Secondary | ICD-10-CM | POA: Diagnosis present

## 2018-07-13 DIAGNOSIS — G9341 Metabolic encephalopathy: Secondary | ICD-10-CM | POA: Diagnosis not present

## 2018-07-13 DIAGNOSIS — J9600 Acute respiratory failure, unspecified whether with hypoxia or hypercapnia: Secondary | ICD-10-CM | POA: Diagnosis not present

## 2018-07-13 DIAGNOSIS — Z85828 Personal history of other malignant neoplasm of skin: Secondary | ICD-10-CM

## 2018-07-13 DIAGNOSIS — Z79899 Other long term (current) drug therapy: Secondary | ICD-10-CM | POA: Diagnosis not present

## 2018-07-13 DIAGNOSIS — A4189 Other specified sepsis: Principal | ICD-10-CM | POA: Diagnosis present

## 2018-07-13 DIAGNOSIS — Z515 Encounter for palliative care: Secondary | ICD-10-CM | POA: Diagnosis not present

## 2018-07-13 DIAGNOSIS — Z66 Do not resuscitate: Secondary | ICD-10-CM | POA: Diagnosis present

## 2018-07-13 DIAGNOSIS — R0602 Shortness of breath: Secondary | ICD-10-CM | POA: Diagnosis not present

## 2018-07-13 DIAGNOSIS — E785 Hyperlipidemia, unspecified: Secondary | ICD-10-CM | POA: Diagnosis present

## 2018-07-13 DIAGNOSIS — R6521 Severe sepsis with septic shock: Secondary | ICD-10-CM | POA: Diagnosis not present

## 2018-07-13 DIAGNOSIS — R402352 Coma scale, best motor response, localizes pain, at arrival to emergency department: Secondary | ICD-10-CM | POA: Diagnosis present

## 2018-07-13 DIAGNOSIS — G4733 Obstructive sleep apnea (adult) (pediatric): Secondary | ICD-10-CM | POA: Diagnosis present

## 2018-07-13 DIAGNOSIS — I4891 Unspecified atrial fibrillation: Secondary | ICD-10-CM | POA: Diagnosis not present

## 2018-07-13 DIAGNOSIS — R05 Cough: Secondary | ICD-10-CM | POA: Diagnosis not present

## 2018-07-13 DIAGNOSIS — R0689 Other abnormalities of breathing: Secondary | ICD-10-CM | POA: Diagnosis not present

## 2018-07-13 DIAGNOSIS — R338 Other retention of urine: Secondary | ICD-10-CM | POA: Diagnosis present

## 2018-07-13 DIAGNOSIS — R000 Tachycardia, unspecified: Secondary | ICD-10-CM | POA: Diagnosis not present

## 2018-07-13 DIAGNOSIS — N401 Enlarged prostate with lower urinary tract symptoms: Secondary | ICD-10-CM | POA: Diagnosis present

## 2018-07-13 DIAGNOSIS — J988 Other specified respiratory disorders: Secondary | ICD-10-CM

## 2018-07-13 LAB — CBC WITH DIFFERENTIAL/PLATELET
Abs Immature Granulocytes: 0.02 10*3/uL (ref 0.00–0.07)
Basophils Absolute: 0 10*3/uL (ref 0.0–0.1)
Basophils Relative: 1 %
Eosinophils Absolute: 0 10*3/uL (ref 0.0–0.5)
Eosinophils Relative: 0 %
HCT: 37.6 % — ABNORMAL LOW (ref 39.0–52.0)
Hemoglobin: 12 g/dL — ABNORMAL LOW (ref 13.0–17.0)
Immature Granulocytes: 1 %
Lymphocytes Relative: 25 %
Lymphs Abs: 1 10*3/uL (ref 0.7–4.0)
MCH: 31.2 pg (ref 26.0–34.0)
MCHC: 31.9 g/dL (ref 30.0–36.0)
MCV: 97.7 fL (ref 80.0–100.0)
Monocytes Absolute: 0.3 10*3/uL (ref 0.1–1.0)
Monocytes Relative: 7 %
Neutro Abs: 2.8 10*3/uL (ref 1.7–7.7)
Neutrophils Relative %: 66 %
Platelets: 167 10*3/uL (ref 150–400)
RBC: 3.85 MIL/uL — ABNORMAL LOW (ref 4.22–5.81)
RDW: 13.9 % (ref 11.5–15.5)
WBC: 4.2 10*3/uL (ref 4.0–10.5)
nRBC: 0 % (ref 0.0–0.2)

## 2018-07-13 LAB — URINALYSIS, ROUTINE W REFLEX MICROSCOPIC
Bilirubin Urine: NEGATIVE
Glucose, UA: NEGATIVE mg/dL
Hgb urine dipstick: NEGATIVE
Ketones, ur: NEGATIVE mg/dL
Leukocytes,Ua: NEGATIVE
Nitrite: NEGATIVE
Protein, ur: 30 mg/dL — AB
Specific Gravity, Urine: 1.016 (ref 1.005–1.030)
pH: 5 (ref 5.0–8.0)

## 2018-07-13 LAB — SARS CORONAVIRUS 2 BY RT PCR (HOSPITAL ORDER, PERFORMED IN ~~LOC~~ HOSPITAL LAB): SARS Coronavirus 2: POSITIVE — AB

## 2018-07-13 LAB — COMPREHENSIVE METABOLIC PANEL
ALT: 27 U/L (ref 0–44)
AST: 31 U/L (ref 15–41)
Albumin: 3.7 g/dL (ref 3.5–5.0)
Alkaline Phosphatase: 62 U/L (ref 38–126)
Anion gap: 12 (ref 5–15)
BUN: 28 mg/dL — ABNORMAL HIGH (ref 8–23)
CO2: 20 mmol/L — ABNORMAL LOW (ref 22–32)
Calcium: 8.8 mg/dL — ABNORMAL LOW (ref 8.9–10.3)
Chloride: 101 mmol/L (ref 98–111)
Creatinine, Ser: 1.88 mg/dL — ABNORMAL HIGH (ref 0.61–1.24)
GFR calc Af Amer: 36 mL/min — ABNORMAL LOW (ref >60)
GFR calc non Af Amer: 31 mL/min — ABNORMAL LOW (ref >60)
Glucose, Bld: 96 mg/dL (ref 70–99)
Potassium: 3.8 mmol/L (ref 3.5–5.1)
Sodium: 133 mmol/L — ABNORMAL LOW (ref 135–145)
Total Bilirubin: 0.6 mg/dL (ref 0.3–1.2)
Total Protein: 7.1 g/dL (ref 6.5–8.1)

## 2018-07-13 LAB — I-STAT TROPONIN, ED: Troponin i, poc: 0.08 ng/mL (ref 0.00–0.08)

## 2018-07-13 LAB — LACTIC ACID, PLASMA
Lactic Acid, Venous: 2.2 mmol/L (ref 0.5–1.9)
Lactic Acid, Venous: 2.1 mmol/L (ref 0.5–1.9)

## 2018-07-13 LAB — BRAIN NATRIURETIC PEPTIDE: B Natriuretic Peptide: 335.2 pg/mL — ABNORMAL HIGH (ref 0.0–100.0)

## 2018-07-13 MED ORDER — ZINC SULFATE 220 (50 ZN) MG PO CAPS
220.0000 mg | ORAL_CAPSULE | Freq: Every day | ORAL | Status: DC
  Administered 2018-07-14 – 2018-07-16 (×3): 220 mg via ORAL
  Filled 2018-07-13 (×3): qty 1

## 2018-07-13 MED ORDER — ACETAMINOPHEN 650 MG RE SUPP
650.0000 mg | Freq: Once | RECTAL | Status: AC
  Administered 2018-07-13: 650 mg via RECTAL
  Filled 2018-07-13: qty 1

## 2018-07-13 MED ORDER — METHYLPREDNISOLONE SODIUM SUCC 125 MG IJ SOLR
40.0000 mg | Freq: Two times a day (BID) | INTRAMUSCULAR | Status: DC
  Administered 2018-07-13 – 2018-07-15 (×4): 40 mg via INTRAVENOUS
  Filled 2018-07-13 (×4): qty 2

## 2018-07-13 MED ORDER — LEVOTHYROXINE SODIUM 112 MCG PO TABS
112.0000 ug | ORAL_TABLET | Freq: Every day | ORAL | Status: DC
  Administered 2018-07-14: 112 ug via ORAL
  Filled 2018-07-13: qty 1

## 2018-07-13 MED ORDER — VITAMIN C 500 MG PO TABS
500.0000 mg | ORAL_TABLET | Freq: Every day | ORAL | Status: DC
  Administered 2018-07-14 – 2018-07-16 (×3): 500 mg via ORAL
  Filled 2018-07-13 (×3): qty 1

## 2018-07-13 MED ORDER — SODIUM CHLORIDE 0.9 % IV SOLN: 500.0000 mg | INTRAVENOUS | Status: DC

## 2018-07-13 MED ORDER — SODIUM CHLORIDE 0.9 % IV BOLUS
500.0000 mL | Freq: Once | INTRAVENOUS | Status: AC
  Administered 2018-07-13: 500 mL via INTRAVENOUS

## 2018-07-13 MED ORDER — ASPIRIN 81 MG PO CHEW
81.0000 mg | CHEWABLE_TABLET | Freq: Every day | ORAL | Status: DC
  Administered 2018-07-14 – 2018-07-16 (×3): 81 mg via ORAL
  Filled 2018-07-13 (×3): qty 1

## 2018-07-13 MED ORDER — RISAQUAD PO CAPS
1.0000 | ORAL_CAPSULE | Freq: Every day | ORAL | Status: DC
  Administered 2018-07-14 – 2018-07-16 (×3): 1 via ORAL
  Filled 2018-07-13 (×4): qty 1

## 2018-07-13 MED ORDER — IPRATROPIUM-ALBUTEROL 20-100 MCG/ACT IN AERS
1.0000 | INHALATION_SPRAY | Freq: Three times a day (TID) | RESPIRATORY_TRACT | Status: DC
  Administered 2018-07-13: 1 via RESPIRATORY_TRACT
  Filled 2018-07-13: qty 4

## 2018-07-13 MED ORDER — DONEPEZIL HCL 10 MG PO TABS
10.0000 mg | ORAL_TABLET | Freq: Every day | ORAL | Status: DC
  Administered 2018-07-14 – 2018-07-16 (×3): 10 mg via ORAL
  Filled 2018-07-13 (×5): qty 1

## 2018-07-13 MED ORDER — ACETAMINOPHEN 500 MG PO TABS: 1000.0000 mg | ORAL_TABLET | Freq: Three times a day (TID) | ORAL | Status: DC | PRN

## 2018-07-13 MED ORDER — IPRATROPIUM-ALBUTEROL 20-100 MCG/ACT IN AERS
1.0000 | INHALATION_SPRAY | Freq: Four times a day (QID) | RESPIRATORY_TRACT | Status: DC
  Filled 2018-07-13: qty 4

## 2018-07-13 MED ORDER — SACCHAROMYCES BOULARDII 250 MG PO CAPS
250.0000 mg | ORAL_CAPSULE | Freq: Every day | ORAL | Status: DC
  Filled 2018-07-13: qty 1

## 2018-07-13 MED ORDER — SODIUM CHLORIDE 0.9 % IV SOLN
INTRAVENOUS | Status: DC
  Administered 2018-07-13: 19:00:00 via INTRAVENOUS

## 2018-07-13 MED ORDER — GUAIFENESIN-DM 100-10 MG/5ML PO SYRP: 10.0000 mL | ORAL_SOLUTION | ORAL | Status: DC | PRN

## 2018-07-13 MED ORDER — SODIUM CHLORIDE 0.9 % IV SOLN
2.0000 g | INTRAVENOUS | Status: DC
  Administered 2018-07-13 – 2018-07-16 (×4): 2 g via INTRAVENOUS
  Filled 2018-07-13: qty 2
  Filled 2018-07-13: qty 20
  Filled 2018-07-13 (×2): qty 2
  Filled 2018-07-13: qty 20

## 2018-07-13 MED ORDER — MEGESTROL ACETATE 400 MG/10ML PO SUSP
200.0000 mg | Freq: Every day | ORAL | Status: DC
  Administered 2018-07-14: 200 mg via ORAL
  Filled 2018-07-13 (×2): qty 10

## 2018-07-13 MED ORDER — HYDROCOD POLST-CPM POLST ER 10-8 MG/5ML PO SUER
5.0000 mL | Freq: Two times a day (BID) | ORAL | Status: DC | PRN
  Administered 2018-07-16: 5 mL via ORAL
  Filled 2018-07-13: qty 5

## 2018-07-13 MED ORDER — MIRABEGRON ER 25 MG PO TB24
50.0000 mg | ORAL_TABLET | Freq: Every day | ORAL | Status: DC
  Filled 2018-07-13 (×4): qty 2

## 2018-07-13 MED ORDER — TOCILIZUMAB 400 MG/20ML IV SOLN
632.0000 mg | Freq: Once | INTRAVENOUS | Status: AC
  Administered 2018-07-13: 632 mg via INTRAVENOUS
  Filled 2018-07-13: qty 31.6

## 2018-07-13 MED ORDER — SODIUM CHLORIDE 0.9 % IV SOLN: 1.0000 g | INTRAVENOUS | Status: DC

## 2018-07-13 MED ORDER — SODIUM CHLORIDE 0.9 % IV SOLN
INTRAVENOUS | Status: DC
  Administered 2018-07-13: 11:00:00 via INTRAVENOUS

## 2018-07-13 MED ORDER — MEGESTROL ACETATE 400 MG/10ML PO SUSP
200.0000 mg | Freq: Every day | ORAL | Status: DC
  Filled 2018-07-13 (×2): qty 10

## 2018-07-13 MED ORDER — SODIUM CHLORIDE 0.9 % IV SOLN
INTRAVENOUS | Status: AC
  Administered 2018-07-13: 17:00:00 via INTRAVENOUS

## 2018-07-13 MED ORDER — ENOXAPARIN SODIUM 30 MG/0.3ML ~~LOC~~ SOLN
30.0000 mg | Freq: Every day | SUBCUTANEOUS | Status: DC
  Administered 2018-07-13 – 2018-07-16 (×4): 30 mg via SUBCUTANEOUS
  Filled 2018-07-13 (×4): qty 0.3

## 2018-07-13 MED ORDER — SODIUM CHLORIDE 0.9 % IV SOLN
500.0000 mg | INTRAVENOUS | Status: DC
  Administered 2018-07-13 – 2018-07-16 (×4): 500 mg via INTRAVENOUS
  Filled 2018-07-13 (×4): qty 500

## 2018-07-13 MED ORDER — TOCILIZUMAB 400 MG/20ML IV SOLN
600.0000 mg | Freq: Once | INTRAVENOUS | Status: DC
  Filled 2018-07-13: qty 30

## 2018-07-13 MED ORDER — SODIUM CHLORIDE 0.9 % IV SOLN
INTRAVENOUS | Status: DC | PRN
  Administered 2018-07-13 – 2018-07-14 (×2): 1000 mL via INTRAVENOUS

## 2018-07-13 NOTE — Progress Notes (Signed)
AMS, not following commands, holding water in his mouth and not swallowing. Will hold PO meds at this time.

## 2018-07-13 NOTE — Progress Notes (Signed)
Critical lab: Lactic acid 2.1 MD made aware, patient already receiving IVF, no new orders Will continue to monitor

## 2018-07-13 NOTE — ED Notes (Signed)
ED TO INPATIENT HANDOFF REPORT  ED Nurse Name and Phone #: [phone]  S Name/Age/Gender Randy Sullivan 85 y.o. male Room/Bed: WA18/WA18  Code Status   Code Status: Prior  Home/SNF/Other Home Patient oriented to:  None Is this baseline? Yes   Triage Complete: Triage complete  Chief Complaint possible sepsis  Triage Note Per [facility] pt from home with AMS, tachycardic, CBG 107, rhonchi noted, 18g left AC, BP 120/70. Pt PCP called him in antibiotics yesterday for URI. Pt was more alert and at baseline yesterday, today more altered. Initial 74% on room air, was placed NRB 93-95%. Hx of dementia.    Allergies Allergies  Allergen Reactions  . Tizanidine Other (See Comments)    Hypotension     Level of Care/Admitting Diagnosis ED Disposition    ED Disposition Condition [facility] Area: [place] [[id number]]  Level of Care: Progressive [102]  Covid Evaluation: Confirmed COVID Positive  Isolation Risk Level: High Risk/Airborne (Aerosolizing procedure, nebulizer, intubated/ventilation, CPAP/BiPAP)  Diagnosis: COVID-19 [[id number]]  Admitting Physician: Caren Griffins [phone]  Attending Physician: Caren Griffins [phone]  Estimated length of stay: past midnight tomorrow  Certification:: I certify this patient will need inpatient services for at least 2 midnights  PT Class (Do Not Modify): Inpatient [101]  PT Acc Code (Do Not Modify): Private [1]       B Medical/Surgery History Past Medical History:  Diagnosis Date  . BPH (benign prostatic hyperplasia)   . Cancer ([place])    non melanoma skin cancers removed  . DJD (degenerative joint disease)   . Familial tremor   . GERD (gastroesophageal reflux disease)   . Heart murmur    hx of  . Hyperlipidemia   . Hypertension   . Hypothyroidism   . Mood disorder ([place])   . Osteoarthritis   . Prostatism   . Shortness of breath    mild with excertion  . Sleep apnea    cpap  . Syncope  01/13/2017  . Thyroid disease    Past Surgical History:  Procedure Laterality Date  . APPENDECTOMY    . EYE SURGERY     bil cataracts  . HEMORROIDECTOMY    . INNER EAR SURGERY Right    With impant.   Marland Kitchen LOOP RECORDER INSERTION N/A 10/06/2017   Procedure: LOOP RECORDER INSERTION;  Surgeon: Sanda Klein, MD;  Location: [facility];  Service: Cardiovascular;  Laterality: N/A;  . TONSILLECTOMY    . TOTAL KNEE ARTHROPLASTY Left 11/08/2016   Procedure: LEFT TOTAL KNEE ARTHROPLASTY;  Surgeon: Gaynelle Arabian, MD;  Location: [facility];  Service: Orthopedics;  Laterality: Left;  Adductor Block     A IV Location/Drains/Wounds Patient Lines/Drains/Airways Status   Active Line/Drains/Airways    Name:   Placement date:   Placement time:   Site:   Days:   Peripheral IV 07/05/2018 Right Antecubital   06/29/2018    1016    Antecubital   less than 1   Peripheral IV 07/12/2018 Left Antecubital   07/04/2018    1120    Antecubital   less than 1   External Urinary Catheter   11/10/16    2030    --   610   External Urinary Catheter   03/25/18    0746    --   110   Incision (Closed) 11/08/16 Knee Left   11/08/16    0959     612  Intake/Output Last 24 hours No intake or output data in the 24 hours ending 07/16/2018 1223  Labs/Imaging Results for orders placed or performed during the hospital encounter of 07/23/2018 (from the past 48 hour(s))  Lactic acid, plasma     Status: Abnormal   Collection Time: 07/16/2018  9:32 AM  Result Value Ref Range   Lactic Acid, Venous 2.2 (HH) 0.5 - 1.9 mmol/L    Comment: CRITICAL RESULT CALLED TO, READ BACK BY AND VERIFIED WITHSharlyn Bologna RN AT 1006 06/24/2018 MULLINS,T Performed at [facility], [place] [address], [place], [place] [address]   Comprehensive metabolic panel     Status: Abnormal   Collection Time: 07/12/2018  9:32 AM  Result Value Ref Range   Sodium 133 (L) 135 - 145 mmol/L   Potassium 3.8 3.5 - 5.1 mmol/L   Chloride 101 98 - 111  mmol/L   CO2 20 (L) 22 - 32 mmol/L   Glucose, Bld 96 70 - 99 mg/dL   BUN 28 (H) 8 - 23 mg/dL   Creatinine, Ser 1.88 (H) 0.61 - 1.24 mg/dL   Calcium 8.8 (L) 8.9 - 10.3 mg/dL   Total Protein 7.1 6.5 - 8.1 g/dL   Albumin 3.7 3.5 - 5.0 g/dL   AST 31 15 - 41 U/L   ALT 27 0 - 44 U/L   Alkaline Phosphatase 62 38 - 126 U/L   Total Bilirubin 0.6 0.3 - 1.2 mg/dL   GFR calc non Af Amer 31 (L) >60 mL/min   GFR calc Af Amer 36 (L) >60 mL/min   Anion gap 12 5 - 15    Comment: Performed at [facility], [place] [address] [place], [place] [address]  CBC WITH DIFFERENTIAL     Status: Abnormal   Collection Time: 07/23/2018  9:32 AM  Result Value Ref Range   WBC 4.2 4.0 - 10.5 K/uL   RBC 3.85 (L) 4.22 - 5.81 MIL/uL   Hemoglobin 12.0 (L) 13.0 - 17.0 g/dL   HCT 37.6 (L) 39.0 - 52.0 %   MCV 97.7 80.0 - 100.0 fL   MCH 31.2 26.0 - 34.0 pg   MCHC 31.9 30.0 - 36.0 g/dL   RDW 13.9 11.5 - 15.5 %   Platelets 167 150 - 400 K/uL   nRBC 0.0 0.0 - 0.2 %   Neutrophils Relative % 66 %   Neutro Abs 2.8 1.7 - 7.7 K/uL   Lymphocytes Relative 25 %   Lymphs Abs 1.0 0.7 - 4.0 K/uL   Monocytes Relative 7 %   Monocytes Absolute 0.3 0.1 - 1.0 K/uL   Eosinophils Relative 0 %   Eosinophils Absolute 0.0 0.0 - 0.5 K/uL   Basophils Relative 1 %   Basophils Absolute 0.0 0.0 - 0.1 K/uL   Immature Granulocytes 1 %   Abs Immature Granulocytes 0.02 0.00 - 0.07 K/uL    Comment: Performed at [facility], [place] [address] [place], [place] [address]  Blood Culture (routine x 2)     Status: None (Preliminary result)   Collection Time: 07/11/2018  9:32 AM  Result Value Ref Range   Specimen Description      BLOOD RIGHT ANTECUBITAL Performed at [facility], [place] [address] [place], [place] [address]    Special Requests      BOTTLES DRAWN AEROBIC AND ANAEROBIC Blood Culture adequate volume Performed at [place] [address] [place], [place] [address]    Culture  PENDING  Report Status PENDING   Urinalysis, Routine w reflex microscopic     Status: Abnormal   Collection Time: 07/17/2018  9:32 AM  Result Value Ref Range   Color, Urine YELLOW YELLOW   APPearance CLEAR CLEAR   Specific Gravity, Urine 1.016 1.005 - 1.030   pH 5.0 5.0 - 8.0   Glucose, UA NEGATIVE NEGATIVE mg/dL   Hgb urine dipstick NEGATIVE NEGATIVE   Bilirubin Urine NEGATIVE NEGATIVE   Ketones, ur NEGATIVE NEGATIVE mg/dL   Protein, ur 30 (A) NEGATIVE mg/dL   Nitrite NEGATIVE NEGATIVE   Leukocytes,Ua NEGATIVE NEGATIVE   RBC / HPF 0-5 0 - 5 RBC/hpf   WBC, UA 0-5 0 - 5 WBC/hpf   Bacteria, UA RARE (A) NONE SEEN   Mucus PRESENT     Comment: Performed at [facility], [place] [address], [place], [place] [address]  SARS Coronavirus 2 (CEPHEID- Performed in [place] hospital lab), Hosp Order     Status: Abnormal   Collection Time: 07/07/2018  9:32 AM  Result Value Ref Range   SARS Coronavirus 2 POSITIVE (A) NEGATIVE    Comment: RESULT CALLED TO, READ BACK BY AND VERIFIED WITH: S.BINGHAM RN AT 1124 ON 07/17/2018 BY S.VANHOORNE (NOTE) If result is NEGATIVE SARS-CoV-2 target nucleic acids are NOT DETECTED. The SARS-CoV-2 RNA is generally detectable in upper and lower  respiratory specimens during the acute phase of infection. The lowest  concentration of SARS-CoV-2 viral copies this assay can detect is 250  copies / mL. A negative result does not preclude SARS-CoV-2 infection  and should not be used as the sole basis for treatment or other  patient management decisions.  A negative result may occur with  improper specimen collection / handling, submission of specimen other  than nasopharyngeal swab, presence of viral mutation(s) within the  areas targeted by this assay, and inadequate number of viral copies  (<250 copies / mL). A negative result must be combined with clinical  observations, patient history, and epidemiological information. If result is  POSITIVE SARS-CoV-2 target nucleic acids are  DETECTED. The SARS-CoV-2 RNA is generally detectable in upper and lower  respiratory specimens during the acute phase of infection.  Positive  results are indicative of active infection with SARS-CoV-2.  Clinical  correlation with patient history and other diagnostic information is  necessary to determine patient infection status.  Positive results do  not rule out bacterial infection or co-infection with other viruses. If result is PRESUMPTIVE POSTIVE SARS-CoV-2 nucleic acids MAY BE PRESENT.   A presumptive positive result was obtained on the submitted specimen  and confirmed on repeat testing.  While 2019 novel coronavirus  (SARS-CoV-2) nucleic acids may be present in the submitted sample  additional confirmatory testing may be necessary for epidemiological  and / or clinical management purposes  to differentiate between  SARS-CoV-2 and other Sarbecovirus currently known to infect humans.  If clinically indicated additional testing with an alternate test  methodology  [phone]) is advised. The SARS-CoV-2 RNA is generally  detectable in upper and lower respiratory specimens during the acute  phase of infection. The expected result is Negative. Fact Sheet for Patients:  StrictlyIdeas.no Fact Sheet for Healthcare Providers: BankingDealers.co.za This test is not yet approved or cleared by the [place] FDA and has been authorized for detection and/or diagnosis of SARS-CoV-2 by FDA under an Emergency Use Authorization (EUA).  This EUA will remain in effect (meaning this test can be used) for the duration of the COVID-19 declaration  under Section 564(b)(1) of the Act, 21 U.S.C. section 360bbb-3(b)(1), unless the authorization is terminated or revoked sooner. Performed at [facility], [place] [address] [place], [place] [address]   Brain natriuretic peptide     Status:  Abnormal   Collection Time: 07/20/2018  9:32 AM  Result Value Ref Range   B Natriuretic Peptide 335.2 (H) 0.0 - 100.0 pg/mL    Comment: Performed at [facility], [place] [address], [place],  [address]  I-stat troponin, ED     Status: None   Collection Time: 06/29/2018  9:42 AM  Result Value Ref Range   Troponin i, poc 0.08 0.00 - 0.08 ng/mL   Comment 3            Comment: Due to the release kinetics of cTnI, a negative result within the first hours of the onset of symptoms does not rule out myocardial infarction with certainty. If myocardial infarction is still suspected, repeat the test at appropriate intervals.    Dg Chest Port 1 View  Result Date: 06/24/2018 CLINICAL DATA:  Shortness of breath. EXAM: PORTABLE CHEST 1 VIEW COMPARISON:  03/25/2018 FINDINGS: The cardiac silhouette is normal in size. An implanted loop recorder and aortic atherosclerosis are noted. Lung volumes are low with new right perihilar and right basilar opacity. The left lung is clear. No sizable pleural effusion or pneumothorax is identified. Right glenohumeral arthritis is noted. IMPRESSION: Low lung volumes with right perihilar and basilar opacity compatible with pneumonia. Electronically Signed   By: Logan Bores M.D.   On: 07/12/2018 10:42    Pending Labs Unresulted Labs (From admission, onward)    Start     Ordered   06/24/2018 3299  Urine culture  ONCE - STAT,   STAT     07/06/2018 0922   07/20/2018 0922  Blood Culture (routine x 2)  BLOOD CULTURE X 2,   STAT     07/17/2018 2426   Signed and Held  ABO/Rh  Once,   R     Signed and Held   Signed and Held  C-reactive protein  Once,   R     Signed and Held   Signed and Held  D-dimer, quantitative (not at [facility])  Once,   R     Signed and Held   Signed and Held  Ferritin  Once,   R     Signed and Held   Signed and Held  Hepatitis B surface antigen  Once,   R     Signed and Held   Signed and Held  Procalcitonin  Once,   R     Signed and Held    Signed and Held  Lactate dehydrogenase  Once,   R     Signed and Held   Signed and Held  CBC with Differential/Platelet  Daily,   R     Signed and Held   Signed and Held  C-reactive protein  Daily,   R     Signed and Held   Signed and Held  D-dimer, quantitative (not at [facility])  Daily,   R     Signed and Held   Signed and Held  Ferritin  Daily,   R     Signed and Held   Signed and Held  Magnesium  Daily,   R     Signed and Held   Signed and Held  Phosphorus  Daily,   R     Signed and Held  Vitals/Pain Today's Vitals   07/14/2018 1145 06/29/2018 1200 07/12/2018 1218 07/09/2018 1219  BP: (!) 102/52 (!) 105/52 (!) 112/52   Pulse: (!) 106 100 (!) 102   Resp: (!) 31 (!) 23 (!) 26   Temp:   99.5 F (37.5 C)   TempSrc:   Oral   SpO2: 100% 100%    Weight:      Height:      PainSc:    0-No pain    Isolation Precautions Droplet and Contact precautions  Medications Medications  cefTRIAXone (ROCEPHIN) 2 g in sodium chloride 0.9 % 100 mL IVPB (2 g Intravenous New Bag/Given 06/25/2018 1020)  azithromycin (ZITHROMAX) 500 mg in sodium chloride 0.9 % 250 mL IVPB (500 mg Intravenous New Bag/Given 07/03/2018 1023)  0.9 %  sodium chloride infusion (1,000 mLs Intravenous New Bag/Given 07/02/2018 1020)  0.9 %  sodium chloride infusion ( Intravenous New Bag/Given 07/02/2018 1036)  acetaminophen (TYLENOL) suppository 650 mg (650 mg Rectal Given 07/06/2018 1023)  sodium chloride 0.9 % bolus 500 mL (500 mLs Intravenous New Bag/Given 06/28/2018 1033)    Mobility non-ambulatory High fall risk   Focused Assessments Pulmonary Assessment Handoff:  Lung sounds: Bilateral Breath Sounds: Diminished L Breath Sounds: Diminished R Breath Sounds: Diminished O2 Device: NRB O2 Flow Rate (L/min): 10 L/min      R Recommendations: See Admitting Provider Note  Report given to:   Additional Notes: Patient is bed bound at home and reportedly has home health nurses. Pt has skin irritation to groin, buttocks,  and elbows Patient has a brief on and was cleaned by RN and Extern.

## 2018-07-13 NOTE — ED Triage Notes (Signed)
Per [facility] pt from home with AMS, tachycardic, CBG 107, rhonchi noted, 18g left AC, BP 120/70. Pt PCP called him in antibiotics yesterday for URI. Pt was more alert and at baseline yesterday, today more altered. Initial 74% on room air, was placed NRB 93-95%. Hx of dementia.

## 2018-07-13 NOTE — ED Notes (Signed)
Attempted to call report to [facility], RN reports she will call back.

## 2018-07-13 NOTE — ED Provider Notes (Signed)
[facility] DEPT Provider Note   CSN: [id number] Arrival date & time: 07/17/2018  0907    History   Chief Complaint No chief complaint on file.   HPI Randy Sullivan is a 85 y.o. male.     85 year old male who presents from home with altered mental status.  Patient noted to have increased cough and congestion.  Started on antibiotics by his PCP for URI yesterday.  According to EMS, patient has no initial response but then got worse today.  Patient does have a baseline history of dementia.  EMS was called and patient's room air sat was 74% and placed on nonrebreather which improved his sats to 95%.  Blood sugar was above 100.  Patient recently had a COVID test 2 weeks ago that per EMS was negative.  He felt warm to the touch per EMS.  Spoke to patient's wife and she states that patient is a DNR.     Past Medical History:  Diagnosis Date  . BPH (benign prostatic hyperplasia)   . Cancer ([place])    non melanoma skin cancers removed  . DJD (degenerative joint disease)   . Familial tremor   . GERD (gastroesophageal reflux disease)   . Heart murmur    hx of  . Hyperlipidemia   . Hypertension   . Hypothyroidism   . Mood disorder ([place])   . Osteoarthritis   . Prostatism   . Shortness of breath    mild with excertion  . Sleep apnea    cpap  . Syncope 01/13/2017  . Thyroid disease     Patient Active Problem List   Diagnosis Date Noted  . Goals of care, counseling/discussion   . Dysphagia   . Palliative care by specialist   . Acute metabolic encephalopathy   . CKD (chronic kidney disease) stage 3, GFR 30-59 ml/min (HCC)   . Dementia with behavioral disturbance ([place])   . Failure to thrive in adult   . Hyponatremia 06/15/2018  . Thyroid disease   . Sleep apnea   . Shortness of breath   . Prostatism   . Osteoarthritis   . Mood disorder ([place])   . Hypothyroidism   . Hypertension   . Hyperlipidemia   . Heart murmur   . GERD (gastroesophageal  reflux disease)   . Familial tremor   . DJD (degenerative joint disease)   . Cancer ([place])   . Seizure ([place]) 01/21/2017  . Transient speech disturbance   . CKD (chronic kidney disease), stage III ([place]) 01/13/2017  . Syncope 01/13/2017  . OA (osteoarthritis) of knee 11/08/2016  . Essential hypertension 02/05/2016  . Thoracic aortic atherosclerosis ([place]) 02/05/2016  . Benign familial tremor 02/05/2016  . COPD (chronic obstructive pulmonary disease) ([place]) 01/21/2016  . OSA (obstructive sleep apnea) 10/23/2015  . Upper airway cough syndrome 10/23/2015  . Exertional dyspnea 10/23/2015  . RLS (restless legs syndrome) 10/23/2015  . Osteoarthritis of knee 06/04/2010  . Dizziness 06/04/2010  . Benign hypertensive heart disease without heart failure 06/04/2010  . Hypercholesterolemia 06/04/2010  . BPH (benign prostatic hyperplasia) 06/04/2010    Past Surgical History:  Procedure Laterality Date  . APPENDECTOMY    . EYE SURGERY     bil cataracts  . HEMORROIDECTOMY    . INNER EAR SURGERY Right    With impant.   Marland Kitchen LOOP RECORDER INSERTION N/A 10/06/2017   Procedure: LOOP RECORDER INSERTION;  Surgeon: Sanda Klein, MD;  Location: [facility];  Service: Cardiovascular;  Laterality: N/A;  . TONSILLECTOMY    . TOTAL KNEE ARTHROPLASTY Left 11/08/2016   Procedure: LEFT TOTAL KNEE ARTHROPLASTY;  Surgeon: Gaynelle Arabian, MD;  Location: [facility];  Service: Orthopedics;  Laterality: Left;  Adductor Block        Home Medications    Prior to Admission medications   Medication Sig Start Date End Date Taking? Authorizing Provider  acetaminophen (TYLENOL) 500 MG tablet Take 1,000 mg by mouth 2 (two) times daily with a meal.     [provider]  aspirin 81 MG tablet Take 81 mg by mouth daily.    [provider]  Cholecalciferol (VITAMIN D-3) 25 MCG (1000 UT) CAPS Take 1,000 Units by mouth daily.    [provider]  gabapentin (NEURONTIN) 100 MG capsule Take 100 mg  by mouth at bedtime. 10/28/16   [provider]  iron polysaccharides (NIFEREX) 150 MG capsule Take 150 mg by mouth every Tuesday.     [provider]  levothyroxine (SYNTHROID, LEVOTHROID) 112 MCG tablet Take 112 mcg by mouth daily before breakfast.  10/14/14   [provider]  loratadine (CLARITIN) 10 MG tablet Take 10 mg by mouth at bedtime.     [provider]  megestrol (MEGACE) 400 MG/10ML suspension Take 5 mLs (200 mg total) by mouth daily. 06/19/18   Georgette Shell, MD  mirabegron ER (MYRBETRIQ) 50 MG TB24 tablet Take 50 mg by mouth every evening.     [provider]  montelukast (SINGULAIR) 10 MG tablet TAKE ONE TABLET AT BEDTIME. 06/27/18   Rigoberto Noel, MD  omeprazole (PRILOSEC) 20 MG capsule Take 20 mg by mouth daily.    [provider]  Polyethyl Glycol-Propyl Glycol (SYSTANE OP) Place 1-2 drops into both eyes as needed (for dry eyes).     [provider]  propranolol (INDERAL) 20 MG tablet Take 20 mg by mouth 2 (two) times daily.  06/18/16   [provider]  saccharomyces boulardii (FLORASTOR) 250 MG capsule Take 250 mg by mouth at bedtime.     [provider]  triamcinolone cream (KENALOG) 0.1 % Apply 1 application topically daily as needed for itching.    [provider]  vitamin B-12 (CYANOCOBALAMIN) 1000 MCG tablet Take 1,000 mcg by mouth daily.    [provider]    Family History Family History  Problem Relation Age of Onset  . Heart attack Father   . Colon cancer Mother   . Colon cancer Sister     Social History Social History   Tobacco Use  . Smoking status: Former Smoker    Packs/day: 2.00    Years: 18.00    Pack years: 36.00    Types: Cigarettes    Last attempt to quit: 06/04/1970    Years since quitting: 48.1  . Smokeless tobacco: Never Used  Substance Use Topics  . Alcohol use: Yes    Alcohol/week: 0.0 standard drinks    Comment: special occasions  . Drug  use: No     Allergies   Tizanidine   Review of Systems Review of Systems  Unable to perform ROS: Acuity of condition     Physical Exam Updated Vital Signs There were no vitals taken for this visit.  Physical Exam Vitals signs and nursing note reviewed.  Constitutional:      General: He is not in acute distress.    Appearance: Normal appearance. He is well-developed. He is not toxic-appearing.  HENT:  Head: Normocephalic and atraumatic.  Eyes:     General: Lids are normal.     Conjunctiva/sclera: Conjunctivae normal.     Pupils: Pupils are equal, round, and reactive to light.  Neck:     Musculoskeletal: Normal range of motion and neck supple.     Thyroid: No thyroid mass.     Trachea: No tracheal deviation.  Cardiovascular:     Rate and Rhythm: Normal rate and regular rhythm.     Heart sounds: Normal heart sounds. No murmur. No gallop.   Pulmonary:     Effort: Pulmonary effort is normal. No respiratory distress.     Breath sounds: Normal breath sounds. No stridor. No decreased breath sounds, wheezing, rhonchi or rales.  Abdominal:     General: Bowel sounds are normal. There is no distension.     Palpations: Abdomen is soft.     Tenderness: There is no abdominal tenderness. There is no rebound.  Musculoskeletal: Normal range of motion.        General: No tenderness.  Skin:    General: Skin is warm and dry.     Findings: No abrasion or rash.  Neurological:     Mental Status: He is lethargic and disoriented.     GCS: GCS eye subscore is 3. GCS verbal subscore is 2. GCS motor subscore is 5.     Cranial Nerves: No facial asymmetry.     Motor: No tremor.  Psychiatric:        Attention and Perception: He is inattentive.      ED Treatments / Results  Labs (all labs ordered are listed, but only abnormal results are displayed) Labs Reviewed  CULTURE, BLOOD (ROUTINE X 2)  CULTURE, BLOOD (ROUTINE X 2)  SARS CORONAVIRUS 2 (HOSPITAL ORDER, [facility])  URINE CULTURE  LACTIC ACID, PLASMA  LACTIC ACID, PLASMA  COMPREHENSIVE METABOLIC PANEL  CBC WITH DIFFERENTIAL/PLATELET  URINALYSIS, ROUTINE W REFLEX MICROSCOPIC  BRAIN NATRIURETIC PEPTIDE  I-STAT TROPONIN, ED    EKG EKG Interpretation  Date/Time:  Thursday Jul 13 2018 09:19:58 EDT Ventricular Rate:  137 PR Interval:    QRS Duration: 92 QT Interval:  351 QTC Calculation: 519 R Axis:   99 Text Interpretation:  Atrial fibrillation Paired ventricular premature complexes Right axis deviation Low voltage, extremity leads Prolonged QT interval Confirmed by Lacretia Leigh ([id number]) on 07/16/2018 11:38:44 AM   Radiology No results found.  Procedures Procedures (including critical care time)  Medications Ordered in ED Medications  acetaminophen (TYLENOL) suppository 650 mg (has no administration in time range)     Initial Impression / Assessment and Plan / ED Course  I have reviewed the triage vital signs and the nursing notes.  Pertinent labs & imaging results that were available during my care of the patient were reviewed by me and considered in my medical decision making (see chart for details).        Discussed with patient's wife by phone about his condition and patient is a DNR.  Did become hypotensive and required fluid resuscitation.  Chest x-ray consistent with pneumonia and patient started on IV antibiotics.  Patient's COVID test is positive.  Discussed with tried hospitalist who will admit the patient and discussed the findings with the patient's wife   Randy Sullivan was evaluated in Emergency Department on 07/02/2018 for the symptoms described in the history of present illness. He was evaluated in the context of the global COVID-19 pandemic, which necessitated consideration that the  patient might be at risk for infection with the SARS-CoV-2 virus that causes COVID-19. Institutional protocols and algorithms that pertain to the evaluation of patients at  risk for COVID-19 are in a state of rapid change based on information released by regulatory bodies including the CDC and federal and state organizations. These policies and algorithms were followed during the patient's care in the ED.  CRITICAL CARE Performed by: Leota Jacobsen Total critical care time: 60 minutes Critical care time was exclusive of separately billable procedures and treating other patients. Critical care was necessary to treat or prevent imminent or life-threatening deterioration. Critical care was time spent personally by me on the following activities: development of treatment plan with patient and/or surrogate as well as nursing, discussions with consultants, evaluation of patient's response to treatment, examination of patient, obtaining history from patient or surrogate, ordering and performing treatments and interventions, ordering and review of laboratory studies, ordering and review of radiographic studies, pulse oximetry and re-evaluation of patient's condition.   Final Clinical Impressions(s) / ED Diagnoses   Final diagnoses:  None    ED Discharge Orders    None       Lacretia Leigh, MD 06/26/2018 1139

## 2018-07-13 NOTE — TOC Initial Note (Signed)
Transition of Care [facility]) - Initial/Assessment Note    Patient Details  Name: Randy Sullivan MRN: [id number] Date of Birth: 12/29/1928  Transition of Care [facility] CM/SW Contact:    Midge Minium RN, BSN, NCM-BC, ACM-RN [phone] Phone Number: 07/10/2018, 3:20 PM  Clinical Narrative:                 CM consult acknowledged. 85yo male presented with AMS, found to be 74% on RA and COVID (+). PMH: hypertension, hyperlipidemia, dementia, hypothyroidism. Patient lives at home with his spouse and has 24/7 caregivers being provided by [facility] Palliative care. CM team will continue to follow for progression needs.   Expected Discharge Plan: Home/Self Care Barriers to Discharge: Continued Medical Work up   Expected Discharge Plan and Services Expected Discharge Plan: Home/Self Care   Discharge Planning Services: CM Consult   Living arrangements for the past 2 months: Single Family Home Expected Discharge Date: (unknown)                Prior Living Arrangements/Services Living arrangements for the past 2 months: Single Family Home Lives with:: Self, Spouse              Current home services: Other (comment)(24/7 caregivers with [facility])    Activities of Daily Living Home Assistive Devices/Equipment: Bedside commode/3-in-1, Walker (specify type), Shower chair without back, Grab bars in shower, Hand-held shower hose, Eyeglasses, Other (Comment)(inplanted hearing aids, front wheeled walker) ADL Screening (condition at time of admission) Patient's cognitive ability adequate to safely complete daily activities?: No Is the patient deaf or have difficulty hearing?: Yes(has implanted hearing aids) Does the patient have difficulty seeing, even when wearing glasses/contacts?: No Does the patient have difficulty concentrating, remembering, or making decisions?: Yes Patient able to express need for assistance with ADLs?: No Does the patient have difficulty dressing or bathing?:  Yes Independently performs ADLs?: No Communication: Independent Grooming: Dependent Is this a change from baseline?: Change from baseline, expected to last >3 days Feeding: Dependent Is this a change from baseline?: Change from baseline, expected to last >3 days Bathing: Dependent Is this a change from baseline?: Change from baseline, expected to last >3 days Toileting: Dependent Is this a change from baseline?: Change from baseline, expected to last >3days In/Out Bed: Dependent Is this a change from baseline?: Change from baseline, expected to last >3 days Walks in Home: Dependent Is this a change from baseline?: Change from baseline, expected to last >3 days Does the patient have difficulty walking or climbing stairs?: Yes Weakness of Legs: Both Weakness of Arms/Hands: Both   Admission diagnosis:  COVID-19 [U07.1, J98.8] Patient Active Problem List   Diagnosis Date Noted  . COVID-19 07/17/2018  . Goals of care, counseling/discussion   . Dysphagia   . Palliative care by specialist   . Acute metabolic encephalopathy   . CKD (chronic kidney disease) stage 3, GFR 30-59 ml/min (HCC)   . Dementia with behavioral disturbance ([place])   . Failure to thrive in adult   . Hyponatremia 06/15/2018  . Thyroid disease   . Sleep apnea   . Shortness of breath   . Prostatism   . Osteoarthritis   . Mood disorder ([place])   . Hypothyroidism   . Hypertension   . Hyperlipidemia   . Heart murmur   . GERD (gastroesophageal reflux disease)   . Familial tremor   . DJD (degenerative joint disease)   . Cancer ([place])   . Seizure ([place]) 01/21/2017  . Transient  speech disturbance   . CKD (chronic kidney disease), stage III ([place]) 01/13/2017  . Syncope 01/13/2017  . OA (osteoarthritis) of knee 11/08/2016  . Essential hypertension 02/05/2016  . Thoracic aortic atherosclerosis ([place]) 02/05/2016  . Benign familial tremor 02/05/2016  . COPD (chronic obstructive pulmonary disease) ([place]) 01/21/2016  . OSA  (obstructive sleep apnea) 10/23/2015  . Upper airway cough syndrome 10/23/2015  . Exertional dyspnea 10/23/2015  . RLS (restless legs syndrome) 10/23/2015  . Osteoarthritis of knee 06/04/2010  . Dizziness 06/04/2010  . Benign hypertensive heart disease without heart failure 06/04/2010  . Hypercholesterolemia 06/04/2010  . BPH (benign prostatic hyperplasia) 06/04/2010   PCP:  Prince Solian, MD Pharmacy:   [facility], [place] [address] Phone: [phone] Fax: [phone]     Social Determinants of Health (SDOH) Interventions    Readmission Risk Interventions No flowsheet data found.

## 2018-07-13 NOTE — Progress Notes (Signed)
At shift report patient was in bed resting, on Room Air O2 sats 95%.  Patient was non-verbal and not following commands.  He would track the day nurse and I, but that is all.  He also was having issues swallowing.  The day RN stated she gave him a drink of water and he just held it in his mouth.  She ended up suctioning it out.  While in room he coughed up some secretions and just held it in his mouth until it was suctioned out by nurse.  I was also told in report he is bedbound at home and nonverbal is his baseline.  According to wife this is not true.  The wife called shortly after at 2030.  She was upset that no one had called since he had arrived at the hospital.  I talk to her and let her know the doctor will probably reach out to her tomorrow, but at the moment the patient is stable and no longer on oxygen.  The wife was happy about this.  She also told me the patient was talking, and walking around with a walker as of yesterday.  He was weaker than usual, but the nonverbal started this morning for her.  She said before today he ate, talked, and got around just fine.   After talking to wife I tried to assess patient.  He was sleeping now.  I was able to get a little response from him.  I asked his name and date of birth.  I could barely hear him, but it sounded as if he was trying to say his own name and birth date.  I did hear him say 6 and then 22 something, but I could barely hear it. I will try again to assess patient once he wakes up more.

## 2018-07-13 NOTE — H&P (Signed)
History and Physical    Randy Sullivan IDP:[id number] DOB: [birth year] DOA: 07/17/2018  I have briefly reviewed the patient's prior medical records in [place]  PCP: Prince Solian, MD  Patient coming from: home  Chief Complaint: Altered mental status  HPI: Randy Sullivan is a 85 y.o. male with medical history significant of 85 year old male with history of hypertension, hyperlipidemia, dementia, hypothyroidism, comes to the hospital with complaints of altered mental status and difficulty breathing.  Patient has underlying dementia and confusion and cannot contribute to the story, called and discussed with wife over the phone.  She tells me that he was okay up until couple days ago and then start developing fevers, increased cough and congestion.  His PCP started him on antibiotics yesterday.  Wife reports that he was becoming increasingly weak.  When EMS arrived he was found to be 74% on room air and was placed on nonrebreather and brought to the emergency room.   ED Course: In the ED he was found to be hypoxic requiring a nonrebreather, he is tachypneic, febrile to 101.1, tachycardic and initially hypotensive but that improved after fluids.  His blood work reveals a creatinine of 1.88 from prior normal values.  Lactic acid is 2.2. Chest x-ray with right perihilar and basilar pneumonia  Review of Systems: As per HPI otherwise 10 point review of systems negative.   Past Medical History:  Diagnosis Date  . BPH (benign prostatic hyperplasia)   . Cancer ([place])    non melanoma skin cancers removed  . DJD (degenerative joint disease)   . Familial tremor   . GERD (gastroesophageal reflux disease)   . Heart murmur    hx of  . Hyperlipidemia   . Hypertension   . Hypothyroidism   . Mood disorder ([place])   . Osteoarthritis   . Prostatism   . Shortness of breath    mild with excertion  . Sleep apnea    cpap  . Syncope 01/13/2017  . Thyroid disease     Past Surgical History:   Procedure Laterality Date  . APPENDECTOMY    . EYE SURGERY     bil cataracts  . HEMORROIDECTOMY    . INNER EAR SURGERY Right    With impant.   Marland Kitchen LOOP RECORDER INSERTION N/A 10/06/2017   Procedure: LOOP RECORDER INSERTION;  Surgeon: Sanda Klein, MD;  Location: [facility];  Service: Cardiovascular;  Laterality: N/A;  . TONSILLECTOMY    . TOTAL KNEE ARTHROPLASTY Left 11/08/2016   Procedure: LEFT TOTAL KNEE ARTHROPLASTY;  Surgeon: Gaynelle Arabian, MD;  Location: [facility];  Service: Orthopedics;  Laterality: Left;  Adductor Block     reports that he quit smoking about 48 years ago. His smoking use included cigarettes. He has a 36.00 pack-year smoking history. He has never used smokeless tobacco. He reports current alcohol use. He reports that he does not use drugs.  Allergies  Allergen Reactions  . Tizanidine Other (See Comments)    Hypotension     Family History  Problem Relation Age of Onset  . Heart attack Father   . Colon cancer Mother   . Colon cancer Sister     Prior to Admission medications   Medication Sig Start Date End Date Taking? Authorizing Provider  acetaminophen (TYLENOL) 500 MG tablet Take 1,000 mg by mouth 2 (two) times daily with a meal.    Yes [provider]  Ascorbic Acid (VITAMIN C) 1000 MG tablet Take 1,000 mg by mouth  daily.   Yes [provider]  aspirin 81 MG tablet Take 81 mg by mouth daily.   Yes [provider]  cefdinir (OMNICEF) 300 MG capsule Take 300 mg by mouth 2 (two) times daily. 7 day supply 07/12/18  Yes [provider]  Cholecalciferol (VITAMIN D-3) 25 MCG (1000 UT) CAPS Take 1,000 Units by mouth daily.   Yes [provider]  donepezil (ARICEPT) 10 MG tablet Take 10 mg by mouth daily. 06/28/18  Yes [provider]  fluticasone (FLONASE) 50 MCG/ACT nasal spray Place 2 sprays into both nostrils daily.  07/12/18  Yes [provider]  gabapentin (NEURONTIN) 100 MG capsule Take 100 mg  by mouth at bedtime. 10/28/16  Yes [provider]  iron polysaccharides (NIFEREX) 150 MG capsule Take 150 mg by mouth every Tuesday.    Yes [provider]  levothyroxine (SYNTHROID, LEVOTHROID) 112 MCG tablet Take 112 mcg by mouth daily before breakfast.  10/14/14  Yes [provider]  loratadine (CLARITIN) 10 MG tablet Take 10 mg by mouth at bedtime.    Yes [provider]  megestrol (MEGACE) 400 MG/10ML suspension Take 5 mLs (200 mg total) by mouth daily. 06/19/18  Yes Georgette Shell, MD  mirabegron ER (MYRBETRIQ) 50 MG TB24 tablet Take 50 mg by mouth every evening.    Yes [provider]  montelukast (SINGULAIR) 10 MG tablet TAKE ONE TABLET AT BEDTIME. Patient taking differently: Take 10 mg by mouth at bedtime.  06/27/18  Yes Rigoberto Noel, MD  Polyethyl Glycol-Propyl Glycol (SYSTANE OP) Place 1-2 drops into both eyes as needed (for dry eyes).    Yes [provider]  propranolol (INDERAL) 20 MG tablet Take 20 mg by mouth daily.  06/18/16  Yes [provider]  saccharomyces boulardii (FLORASTOR) 250 MG capsule Take 250 mg by mouth at bedtime.    Yes [provider]  triamcinolone cream (KENALOG) 0.1 % Apply 1 application topically daily as needed for itching.   Yes [provider]  vitamin B-12 (CYANOCOBALAMIN) 1000 MCG tablet Take 1,000 mcg by mouth daily.   Yes [provider]    Physical Exam: Vitals:   07/06/2018 0950 07/23/2018 1024 07/06/2018 1115 07/07/2018 1130  BP:  (!) 92/44 (!) 95/50 (!) 118/50  Pulse:   (!) 106 (!) 110  Resp:  (!) 26 (!) 36 (!) 33  Temp:      TempSrc:      SpO2:   100% 100%  Weight: 76.7 kg     Height: 5\' 11"  (1.803 m)         Constitutional: Alert, does not answer any of my questions Eyes: PERRL, lids and conjunctivae normal, no scleral icterus Neck: normal, supple Respiratory: Decreased at the bases, faint rhonchi, no wheezing or crackles Cardiovascular: Regular rate  and rhythm, no murmurs appreciated. No extremity edema. 2+ pedal pulses.  Abdomen: no tenderness, no masses palpated. Bowel sounds positive.  Musculoskeletal: no clubbing / cyanosis. Normal muscle tone.  Skin: no rashes, lesions, ulcers. No induration Neurologic: CN 2-12 grossly intact. Strength 5/5 in all 4.  Does not follow commands consistently Psychiatric: Alert to self  Labs on Admission: I have personally reviewed following labs and imaging studies  CBC: Recent Labs  Lab 07/17/2018 0932  WBC 4.2  NEUTROABS 2.8  HGB 12.0*  HCT 37.6*  MCV 97.7  PLT 161   Basic Metabolic Panel: Recent Labs  Lab 07/04/2018 0932  NA 133*  K 3.8  CL 101  CO2 20*  GLUCOSE 96  BUN 28*  CREATININE 1.88*  CALCIUM 8.8*   GFR: Estimated Creatinine Clearance: 28.9 mL/min (A) (by C-G formula based on SCr of 1.88 mg/dL (H)). Liver Function Tests: Recent Labs  Lab 07/03/2018 0932  AST 31  ALT 27  ALKPHOS 62  BILITOT 0.6  PROT 7.1  ALBUMIN 3.7   No results for input(s): LIPASE, AMYLASE in the last 168 hours. No results for input(s): AMMONIA in the last 168 hours. Coagulation Profile: No results for input(s): INR, PROTIME in the last 168 hours. Cardiac Enzymes: No results for input(s): CKTOTAL, CKMB, CKMBINDEX, TROPONINI in the last 168 hours. BNP (last 3 results) No results for input(s): PROBNP in the last 8760 hours. HbA1C: No results for input(s): HGBA1C in the last 72 hours. CBG: No results for input(s): GLUCAP in the last 168 hours. Lipid Profile: No results for input(s): CHOL, HDL, LDLCALC, TRIG, CHOLHDL, LDLDIRECT in the last 72 hours. Thyroid Function Tests: No results for input(s): TSH, T4TOTAL, FREET4, T3FREE, THYROIDAB in the last 72 hours. Anemia Panel: No results for input(s): VITAMINB12, FOLATE, FERRITIN, TIBC, IRON, RETICCTPCT in the last 72 hours. Urine analysis:    Component Value Date/Time   COLORURINE YELLOW 07/19/2018 0932   APPEARANCEUR CLEAR 07/15/2018 0932    LABSPEC 1.016 07/06/2018 0932   PHURINE 5.0 07/14/2018 0932   GLUCOSEU NEGATIVE 07/11/2018 0932   HGBUR NEGATIVE 07/22/2018 0932   BILIRUBINUR NEGATIVE 07/11/2018 0932   KETONESUR NEGATIVE 07/12/2018 0932   PROTEINUR 30 (A) 06/24/2018 0932   NITRITE NEGATIVE 07/17/2018 0932   LEUKOCYTESUR NEGATIVE 07/02/2018 0932     Radiological Exams on Admission: Dg Chest Port 1 View  Result Date: 06/28/2018 CLINICAL DATA:  Shortness of breath. EXAM: PORTABLE CHEST 1 VIEW COMPARISON:  03/25/2018 FINDINGS: The cardiac silhouette is normal in size. An implanted loop recorder and aortic atherosclerosis are noted. Lung volumes are low with new right perihilar and right basilar opacity. The left lung is clear. No sizable pleural effusion or pneumothorax is identified. Right glenohumeral arthritis is noted. IMPRESSION: Low lung volumes with right perihilar and basilar opacity compatible with pneumonia. Electronically Signed   By: Logan Bores M.D.   On: 07/08/2018 10:42    EKG: Independently reviewed.  Poor tracing, appears to be A. fib, but not clear.  Prolonged QT at 519  Assessment/Plan Active Problems:   COVID-19   Principal Problem Severe sepsis due to pneumonia/in the setting of COVID-19 with acute hypoxic respiratory failure -Patient requires nonrebreather, will admit to [facility], was given IV antibiotics in the ED, will continue.  Today is day 3 of symptoms -Discussed with wife over the phone, patient is a DNR.  Given hypoxia and infiltrates on the chest x-ray will treat with Actemra, risks/benefits discussed with wife and she consents to this medication being administered -obtain inflammatory markers and continue to monitor daily -Supportive treatment, inhalers, antitussives  Active Problems Acute kidney injury -Likely in the setting of sepsis, will give IV fluids and monitor renal function  Hypertension -Hold antihypertensives as patient is hypotensive in the ED  Dementia  -Continue home medications  Hypothyroidism -continue Synthroid  BPH -continue home medications   DVT prophylaxis: Lovenox  Code Status: DNR  Family Communication: Discussed with wife over the phone Disposition Plan: Admit to [facility], progressive Consults called: None   Marzetta Board, MD, PhD [facility]  Contact via [email or web address]  [facility] Office Info P: [phone]  F: [phone]   07/12/2018,  12:01 PM

## 2018-07-13 NOTE — ED Notes (Signed)
MD made aware of low BP  

## 2018-07-13 NOTE — ED Notes (Signed)
Bed: DX41 Expected date:  Expected time:  Means of arrival:  Comments: EMS sepsis- non verbal, tachypnea, tachycardic, fever

## 2018-07-14 ENCOUNTER — Inpatient Hospital Stay (HOSPITAL_COMMUNITY): Payer: MEDICARE

## 2018-07-14 DIAGNOSIS — L8990 Pressure ulcer of unspecified site, unspecified stage: Secondary | ICD-10-CM

## 2018-07-14 DIAGNOSIS — J181 Lobar pneumonia, unspecified organism: Secondary | ICD-10-CM

## 2018-07-14 LAB — CBC WITH DIFFERENTIAL/PLATELET
Abs Immature Granulocytes: 0.5 10*3/uL — ABNORMAL HIGH (ref 0.00–0.07)
Band Neutrophils: 32 %
Basophils Absolute: 0 10*3/uL (ref 0.0–0.1)
Basophils Relative: 0 %
Eosinophils Absolute: 0 10*3/uL (ref 0.0–0.5)
Eosinophils Relative: 0 %
HCT: 33.9 % — ABNORMAL LOW (ref 39.0–52.0)
Hemoglobin: 11.5 g/dL — ABNORMAL LOW (ref 13.0–17.0)
Lymphocytes Relative: 6 %
Lymphs Abs: 0.6 10*3/uL — ABNORMAL LOW (ref 0.7–4.0)
MCH: 32.7 pg (ref 26.0–34.0)
MCHC: 33.9 g/dL (ref 30.0–36.0)
MCV: 96.3 fL (ref 80.0–100.0)
Metamyelocytes Relative: 1 %
Monocytes Absolute: 0.2 10*3/uL (ref 0.1–1.0)
Monocytes Relative: 2 %
Myelocytes: 4 %
Neutro Abs: 8.6 10*3/uL — ABNORMAL HIGH (ref 1.7–7.7)
Neutrophils Relative %: 55 %
Platelets: 140 10*3/uL — ABNORMAL LOW (ref 150–400)
RBC: 3.52 MIL/uL — ABNORMAL LOW (ref 4.22–5.81)
RDW: 14 % (ref 11.5–15.5)
WBC Morphology: INCREASED
WBC: 9.9 10*3/uL (ref 4.0–10.5)
nRBC: 0 % (ref 0.0–0.2)

## 2018-07-14 LAB — CREATININE, URINE, RANDOM: Creatinine, Urine: 139.23 mg/dL

## 2018-07-14 LAB — BLOOD CULTURE ID PANEL (REFLEXED)

## 2018-07-14 LAB — BASIC METABOLIC PANEL
Anion gap: 12 (ref 5–15)
BUN: 38 mg/dL — ABNORMAL HIGH (ref 8–23)
CO2: 16 mmol/L — ABNORMAL LOW (ref 22–32)
Calcium: 8.2 mg/dL — ABNORMAL LOW (ref 8.9–10.3)
Chloride: 107 mmol/L (ref 98–111)
Creatinine, Ser: 2.01 mg/dL — ABNORMAL HIGH (ref 0.61–1.24)
GFR calc Af Amer: 33 mL/min — ABNORMAL LOW (ref >60)
GFR calc non Af Amer: 29 mL/min — ABNORMAL LOW (ref >60)
Glucose, Bld: 130 mg/dL — ABNORMAL HIGH (ref 70–99)
Potassium: 4.3 mmol/L (ref 3.5–5.1)
Sodium: 135 mmol/L (ref 135–145)

## 2018-07-14 LAB — URINE CULTURE: Culture: NO GROWTH

## 2018-07-14 LAB — LACTATE DEHYDROGENASE: LDH: 193 U/L — ABNORMAL HIGH (ref 98–192)

## 2018-07-14 LAB — PROTEIN / CREATININE RATIO, URINE
Creatinine, Urine: 142.16 mg/dL
Protein Creatinine Ratio: 0.91 mg/mg{Cre} — ABNORMAL HIGH (ref 0.00–0.15)
Total Protein, Urine: 130 mg/dL

## 2018-07-14 LAB — D-DIMER, QUANTITATIVE: D-Dimer, Quant: 3.05 ug/mL-FEU — ABNORMAL HIGH (ref 0.00–0.50)

## 2018-07-14 LAB — FERRITIN: Ferritin: 1702 ng/mL — ABNORMAL HIGH (ref 24–336)

## 2018-07-14 LAB — ABO/RH: ABO/RH(D): O NEG

## 2018-07-14 LAB — PHOSPHORUS: Phosphorus: 5.1 mg/dL — ABNORMAL HIGH (ref 2.5–4.6)

## 2018-07-14 LAB — C-REACTIVE PROTEIN: CRP: 24.9 mg/dL — ABNORMAL HIGH (ref <1.0)

## 2018-07-14 LAB — SODIUM, URINE, RANDOM: Sodium, Ur: 54 mmol/L

## 2018-07-14 LAB — PROCALCITONIN: Procalcitonin: 19.03 ng/mL

## 2018-07-14 LAB — MAGNESIUM: Magnesium: 1.8 mg/dL (ref 1.7–2.4)

## 2018-07-14 MED ORDER — PROPRANOLOL HCL 20 MG PO TABS
20.0000 mg | ORAL_TABLET | Freq: Once | ORAL | Status: DC
  Filled 2018-07-14: qty 1

## 2018-07-14 MED ORDER — IPRATROPIUM-ALBUTEROL 20-100 MCG/ACT IN AERS: 1.0000 | INHALATION_SPRAY | Freq: Four times a day (QID) | RESPIRATORY_TRACT | Status: DC | PRN

## 2018-07-14 MED ORDER — LEVOTHYROXINE SODIUM 50 MCG PO TABS
100.0000 ug | ORAL_TABLET | Freq: Every day | ORAL | Status: DC
  Administered 2018-07-15: 100 ug via ORAL
  Filled 2018-07-14: qty 2

## 2018-07-14 MED ORDER — LORAZEPAM 2 MG/ML IJ SOLN
0.5000 mg | Freq: Once | INTRAMUSCULAR | Status: AC
  Administered 2018-07-14: 0.5 mg via INTRAVENOUS
  Filled 2018-07-14: qty 1

## 2018-07-14 MED ORDER — FUROSEMIDE 10 MG/ML IJ SOLN
20.0000 mg | Freq: Once | INTRAMUSCULAR | Status: AC
  Administered 2018-07-14: 20 mg via INTRAVENOUS
  Filled 2018-07-14: qty 2

## 2018-07-14 MED ORDER — METOPROLOL TARTRATE 5 MG/5ML IV SOLN
5.0000 mg | Freq: Once | INTRAVENOUS | Status: AC
  Administered 2018-07-14: 5 mg via INTRAVENOUS
  Filled 2018-07-14: qty 5

## 2018-07-14 MED ORDER — HYDRALAZINE HCL 20 MG/ML IJ SOLN: 5.0000 mg | Freq: Four times a day (QID) | INTRAMUSCULAR | Status: DC | PRN

## 2018-07-14 MED ORDER — LEVOTHYROXINE SODIUM 100 MCG/5ML IV SOLN
50.0000 ug | Freq: Every day | INTRAVENOUS | Status: DC
  Filled 2018-07-14: qty 5

## 2018-07-14 MED ORDER — METOPROLOL TARTRATE 5 MG/5ML IV SOLN
5.0000 mg | Freq: Four times a day (QID) | INTRAVENOUS | Status: DC | PRN
  Administered 2018-07-14 – 2018-07-16 (×4): 5 mg via INTRAVENOUS
  Filled 2018-07-14 (×4): qty 5

## 2018-07-14 MED ORDER — SODIUM CHLORIDE 0.9 % IV BOLUS
1000.0000 mL | Freq: Once | INTRAVENOUS | Status: AC
  Administered 2018-07-14: 1000 mL via INTRAVENOUS

## 2018-07-14 MED ORDER — METOPROLOL TARTRATE 25 MG PO TABS
25.0000 mg | ORAL_TABLET | Freq: Two times a day (BID) | ORAL | Status: DC
  Administered 2018-07-14 – 2018-07-15 (×2): 25 mg via ORAL
  Filled 2018-07-14 (×4): qty 1

## 2018-07-14 MED ORDER — RESOURCE THICKENUP CLEAR PO POWD
ORAL | Status: DC | PRN
  Filled 2018-07-14: qty 125

## 2018-07-14 NOTE — Evaluation (Signed)
Clinical/Bedside Swallow Evaluation Patient Details  Name: Randy Sullivan MRN: [id number] Date of Birth: Apr 06, 1928  Today's Date: 07/14/2018 Time: SLP Start Time (ACUTE ONLY): 1120 SLP Stop Time (ACUTE ONLY): 1140 SLP Time Calculation (min) (ACUTE ONLY): 20 min  Past Medical History:  Past Medical History:  Diagnosis Date  . BPH (benign prostatic hyperplasia)   . Cancer ([place])    non melanoma skin cancers removed  . DJD (degenerative joint disease)   . Familial tremor   . GERD (gastroesophageal reflux disease)   . Heart murmur    hx of  . Hyperlipidemia   . Hypertension   . Hypothyroidism   . Mood disorder ([place])   . Osteoarthritis   . Prostatism   . Shortness of breath    mild with excertion  . Sleep apnea    cpap  . Syncope 01/13/2017  . Thyroid disease    Past Surgical History:  Past Surgical History:  Procedure Laterality Date  . APPENDECTOMY    . EYE SURGERY     bil cataracts  . HEMORROIDECTOMY    . INNER EAR SURGERY Right    With impant.   Marland Kitchen LOOP RECORDER INSERTION N/A 10/06/2017   Procedure: LOOP RECORDER INSERTION;  Surgeon: Sanda Klein, MD;  Location: [facility];  Service: Cardiovascular;  Laterality: N/A;  . TONSILLECTOMY    . TOTAL KNEE ARTHROPLASTY Left 11/08/2016   Procedure: LEFT TOTAL KNEE ARTHROPLASTY;  Surgeon: Gaynelle Arabian, MD;  Location: [facility];  Service: Orthopedics;  Laterality: Left;  Adductor Block   HPI:  Randy Sullivan is a 85 y.o. male with medical history significant of dementia hypertension, hyperlipidemia, dementia, hypothyroidism, comes to the hospital with complaints of altered mental status and difficulty breathing. Found to have severe sepsis due to pneumonia/in the setting of COVID-19 with acute hypoxic respiratory failure.    Assessment / Plan / Recommendation Clinical Impression  Pt presents with signs concerning for a potential dysphagia. He has very hoarse brethy vocal quality, is contantly coughing and expectorating  clear secretions. When given sips of water multiple swallows and slightly delayed coughing observed. Belching and questionably wet vocal quality also noted throughout.  Nectar thick liquids managed with less coughing, but still some belching. Pt complained of "feeling like he needed to upchuck" and would not attempt solids.   Pt presents with a mixed picture with possibility of a baseline dysphagia. Will initiate a conservative diet of nectar thick clear liquids and f/u for tolerance and diet advancement. RN aware of precautions.  SLP Visit Diagnosis: Dysphagia, unspecified (R13.10)    Aspiration Risk  Moderate aspiration risk    Diet Recommendation Nectar-thick liquid   Liquid Administration via: Cup Medication Administration: Crushed with puree Supervision: Patient able to self feed Compensations: Slow rate;Small sips/bites Postural Changes: Seated upright at 90 degrees    Other  Recommendations Oral Care Recommendations: Oral care BID Other Recommendations: Have oral suction available   Follow up Recommendations Skilled Nursing facility      Frequency and Duration min 2x/week  2 weeks       Prognosis Prognosis for Safe Diet Advancement: Good      Swallow Study   General HPI: Randy Sullivan is a 85 y.o. male with medical history significant of dementia hypertension, hyperlipidemia, dementia, hypothyroidism, comes to the hospital with complaints of altered mental status and difficulty breathing. Found to have severe sepsis due to pneumonia/in the setting of COVID-19 with acute hypoxic respiratory failure.  Type of Study:  Bedside Swallow Evaluation Previous Swallow Assessment: was seen briefly at [facility], wife reported mild difficulty with solids, ordered dys 2/thin, no signs of aspiration at bedsie.  Diet Prior to this Study: Regular;Thin liquids Temperature Spikes Noted: No Respiratory Status: Room air History of Recent Intubation: No Behavior/Cognition: Alert;Cooperative;Pleasant  mood Oral Cavity - Dentition: Poor condition Vision: Functional for self-feeding Self-Feeding Abilities: Needs assist Patient Positioning: Upright in bed Baseline Vocal Quality: Low vocal intensity;Hoarse;Breathy Volitional Cough: Weak Volitional Swallow: Able to elicit    Oral/Motor/Sensory Function Overall Oral Motor/Sensory Function: Within functional limits   Ice Chips Ice chips: Not tested   Thin Liquid Thin Liquid: Impaired Presentation: Cup Pharyngeal  Phase Impairments: Cough - Immediate;Multiple swallows    Nectar Thick Nectar Thick Liquid: Impaired Pharyngeal Phase Impairments: Multiple swallows   Honey Thick Honey Thick Liquid: Not tested   Puree Puree: Impaired Pharyngeal Phase Impairments: Multiple swallows   Solid     Solid: Within functional limits     Herbie Baltimore, MA CCC-SLP  Acute Rehabilitation Services Pager [phone] Office [phone]  Lynann Beaver 07/14/2018,3:24 PM

## 2018-07-14 NOTE — Progress Notes (Addendum)
PROGRESS NOTE    COPELAND NEISEN  KWI:[id number] DOB: 10-13-1928 DOA: 07/01/2018 PCP: Prince Solian, MD      Brief Narrative:  Mr. Kohlmann is a 85 y.o. M with dementia, home-dwelling, HTN, hypothyroidism, OSA and recurrent syncope who presented with 3 days decreased mentation/responsiveness and also apparent dyspnea.  In ER, CXR showed RLL opacity, hypoxic requiring NRB and SARS-CoV-2 NAA was positive.  Given Actemra and admitted to [facility].      Assessment & Plan:  Coronavirus pneumonitis with acute hypoxic respiratory failure In setting of ongoing 2020 COVID-19 pandemic.  Given Actemra 5/21.    Weaned from 15L NRB to RA overnight, satting well. Ferritin >1000, CRP >24.   -VTE PPx with Lovenox, standard dose -Continue Zinc and Vitamin C -Daily d-dimer, ferritin and CRP  COVID-19 Labs Recent Labs    07/14/18 0445 07/14/18 0448  DDIMER  --  3.05*  FERRITIN 1,702*  --   LDH  --  193*  CRP 24.9*  --      Sepsis from RLL pneumonia Presented with tachycardia, fever, lactate >2.  Suspect superimposed RLL CAP.  Procal >20. -Continue ceftriaxone and azithromycin -Follow blood cultures -Check urine strep antigen  Acute metabolic encephalopathy From COVID.  CT head overnight unremarkable.  No focal deficits to suggest stroke.  Acute kidney injury Baseline 1.0 mg/dL, Cr 1.8 on admission.  Worsening to 2.0 mg/dL today. -Check urine electrolytes -Check renal US -Check urine protein  Brief atrial fibrillation CHA2DS2-VASc Score = 3 (age, HTN).  Has an implantable loop recorder.   ECG reviewed, showed Afib on admission. This was a very brief episode, I will not anticoagulate.  Has already converted. Will defer to Caridology in follow up. -Monitor on tele  Hypertension BP elevated overnight -Continue aspirin when able to take p.o. -Hold metoprolol for now -IV metoprolol PRN for severe range pressure  Dementia -Continue donepezil when able to take p.o.   Hypothyroidism -Convert levothyroxine to IV  Other -Continue mirabegron and Megace when able to take p.o. -Resume Inderal when able to take PO  Hyponatremia Mild  Anemia Mild, no clinical bleeding -Hold home iron  Coagulase negative staph positive blood culture Likely contaminant         MDM and disposition: The below labs and imaging reports were reviewed and summarized above.  Medication management as above.  The patient was admitted with acute renal failure and acute hypoxic respiratory failure from COVID.  We will continue work up of his renal failure, close monitoring of his O2 status.    The patient is admitted with: An acute illness (renal failure and COVID) that poses a threat to life, limb or bodily function An extensive number of diagnoses or treatment options were managed.       DVT prophylaxis: Lovenox Code Status: DO NOT RESUSCITATE Family Communication: Wife    Consultants:   None  Procedures:   Renal US  Antimicrobials:   Ceftriaoxne 5/21 >>  Azithromycin 5/21 >>    Subjective: Oriented to "hospital" and self.  Nothing else.  Mostly unable to follow commands normally, but alert to my presence.  No respiratory distress, weaned off O2.  Has sputum production.  Has weakness.  Fever overnight.  Objective: Vitals:   07/14/18 0828 07/14/18 0900 07/14/18 1000 07/14/18 1100  BP: (!) 165/107 (!) 141/103 (!) 175/94 (!) 166/111  Pulse: (!) 101 100 (!) 106 (!) 103  Resp: (!) 23 19 (!) 23 (!) 21  Temp: (!) 97.1 F (36.2  C)   97.7 F (36.5 C)  TempSrc: Axillary   Oral  SpO2: 96% 95% 95% 94%  Weight:      Height:        Intake/Output Summary (Last 24 hours) at 07/14/2018 1210 Last data filed at 07/14/2018 0600 Gross per 24 hour  Intake 1414.62 ml  Output 200 ml  Net 1214.62 ml   Filed Weights   06/30/2018 0950  Weight: 76.7 kg    Examination: General appearance:  adult male, awake but confused, in no immediate distress.   HEENT:  Anicteric, conjunctiva pink, lids and lashes normal. No nasal deformity, discharge, epistaxis.  Lips moist, dentition normal for age, OP tacky dry, no oral lesions, heraing reduced.   Skin: Warm and dry.  No jaundice.  POA pressure ulcers on right arm, buttock Cardiac: Tachycardic, regular, nl S1-S2, no murmurs appreciated.  Capillary refill is brisk.  JVP not visible.  No LE edema.  Radial pulses 2+ and symmetric. Respiratory: Normal respiratory rate and rhythm.  Diminished bilaterally Abdomen: Abdomen soft.  No TTP. No ascites, distension, hepatosplenomegaly.   MSK: No deformities or effusions. Neuro: Awake, but confused.  EOMI, strength equal in uppr and lower extremities. Speech slowed but fluent.    Psych: Awake, oriented to self, hospital only.  Attention diminihsed, psychomotor slowing is marked.  Affect flat.  Judgment and insight appear markedly impaired.    Data Reviewed: I have personally reviewed following labs and imaging studies:  CBC: Recent Labs  Lab 07/16/2018 0932 07/14/18 0448  WBC 4.2 9.9  NEUTROABS 2.8 8.6*  HGB 12.0* 11.5*  HCT 37.6* 33.9*  MCV 97.7 96.3  PLT 167 517*   Basic Metabolic Panel: Recent Labs  Lab 07/01/2018 0932 07/14/18 0448 07/14/18 0500  NA 133*  --  135  K 3.8  --  4.3  CL 101  --  107  CO2 20*  --  16*  GLUCOSE 96  --  130*  BUN 28*  --  38*  CREATININE 1.88*  --  2.01*  CALCIUM 8.8*  --  8.2*  MG  --  1.8  --   PHOS  --  5.1*  --    GFR: Estimated Creatinine Clearance: 27.1 mL/min (A) (by C-G formula based on SCr of 2.01 mg/dL (H)). Liver Function Tests: Recent Labs  Lab 06/24/2018 0932  AST 31  ALT 27  ALKPHOS 62  BILITOT 0.6  PROT 7.1  ALBUMIN 3.7   No results for input(s): LIPASE, AMYLASE in the last 168 hours. No results for input(s): AMMONIA in the last 168 hours. Coagulation Profile: No results for input(s): INR, PROTIME in the last 168 hours. Cardiac Enzymes: No results for input(s): CKTOTAL, CKMB, CKMBINDEX,  TROPONINI in the last 168 hours. BNP (last 3 results) No results for input(s): PROBNP in the last 8760 hours. HbA1C: No results for input(s): HGBA1C in the last 72 hours. CBG: No results for input(s): GLUCAP in the last 168 hours. Lipid Profile: No results for input(s): CHOL, HDL, LDLCALC, TRIG, CHOLHDL, LDLDIRECT in the last 72 hours. Thyroid Function Tests: No results for input(s): TSH, T4TOTAL, FREET4, T3FREE, THYROIDAB in the last 72 hours. Anemia Panel: Recent Labs    07/14/18 0445  FERRITIN 1,702*   Urine analysis:    Component Value Date/Time   COLORURINE YELLOW 07/12/2018 0932   APPEARANCEUR CLEAR 07/17/2018 0932   LABSPEC 1.016 07/22/2018 0932   PHURINE 5.0 07/09/2018 0932   GLUCOSEU NEGATIVE 07/08/2018 0932   HGBUR NEGATIVE 07/10/2018 0932  BILIRUBINUR NEGATIVE 07/03/2018 0932   KETONESUR NEGATIVE 06/25/2018 0932   PROTEINUR 30 (A) 06/28/2018 0932   NITRITE NEGATIVE 07/21/2018 0932   LEUKOCYTESUR NEGATIVE 06/30/2018 0932   Sepsis Labs: @LABRCNTIP (procalcitonin:4,lacticacidven:4)  ) Recent Results (from the past 240 hour(s))  Blood Culture (routine x 2)     Status: None (Preliminary result)   Collection Time: 07/20/2018  9:32 AM  Result Value Ref Range Status   Specimen Description   Final    BLOOD RIGHT ANTECUBITAL Performed at [facility], [place] [address] [place], [place] [address]    Special Requests   Final    BOTTLES DRAWN AEROBIC AND ANAEROBIC Blood Culture adequate volume Performed at [facility] [address], [place], [place] [address]    Culture  Setup Time   Final    GRAM POSITIVE COCCI IN CLUSTERS ANAEROBIC BOTTLE ONLY Organism ID to follow CRITICAL RESULT CALLED TO, READ BACK BY AND VERIFIED WITH: Jene Every PharmD 12:05 07/14/18 ([email or web address]) Performed at [facility], [address], [place], [place] [address]    Culture GRAM POSITIVE COCCI  Final   Report Status PENDING  Incomplete  Blood Culture (routine x 2)      Status: None (Preliminary result)   Collection Time: 06/24/2018  9:32 AM  Result Value Ref Range Status   Specimen Description   Final    BLOOD RIGHT ARM Performed at [facility] [address], [place], [place] [address]    Special Requests   Final    BOTTLES DRAWN AEROBIC AND ANAEROBIC Blood Culture adequate volume Performed at [facility] [address], [place], [place] [address]    Culture   Final    NO GROWTH 1 DAY Performed at [facility], [place] [address] [place], [place] [address]    Report Status PENDING  Incomplete  SARS Coronavirus 2 (CEPHEID- Performed in [facility] hospital lab), Hosp Order     Status: Abnormal   Collection Time: 07/12/2018  9:32 AM  Result Value Ref Range Status   SARS Coronavirus 2 POSITIVE (A) NEGATIVE Final    Comment: RESULT CALLED TO, READ BACK BY AND VERIFIED WITH: S.BINGHAM RN AT 1124 ON 07/15/2018 BY S.VANHOORNE (NOTE) If result is NEGATIVE SARS-CoV-2 target nucleic acids are NOT DETECTED. The SARS-CoV-2 RNA is generally detectable in upper and lower  respiratory specimens during the acute phase of infection. The lowest  concentration of SARS-CoV-2 viral copies this assay can detect is 250  copies / mL. A negative result does not preclude SARS-CoV-2 infection  and should not be used as the sole basis for treatment or other  patient management decisions.  A negative result may occur with  improper specimen collection / handling, submission of specimen other  than nasopharyngeal swab, presence of viral mutation(s) within the  areas targeted by this assay, and inadequate number of viral copies  (<250 copies / mL). A negative result must be combined with clinical  observations, patient history, and epidemiological information. If result is POSITIVE SARS-CoV-2 target nucleic acids are  DETECTED. The SARS-CoV-2 RNA is generally detectable in upper and lower  respiratory specimens during the acute  phase of infection.  Positive  results are indicative of active infection with SARS-CoV-2.  Clinical  correlation with patient history and other diagnostic information is  necessary to determine patient infection status.  Positive results do  not rule out bacterial infection or co-infection with other viruses. If result is PRESUMPTIVE  POSTIVE SARS-CoV-2 nucleic acids MAY BE PRESENT.   A presumptive positive result was obtained on the submitted specimen  and confirmed on repeat testing.  While 2019 novel coronavirus  (SARS-CoV-2) nucleic acids may be present in the submitted sample  additional confirmatory testing may be necessary for epidemiological  and / or clinical management purposes  to differentiate between  SARS-CoV-2 and other Sarbecovirus currently known to infect humans.  If clinically indicated additional testing with an alternate test  methodology  [phone]) is advised. The SARS-CoV-2 RNA is generally  detectable in upper and lower respiratory specimens during the acute  phase of infection. The expected result is Negative. Fact Sheet for Patients:  StrictlyIdeas.no Fact Sheet for Healthcare Providers: BankingDealers.co.za This test is not yet approved or cleared by the [place] FDA and has been authorized for detection and/or diagnosis of SARS-CoV-2 by FDA under an Emergency Use Authorization (EUA).  This EUA will remain in effect (meaning this test can be used) for the duration of the COVID-19 declaration under Section 564(b)(1) of the Act, 21 U.S.C. section 360bbb-3(b)(1), unless the authorization is terminated or revoked sooner. Performed at [facility], [place] [address], [place], [place] [address]   Urine culture     Status: None   Collection Time: 07/10/2018  9:32 AM  Result Value Ref Range Status   Specimen Description   Final    URINE, CLEAN CATCH Performed at [facility],  [place] [address], [place], [place] [address]    Special Requests   Final    NONE Performed at [facility], [place] [address], [place], [place] [address]    Culture   Final    NO GROWTH Performed at [facility], [place] [address], [place], [place] [address]    Report Status 07/14/2018 FINAL  Final  Blood Culture ID Panel (Reflexed)     Status: Abnormal   Collection Time: 07/20/2018  9:32 AM  Result Value Ref Range Status   Enterococcus species NOT DETECTED NOT DETECTED Final   Listeria monocytogenes NOT DETECTED NOT DETECTED Final   Staphylococcus species DETECTED (A) NOT DETECTED Final    Comment: Methicillin (oxacillin) resistant coagulase negative staphylococcus. Possible blood culture contaminant (unless isolated from more than one blood culture draw or clinical case suggests pathogenicity). No antibiotic treatment is indicated for blood  culture contaminants. CRITICAL RESULT CALLED TO, READ BACK BY AND VERIFIED WITH: Jene Every PharmD 12:05 07/14/18 ([email or web address])    Staphylococcus aureus (BCID) NOT DETECTED NOT DETECTED Final   Methicillin resistance DETECTED (A) NOT DETECTED Final    Comment: CRITICAL RESULT CALLED TO, READ BACK BY AND VERIFIED WITH: Jene Every PharmD 12:05 07/14/18 ([email or web address])    Streptococcus species NOT DETECTED NOT DETECTED Final   Streptococcus agalactiae NOT DETECTED NOT DETECTED Final   Streptococcus pneumoniae NOT DETECTED NOT DETECTED Final   Streptococcus pyogenes NOT DETECTED NOT DETECTED Final   Acinetobacter baumannii NOT DETECTED NOT DETECTED Final   Enterobacteriaceae species NOT DETECTED NOT DETECTED Final   Enterobacter cloacae complex NOT DETECTED NOT DETECTED Final   Escherichia coli NOT DETECTED NOT DETECTED Final   Klebsiella oxytoca NOT DETECTED NOT DETECTED Final   Klebsiella pneumoniae NOT DETECTED NOT DETECTED Final   Proteus species NOT DETECTED NOT DETECTED Final   Serratia marcescens NOT DETECTED NOT DETECTED Final    Haemophilus influenzae NOT DETECTED NOT DETECTED Final   Neisseria meningitidis NOT DETECTED NOT DETECTED Final   Pseudomonas aeruginosa NOT DETECTED NOT DETECTED  Final   Candida albicans NOT DETECTED NOT DETECTED Final   Candida glabrata NOT DETECTED NOT DETECTED Final   Candida krusei NOT DETECTED NOT DETECTED Final   Candida parapsilosis NOT DETECTED NOT DETECTED Final   Candida tropicalis NOT DETECTED NOT DETECTED Final    Comment: Performed at [facility], [place] [address], [place], [place] [address]         Radiology Studies: Ct Head Wo Contrast  Result Date: 07/14/2018 CLINICAL DATA:  Altered level of consciousness, evaluate for intracranial hemorrhage EXAM: CT HEAD WITHOUT CONTRAST TECHNIQUE: Contiguous axial images were obtained from the base of the skull through the vertex without intravenous contrast. COMPARISON:  06/15/2018 FINDINGS: Brain: Remote right thalamic lacunar infarct. Small remote right cerebellar infarct. Chronic small vessel ischemic gliosis in the cerebral white matter. Small remote right parietal cortex infarct at the vertex. There is cerebral volume loss with congruent ventriculomegaly. No collection, acute infarct, hemorrhage, or mass lesion. Vascular: Negative Skull: Negative Sinuses/Orbits: Mucosal thickening in the left sphenoid sinus, chronic. IMPRESSION: 1. No acute finding. 2. Chronic small vessel disease with small remote infarcts as described. Electronically Signed   By: Monte Fantasia M.D.   On: 07/14/2018 06:33   Dg Chest Port 1 View  Result Date: 07/12/2018 CLINICAL DATA:  Shortness of breath. EXAM: PORTABLE CHEST 1 VIEW COMPARISON:  03/25/2018 FINDINGS: The cardiac silhouette is normal in size. An implanted loop recorder and aortic atherosclerosis are noted. Lung volumes are low with new right perihilar and right basilar opacity. The left lung is clear. No sizable pleural effusion or pneumothorax is identified. Right glenohumeral arthritis is  noted. IMPRESSION: Low lung volumes with right perihilar and basilar opacity compatible with pneumonia. Electronically Signed   By: Logan Bores M.D.   On: 07/11/2018 10:42        Scheduled Meds: . acidophilus  1 capsule Oral Daily  . aspirin  81 mg Oral Daily  . donepezil  10 mg Oral Daily  . enoxaparin (LOVENOX) injection  30 mg Subcutaneous Daily  . [START ON 07/15/2018] levothyroxine  50 mcg Intravenous QAC breakfast  . megestrol  200 mg Oral Daily  . methylPREDNISolone (SOLU-MEDROL) injection  40 mg Intravenous Q12H  . mirabegron ER  50 mg Oral QHS  . vitamin C  500 mg Oral Daily  . zinc sulfate  220 mg Oral Daily   Continuous Infusions: . sodium chloride 1,000 mL (07/14/18 0835)  . azithromycin 500 mg (07/14/18 1112)  . cefTRIAXone (ROCEPHIN)  IV 2 g (07/14/18 0838)     LOS: 1 day    Time spent: 35 minutes  During this encounter: Patient Isolation: Airborne, contact, droplet HCP PPE: Face shield, head covering, N95, gown, gloves, and shoe covers    Edwin Dada, MD [facility] 07/14/2018, 12:10 PM     Please page through [facility]:  [email or web address] Password [email or web address] If 7PM-7AM, please contact night-coverage

## 2018-07-14 NOTE — Progress Notes (Signed)
Ct results reviewed

## 2018-07-14 NOTE — Progress Notes (Signed)
PHARMACY - PHYSICIAN COMMUNICATION CRITICAL VALUE ALERT - BLOOD CULTURE IDENTIFICATION (BCID)  Randy Sullivan is an 85 y.o. male who presented to [facility] on 07/17/2018 with a chief complaint of COVID  Assessment: Pt present from home for AMS and positive for COVID. Lab called with the BCID result of 1/4 bottles came back positive for staph species that is likely to be CNS. This is likely to be a contaminant. Will let MD aware  Name of physician (or Provider) Contacted: Dr Loleta Books  Current antibiotics: Ceftriaxone/azith  Changes to prescribed antibiotics recommended:  No change  Results for orders placed or performed during the hospital encounter of 07/14/2018  Blood Culture ID Panel (Reflexed) (Collected: 06/28/2018  9:32 AM)  Result Value Ref Range   Enterococcus species NOT DETECTED NOT DETECTED   Listeria monocytogenes NOT DETECTED NOT DETECTED   Staphylococcus species DETECTED (A) NOT DETECTED   Staphylococcus aureus (BCID) NOT DETECTED NOT DETECTED   Methicillin resistance DETECTED (A) NOT DETECTED   Streptococcus species NOT DETECTED NOT DETECTED   Streptococcus agalactiae NOT DETECTED NOT DETECTED   Streptococcus pneumoniae NOT DETECTED NOT DETECTED   Streptococcus pyogenes NOT DETECTED NOT DETECTED   Acinetobacter baumannii NOT DETECTED NOT DETECTED   Enterobacteriaceae species NOT DETECTED NOT DETECTED   Enterobacter cloacae complex NOT DETECTED NOT DETECTED   Escherichia coli NOT DETECTED NOT DETECTED   Klebsiella oxytoca NOT DETECTED NOT DETECTED   Klebsiella pneumoniae NOT DETECTED NOT DETECTED   Proteus species NOT DETECTED NOT DETECTED   Serratia marcescens NOT DETECTED NOT DETECTED   Haemophilus influenzae NOT DETECTED NOT DETECTED   Neisseria meningitidis NOT DETECTED NOT DETECTED   Pseudomonas aeruginosa NOT DETECTED NOT DETECTED   Candida albicans NOT DETECTED NOT DETECTED   Candida glabrata NOT DETECTED NOT DETECTED   Candida krusei NOT DETECTED NOT DETECTED    Candida parapsilosis NOT DETECTED NOT DETECTED   Candida tropicalis NOT DETECTED NOT DETECTED    Onnie Boer, PharmD, BCIDP, AAHIVP, CPP Infectious Disease Pharmacist 07/14/2018 12:14 PM

## 2018-07-15 ENCOUNTER — Inpatient Hospital Stay (HOSPITAL_COMMUNITY): Payer: MEDICARE

## 2018-07-15 DIAGNOSIS — A419 Sepsis, unspecified organism: Secondary | ICD-10-CM

## 2018-07-15 DIAGNOSIS — J9600 Acute respiratory failure, unspecified whether with hypoxia or hypercapnia: Secondary | ICD-10-CM

## 2018-07-15 DIAGNOSIS — R6521 Severe sepsis with septic shock: Secondary | ICD-10-CM

## 2018-07-15 LAB — CBC WITH DIFFERENTIAL/PLATELET
Abs Immature Granulocytes: 0.06 10*3/uL (ref 0.00–0.07)
Basophils Absolute: 0 10*3/uL (ref 0.0–0.1)
Basophils Relative: 0 %
Eosinophils Absolute: 0 10*3/uL (ref 0.0–0.5)
Eosinophils Relative: 0 %
HCT: 41.8 % (ref 39.0–52.0)
Hemoglobin: 13.2 g/dL (ref 13.0–17.0)
Immature Granulocytes: 1 %
Lymphocytes Relative: 5 %
Lymphs Abs: 0.6 10*3/uL — ABNORMAL LOW (ref 0.7–4.0)
MCH: 31.8 pg (ref 26.0–34.0)
MCHC: 31.6 g/dL (ref 30.0–36.0)
MCV: 100.7 fL — ABNORMAL HIGH (ref 80.0–100.0)
Monocytes Absolute: 0.4 10*3/uL (ref 0.1–1.0)
Monocytes Relative: 3 %
Neutro Abs: 10.8 10*3/uL — ABNORMAL HIGH (ref 1.7–7.7)
Neutrophils Relative %: 91 %
Platelets: 196 10*3/uL (ref 150–400)
RBC: 4.15 MIL/uL — ABNORMAL LOW (ref 4.22–5.81)
RDW: 14.2 % (ref 11.5–15.5)
WBC Morphology: INCREASED
WBC: 11.9 10*3/uL — ABNORMAL HIGH (ref 4.0–10.5)
nRBC: 0 % (ref 0.0–0.2)

## 2018-07-15 LAB — BASIC METABOLIC PANEL
Anion gap: 15 (ref 5–15)
BUN: 55 mg/dL — ABNORMAL HIGH (ref 8–23)
CO2: 17 mmol/L — ABNORMAL LOW (ref 22–32)
Calcium: 8.4 mg/dL — ABNORMAL LOW (ref 8.9–10.3)
Chloride: 107 mmol/L (ref 98–111)
Creatinine, Ser: 2.61 mg/dL — ABNORMAL HIGH (ref 0.61–1.24)
GFR calc Af Amer: 24 mL/min — ABNORMAL LOW (ref >60)
GFR calc non Af Amer: 21 mL/min — ABNORMAL LOW (ref >60)
Glucose, Bld: 133 mg/dL — ABNORMAL HIGH (ref 70–99)
Potassium: 6.3 mmol/L (ref 3.5–5.1)
Sodium: 139 mmol/L (ref 135–145)

## 2018-07-15 LAB — C-REACTIVE PROTEIN: CRP: 17.7 mg/dL — ABNORMAL HIGH (ref <1.0)

## 2018-07-15 LAB — FERRITIN: Ferritin: >7500 ng/mL — ABNORMAL HIGH (ref 24–336)

## 2018-07-15 LAB — MAGNESIUM: Magnesium: 2.3 mg/dL (ref 1.7–2.4)

## 2018-07-15 LAB — LACTIC ACID, PLASMA
Lactic Acid, Venous: 5.7 mmol/L (ref 0.5–1.9)
Lactic Acid, Venous: 5 mmol/L (ref 0.5–1.9)

## 2018-07-15 LAB — D-DIMER, QUANTITATIVE: D-Dimer, Quant: 4.34 ug/mL-FEU — ABNORMAL HIGH (ref 0.00–0.50)

## 2018-07-15 LAB — POTASSIUM: Potassium: 5.9 mmol/L — ABNORMAL HIGH (ref 3.5–5.1)

## 2018-07-15 LAB — BRAIN NATRIURETIC PEPTIDE: B Natriuretic Peptide: 1029.8 pg/mL — ABNORMAL HIGH (ref 0.0–100.0)

## 2018-07-15 LAB — STREP PNEUMONIAE URINARY ANTIGEN: Strep Pneumo Urinary Antigen: NEGATIVE

## 2018-07-15 LAB — HEPATITIS B SURFACE ANTIGEN: Hepatitis B Surface Ag: NEGATIVE

## 2018-07-15 MED ORDER — SODIUM ZIRCONIUM CYCLOSILICATE 10 G PO PACK
10.0000 g | PACK | Freq: Once | ORAL | Status: AC
  Administered 2018-07-15: 10 g via ORAL
  Filled 2018-07-15: qty 1

## 2018-07-15 MED ORDER — METHYLPREDNISOLONE SODIUM SUCC 40 MG IJ SOLR
40.0000 mg | Freq: Two times a day (BID) | INTRAMUSCULAR | Status: AC
  Administered 2018-07-15 – 2018-07-16 (×2): 40 mg via INTRAVENOUS
  Filled 2018-07-15 (×2): qty 1

## 2018-07-15 MED ORDER — SODIUM CHLORIDE 0.9 % IV SOLN
INTRAVENOUS | Status: DC
  Administered 2018-07-15 – 2018-07-16 (×3): via INTRAVENOUS

## 2018-07-15 MED ORDER — SODIUM CHLORIDE 0.9 % IV BOLUS
1000.0000 mL | Freq: Once | INTRAVENOUS | Status: AC
  Administered 2018-07-15: 1000 mL via INTRAVENOUS

## 2018-07-15 NOTE — Progress Notes (Signed)
SLP Cancellation Note  Patient Details Name: Randy Sullivan MRN: [id number] DOB: 1928-05-11   Cancelled treatment:       Reason Eval/Treat Not Completed: Medical issues which prohibited therapy. Pt on non rebreather, respiratory function worsening per notes. PO on hold. Will follow for needs   Kahmari Herard, Katherene Ponto 07/15/2018, 12:17 PM

## 2018-07-15 NOTE — Progress Notes (Signed)
CRITICAL VALUE ALERT  Critical Value:  Lactic Acid 5.0  Date & Time Notied:  07/15/18  12:44  Provider Notified: Dr. Loleta Books   Orders Received/Actions taken: will await orders from provider.

## 2018-07-15 NOTE — Progress Notes (Signed)
CRITICAL VALUE ALERT  Critical Value:  Lactic Acid 5.7  Date & Time Notied:  07/15/18 at 0740  Provider Notified: result taken by this nurse (charge), notified patient's day shift nurse, Jenny Reichmann RN, who will contact the physician  Orders Received/Actions taken: awaiting

## 2018-07-15 NOTE — Care Management (Signed)
Tech bladder scanned pt at 0407 with results of 266ml. Dr. Jackquline Bosch notified at [phone]. No new orders received. MD wants pt bladder scanned again in "3-4 hours". Will continue to monitor

## 2018-07-15 NOTE — Care Management (Signed)
Notified Dr. Shanon Brow in regards to patients respirations between 36-40, and lungs sound "wet" upon auscultation by RN and RT. Order received for Lasix 20mg  IV x 1 dose. Will administer when ready by pharmacy and continue to monitor.

## 2018-07-15 NOTE — Care Management (Signed)
Bladder scanned pt. 159ml urine noted.Upon palpation, bladder not distended. Will continue to monitor.

## 2018-07-15 NOTE — Progress Notes (Signed)
PROGRESS NOTE    Randy Sullivan  HUT:[id number] DOB: 1928-06-22 DOA: 07/21/2018 PCP: Prince Solian, MD      Brief Narrative:  Randy Sullivan is a 85 y.o. M with dementia, home-dwelling, HTN, hypothyroidism, OSA and recurrent syncope who presented with 3 days decreased mentation/responsiveness and also apparent dyspnea.  In ER, CXR showed RLL opacity, hypoxic requiring NRB and SARS-CoV-2 NAA was positive.  Given Actemra and admitted to [facility].      Assessment & Plan:  Acute kidney injury Hyperkalemia Hyperkalemia new.  Overnight, creatinine worsening to 2.6 mg/dL this AM after Lasix last night.    Baseline 1.0 mg/dL, Cr 1.8 on admission.    Renal US shows chronic disease, no hydronephrosis.  FeNA shows pre-renal disease.  ECG personally reviewed, shows no peaked T or QRS changes. -Lokelma ordered -IV fluids ordered -Repeat K in 4 hours   Coronavirus pneumonitis with acute hypoxic respiratory failure In setting of ongoing 2020 COVID-19 pandemic.  Given Actemra 5/21.  Hep B SAg negative.  Worsening now.  Chest x-ray with worsening bilateral opacities this morning.  Ferritin >10000 now, CRP >15.   -Continue Supplemental O2 -Continue Solu-medrol -VTE PPx with Lovenox, standard dose -Continue Zinc and Vitamin C -Daily d-dimer, ferritin and CRP     Sepsis from RLL pneumonia Presented with tachycardia, fever, lactate >2.  Suspect superimposed RLL CAP.  Procal >20.  Overnight, patient had rales, these were interpreted as edema (ambiguous to nursing, ON MD if these were from pneumonia), Lasix given.  Today, lactate increased to >5.  Blood cultures with CoNS in 1/4, suspect contaminant.  Strep urine antigen negative. -Continue ceftriaxone and azithromycin -Repeat fluid bolus -Hold diuretics -Trend lactic acid -HFNC   Acute metabolic encephalopathy From COVID.  CT head unremarkable.  No focal deficits to suggest stroke.  Worsening.  Brief atrial fibrillation  CHA2DS2-VASc Score = 3 (age, HTN).  Has an implantable loop recorder.   ECG reviewed, showed Afib on admission. This was a very brief episode, I will not anticoagulate.  Has already converted. Will defer to Caridology in follow up. -Monitor on tele  Hypertension BP elevated -Continue aspirin -Continue metoprolol   -IV metoprolol PRN for severe range pressure  Dementia -Continue donepezil  Hypothyroidism -Continue levothyroxine  Hyponatremia Mild  Anemia Mild, no clinical bleeding -Hold home iron  Coagulase negative staph positive blood culture Likely contaminant         MDM and disposition: The below labs and imaging reports reviewed and summarized above.  Medication management as above.  ECG personally reviewed and summarized above.  Chest x-ray personally reviewed.  The patient was admitted with acute renal failure and acute hypoxic respiratory failure from COVID.  His pneumonia and respiratory failure as well as acute renal failure both worsening.  The patient is critically ill with multi-organ failure.  Critical care was necessary to treat or prevent imminent or life-threatening deterioration of sepsis, respiratory failure, renal failure and was exclusive of separately billable procedures and treating other patients. Total critical care time spent by me: 50 minutes Time spent personally by me on obtaining history from patient or surrogate, evaluation of the patient, evaluation of patient's response to treatment, ordering and review of laboratory studies, ordering and review of radiographic studies, ordering and performing treatments and interventions, and re-evaluation of the patient's condition.       DVT prophylaxis: Lovenox Code Status: DO NOT RESUSCITATE Family Communication: Wife    Consultants:   None  Procedures:   Renal  US  Antimicrobials:   Ceftriaoxne 5/21 >>  Azithromycin 5/21 >>    Subjective: Less responisve today.  Tachypneic.   Overnight was very tachypneic, in respiratory distress, had urinary retention and rash around penis.  Some hemopytsis.         Objective: Vitals:   07/14/18 2336 07/15/18 0300 07/15/18 0350 07/15/18 0400  BP: (!) 133/93 (!) 135/102 109/82 101/69  Pulse: 100     Resp: (!) 36  (!) 40 20  Temp: (!) 96.5 F (35.8 C)  (!) 96.8 F (36 C)   TempSrc: Axillary  Axillary   SpO2: 99%     Weight:      Height:        Intake/Output Summary (Last 24 hours) at 07/15/2018 0801 Last data filed at 07/14/2018 1800 Gross per 24 hour  Intake 1741.03 ml  Output -  Net 1741.03 ml   Filed Weights   07/01/2018 0950  Weight: 76.7 kg    Examination: General appearance: Frail elderly adult male.  Lethargic. HEENT: Anicteric, conjunctival pink, lids and lashes atrophic.  No nasal deformity, discharge, or epistaxis.  Lips dry, dentition normal.  Oropharynx very dry, no oral lesions, hearing unable to assess. Skin: Warm and dry.  No jaundice.  POA pressure ulcers on right arm, buttock Cardiac: Tachycardic, irregular, no murmurs, no lower extremity edema, JVP normal. Respiratory: Tachypneic, rales bilaterally, diminished throughout. Abdomen: Abdomen without tenderness to palpation or guarding.  No distention or hepatosplenomegaly. MSK: Normal muscle bulk and tone for age. Neuro: Awake, but lethargic, weak, does not follow commands.    Psych: Unable to assess.      Data Reviewed: I have personally reviewed following labs and imaging studies:  CBC: Recent Labs  Lab 07/23/2018 0932 07/14/18 0448 07/15/18 0447  WBC 4.2 9.9 11.9*  NEUTROABS 2.8 8.6* 10.8*  HGB 12.0* 11.5* 13.2  HCT 37.6* 33.9* 41.8  MCV 97.7 96.3 100.7*  PLT 167 140* 751   Basic Metabolic Panel: Recent Labs  Lab 07/07/2018 0932 07/14/18 0448 07/14/18 0500 07/15/18 0447  NA 133*  --  135 139  K 3.8  --  4.3 6.3*  CL 101  --  107 107  CO2 20*  --  16* 17*  GLUCOSE 96  --  130* 133*  BUN 28*  --  38* 55*  CREATININE 1.88*   --  2.01* 2.61*  CALCIUM 8.8*  --  8.2* 8.4*  MG  --  1.8  --  2.3  PHOS  --  5.1*  --   --    GFR: Estimated Creatinine Clearance: 20.8 mL/min (A) (by C-G formula based on SCr of 2.61 mg/dL (H)). Liver Function Tests: Recent Labs  Lab 07/03/2018 0932  AST 31  ALT 27  ALKPHOS 62  BILITOT 0.6  PROT 7.1  ALBUMIN 3.7   No results for input(s): LIPASE, AMYLASE in the last 168 hours. No results for input(s): AMMONIA in the last 168 hours. Coagulation Profile: No results for input(s): INR, PROTIME in the last 168 hours. Cardiac Enzymes: No results for input(s): CKTOTAL, CKMB, CKMBINDEX, TROPONINI in the last 168 hours. BNP (last 3 results) No results for input(s): PROBNP in the last 8760 hours. HbA1C: No results for input(s): HGBA1C in the last 72 hours. CBG: No results for input(s): GLUCAP in the last 168 hours. Lipid Profile: No results for input(s): CHOL, HDL, LDLCALC, TRIG, CHOLHDL, LDLDIRECT in the last 72 hours. Thyroid Function Tests: No results for input(s): TSH, T4TOTAL, FREET4, T3FREE, THYROIDAB  in the last 72 hours. Anemia Panel: Recent Labs    07/14/18 0445  FERRITIN 1,702*   Urine analysis:    Component Value Date/Time   COLORURINE YELLOW 06/25/2018 0932   APPEARANCEUR CLEAR 07/19/2018 0932   LABSPEC 1.016 07/19/2018 0932   PHURINE 5.0 07/07/2018 0932   GLUCOSEU NEGATIVE 07/03/2018 0932   HGBUR NEGATIVE 07/17/2018 0932   BILIRUBINUR NEGATIVE 07/12/2018 0932   KETONESUR NEGATIVE 07/12/2018 0932   PROTEINUR 30 (A) 07/17/2018 0932   NITRITE NEGATIVE 07/23/2018 0932   LEUKOCYTESUR NEGATIVE 07/03/2018 0932   Sepsis Labs: @LABRCNTIP (procalcitonin:4,lacticacidven:4)  ) Recent Results (from the past 240 hour(s))  Blood Culture (routine x 2)     Status: None (Preliminary result)   Collection Time: 06/27/2018  9:32 AM  Result Value Ref Range Status   Specimen Description   Final    BLOOD RIGHT ANTECUBITAL Performed at [facility], [place] [address],  [place], [place] [address]    Special Requests   Final    BOTTLES DRAWN AEROBIC AND ANAEROBIC Blood Culture adequate volume Performed at [facility] [address] [place], [place] [address]    Culture  Setup Time   Final    GRAM POSITIVE COCCI IN CLUSTERS ANAEROBIC BOTTLE ONLY Organism ID to follow CRITICAL RESULT CALLED TO, READ BACK BY AND VERIFIED WITH: Jene Every PharmD 12:05 07/14/18 ([email or web address]) Performed at [facility], [address] [place], [place] [address]    Culture GRAM POSITIVE COCCI  Final   Report Status PENDING  Incomplete  Blood Culture (routine x 2)     Status: None (Preliminary result)   Collection Time: 07/12/2018  9:32 AM  Result Value Ref Range Status   Specimen Description   Final    BLOOD RIGHT ARM Performed at [facility] [address], [place], [place] [address]    Special Requests   Final    BOTTLES DRAWN AEROBIC AND ANAEROBIC Blood Culture adequate volume Performed at [facility] [address], [place], [place] [address]    Culture   Final    NO GROWTH 1 DAY Performed at [facility], [place] [address], [place], [place] [address]    Report Status PENDING  Incomplete  SARS Coronavirus 2 (CEPHEID- Performed in [place] hospital lab), Hosp Order     Status: Abnormal   Collection Time: 06/25/2018  9:32 AM  Result Value Ref Range Status   SARS Coronavirus 2 POSITIVE (A) NEGATIVE Final    Comment: RESULT CALLED TO, READ BACK BY AND VERIFIED WITH: S.BINGHAM RN AT 1124 ON 07/04/2018 BY S.VANHOORNE (NOTE) If result is NEGATIVE SARS-CoV-2 target nucleic acids are NOT DETECTED. The SARS-CoV-2 RNA is generally detectable in upper and lower  respiratory specimens during the acute phase of infection. The lowest  concentration of SARS-CoV-2 viral copies this assay can detect is 250  copies / mL. A negative result does not preclude SARS-CoV-2 infection  and should not be used as the sole basis for  treatment or other  patient management decisions.  A negative result may occur with  improper specimen collection / handling, submission of specimen other  than nasopharyngeal swab, presence of viral mutation(s) within the  areas targeted by this assay, and inadequate number of viral copies  (<250 copies / mL). A negative result must be combined with clinical  observations, patient history, and epidemiological information. If result is POSITIVE SARS-CoV-2 target nucleic acids are  DETECTED. The SARS-CoV-2 RNA  is generally detectable in upper and lower  respiratory specimens during the acute phase of infection.  Positive  results are indicative of active infection with SARS-CoV-2.  Clinical  correlation with patient history and other diagnostic information is  necessary to determine patient infection status.  Positive results do  not rule out bacterial infection or co-infection with other viruses. If result is PRESUMPTIVE POSTIVE SARS-CoV-2 nucleic acids MAY BE PRESENT.   A presumptive positive result was obtained on the submitted specimen  and confirmed on repeat testing.  While 2019 novel coronavirus  (SARS-CoV-2) nucleic acids may be present in the submitted sample  additional confirmatory testing may be necessary for epidemiological  and / or clinical management purposes  to differentiate between  SARS-CoV-2 and other Sarbecovirus currently known to infect humans.  If clinically indicated additional testing with an alternate test  methodology  [phone]) is advised. The SARS-CoV-2 RNA is generally  detectable in upper and lower respiratory specimens during the acute  phase of infection. The expected result is Negative. Fact Sheet for Patients:  StrictlyIdeas.no Fact Sheet for Healthcare Providers: BankingDealers.co.za This test is not yet approved or cleared by the [place] FDA and has been authorized for detection and/or  diagnosis of SARS-CoV-2 by FDA under an Emergency Use Authorization (EUA).  This EUA will remain in effect (meaning this test can be used) for the duration of the COVID-19 declaration under Section 564(b)(1) of the Act, 21 U.S.C. section 360bbb-3(b)(1), unless the authorization is terminated or revoked sooner. Performed at [facility], [place] [address] [place], [place] [address]   Urine culture     Status: None   Collection Time: 07/03/2018  9:32 AM  Result Value Ref Range Status   Specimen Description   Final    URINE, CLEAN CATCH Performed at [facility], [place] [address] [place], [place] [address]    Special Requests   Final    NONE Performed at [facility], [place] [address], [place], [place] [address]    Culture   Final    NO GROWTH Performed at [facility], [place] [address] [place], [place] [address]    Report Status 07/14/2018 FINAL  Final  Blood Culture ID Panel (Reflexed)     Status: Abnormal   Collection Time: 06/26/2018  9:32 AM  Result Value Ref Range Status   Enterococcus species NOT DETECTED NOT DETECTED Final   Listeria monocytogenes NOT DETECTED NOT DETECTED Final   Staphylococcus species DETECTED (A) NOT DETECTED Final    Comment: Methicillin (oxacillin) resistant coagulase negative staphylococcus. Possible blood culture contaminant (unless isolated from more than one blood culture draw or clinical case suggests pathogenicity). No antibiotic treatment is indicated for blood  culture contaminants. CRITICAL RESULT CALLED TO, READ BACK BY AND VERIFIED WITH: Jene Every PharmD 12:05 07/14/18 ([email or web address])    Staphylococcus aureus (BCID) NOT DETECTED NOT DETECTED Final   Methicillin resistance DETECTED (A) NOT DETECTED Final    Comment: CRITICAL RESULT CALLED TO, READ BACK BY AND VERIFIED WITH: Jene Every PharmD 12:05 07/14/18 ([email or web address])    Streptococcus species NOT DETECTED NOT DETECTED Final   Streptococcus agalactiae  NOT DETECTED NOT DETECTED Final   Streptococcus pneumoniae NOT DETECTED NOT DETECTED Final   Streptococcus pyogenes NOT DETECTED NOT DETECTED Final   Acinetobacter baumannii NOT DETECTED NOT DETECTED Final   Enterobacteriaceae species NOT DETECTED NOT DETECTED Final   Enterobacter cloacae complex NOT DETECTED NOT DETECTED Final   Escherichia coli NOT DETECTED  NOT DETECTED Final   Klebsiella oxytoca NOT DETECTED NOT DETECTED Final   Klebsiella pneumoniae NOT DETECTED NOT DETECTED Final   Proteus species NOT DETECTED NOT DETECTED Final   Serratia marcescens NOT DETECTED NOT DETECTED Final   Haemophilus influenzae NOT DETECTED NOT DETECTED Final   Neisseria meningitidis NOT DETECTED NOT DETECTED Final   Pseudomonas aeruginosa NOT DETECTED NOT DETECTED Final   Candida albicans NOT DETECTED NOT DETECTED Final   Candida glabrata NOT DETECTED NOT DETECTED Final   Candida krusei NOT DETECTED NOT DETECTED Final   Candida parapsilosis NOT DETECTED NOT DETECTED Final   Candida tropicalis NOT DETECTED NOT DETECTED Final    Comment: Performed at [facility], [place] [address], [place], [place] [address]         Radiology Studies: Ct Head Wo Contrast  Result Date: 07/14/2018 CLINICAL DATA:  Altered level of consciousness, evaluate for intracranial hemorrhage EXAM: CT HEAD WITHOUT CONTRAST TECHNIQUE: Contiguous axial images were obtained from the base of the skull through the vertex without intravenous contrast. COMPARISON:  06/15/2018 FINDINGS: Brain: Remote right thalamic lacunar infarct. Small remote right cerebellar infarct. Chronic small vessel ischemic gliosis in the cerebral white matter. Small remote right parietal cortex infarct at the vertex. There is cerebral volume loss with congruent ventriculomegaly. No collection, acute infarct, hemorrhage, or mass lesion. Vascular: Negative Skull: Negative Sinuses/Orbits: Mucosal thickening in the left sphenoid sinus, chronic. IMPRESSION: 1. No  acute finding. 2. Chronic small vessel disease with small remote infarcts as described. Electronically Signed   By: Monte Fantasia M.D.   On: 07/14/2018 06:33   US Renal  Result Date: 07/14/2018 CLINICAL DATA:  Initial evaluation for acute kidney injury. EXAM: RENAL / URINARY TRACT ULTRASOUND COMPLETE COMPARISON:  None. FINDINGS: Right Kidney: Renal measurements: 11.0 x 5.4 x 5.6 cm = volume: 175.0 mL. Diffusely increased echogenicity within the renal parenchyma with diffuse cortical thinning. No hydronephrosis or discrete mass. Left Kidney: Renal measurements: 9.9 x 5.8 x 5.6 cm = volume: 167.2 mL. Diffusely increased echogenicity within the renal parenchyma with chronic cortical thinning. No hydronephrosis. 3.6 x 2.6 x 2.5 cm simple parapelvic cyst noted within the interpolar kidney. Bladder: Appears normal for degree of bladder distention. IMPRESSION: 1. Diffusely increased echogenicity within the renal parenchyma with chronic cortical thinning, compatible with chronic medical renal disease. 2. No hydronephrosis. 3. 3.6 cm simple left parapelvic cyst. Electronically Signed   By: Jeannine Boga M.D.   On: 07/14/2018 18:54   Dg Chest Port 1 View  Result Date: 07/16/2018 CLINICAL DATA:  Shortness of breath. EXAM: PORTABLE CHEST 1 VIEW COMPARISON:  03/25/2018 FINDINGS: The cardiac silhouette is normal in size. An implanted loop recorder and aortic atherosclerosis are noted. Lung volumes are low with new right perihilar and right basilar opacity. The left lung is clear. No sizable pleural effusion or pneumothorax is identified. Right glenohumeral arthritis is noted. IMPRESSION: Low lung volumes with right perihilar and basilar opacity compatible with pneumonia. Electronically Signed   By: Logan Bores M.D.   On: 06/30/2018 10:42        Scheduled Meds: . acidophilus  1 capsule Oral Daily  . aspirin  81 mg Oral Daily  . donepezil  10 mg Oral Daily  . enoxaparin (LOVENOX) injection  30 mg  Subcutaneous Daily  . levothyroxine  100 mcg Oral QAC breakfast  . methylPREDNISolone (SOLU-MEDROL) injection  40 mg Intravenous Q12H  . metoprolol tartrate  25 mg Oral BID  . mirabegron ER  50  mg Oral QHS  . sodium zirconium cyclosilicate  10 g Oral Once  . vitamin C  500 mg Oral Daily  . zinc sulfate  220 mg Oral Daily   Continuous Infusions: . sodium chloride 1,000 mL (07/14/18 0835)  . sodium chloride    . azithromycin 500 mg (07/14/18 1112)  . cefTRIAXone (ROCEPHIN)  IV 2 g (07/14/18 0838)  . sodium chloride       LOS: 2 days    Time spent: 50 minutes  During this encounter: Patient Isolation: Airborne, contact, droplet HCP PPE: Face shield, head covering, N95, gown, gloves, and shoe covers    Edwin Dada, MD [facility] 07/15/2018, 8:01 AM     Please page through [place]:  [email or web address] Password [email or web address] If 7PM-7AM, please contact night-coverage

## 2018-07-15 NOTE — Progress Notes (Signed)
CRITICAL VALUE ALERT  Critical Value:  Lactic Acid 5.1                          K+ 6.3  Date & Time Notied:  07/15/18  0756  Provider Notified: Dr. Loleta Books  Orders Received/Actions taken: Awaiting orders from provider

## 2018-07-15 NOTE — Care Management (Signed)
Notified Dr. Shanon Brow in regards to pt having bloody sputum and pt agitated. New orders received for 1 time dose of PRN ativan, and to continue to monitor bloody sputum. Also informed MD in regards to rales in patients lungs, and that pt had to be placed back on non-rebreather at 15LPM. Pt then suctioned with [place] to help with secretions. RT notified for request to do deep suctioning to try to clear secretions. Will continue to monitor.

## 2018-07-15 NOTE — Progress Notes (Addendum)
Rapid Response Event Note  Overview:   Asked by charge nurse and RT to check on this patient due to concern for worsening respiratory status.   HPI:  At baseline, per chart review, pt has dementia but is able to walk with a walker and is typically alert and able to talk. Also h/o HTN, OSA (uses cpap), syncopal episodes, and loop recorder. Admitted 5/21 with decreased mentation/responsiveness and dyspnea, found to be COVID+. Has had intermittent episodes of Afib during the past few days.   Per chart and primary RN, pt received a 1L NS bolus midday despite patient being hypertensive requiring 2 PRN doses of IV lopressor. Unclear why this bolus was given. Pt was on RA at that time w/sats 95%. Pt's sats decreased and had episodes of AfibRVR since getting bolus, eventually requiring NRB. Per RN, around 8pm pt coughed up bloody sputum and was acutely anxious and hypertensive. Was given PRN lopressor for HTN and ativan for anxiety. RT deep suctioned patient around 9pm, noting his lungs sounded wet and pt was still tachypneic.   Initial Focused Assessment: Upon my assessment, pt is responsive to voice with poor attention and is oriented to place and person. Calm, resting. Breathing noted to be somewhat labored, RR 40s consistently. On 15L NRB. Loud congestion, weak cough. On auscultation, coarse crackles/rhonchi in upper and lower lungs bilaterally. Palpable pulses.  Interventions: Assessed patient, see below.  Plan of Care (if not transferred): Discussed these findings with primary nurse, recommending she discuss with the MD whether diuretics would be appropriate. She did so and the MD ordered a one-time dose of 20mg  Lasix IV.   Told primary RN to call with any further concerns.   Henreitta Leber, RN [facility] Rapid Response RN 07/14/2018 23:00

## 2018-07-15 NOTE — Progress Notes (Signed)
Called patient's, wife [name] and told her I tried to face time but she was not available. She stated that she decided not to face time as she had spoken with the doctor and was going to plan to come and see patient once a time was decided.

## 2018-07-15 NOTE — Progress Notes (Addendum)
Patient has coarse crackles on auscultation.  O2 increased to 15L d/t sats 82%.  Patient has not had output this afternoon.  Dr. Loleta Books notified via text page. Returned call - no new orders at this time - continue current orders.  Patient's wife able to visit this afternoon.

## 2018-07-15 NOTE — Progress Notes (Signed)
CRITICAL VALUE ALERT  Critical Value:  Potassium 6.3  Date & Time Notied:  07/15/18 at 0743  Provider Notified: Patient's nurse, Jenny Reichmann notified, will be contacting physician  Orders Received/Actions taken: awaiting

## 2018-07-15 NOTE — Progress Notes (Signed)
Spoke with Pts wife Tess and updated her on pts current status.

## 2018-07-15 NOTE — Progress Notes (Signed)
Spoke with patient's wife, [name] and gave her updates on patient. Answered her questions regarding patient. She asked if patient had a good night and I explained that it was not as good as we had hoped but that he was resting comfortably right now. Patient would like to set up a face time call with patient at 1:00.

## 2018-07-16 ENCOUNTER — Inpatient Hospital Stay (HOSPITAL_COMMUNITY): Payer: MEDICARE

## 2018-07-16 LAB — CBC WITH DIFFERENTIAL/PLATELET
Abs Immature Granulocytes: 0.04 10*3/uL (ref 0.00–0.07)
Basophils Absolute: 0 10*3/uL (ref 0.0–0.1)
Basophils Relative: 0 %
Eosinophils Absolute: 0 10*3/uL (ref 0.0–0.5)
Eosinophils Relative: 0 %
HCT: 37.6 % — ABNORMAL LOW (ref 39.0–52.0)
Hemoglobin: 12 g/dL — ABNORMAL LOW (ref 13.0–17.0)
Immature Granulocytes: 0 %
Lymphocytes Relative: 5 %
Lymphs Abs: 0.5 10*3/uL — ABNORMAL LOW (ref 0.7–4.0)
MCH: 31.7 pg (ref 26.0–34.0)
MCHC: 31.9 g/dL (ref 30.0–36.0)
MCV: 99.5 fL (ref 80.0–100.0)
Monocytes Absolute: 0.3 10*3/uL (ref 0.1–1.0)
Monocytes Relative: 3 %
Neutro Abs: 9.7 10*3/uL — ABNORMAL HIGH (ref 1.7–7.7)
Neutrophils Relative %: 92 %
Platelets: 163 10*3/uL (ref 150–400)
RBC: 3.78 MIL/uL — ABNORMAL LOW (ref 4.22–5.81)
RDW: 14.5 % (ref 11.5–15.5)
WBC: 10.6 10*3/uL — ABNORMAL HIGH (ref 4.0–10.5)
nRBC: 0.2 % (ref 0.0–0.2)

## 2018-07-16 LAB — BASIC METABOLIC PANEL
Anion gap: 13 (ref 5–15)
BUN: 83 mg/dL — ABNORMAL HIGH (ref 8–23)
CO2: 15 mmol/L — ABNORMAL LOW (ref 22–32)
Calcium: 7.9 mg/dL — ABNORMAL LOW (ref 8.9–10.3)
Chloride: 113 mmol/L — ABNORMAL HIGH (ref 98–111)
Creatinine, Ser: 2.84 mg/dL — ABNORMAL HIGH (ref 0.61–1.24)
GFR calc Af Amer: 22 mL/min — ABNORMAL LOW (ref >60)
GFR calc non Af Amer: 19 mL/min — ABNORMAL LOW (ref >60)
Glucose, Bld: 120 mg/dL — ABNORMAL HIGH (ref 70–99)
Potassium: 5.1 mmol/L (ref 3.5–5.1)
Sodium: 141 mmol/L (ref 135–145)

## 2018-07-16 LAB — LACTIC ACID, PLASMA: Lactic Acid, Venous: 4.1 mmol/L (ref 0.5–1.9)

## 2018-07-16 LAB — FERRITIN: Ferritin: 2599 ng/mL — ABNORMAL HIGH (ref 24–336)

## 2018-07-16 LAB — CULTURE, BLOOD (ROUTINE X 2): Special Requests: ADEQUATE

## 2018-07-16 LAB — C-REACTIVE PROTEIN: CRP: 7.9 mg/dL — ABNORMAL HIGH (ref <1.0)

## 2018-07-16 LAB — D-DIMER, QUANTITATIVE: D-Dimer, Quant: 2.56 ug/mL-FEU — ABNORMAL HIGH (ref 0.00–0.50)

## 2018-07-16 LAB — MAGNESIUM: Magnesium: 2.4 mg/dL (ref 1.7–2.4)

## 2018-07-16 MED ORDER — METOPROLOL TARTRATE 25 MG PO TABS
50.0000 mg | ORAL_TABLET | Freq: Two times a day (BID) | ORAL | Status: DC
  Administered 2018-07-16: 50 mg via ORAL
  Filled 2018-07-16: qty 2

## 2018-07-16 MED ORDER — DILTIAZEM HCL-DEXTROSE 100-5 MG/100ML-% IV SOLN (PREMIX)
5.0000 mg/h | INTRAVENOUS | Status: DC
  Administered 2018-07-16: 03:00:00 10 mg/h via INTRAVENOUS
  Filled 2018-07-16 (×2): qty 100

## 2018-07-16 MED ORDER — MORPHINE SULFATE (CONCENTRATE) 10 MG/0.5ML PO SOLN
5.0000 mg | ORAL | Status: DC | PRN
  Administered 2018-07-16: 5 mg via ORAL
  Filled 2018-07-16: qty 0.5

## 2018-07-16 MED ORDER — FENTANYL CITRATE (PF) 100 MCG/2ML IJ SOLN
25.0000 ug | INTRAMUSCULAR | Status: DC | PRN
  Administered 2018-07-16: 50 ug via INTRAVENOUS
  Administered 2018-07-16: 25 ug via INTRAVENOUS
  Administered 2018-07-17 (×6): 50 ug via INTRAVENOUS
  Filled 2018-07-16 (×8): qty 2

## 2018-07-16 MED ORDER — METOPROLOL TARTRATE 5 MG/5ML IV SOLN: 5.0000 mg | INTRAVENOUS | Status: DC | PRN

## 2018-07-16 MED ORDER — DILTIAZEM LOAD VIA INFUSION
10.0000 mg | Freq: Once | INTRAVENOUS | Status: AC
  Administered 2018-07-16: 10 mg via INTRAVENOUS
  Filled 2018-07-16: qty 10

## 2018-07-16 MED ORDER — LORAZEPAM 2 MG/ML IJ SOLN
0.5000 mg | INTRAMUSCULAR | Status: DC | PRN
  Administered 2018-07-17: 1 mg via INTRAVENOUS
  Filled 2018-07-16: qty 1

## 2018-07-16 NOTE — Progress Notes (Signed)
Dr. Thereasa Solo @ bedside, new orders received.

## 2018-07-16 NOTE — Progress Notes (Addendum)
Pt non-responsive, SBP <60 MAP <50, HR 50-70s, RR 30-40s, O2 85-100% on 15L NRB. Dr. Loleta Books paged to notify.

## 2018-07-16 NOTE — Progress Notes (Signed)
Cardizem drip started per MD orders. Second RN, Joelene Millin at bedside for double verification.

## 2018-07-16 NOTE — Plan of Care (Signed)
Patient's spouse, [name] spoke w/patient via FaceTime, also updated on POC, agreeable with plan, denies question/concerns at this time.

## 2018-07-16 NOTE — Progress Notes (Signed)
RT called for Rapid Response.  Patient on NRB 15 L, with Rhonchi breath sounds, and labored effort.  RT - NTS suctioned patient with a moderate amount of tan pink secretions. RN at bedside.

## 2018-07-16 NOTE — Progress Notes (Signed)
Dr. Bonner Puna at the bedside discussing plan of care.

## 2018-07-16 NOTE — Progress Notes (Signed)
Fentanyl given for air hunger: 69mcg at 2014 with little relief noted. 69mcg given now. Will continue to assess.

## 2018-07-16 NOTE — Progress Notes (Signed)
07/16/2018 0200 inserted 16Fr foley catheter using aseptic technique. Bulb inflated with 41mls normal saline. Catheter secured to right inner thigh.  Pericare and foley care performed.  Insertion Buddy Fredrich Romans, RN.

## 2018-07-16 NOTE — Progress Notes (Addendum)
Called wife, [name] to introduce myself as the patient's RN for the shift. Encouraged any questions and or requests. I asked [name] if there was any particular music that the patient would like to listen to and she said that he likes Big Band music from the 1940's. I told her that I would play it for him through the night. She was appreciative.

## 2018-07-16 NOTE — Progress Notes (Signed)
Responded to Rapid Response call. Pt unresponsive, SB 50's hypotensive SBP 60's. SATs  88 on 15L NRB. Pt is a DNR. Called Dr. Thereasa Solo to bedside. Called patient's wife and updated on patient's decline. Wife spoke to husband via speakerphone and will call back with a number in order to facetime. All questions were answered. MD provided comfort orders to patient's RN.

## 2018-07-16 NOTE — Progress Notes (Signed)
Pt is on maximum dose for cardizem drip.  Target heart rate of 65-110 has not been reached.  BP 121/66.  On-call physician notified.  Pts current heart rate is ranging from 100-130.

## 2018-07-16 NOTE — Progress Notes (Signed)
CRITICAL VALUE ALERT  Critical Value:  Lactic Acid 4.1  Date & Time Notied:  07/16/18 at 0837  Provider Notified: patient's nurse, [place] notified  Orders Received/Actions taken: awaiting

## 2018-07-16 NOTE — Progress Notes (Signed)
PROGRESS NOTE    Randy Sullivan  TLX:[id number] DOB: [birth year] DOA: 07/17/2018 PCP: Randy Solian, MD      Brief Narrative:  Mr. Randy Sullivan is a 85 y.o. M with dementia, home-dwelling, HTN, hypothyroidism, OSA and recurrent syncope who presented with 3 days decreased mentation/responsiveness and also apparent dyspnea.  In ER, CXR showed RLL opacity, hypoxic requiring NRB and SARS-CoV-2 NAA was positive.  Given Actemra and admitted to [facility].      Assessment & Plan:  Acute kidney injury Baseline 1.0 mg/dL, Cr 1.8 on admission.    Renal US shows chronic disease, no hydronephrosis.  FeNA shows pre-renal disease.  Trial of Lasix resulted in no improvement in Cr, does not appears edematous, doubt congestive nephropathy. Urine without heme or RBCs to suggest COVID related angiopathy.  Likely sepsis related pre-renal injury.  Creatinine worsening to 2.8 today   Hyperkalemia Lokelma given, K improved  Coronavirus pneumonitis with acute hypoxic respiratory failure In setting of ongoing 2020 COVID-19 pandemic.  Given Actemra 5/21 and steroids.  Hep B SAg negative.  Continues to worsen.  Infllammatory markers elevated.  Requires frequent suctioning and NRB at 15L.  -Continue Supplemental O2 -Stop steroids -VTE PPx with Lovenox, standard dose -Continue Zinc and Vitamin C -Daily d-dimer, ferritin and CRP -Start Lorazepam for agitation, IV fentanyl or oral morphine for dyspnea or respiratory distress   Sepsis from RLL pneumonia Presented with tachycardia, fever, lactate >2.  Suspect superimposed RLL CAP.  Procal >20.  Blood cultures with CoNS in 1/4, suspect contaminant.  Strep urine antigen negative.  No response to antibiotics -Continue ceftriaxone and azithromycin  Acute metabolic encephalopathy From COVID.  CT head unremarkable.  No focal deficits to suggest stroke. No improvement  Brief atrial fibrillation CHA2DS2-VASc Score = 3 (age, HTN).  Has an implantable loop  recorder but no previously detected afib ever.  Now overnight, had rapid rates again, diltiazem gtt started.  This morning, hypotensive to 90s, diltiazem stopped.  -Hold diltiazem -Hold anticoagulation for now  Hypertension Hypotensive today -Hold metoprolol -Continue aspirin  Dementia  Hypothyroidism -Continue levothyroxine  Hyponatremia Mild  Anemia Mild, no clinical bleeding -Hold home iron  Coagulase negative staph positive blood culture Likely contaminant         MDM and disposition: The below labs and imaging reports reviewed and summarized above.  Medication management as above.  The patient is declining substantially today.  Goals of care were discussed with the wife, we will continue antibiotics for now, but provide palliative measures for agitation dyspnea.  We expect an imminent hospital death.         DVT prophylaxis: Lovenox Code Status: DO NOT RESUSCITATE Family Communication: Wife    Consultants:   None  Procedures:   Renal US  Antimicrobials:   Ceftriaoxne 5/21 >>  Azithromycin 5/21 >>    Subjective: Tachypneic, respiratory distress, rales, hemoptysis, weak.  Hypotensive.      Objective: Vitals:   07/16/18 1130 07/16/18 1200 07/16/18 1230 07/16/18 1330  BP: (!) 62/42 (!) 71/35 (!) 82/52 (!) 93/57  Pulse: (!) 55 (!) 32    Resp: (!) 35 (!) 37 (!) 38 (!) 38  Temp:      TempSrc:      SpO2: (!) 89% 90%    Weight:      Height:        Intake/Output Summary (Last 24 hours) at 07/16/2018 1459 Last data filed at 07/16/2018 0600 Gross per 24 hour  Intake 400 ml  Output  375 ml  Net 25 ml   Filed Weights   07/14/2018 0950  Weight: 76.7 kg    Examination: General appearance: Frail elderly adult male, lethargic.  Make eye contact, attempts to verbalize but is incoherent. HEENT: Anicteric, conjunctival pink, lids and lashes atrophic.  No nasal deformity, discharge, or epistaxis.  Lips dry, dentition normal.  Oropharynx  very dry, no oral lesions, hearing unable to assess. Skin: Mottling in the extremities, remedies cold, moist. Cardiac: Tachycardic, irregular, no lower extremity edema, JVP normal. Respiratory: Tachypneic, rales bilaterally, diminished throughout, respiratory distress noted. Abdomen: Abdomen without tenderness to palpation or guarding.  No distention or hepatosplenomegaly. MSK: Normal muscle bulk and tone for age. Neuro: Lethargic, awake, attempts to verbalize but cannot make any intelligible communication.  Very weak.    Psych: Unable to assess.      Data Reviewed: I have personally reviewed following labs and imaging studies:  CBC: Recent Labs  Lab 07/09/2018 0932 07/14/18 0448 07/15/18 0447 07/16/18 0625  WBC 4.2 9.9 11.9* 10.6*  NEUTROABS 2.8 8.6* 10.8* 9.7*  HGB 12.0* 11.5* 13.2 12.0*  HCT 37.6* 33.9* 41.8 37.6*  MCV 97.7 96.3 100.7* 99.5  PLT 167 140* 196 865   Basic Metabolic Panel: Recent Labs  Lab 07/07/2018 0932 07/14/18 0448 07/14/18 0500 07/15/18 0447 07/15/18 1030 07/16/18 0625  NA 133*  --  135 139  --  141  K 3.8  --  4.3 6.3* 5.9* 5.1  CL 101  --  107 107  --  113*  CO2 20*  --  16* 17*  --  15*  GLUCOSE 96  --  130* 133*  --  120*  BUN 28*  --  38* 55*  --  83*  CREATININE 1.88*  --  2.01* 2.61*  --  2.84*  CALCIUM 8.8*  --  8.2* 8.4*  --  7.9*  MG  --  1.8  --  2.3  --  2.4  PHOS  --  5.1*  --   --   --   --    GFR: Estimated Creatinine Clearance: 19.1 mL/min (A) (by C-G formula based on SCr of 2.84 mg/dL (H)). Liver Function Tests: Recent Labs  Lab 07/17/2018 0932  AST 31  ALT 27  ALKPHOS 62  BILITOT 0.6  PROT 7.1  ALBUMIN 3.7   No results for input(s): LIPASE, AMYLASE in the last 168 hours. No results for input(s): AMMONIA in the last 168 hours. Coagulation Profile: No results for input(s): INR, PROTIME in the last 168 hours. Cardiac Enzymes: No results for input(s): CKTOTAL, CKMB, CKMBINDEX, TROPONINI in the last 168 hours. BNP (last  3 results) No results for input(s): PROBNP in the last 8760 hours. HbA1C: No results for input(s): HGBA1C in the last 72 hours. CBG: No results for input(s): GLUCAP in the last 168 hours. Lipid Profile: No results for input(s): CHOL, HDL, LDLCALC, TRIG, CHOLHDL, LDLDIRECT in the last 72 hours. Thyroid Function Tests: No results for input(s): TSH, T4TOTAL, FREET4, T3FREE, THYROIDAB in the last 72 hours. Anemia Panel: Recent Labs    07/15/18 0447 07/16/18 0625  FERRITIN >7,500* 2,599*   Urine analysis:    Component Value Date/Time   COLORURINE YELLOW 07/11/2018 0932   APPEARANCEUR CLEAR 07/03/2018 0932   LABSPEC 1.016 06/26/2018 0932   PHURINE 5.0 07/07/2018 0932   GLUCOSEU NEGATIVE 06/26/2018 0932   HGBUR NEGATIVE 06/29/2018 0932   BILIRUBINUR NEGATIVE 06/30/2018 0932   KETONESUR NEGATIVE 07/23/2018 0932   PROTEINUR 30 (A) 07/08/2018  0932   NITRITE NEGATIVE 07/02/2018 0932   LEUKOCYTESUR NEGATIVE 07/22/2018 0932   Sepsis Labs: @LABRCNTIP (procalcitonin:4,lacticacidven:4)  ) Recent Results (from the past 240 hour(s))  Blood Culture (routine x 2)     Status: Abnormal   Collection Time: 07/16/2018  9:32 AM  Result Value Ref Range Status   Specimen Description   Final    BLOOD RIGHT ANTECUBITAL Performed at [facility], [place] [address], [place], [place] [address]    Special Requests   Final    BOTTLES DRAWN AEROBIC AND ANAEROBIC Blood Culture adequate volume Performed at [facility] [address], [place], [place] [address]    Culture  Setup Time   Final    GRAM POSITIVE COCCI IN CLUSTERS ANAEROBIC BOTTLE ONLY Organism ID to follow CRITICAL RESULT CALLED TO, READ BACK BY AND VERIFIED WITH: Jene Every PharmD 12:05 07/14/18 ([email or web address])    Culture (A)  Final    STAPHYLOCOCCUS SPECIES (COAGULASE NEGATIVE) THE SIGNIFICANCE OF ISOLATING THIS ORGANISM FROM A SINGLE SET OF BLOOD CULTURES WHEN MULTIPLE SETS ARE DRAWN IS UNCERTAIN. PLEASE NOTIFY THE  MICROBIOLOGY DEPARTMENT WITHIN ONE WEEK IF SPECIATION AND SENSITIVITIES ARE REQUIRED. Performed at [facility], [place] [address], [place], [place] [address]    Report Status 07/16/2018 FINAL  Final  Blood Culture (routine x 2)     Status: None (Preliminary result)   Collection Time: 06/24/2018  9:32 AM  Result Value Ref Range Status   Specimen Description   Final    BLOOD RIGHT ARM Performed at [facility] [address] [place], [place] [address]    Special Requests   Final    BOTTLES DRAWN AEROBIC AND ANAEROBIC Blood Culture adequate volume Performed at [facility] [address] [place], [place] [address]    Culture   Final    NO GROWTH 3 DAYS Performed at [facility], [place] [address] [place], [place] [address]    Report Status PENDING  Incomplete  SARS Coronavirus 2 (CEPHEID- Performed in [facility] hospital lab), Hosp Order     Status: Abnormal   Collection Time: 07/17/2018  9:32 AM  Result Value Ref Range Status   SARS Coronavirus 2 POSITIVE (A) NEGATIVE Final    Comment: RESULT CALLED TO, READ BACK BY AND VERIFIED WITH: S.BINGHAM RN AT 1124 ON 07/02/2018 BY S.VANHOORNE (NOTE) If result is NEGATIVE SARS-CoV-2 target nucleic acids are NOT DETECTED. The SARS-CoV-2 RNA is generally detectable in upper and lower  respiratory specimens during the acute phase of infection. The lowest  concentration of SARS-CoV-2 viral copies this assay can detect is 250  copies / mL. A negative result does not preclude SARS-CoV-2 infection  and should not be used as the sole basis for treatment or other  patient management decisions.  A negative result may occur with  improper specimen collection / handling, submission of specimen other  than nasopharyngeal swab, presence of viral mutation(s) within the  areas targeted by this assay, and inadequate number of viral copies  (<250 copies / mL). A negative result must be combined with clinical    observations, patient history, and epidemiological information. If result is POSITIVE SARS-CoV-2 target nucleic acids are  DETECTED. The SARS-CoV-2 RNA is generally detectable in upper and lower  respiratory specimens during the acute phase of infection.  Positive  results are indicative of active infection with SARS-CoV-2.  Clinical  correlation with patient history and other diagnostic information is  necessary  to determine patient infection status.  Positive results do  not rule out bacterial infection or co-infection with other viruses. If result is PRESUMPTIVE POSTIVE SARS-CoV-2 nucleic acids MAY BE PRESENT.   A presumptive positive result was obtained on the submitted specimen  and confirmed on repeat testing.  While 2019 novel coronavirus  (SARS-CoV-2) nucleic acids may be present in the submitted sample  additional confirmatory testing may be necessary for epidemiological  and / or clinical management purposes  to differentiate between  SARS-CoV-2 and other Sarbecovirus currently known to infect humans.  If clinically indicated additional testing with an alternate test  methodology  [phone]) is advised. The SARS-CoV-2 RNA is generally  detectable in upper and lower respiratory specimens during the acute  phase of infection. The expected result is Negative. Fact Sheet for Patients:  StrictlyIdeas.no Fact Sheet for Healthcare Providers: BankingDealers.co.za This test is not yet approved or cleared by the [place] FDA and has been authorized for detection and/or diagnosis of SARS-CoV-2 by FDA under an Emergency Use Authorization (EUA).  This EUA will remain in effect (meaning this test can be used) for the duration of the COVID-19 declaration under Section 564(b)(1) of the Act, 21 U.S.C. section 360bbb-3(b)(1), unless the authorization is terminated or revoked sooner. Performed at [facility], [place]  [address], [place], [place] [address]   Urine culture     Status: None   Collection Time: 06/24/2018  9:32 AM  Result Value Ref Range Status   Specimen Description   Final    URINE, CLEAN CATCH Performed at [facility], [place] [address], [place], [place] [address]    Special Requests   Final    NONE Performed at [facility], [place] [address], [place], [place] [address]    Culture   Final    NO GROWTH Performed at [facility], [place] [address] [place], [place] [address]    Report Status 07/14/2018 FINAL  Final  Blood Culture ID Panel (Reflexed)     Status: Abnormal   Collection Time: 07/22/2018  9:32 AM  Result Value Ref Range Status   Enterococcus species NOT DETECTED NOT DETECTED Final   Listeria monocytogenes NOT DETECTED NOT DETECTED Final   Staphylococcus species DETECTED (A) NOT DETECTED Final    Comment: Methicillin (oxacillin) resistant coagulase negative staphylococcus. Possible blood culture contaminant (unless isolated from more than one blood culture draw or clinical case suggests pathogenicity). No antibiotic treatment is indicated for blood  culture contaminants. CRITICAL RESULT CALLED TO, READ BACK BY AND VERIFIED WITH: Jene Every PharmD 12:05 07/14/18 ([email or web address])    Staphylococcus aureus (BCID) NOT DETECTED NOT DETECTED Final   Methicillin resistance DETECTED (A) NOT DETECTED Final    Comment: CRITICAL RESULT CALLED TO, READ BACK BY AND VERIFIED WITH: Jene Every PharmD 12:05 07/14/18 ([email or web address])    Streptococcus species NOT DETECTED NOT DETECTED Final   Streptococcus agalactiae NOT DETECTED NOT DETECTED Final   Streptococcus pneumoniae NOT DETECTED NOT DETECTED Final   Streptococcus pyogenes NOT DETECTED NOT DETECTED Final   Acinetobacter baumannii NOT DETECTED NOT DETECTED Final   Enterobacteriaceae species NOT DETECTED NOT DETECTED Final   Enterobacter cloacae complex NOT DETECTED NOT DETECTED Final   Escherichia coli NOT DETECTED NOT  DETECTED Final   Klebsiella oxytoca NOT DETECTED NOT DETECTED Final   Klebsiella pneumoniae NOT DETECTED NOT DETECTED Final   Proteus species NOT DETECTED NOT DETECTED Final   Serratia marcescens NOT DETECTED NOT DETECTED Final  Haemophilus influenzae NOT DETECTED NOT DETECTED Final   Neisseria meningitidis NOT DETECTED NOT DETECTED Final   Pseudomonas aeruginosa NOT DETECTED NOT DETECTED Final   Candida albicans NOT DETECTED NOT DETECTED Final   Candida glabrata NOT DETECTED NOT DETECTED Final   Candida krusei NOT DETECTED NOT DETECTED Final   Candida parapsilosis NOT DETECTED NOT DETECTED Final   Candida tropicalis NOT DETECTED NOT DETECTED Final    Comment: Performed at [facility], [place] [address] [place], [place] [address]         Radiology Studies: US Renal  Result Date: 07/14/2018 CLINICAL DATA:  Initial evaluation for acute kidney injury. EXAM: RENAL / URINARY TRACT ULTRASOUND COMPLETE COMPARISON:  None. FINDINGS: Right Kidney: Renal measurements: 11.0 x 5.4 x 5.6 cm = volume: 175.0 mL. Diffusely increased echogenicity within the renal parenchyma with diffuse cortical thinning. No hydronephrosis or discrete mass. Left Kidney: Renal measurements: 9.9 x 5.8 x 5.6 cm = volume: 167.2 mL. Diffusely increased echogenicity within the renal parenchyma with chronic cortical thinning. No hydronephrosis. 3.6 x 2.6 x 2.5 cm simple parapelvic cyst noted within the interpolar kidney. Bladder: Appears normal for degree of bladder distention. IMPRESSION: 1. Diffusely increased echogenicity within the renal parenchyma with chronic cortical thinning, compatible with chronic medical renal disease. 2. No hydronephrosis. 3. 3.6 cm simple left parapelvic cyst. Electronically Signed   By: Jeannine Boga M.D.   On: 07/14/2018 18:54   Dg Chest Port 1 View  Result Date: 07/16/2018 CLINICAL DATA:  Shortness of breath EXAM: PORTABLE CHEST 1 VIEW COMPARISON:  07/15/2018 FINDINGS: Diffuse hazy  bilateral airspace opacities are again noted. These have not significantly changed from prior study. There is no pneumothorax. There may be a trace left-sided pleural effusion. The cardiac silhouette is stable. In the facet pacemaker is noted. IMPRESSION: Stable diffuse bilateral hazy airspace opacities consistent with atypical pneumonia. Electronically Signed   By: Constance Holster M.D.   On: 07/16/2018 03:31   Dg Chest Port 1 View  Result Date: 07/15/2018 CLINICAL DATA:  Chest rales.  Positive COVID-19 EXAM: PORTABLE CHEST 1 VIEW COMPARISON:  07/04/2018 FINDINGS: Diffuse bilateral airspace disease with significant progression since the prior study. Heart size normal. Cardiac loop recorder. No effusion. IMPRESSION: Significant progression and diffuse bilateral airspace disease. Probable pneumonia. Electronically Signed   By: Franchot Gallo M.D.   On: 07/15/2018 09:30        Scheduled Meds:  acidophilus  1 capsule Oral Daily   aspirin  81 mg Oral Daily   donepezil  10 mg Oral Daily   enoxaparin (LOVENOX) injection  30 mg Subcutaneous Daily   levothyroxine  100 mcg Oral QAC breakfast   metoprolol tartrate  50 mg Oral BID   mirabegron ER  50 mg Oral QHS   vitamin C  500 mg Oral Daily   zinc sulfate  220 mg Oral Daily   Continuous Infusions:  sodium chloride 1,000 mL (07/14/18 0835)   sodium chloride 100 mL/hr at 07/16/18 1208   azithromycin 500 mg (07/16/18 0954)   cefTRIAXone (ROCEPHIN)  IV 2 g (07/16/18 0922)   diltiazem (CARDIZEM) infusion Stopped (07/16/18 1056)     LOS: 3 days    Time spent: 35 minutes  During this encounter: Patient Isolation: Airborne, contact, droplet HCP PPE: Face shield, head covering, N95, gown, gloves, and shoe covers    Edwin Dada, MD [facility] 07/16/2018, 2:59 PM     Please page through [place]:  [email or web address] Password [email or web address] If 7PM-7AM,  please contact night-coverage

## 2018-07-16 NOTE — Progress Notes (Signed)
Lab notified critical latic acid 4.1. Dr. Loleta Books paged with notification via [place], arrived at bedside.

## 2018-07-17 LAB — C-REACTIVE PROTEIN: CRP: 4.6 mg/dL — ABNORMAL HIGH (ref <1.0)

## 2018-07-17 LAB — BASIC METABOLIC PANEL
Anion gap: 11 (ref 5–15)
BUN: 89 mg/dL — ABNORMAL HIGH (ref 8–23)
CO2: 16 mmol/L — ABNORMAL LOW (ref 22–32)
Calcium: 8.1 mg/dL — ABNORMAL LOW (ref 8.9–10.3)
Chloride: 119 mmol/L — ABNORMAL HIGH (ref 98–111)
Creatinine, Ser: 2.8 mg/dL — ABNORMAL HIGH (ref 0.61–1.24)
GFR calc Af Amer: 22 mL/min — ABNORMAL LOW (ref >60)
GFR calc non Af Amer: 19 mL/min — ABNORMAL LOW (ref >60)
Glucose, Bld: 114 mg/dL — ABNORMAL HIGH (ref 70–99)
Potassium: 5 mmol/L (ref 3.5–5.1)
Sodium: 146 mmol/L — ABNORMAL HIGH (ref 135–145)

## 2018-07-17 LAB — CBC WITH DIFFERENTIAL/PLATELET
Abs Immature Granulocytes: 0.11 10*3/uL — ABNORMAL HIGH (ref 0.00–0.07)
Basophils Absolute: 0 10*3/uL (ref 0.0–0.1)
Basophils Relative: 0 %
Eosinophils Absolute: 0 10*3/uL (ref 0.0–0.5)
Eosinophils Relative: 0 %
HCT: 36.7 % — ABNORMAL LOW (ref 39.0–52.0)
Hemoglobin: 11.5 g/dL — ABNORMAL LOW (ref 13.0–17.0)
Immature Granulocytes: 1 %
Lymphocytes Relative: 3 %
Lymphs Abs: 0.4 10*3/uL — ABNORMAL LOW (ref 0.7–4.0)
MCH: 31.3 pg (ref 26.0–34.0)
MCHC: 31.3 g/dL (ref 30.0–36.0)
MCV: 99.7 fL (ref 80.0–100.0)
Monocytes Absolute: 0.2 10*3/uL (ref 0.1–1.0)
Monocytes Relative: 2 %
Neutro Abs: 9.9 10*3/uL — ABNORMAL HIGH (ref 1.7–7.7)
Neutrophils Relative %: 94 %
Platelets: 147 10*3/uL — ABNORMAL LOW (ref 150–400)
RBC: 3.68 MIL/uL — ABNORMAL LOW (ref 4.22–5.81)
RDW: 14.6 % (ref 11.5–15.5)
WBC: 10.6 10*3/uL — ABNORMAL HIGH (ref 4.0–10.5)
nRBC: 0.2 % (ref 0.0–0.2)

## 2018-07-17 LAB — D-DIMER, QUANTITATIVE: D-Dimer, Quant: 2.83 ug/mL-FEU — ABNORMAL HIGH (ref 0.00–0.50)

## 2018-07-17 LAB — FERRITIN: Ferritin: 1812 ng/mL — ABNORMAL HIGH (ref 24–336)

## 2018-07-17 LAB — LACTIC ACID, PLASMA: Lactic Acid, Venous: 1.9 mmol/L (ref 0.5–1.9)

## 2018-07-17 MED ORDER — FENTANYL 2500MCG IN NS 250ML (10MCG/ML) PREMIX INFUSION
50.0000 ug/h | INTRAVENOUS | Status: DC
  Administered 2018-07-17: 25 ug/h via INTRAVENOUS
  Administered 2018-07-18 (×2): 50 ug/h via INTRAVENOUS
  Filled 2018-07-17: qty 250

## 2018-07-17 MED ORDER — FENTANYL CITRATE (PF) 100 MCG/2ML IJ SOLN: 50.0000 ug | INTRAMUSCULAR | Status: DC | PRN

## 2018-07-17 MED ORDER — HALOPERIDOL LACTATE 2 MG/ML PO CONC
0.5000 mg | ORAL | Status: DC | PRN
  Filled 2018-07-17: qty 0.3

## 2018-07-17 MED ORDER — POLYVINYL ALCOHOL 1.4 % OP SOLN
1.0000 [drp] | Freq: Four times a day (QID) | OPHTHALMIC | Status: DC | PRN
  Filled 2018-07-17: qty 15

## 2018-07-17 MED ORDER — HALOPERIDOL LACTATE 5 MG/ML IJ SOLN: 0.5000 mg | INTRAMUSCULAR | Status: DC | PRN

## 2018-07-17 MED ORDER — HALOPERIDOL 0.5 MG PO TABS
0.5000 mg | ORAL_TABLET | ORAL | Status: DC | PRN
  Filled 2018-07-17: qty 1

## 2018-07-17 MED ORDER — GLYCOPYRROLATE 0.2 MG/ML IJ SOLN: 0.2000 mg | INTRAMUSCULAR | Status: DC | PRN

## 2018-07-17 MED ORDER — GLYCOPYRROLATE 0.2 MG/ML IJ SOLN
0.2000 mg | INTRAMUSCULAR | Status: DC | PRN
  Administered 2018-07-18 (×2): 0.2 mg via INTRAVENOUS
  Filled 2018-07-17 (×2): qty 1

## 2018-07-17 MED ORDER — ONDANSETRON 4 MG PO TBDP: 4.0000 mg | ORAL_TABLET | Freq: Four times a day (QID) | ORAL | Status: DC | PRN

## 2018-07-17 MED ORDER — BIOTENE DRY MOUTH MT LIQD: 15.0000 mL | OROMUCOSAL | Status: DC | PRN

## 2018-07-17 MED ORDER — ONDANSETRON HCL 4 MG/2ML IJ SOLN: 4.0000 mg | Freq: Four times a day (QID) | INTRAMUSCULAR | Status: DC | PRN

## 2018-07-17 MED ORDER — GLYCOPYRROLATE 1 MG PO TABS
1.0000 mg | ORAL_TABLET | ORAL | Status: DC | PRN
  Filled 2018-07-17: qty 1

## 2018-07-17 NOTE — Progress Notes (Signed)
PROGRESS NOTE    Randy Sullivan  NWG:[id number] DOB: [birth year] DOA: 07/06/2018 PCP: Prince Solian, MD      Brief Narrative:  Randy Sullivan is a 85 y.o. M with dementia, home-dwelling, HTN, hypothyroidism, OSA and recurrent syncope who presented with 3 days decreased mentation/responsiveness and also apparent dyspnea.  In ER, CXR showed RLL opacity, hypoxic requiring NRB and SARS-CoV-2 NAA was positive.  Given Actemra and admitted to [facility].      Assessment & Plan:  Coronavirus pneumonitis with acute hypoxic respiratory failure Sepsis from RLL pneumonia Presented with tachycardia, fever, lactate >2 in setting of ongoing 2020 COVID-19 pandemic.  Suspect superimposed RLL CAP.  Procal >20.  Given Actemra on admission 5/21 and steroids.  Hep B SAg negative.  Had progressively worse respiratory failure, complicated by acute renal failure and Afib with RVR.    Mentation worsening in last 24 hours, renal function no improvement.  His prognosis for full recovery is low.  Have discussed goals of care with wife, who desires full comfort measures.  Will stop antibiotics, fluids and lab testing.    -Full comfort measures -Fentanyl gtt and pushes -Lorazepam for agitation    Acute kidney injury Baseline 1.0 mg/dL, Cr 1.8 on admission.      Renal US showed chronic disease, no hydronephrosis.  FeNA suggested renal underperfusion.  Trial of Lasix resulted in no improvement in Cr, did not appears edematous, doubt congestive nephropathy. Urine without heme or RBCs to suggest COVID related angiopathy.  Likely sepsis related pre-renal injury.  Creatinine 2.8 today, no improvement.  Hyperkalemia  Acute metabolic encephalopathy From COVID, sepsis.  Worsening.  New onset atrial fibrillation Never previously noted on ILR.  Developed now in setting of COVID and sepsis.  Hypotensive with diltiazem gtt yesterday. -Stop rate controlling agents  Hypertension  Dementia  Hypothyroidism   Hyponatremia  Anemia From chronic disease  Coagulase negative staph positive blood culture Only a single culture.  Likely contaminant         MDM and disposition: The below labs and imaging reports were reviewed and summarized above.  Medication management as above.  Goals of care discussed with wife, CODE STATUS reconfirmed, imminent death discussed, agreed to withdraw antibiotics, rate controlling medicines.  Pursue full comfort measures.  Still expecting imminent hospital death.         DVT prophylaxis: Lovenox Code Status: DO NOT RESUSCITATE Family Communication: Wife    Consultants:   None  Procedures:   Renal US  Antimicrobials:   Ceftriaoxne 5/21 >>  Azithromycin 5/21 >>    Subjective: More calm overnight.  Air hunger at times, relieved with IV fentanyl.   Urine output low but present.  No fever.      Objective: Vitals:   07/17/18 0435 07/17/18 0500 07/17/18 0600 07/17/18 0845  BP:  118/63 136/85 137/70  Pulse: (!) 37 (!) 43 72   Resp: 20 (!) 25 (!) 25   Temp:    98.5 F (36.9 C)  TempSrc:    Axillary  SpO2: 96% 96% 96%   Weight:      Height:        Intake/Output Summary (Last 24 hours) at 07/17/2018 1200 Last data filed at 07/17/2018 1000 Gross per 24 hour  Intake 1249.26 ml  Output 885 ml  Net 364.26 ml   Filed Weights   07/22/2018 0950  Weight: 76.7 kg    Examination: General appearance: Elderly adult male, not responsive to voice, appears lethargic. HEENT: Eyes closed,  lids and lashes atrophic, no nasal discharge or epistaxis, lips dry, oropharynx dry, no oral lesions, dentition normal Skin: Extremities cold, pale. Cardiac: Tachycardic, irregular, no lower extremity pitting edema, JVP normal Respiratory: Respiratory rate normal, rales bilaterally, diminished, breathing now appears more comfortable. Abdomen: Abdomen without distention, rigidity, guarding MSK: Normal muscle bulk and tone for age. Neuro: Lethargic, weak,  does not arouse to noxious stimuli.    Psych: Unable to assess.      Data Reviewed: I have personally reviewed following labs and imaging studies:  CBC: Recent Labs  Lab 07/11/2018 0932 07/14/18 0448 07/15/18 0447 07/16/18 0625 07/17/18 0500  WBC 4.2 9.9 11.9* 10.6* 10.6*  NEUTROABS 2.8 8.6* 10.8* 9.7* 9.9*  HGB 12.0* 11.5* 13.2 12.0* 11.5*  HCT 37.6* 33.9* 41.8 37.6* 36.7*  MCV 97.7 96.3 100.7* 99.5 99.7  PLT 167 140* 196 163 643*   Basic Metabolic Panel: Recent Labs  Lab 07/11/2018 0932 07/14/18 0448 07/14/18 0500 07/15/18 0447 07/15/18 1030 07/16/18 0625 07/17/18 0500  NA 133*  --  135 139  --  141 146*  K 3.8  --  4.3 6.3* 5.9* 5.1 5.0  CL 101  --  107 107  --  113* 119*  CO2 20*  --  16* 17*  --  15* 16*  GLUCOSE 96  --  130* 133*  --  120* 114*  BUN 28*  --  38* 55*  --  83* 89*  CREATININE 1.88*  --  2.01* 2.61*  --  2.84* 2.80*  CALCIUM 8.8*  --  8.2* 8.4*  --  7.9* 8.1*  MG  --  1.8  --  2.3  --  2.4  --   PHOS  --  5.1*  --   --   --   --   --    GFR: Estimated Creatinine Clearance: 19.4 mL/min (A) (by C-G formula based on SCr of 2.8 mg/dL (H)). Liver Function Tests: Recent Labs  Lab 07/05/2018 0932  AST 31  ALT 27  ALKPHOS 62  BILITOT 0.6  PROT 7.1  ALBUMIN 3.7   No results for input(s): LIPASE, AMYLASE in the last 168 hours. No results for input(s): AMMONIA in the last 168 hours. Coagulation Profile: No results for input(s): INR, PROTIME in the last 168 hours. Cardiac Enzymes: No results for input(s): CKTOTAL, CKMB, CKMBINDEX, TROPONINI in the last 168 hours. BNP (last 3 results) No results for input(s): PROBNP in the last 8760 hours. HbA1C: No results for input(s): HGBA1C in the last 72 hours. CBG: No results for input(s): GLUCAP in the last 168 hours. Lipid Profile: No results for input(s): CHOL, HDL, LDLCALC, TRIG, CHOLHDL, LDLDIRECT in the last 72 hours. Thyroid Function Tests: No results for input(s): TSH, T4TOTAL, FREET4, T3FREE,  THYROIDAB in the last 72 hours. Anemia Panel: Recent Labs    07/16/18 0625 07/17/18 0500  FERRITIN 2,599* 1,812*   Urine analysis:    Component Value Date/Time   COLORURINE YELLOW 06/23/2018 0932   APPEARANCEUR CLEAR 07/15/2018 0932   LABSPEC 1.016 07/03/2018 0932   PHURINE 5.0 07/11/2018 0932   GLUCOSEU NEGATIVE 06/27/2018 0932   HGBUR NEGATIVE 07/19/2018 0932   BILIRUBINUR NEGATIVE 07/11/2018 0932   KETONESUR NEGATIVE 06/26/2018 0932   PROTEINUR 30 (A) 06/23/2018 0932   NITRITE NEGATIVE 07/10/2018 0932   LEUKOCYTESUR NEGATIVE 07/03/2018 0932   Sepsis Labs: @LABRCNTIP (procalcitonin:4,lacticacidven:4)  ) Recent Results (from the past 240 hour(s))  Blood Culture (routine x 2)     Status: Abnormal  Collection Time: 07/21/2018  9:32 AM  Result Value Ref Range Status   Specimen Description   Final    BLOOD RIGHT ANTECUBITAL Performed at [facility], [place] [address] [place], [place] [address]    Special Requests   Final    BOTTLES DRAWN AEROBIC AND ANAEROBIC Blood Culture adequate volume Performed at [facility] [address] [place], [place] [address]    Culture  Setup Time   Final    GRAM POSITIVE COCCI IN CLUSTERS ANAEROBIC BOTTLE ONLY Organism ID to follow CRITICAL RESULT CALLED TO, READ BACK BY AND VERIFIED WITH: Jene Every PharmD 12:05 07/14/18 ([email or web address])    Culture (A)  Final    STAPHYLOCOCCUS SPECIES (COAGULASE NEGATIVE) THE SIGNIFICANCE OF ISOLATING THIS ORGANISM FROM A SINGLE SET OF BLOOD CULTURES WHEN MULTIPLE SETS ARE DRAWN IS UNCERTAIN. PLEASE NOTIFY THE MICROBIOLOGY DEPARTMENT WITHIN ONE WEEK IF SPECIATION AND SENSITIVITIES ARE REQUIRED. Performed at [facility], [place] [address] [place], [place] [address]    Report Status 07/16/2018 FINAL  Final  Blood Culture (routine x 2)     Status: None (Preliminary result)   Collection Time: 07/01/2018  9:32 AM  Result Value Ref Range Status   Specimen Description   Final    BLOOD RIGHT  ARM Performed at [facility] [address], [place], [place] [address]    Special Requests   Final    BOTTLES DRAWN AEROBIC AND ANAEROBIC Blood Culture adequate volume Performed at [facility] [address], [place], [place] [address]    Culture   Final    NO GROWTH 4 DAYS Performed at [facility], [place] [address] [place], [place] [address]    Report Status PENDING  Incomplete  SARS Coronavirus 2 (CEPHEID- Performed in [facility] hospital lab), Hosp Order     Status: Abnormal   Collection Time: 07/07/2018  9:32 AM  Result Value Ref Range Status   SARS Coronavirus 2 POSITIVE (A) NEGATIVE Final    Comment: RESULT CALLED TO, READ BACK BY AND VERIFIED WITH: S.BINGHAM RN AT 1124 ON 07/17/2018 BY S.VANHOORNE (NOTE) If result is NEGATIVE SARS-CoV-2 target nucleic acids are NOT DETECTED. The SARS-CoV-2 RNA is generally detectable in upper and lower  respiratory specimens during the acute phase of infection. The lowest  concentration of SARS-CoV-2 viral copies this assay can detect is 250  copies / mL. A negative result does not preclude SARS-CoV-2 infection  and should not be used as the sole basis for treatment or other  patient management decisions.  A negative result may occur with  improper specimen collection / handling, submission of specimen other  than nasopharyngeal swab, presence of viral mutation(s) within the  areas targeted by this assay, and inadequate number of viral copies  (<250 copies / mL). A negative result must be combined with clinical  observations, patient history, and epidemiological information. If result is POSITIVE SARS-CoV-2 target nucleic acids are  DETECTED. The SARS-CoV-2 RNA is generally detectable in upper and lower  respiratory specimens during the acute phase of infection.  Positive  results are indicative of active infection with SARS-CoV-2.  Clinical  correlation with patient history and other  diagnostic information is  necessary to determine patient infection status.  Positive results do  not rule out bacterial infection or co-infection with other viruses. If result is PRESUMPTIVE POSTIVE SARS-CoV-2 nucleic acids MAY BE PRESENT.   A presumptive positive result was obtained on the submitted  specimen  and confirmed on repeat testing.  While 2019 novel coronavirus  (SARS-CoV-2) nucleic acids may be present in the submitted sample  additional confirmatory testing may be necessary for epidemiological  and / or clinical management purposes  to differentiate between  SARS-CoV-2 and other Sarbecovirus currently known to infect humans.  If clinically indicated additional testing with an alternate test  methodology  [phone]) is advised. The SARS-CoV-2 RNA is generally  detectable in upper and lower respiratory specimens during the acute  phase of infection. The expected result is Negative. Fact Sheet for Patients:  StrictlyIdeas.no Fact Sheet for Healthcare Providers: BankingDealers.co.za This test is not yet approved or cleared by the [place] FDA and has been authorized for detection and/or diagnosis of SARS-CoV-2 by FDA under an Emergency Use Authorization (EUA).  This EUA will remain in effect (meaning this test can be used) for the duration of the COVID-19 declaration under Section 564(b)(1) of the Act, 21 U.S.C. section 360bbb-3(b)(1), unless the authorization is terminated or revoked sooner. Performed at [facility], [place] [address] [place], [place] [address]   Urine culture     Status: None   Collection Time: 07/15/2018  9:32 AM  Result Value Ref Range Status   Specimen Description   Final    URINE, CLEAN CATCH Performed at [facility], [place] [address] [place], [place] [address]    Special Requests   Final    NONE Performed at [facility], [place] [address] [place], [place] [address]    Culture   Final    NO GROWTH Performed at [facility], [place] [address], [place], [place] [address]    Report Status 07/14/2018 FINAL  Final  Blood Culture ID Panel (Reflexed)     Status: Abnormal   Collection Time: 07/12/2018  9:32 AM  Result Value Ref Range Status   Enterococcus species NOT DETECTED NOT DETECTED Final   Listeria monocytogenes NOT DETECTED NOT DETECTED Final   Staphylococcus species DETECTED (A) NOT DETECTED Final    Comment: Methicillin (oxacillin) resistant coagulase negative staphylococcus. Possible blood culture contaminant (unless isolated from more than one blood culture draw or clinical case suggests pathogenicity). No antibiotic treatment is indicated for blood  culture contaminants. CRITICAL RESULT CALLED TO, READ BACK BY AND VERIFIED WITH: Jene Every PharmD 12:05 07/14/18 ([email or web address])    Staphylococcus aureus (BCID) NOT DETECTED NOT DETECTED Final   Methicillin resistance DETECTED (A) NOT DETECTED Final    Comment: CRITICAL RESULT CALLED TO, READ BACK BY AND VERIFIED WITH: Jene Every PharmD 12:05 07/14/18 ([email or web address])    Streptococcus species NOT DETECTED NOT DETECTED Final   Streptococcus agalactiae NOT DETECTED NOT DETECTED Final   Streptococcus pneumoniae NOT DETECTED NOT DETECTED Final   Streptococcus pyogenes NOT DETECTED NOT DETECTED Final   Acinetobacter baumannii NOT DETECTED NOT DETECTED Final   Enterobacteriaceae species NOT DETECTED NOT DETECTED Final   Enterobacter cloacae complex NOT DETECTED NOT DETECTED Final   Escherichia coli NOT DETECTED NOT DETECTED Final   Klebsiella oxytoca NOT DETECTED NOT DETECTED Final   Klebsiella pneumoniae NOT DETECTED NOT DETECTED Final   Proteus species NOT DETECTED NOT DETECTED Final   Serratia marcescens NOT DETECTED NOT DETECTED Final   Haemophilus influenzae NOT DETECTED NOT DETECTED Final   Neisseria meningitidis NOT DETECTED NOT DETECTED Final   Pseudomonas aeruginosa NOT DETECTED  NOT DETECTED Final   Candida albicans NOT DETECTED NOT DETECTED Final   Candida glabrata NOT DETECTED NOT DETECTED  Final   Candida krusei NOT DETECTED NOT DETECTED Final   Candida parapsilosis NOT DETECTED NOT DETECTED Final   Candida tropicalis NOT DETECTED NOT DETECTED Final    Comment: Performed at [facility], [place] [address], [place], [place] [address]         Radiology Studies: Dg Chest Port 1 View  Result Date: 07/16/2018 CLINICAL DATA:  Shortness of breath EXAM: PORTABLE CHEST 1 VIEW COMPARISON:  07/15/2018 FINDINGS: Diffuse hazy bilateral airspace opacities are again noted. These have not significantly changed from prior study. There is no pneumothorax. There may be a trace left-sided pleural effusion. The cardiac silhouette is stable. In the facet pacemaker is noted. IMPRESSION: Stable diffuse bilateral hazy airspace opacities consistent with atypical pneumonia. Electronically Signed   By: Constance Holster M.D.   On: 07/16/2018 03:31        Scheduled Meds:  Continuous Infusions: . sodium chloride 1,000 mL (07/14/18 0835)  . fentaNYL infusion INTRAVENOUS 25 mcg/hr (07/17/18 1156)     LOS: 4 days    Time spent: 35 minutes  During this encounter: Patient Isolation: Airborne, contact, droplet HCP PPE: Face shield, head covering, N95, gown, gloves, and shoe covers    Edwin Dada, MD [facility] 07/17/2018, 12:00 PM     Please page through [place]:  [email or web address] Password [email or web address] If 7PM-7AM, please contact night-coverage

## 2018-07-17 NOTE — Progress Notes (Signed)
Patient was unresponsive to voice or pain throughout the night. Fentanyl administered PRN x 6 this shift for air hunger. Ativan PRN x1 this shift. Remains on 100% NRB in upright position with sats in mid to upper 90's. Remains tachycardic. UOP WDL via indwelling catheter.

## 2018-07-17 NOTE — Progress Notes (Addendum)
Per provider order, gave patient 100 mcg fentanyl bolus for air hunger. Per provider I will take patient off of NRB and place on [place] at 6L and will ween down.

## 2018-07-17 NOTE — Progress Notes (Signed)
Patient is unresponsive but grimacing and groaning. Administered 73mcg of Fentanyl. Patient remains on a 100% NRB. He is in the upright position at 52degrees. He is saturating at 99%. His heart rate is 135 tachycardic. He has a Foley Catheter with urine output.

## 2018-07-17 NOTE — Progress Notes (Signed)
Per provider orders, changed rate dose for fentanyl drip to 3mcg for air hunger.

## 2018-07-17 NOTE — Progress Notes (Signed)
Patient's wife, [name] was able to call and face time with him. She spoke to him for about 20 minutes. She is very grateful for the face time with him. Patient's wife would like to come and see patient again and wanted me to speak to provider. Told her I would speak to provider.

## 2018-07-17 NOTE — Progress Notes (Signed)
Called patient's wife and told her patient had a restful evening and continues to rest comfortably this morning. Told her I would continue to monitor patient throughout day. She asked if she could Face Time with patient today. I told her I would be happy to set up call. She will call and set up time later.

## 2018-07-17 NOTE — Progress Notes (Signed)
Spoke with [facility] and wife, [name]  will come at 1430 to visit with patient and say final goodbye. Wife is so very grateful to staff for allowing her to see patient. Will place patient in [place] [address] at 1430 for wife to visit.

## 2018-07-17 NOTE — Progress Notes (Addendum)
Patient was noted to be agitated. PAINAD score 4. 1mg  Ativan administered per PRN order. Fentanyl 43mcg recently administered at 0228 for air hunger. Patient does not respond to painful stimuli. Does not follow commands and has not opened his eyes this shift. Will continue to assess.

## 2018-07-17 NOTE — Progress Notes (Signed)
Called wife, [name] to update her. She was able to talk to him as I held the phone up to his ear to wish him a good night. Questions were encouraged. No questions at this time.

## 2018-07-18 ENCOUNTER — Other Ambulatory Visit: Payer: Self-pay

## 2018-07-18 LAB — CULTURE, BLOOD (ROUTINE X 2)
Culture: NO GROWTH
Special Requests: ADEQUATE

## 2018-07-24 NOTE — Progress Notes (Signed)
The [name] was called again so she could speak with the pt.

## 2018-07-24 NOTE — Progress Notes (Signed)
PROGRESS NOTE    Randy Sullivan  DGL:[id number] DOB: 11-20-1928 DOA: 07/07/2018 PCP: Prince Solian, MD      Brief Narrative:  Randy Sullivan is a 85 y.o. M with dementia, home-dwelling, HTN, hypothyroidism, OSA and recurrent syncope who presented with 3 days decreased mentation/responsiveness and also apparent dyspnea.  In ER, CXR showed RLL opacity, hypoxic requiring NRB and SARS-CoV-2 NAA was positive.  Given Actemra and admitted to [facility].  Subjective: Patient in bed fentanyl drip for comfort measures, obtunded unable to answer any questions but appears to be in no distress.  Assessment & Plan:  Patient with COVID-19 pneumonitis with acute hypoxic respiratory failure.  Admitted on 07/09/2018, transferred to my service on 07/28/18, has been transition to full comfort care few days ago currently on fentanyl drip.  In no distress but obtunded and appears close to death. Goal of care full comfort.  Expect death soon, wife updated.   See below other issues addressed this admission by prior physician.   Coronavirus pneumonitis with acute hypoxic respiratory failure Sepsis from RLL pneumonia Presented with tachycardia, fever, lactate >2 in setting of ongoing 2020 COVID-19 pandemic.  Suspect superimposed RLL CAP.  Procal >20.  Given Actemra on admission 5/21 and steroids.  Hep B SAg negative.  Had progressively worse respiratory failure, complicated by acute renal failure and Afib with RVR.    Mentation worsening in last 24 hours, renal function no improvement.  His prognosis for full recovery is low.  Have discussed goals of care with wife, who desires full comfort measures.  Will stop antibiotics, fluids and lab testing.    -Full comfort measures -Fentanyl gtt and pushes -Lorazepam for agitation    Acute kidney injury Baseline 1.0 mg/dL, Cr 1.8 on admission.      Renal US showed chronic disease, no hydronephrosis.  FeNA suggested renal underperfusion.  Trial of Lasix resulted  in no improvement in Cr, did not appears edematous, doubt congestive nephropathy. Urine without heme or RBCs to suggest COVID related angiopathy.  Likely sepsis related pre-renal injury.  Creatinine 2.8 today, no improvement.  Hyperkalemia  Acute metabolic encephalopathy From COVID, sepsis.  Worsening.  New onset atrial fibrillation Never previously noted on ILR.  Developed now in setting of COVID and sepsis.  Hypotensive with diltiazem gtt yesterday. -Stop rate controlling agents  Hypertension  Dementia  Hypothyroidism  Hyponatremia  Anemia From chronic disease  Coagulase negative staph positive blood culture Only a single culture.  Likely contaminant    DVT prophylaxis: Lovenox Code Status: DO NOT RESUSCITATE Family Communication: Wife  Consultants:   None  Procedures:   Renal US    Objective: Vitals:   07-28-2018 1000 2018-07-28 1200 July 28, 2018 1300 Jul 28, 2018 1400  BP:      Pulse: (!) 30 (!) 40 (!) 50 (!) 56  Resp: (!) 26 (!) 26 (!) 22 13  Temp:      TempSrc:      SpO2: (!) 84% (!) 84% (!) 84% (!) 88%  Weight:      Height:        Intake/Output Summary (Last 24 hours) at 2018-07-28 1504 Last data filed at 07/28/2018 1000 Gross per 24 hour  Intake 107.95 ml  Output 775 ml  Net -667.05 ml   Filed Weights   06/25/2018 0950  Weight: 76.7 kg    Examination:  Patient in bed, on fentanyl drip, obtunded unable to answer any questions but appears to be in no distress.  Shallow rapid breaths.  Goal  of care comfort continue.  Data Reviewed: I have personally reviewed following labs and imaging studies:  CBC: Recent Labs  Lab 06/23/2018 0932 07/14/18 0448 07/15/18 0447 07/16/18 0625 07/17/18 0500  WBC 4.2 9.9 11.9* 10.6* 10.6*  NEUTROABS 2.8 8.6* 10.8* 9.7* 9.9*  HGB 12.0* 11.5* 13.2 12.0* 11.5*  HCT 37.6* 33.9* 41.8 37.6* 36.7*  MCV 97.7 96.3 100.7* 99.5 99.7  PLT 167 140* 196 163 825*   Basic Metabolic Panel: Recent Labs  Lab 07/11/2018 0932  07/14/18 0448 07/14/18 0500 07/15/18 0447 07/15/18 1030 07/16/18 0625 07/17/18 0500  NA 133*  --  135 139  --  141 146*  K 3.8  --  4.3 6.3* 5.9* 5.1 5.0  CL 101  --  107 107  --  113* 119*  CO2 20*  --  16* 17*  --  15* 16*  GLUCOSE 96  --  130* 133*  --  120* 114*  BUN 28*  --  38* 55*  --  83* 89*  CREATININE 1.88*  --  2.01* 2.61*  --  2.84* 2.80*  CALCIUM 8.8*  --  8.2* 8.4*  --  7.9* 8.1*  MG  --  1.8  --  2.3  --  2.4  --   PHOS  --  5.1*  --   --   --   --   --    GFR: Estimated Creatinine Clearance: 19.4 mL/min (A) (by C-G formula based on SCr of 2.8 mg/dL (H)). Liver Function Tests: Recent Labs  Lab 07/10/2018 0932  AST 31  ALT 27  ALKPHOS 62  BILITOT 0.6  PROT 7.1  ALBUMIN 3.7   No results for input(s): LIPASE, AMYLASE in the last 168 hours. No results for input(s): AMMONIA in the last 168 hours. Coagulation Profile: No results for input(s): INR, PROTIME in the last 168 hours. Cardiac Enzymes: No results for input(s): CKTOTAL, CKMB, CKMBINDEX, TROPONINI in the last 168 hours. BNP (last 3 results) No results for input(s): PROBNP in the last 8760 hours. HbA1C: No results for input(s): HGBA1C in the last 72 hours. CBG: No results for input(s): GLUCAP in the last 168 hours. Lipid Profile: No results for input(s): CHOL, HDL, LDLCALC, TRIG, CHOLHDL, LDLDIRECT in the last 72 hours. Thyroid Function Tests: No results for input(s): TSH, T4TOTAL, FREET4, T3FREE, THYROIDAB in the last 72 hours. Anemia Panel: Recent Labs    07/16/18 0625 07/17/18 0500  FERRITIN 2,599* 1,812*   Urine analysis:    Component Value Date/Time   COLORURINE YELLOW 07/12/2018 0932   APPEARANCEUR CLEAR 06/30/2018 0932   LABSPEC 1.016 06/23/2018 0932   PHURINE 5.0 07/07/2018 0932   GLUCOSEU NEGATIVE 07/10/2018 0932   HGBUR NEGATIVE 06/25/2018 0932   BILIRUBINUR NEGATIVE 07/12/2018 0932   KETONESUR NEGATIVE 07/21/2018 0932   PROTEINUR 30 (A) 06/24/2018 0932   NITRITE NEGATIVE  07/07/2018 0932   LEUKOCYTESUR NEGATIVE 07/05/2018 0932   Sepsis Labs: @LABRCNTIP (procalcitonin:4,lacticacidven:4)  ) Recent Results (from the past 240 hour(s))  Blood Culture (routine x 2)     Status: Abnormal   Collection Time: 07/15/2018  9:32 AM  Result Value Ref Range Status   Specimen Description   Final    BLOOD RIGHT ANTECUBITAL Performed at [facility], [place] [address] [place], [place] [address]    Special Requests   Final    BOTTLES DRAWN AEROBIC AND ANAEROBIC Blood Culture adequate volume Performed at [facility] [address] [place], [place] [address]  Culture  Setup Time   Final    GRAM POSITIVE COCCI IN CLUSTERS ANAEROBIC BOTTLE ONLY Organism ID to follow CRITICAL RESULT CALLED TO, READ BACK BY AND VERIFIED WITH: Jene Every PharmD 12:05 07/14/18 ([email or web address])    Culture (A)  Final    STAPHYLOCOCCUS SPECIES (COAGULASE NEGATIVE) THE SIGNIFICANCE OF ISOLATING THIS ORGANISM FROM A SINGLE SET OF BLOOD CULTURES WHEN MULTIPLE SETS ARE DRAWN IS UNCERTAIN. PLEASE NOTIFY THE MICROBIOLOGY DEPARTMENT WITHIN ONE WEEK IF SPECIATION AND SENSITIVITIES ARE REQUIRED. Performed at [facility], [place] [address], [place], [place] [address]    Report Status 07/16/2018 FINAL  Final  Blood Culture (routine x 2)     Status: None   Collection Time: 07/17/2018  9:32 AM  Result Value Ref Range Status   Specimen Description   Final    BLOOD RIGHT ARM Performed at [facility] [address] [place], [place] [address]    Special Requests   Final    BOTTLES DRAWN AEROBIC AND ANAEROBIC Blood Culture adequate volume Performed at [facility] [address] [place], [place] [address]    Culture   Final    NO GROWTH 5 DAYS Performed at [facility], [place] [address] [place], [place] [address]    Report Status 27-Jul-2018 FINAL  Final  SARS Coronavirus 2 (CEPHEID- Performed in [facility] lab), Hosp Order      Status: Abnormal   Collection Time: 07/11/2018  9:32 AM  Result Value Ref Range Status   SARS Coronavirus 2 POSITIVE (A) NEGATIVE Final    Comment: RESULT CALLED TO, READ BACK BY AND VERIFIED WITH: S.BINGHAM RN AT 1124 ON 06/27/2018 BY S.VANHOORNE (NOTE) If result is NEGATIVE SARS-CoV-2 target nucleic acids are NOT DETECTED. The SARS-CoV-2 RNA is generally detectable in upper and lower  respiratory specimens during the acute phase of infection. The lowest  concentration of SARS-CoV-2 viral copies this assay can detect is 250  copies / mL. A negative result does not preclude SARS-CoV-2 infection  and should not be used as the sole basis for treatment or other  patient management decisions.  A negative result may occur with  improper specimen collection / handling, submission of specimen other  than nasopharyngeal swab, presence of viral mutation(s) within the  areas targeted by this assay, and inadequate number of viral copies  (<250 copies / mL). A negative result must be combined with clinical  observations, patient history, and epidemiological information. If result is POSITIVE SARS-CoV-2 target nucleic acids are  DETECTED. The SARS-CoV-2 RNA is generally detectable in upper and lower  respiratory specimens during the acute phase of infection.  Positive  results are indicative of active infection with SARS-CoV-2.  Clinical  correlation with patient history and other diagnostic information is  necessary to determine patient infection status.  Positive results do  not rule out bacterial infection or co-infection with other viruses. If result is PRESUMPTIVE POSTIVE SARS-CoV-2 nucleic acids MAY BE PRESENT.   A presumptive positive result was obtained on the submitted specimen  and confirmed on repeat testing.  While 2019 novel coronavirus  (SARS-CoV-2) nucleic acids may be present in the submitted sample  additional confirmatory testing may be necessary for epidemiological  and / or  clinical management purposes  to differentiate between  SARS-CoV-2 and other Sarbecovirus currently known to infect humans.  If clinically indicated additional testing with an alternate test  methodology  [phone]) is advised. The SARS-CoV-2 RNA is generally  detectable in upper and lower respiratory specimens during the acute  phase of infection. The expected result is Negative. Fact Sheet for Patients:  StrictlyIdeas.no Fact Sheet for Healthcare Providers: BankingDealers.co.za This test is not yet approved or cleared by the [place] FDA and has been authorized for detection and/or diagnosis of SARS-CoV-2 by FDA under an Emergency Use Authorization (EUA).  This EUA will remain in effect (meaning this test can be used) for the duration of the COVID-19 declaration under Section 564(b)(1) of the Act, 21 U.S.C. section 360bbb-3(b)(1), unless the authorization is terminated or revoked sooner. Performed at [facility], [place] [address] [place], [place] [address]   Urine culture     Status: None   Collection Time: 07/14/2018  9:32 AM  Result Value Ref Range Status   Specimen Description   Final    URINE, CLEAN CATCH Performed at [facility], [place] [address], [place], [place] [address]    Special Requests   Final    NONE Performed at [facility], [place] [address] [place], [place] [address]    Culture   Final    NO GROWTH Performed at [facility], [place] [address] [place], [place] [address]    Report Status 07/14/2018 FINAL  Final  Blood Culture ID Panel (Reflexed)     Status: Abnormal   Collection Time: 06/29/2018  9:32 AM  Result Value Ref Range Status   Enterococcus species NOT DETECTED NOT DETECTED Final   Listeria monocytogenes NOT DETECTED NOT DETECTED Final   Staphylococcus species DETECTED (A) NOT DETECTED Final    Comment: Methicillin (oxacillin) resistant  coagulase negative staphylococcus. Possible blood culture contaminant (unless isolated from more than one blood culture draw or clinical case suggests pathogenicity). No antibiotic treatment is indicated for blood  culture contaminants. CRITICAL RESULT CALLED TO, READ BACK BY AND VERIFIED WITH: Jene Every PharmD 12:05 07/14/18 ([email or web address])    Staphylococcus aureus (BCID) NOT DETECTED NOT DETECTED Final   Methicillin resistance DETECTED (A) NOT DETECTED Final    Comment: CRITICAL RESULT CALLED TO, READ BACK BY AND VERIFIED WITH: Jene Every PharmD 12:05 07/14/18 ([email or web address])    Streptococcus species NOT DETECTED NOT DETECTED Final   Streptococcus agalactiae NOT DETECTED NOT DETECTED Final   Streptococcus pneumoniae NOT DETECTED NOT DETECTED Final   Streptococcus pyogenes NOT DETECTED NOT DETECTED Final   Acinetobacter baumannii NOT DETECTED NOT DETECTED Final   Enterobacteriaceae species NOT DETECTED NOT DETECTED Final   Enterobacter cloacae complex NOT DETECTED NOT DETECTED Final   Escherichia coli NOT DETECTED NOT DETECTED Final   Klebsiella oxytoca NOT DETECTED NOT DETECTED Final   Klebsiella pneumoniae NOT DETECTED NOT DETECTED Final   Proteus species NOT DETECTED NOT DETECTED Final   Serratia marcescens NOT DETECTED NOT DETECTED Final   Haemophilus influenzae NOT DETECTED NOT DETECTED Final   Neisseria meningitidis NOT DETECTED NOT DETECTED Final   Pseudomonas aeruginosa NOT DETECTED NOT DETECTED Final   Candida albicans NOT DETECTED NOT DETECTED Final   Candida glabrata NOT DETECTED NOT DETECTED Final   Candida krusei NOT DETECTED NOT DETECTED Final   Candida parapsilosis NOT DETECTED NOT DETECTED Final   Candida tropicalis NOT DETECTED NOT DETECTED Final    Comment: Performed at [facility], [place] [address] [place], [place] [address]      Radiology Studies: No results found.  Scheduled Meds:  Continuous Infusions: . sodium chloride 1,000 mL (07/14/18 0835)  . fentaNYL infusion  INTRAVENOUS 50 mcg/hr (  08-10-2018 1444)     LOS: 5 days    Time spent: 25 minutes  Signature  Lala Lund M.D on Aug 10, 2018 at 3:04 PM   -  To page go to [email or web address]

## 2018-07-24 NOTE — Progress Notes (Signed)
Per PRN order 130mcg Fentanyl bolus given for air hunger @ 0622.

## 2018-07-24 NOTE — Death Summary Note (Signed)
[facility] Death Note                                                                                                                                                                                               Randy Sullivan, is a 85 y.o. male, DOB - Mar 25, 1928, [id number]:[id number]  Admit date - 07/15/2018   Admitting Physician Costin Karlyne Greenspan, MD  Outpatient Primary MD for the patient is Avva, Steva Ready, MD  LOS - 5  CC - SOB, AMS     Notification: Prince Solian, MD notified of death of 07-21-18   Date and Time of Death - 2018-07-21 @ 15.45pm  Pronounced by - RN  History of present illness:   Randy Sullivan is a 85 y.o. male with a history of dementia, HTN, hypothyroidism, OSA and recurrent syncope who presented with 3 days decreased mentation/responsiveness and also apparent dyspnea. In ER, CXR showed RLL opacity, hypoxic requiring NRB and SARS-CoV-2 NAA was positive. He was given Actemra and admitted to [facility] on 07/22/2018. Few days later despite proper medical care he continued to decline and he was transitioned to full comfort care, he was transferred to my care on 2018-07-21 and he passed away in comfort same day.    Final Diagnoses:  Cause if death - Covid 62 Pneumonitis  Signature  Lala Lund M.D on 2018/07/21 at 3:52 PM  [facility]  Office Phone [phone]  Total clinical and documentation time for today Under 30 minutes   Last Note      PROGRESS NOTE    Randy Sullivan  PXT:[id number] DOB: 09-19-1928 DOA: 07/11/2018 PCP: Prince Solian, MD      Brief Narrative:  [name] is a 85 y.o. M with dementia, home-dwelling, HTN, hypothyroidism, OSA and recurrent syncope who presented with 3 days decreased mentation/responsiveness and also apparent dyspnea.  In ER, CXR showed RLL opacity, hypoxic requiring NRB and SARS-CoV-2 NAA was positive.  Given Actemra and admitted  to [facility].  Subjective: Patient in bed fentanyl drip for comfort measures, obtunded unable to answer any questions but appears to be in no distress.  Assessment & Plan:  Patient with COVID-19 pneumonitis with acute hypoxic respiratory failure.  Admitted on 07/14/2018, transferred to my service on 2018-07-21, has been transition to full comfort care few days ago currently on fentanyl drip.  In no distress but obtunded and appears close to death. Goal of care full comfort.  Expect death soon, wife updated.   See below other issues addressed this admission by prior physician.   Coronavirus pneumonitis with acute hypoxic respiratory failure Sepsis from RLL pneumonia Presented with  tachycardia, fever, lactate >2 in setting of ongoing 2020 COVID-19 pandemic.  Suspect superimposed RLL CAP.  Procal >20.  Given Actemra on admission 5/21 and steroids.  Hep B SAg negative.  Had progressively worse respiratory failure, complicated by acute renal failure and Afib with RVR.    Mentation worsening in last 24 hours, renal function no improvement.  His prognosis for full recovery is low.  Have discussed goals of care with wife, who desires full comfort measures.  Will stop antibiotics, fluids and lab testing.    -Full comfort measures -Fentanyl gtt and pushes -Lorazepam for agitation    Acute kidney injury Baseline 1.0 mg/dL, Cr 1.8 on admission.      Renal US showed chronic disease, no hydronephrosis.  FeNA suggested renal underperfusion.  Trial of Lasix resulted in no improvement in Cr, did not appears edematous, doubt congestive nephropathy. Urine without heme or RBCs to suggest COVID related angiopathy.  Likely sepsis related pre-renal injury.  Creatinine 2.8 today, no improvement.  Hyperkalemia  Acute metabolic encephalopathy From COVID, sepsis.  Worsening.  New onset atrial fibrillation Never previously noted on ILR.  Developed now in setting of COVID and sepsis.  Hypotensive with diltiazem  gtt yesterday. -Stop rate controlling agents  Hypertension  Dementia  Hypothyroidism  Hyponatremia  Anemia From chronic disease  Coagulase negative staph positive blood culture Only a single culture.  Likely contaminant    DVT prophylaxis: Lovenox Code Status: DO NOT RESUSCITATE Family Communication: Wife  Consultants:   None  Procedures:   Renal US    Objective: Vitals:   2018/07/26 1000 07-26-2018 1200 26-Jul-2018 1300 07/26/18 1400  BP:      Pulse: (!) 30 (!) 40 (!) 50 (!) 56  Resp: (!) 26 (!) 26 (!) 22 13  Temp:      TempSrc:      SpO2: (!) 84% (!) 84% (!) 84% (!) 88%  Weight:      Height:        Intake/Output Summary (Last 24 hours) at 26-Jul-2018 1553 Last data filed at 07-26-2018 1000 Gross per 24 hour  Intake 104.79 ml  Output 775 ml  Net -670.21 ml   Filed Weights   07/17/2018 0950  Weight: 76.7 kg    Examination:  Patient in bed, on fentanyl drip, obtunded unable to answer any questions but appears to be in no distress.  Shallow rapid breaths.  Goal of care comfort continue.  Data Reviewed: I have personally reviewed following labs and imaging studies:  CBC: Recent Labs  Lab 07/09/2018 0932 07/14/18 0448 07/15/18 0447 07/16/18 0625 07/17/18 0500  WBC 4.2 9.9 11.9* 10.6* 10.6*  NEUTROABS 2.8 8.6* 10.8* 9.7* 9.9*  HGB 12.0* 11.5* 13.2 12.0* 11.5*  HCT 37.6* 33.9* 41.8 37.6* 36.7*  MCV 97.7 96.3 100.7* 99.5 99.7  PLT 167 140* 196 163 974*   Basic Metabolic Panel: Recent Labs  Lab 07/17/2018 0932 07/14/18 0448 07/14/18 0500 07/15/18 0447 07/15/18 1030 07/16/18 0625 07/17/18 0500  NA 133*  --  135 139  --  141 146*  K 3.8  --  4.3 6.3* 5.9* 5.1 5.0  CL 101  --  107 107  --  113* 119*  CO2 20*  --  16* 17*  --  15* 16*  GLUCOSE 96  --  130* 133*  --  120* 114*  BUN 28*  --  38* 55*  --  83* 89*  CREATININE 1.88*  --  2.01* 2.61*  --  2.84* 2.80*  CALCIUM 8.8*  --  8.2* 8.4*  --  7.9* 8.1*  MG  --  1.8  --  2.3  --  2.4  --   PHOS   --  5.1*  --   --   --   --   --    GFR: Estimated Creatinine Clearance: 19.4 mL/min (A) (by C-G formula based on SCr of 2.8 mg/dL (H)). Liver Function Tests: Recent Labs  Lab 07/19/2018 0932  AST 31  ALT 27  ALKPHOS 62  BILITOT 0.6  PROT 7.1  ALBUMIN 3.7   No results for input(s): LIPASE, AMYLASE in the last 168 hours. No results for input(s): AMMONIA in the last 168 hours. Coagulation Profile: No results for input(s): INR, PROTIME in the last 168 hours. Cardiac Enzymes: No results for input(s): CKTOTAL, CKMB, CKMBINDEX, TROPONINI in the last 168 hours. BNP (last 3 results) No results for input(s): PROBNP in the last 8760 hours. HbA1C: No results for input(s): HGBA1C in the last 72 hours. CBG: No results for input(s): GLUCAP in the last 168 hours. Lipid Profile: No results for input(s): CHOL, HDL, LDLCALC, TRIG, CHOLHDL, LDLDIRECT in the last 72 hours. Thyroid Function Tests: No results for input(s): TSH, T4TOTAL, FREET4, T3FREE, THYROIDAB in the last 72 hours. Anemia Panel: Recent Labs    07/16/18 0625 07/17/18 0500  FERRITIN 2,599* 1,812*   Urine analysis:    Component Value Date/Time   COLORURINE YELLOW 07/08/2018 0932   APPEARANCEUR CLEAR 07/14/2018 0932   LABSPEC 1.016 06/30/2018 0932   PHURINE 5.0 06/23/2018 0932   GLUCOSEU NEGATIVE 06/28/2018 0932   HGBUR NEGATIVE 07/12/2018 0932   BILIRUBINUR NEGATIVE 06/28/2018 0932   KETONESUR NEGATIVE 07/19/2018 0932   PROTEINUR 30 (A) 07/07/2018 0932   NITRITE NEGATIVE 07/01/2018 0932   LEUKOCYTESUR NEGATIVE 07/14/2018 0932   Sepsis Labs: @LABRCNTIP (procalcitonin:4,lacticacidven:4)  ) Recent Results (from the past 240 hour(s))  Blood Culture (routine x 2)     Status: Abnormal   Collection Time: 07/09/2018  9:32 AM  Result Value Ref Range Status   Specimen Description   Final    BLOOD RIGHT ANTECUBITAL Performed at [facility], [place] [address] [place], [place] [address]    Special Requests   Final     BOTTLES DRAWN AEROBIC AND ANAEROBIC Blood Culture adequate volume Performed at [place] [address], [place], [place] [address]    Culture  Setup Time   Final    GRAM POSITIVE COCCI IN CLUSTERS ANAEROBIC BOTTLE ONLY Organism ID to follow CRITICAL RESULT CALLED TO, READ BACK BY AND VERIFIED WITH: Jene Every PharmD 12:05 07/14/18 ([email or web address])    Culture (A)  Final    STAPHYLOCOCCUS SPECIES (COAGULASE NEGATIVE) THE SIGNIFICANCE OF ISOLATING THIS ORGANISM FROM A SINGLE SET OF BLOOD CULTURES WHEN MULTIPLE SETS ARE DRAWN IS UNCERTAIN. PLEASE NOTIFY THE MICROBIOLOGY DEPARTMENT WITHIN ONE WEEK IF SPECIATION AND SENSITIVITIES ARE REQUIRED. Performed at [facility], [place] [address], [place], [place] [address]    Report Status 07/16/2018 FINAL  Final  Blood Culture (routine x 2)     Status: None   Collection Time: 07/22/2018  9:32 AM  Result Value Ref Range Status   Specimen Description   Final    BLOOD RIGHT ARM Performed at [facility] [address], [place], [place] [address]    Special Requests   Final    BOTTLES DRAWN AEROBIC AND ANAEROBIC Blood Culture adequate volume Performed at [facility], [address] [place], [place] [address]    Culture   Final    NO GROWTH 5 DAYS Performed at [facility], [place] [address], [place], [place] [address]    Report Status 07-31-18 FINAL  Final  SARS Coronavirus 2 (CEPHEID- Performed in [facility] hospital lab), Hosp Order     Status: Abnormal   Collection Time: 07/04/2018  9:32 AM  Result Value Ref Range Status   SARS Coronavirus 2 POSITIVE (A) NEGATIVE Final    Comment: RESULT CALLED TO, READ BACK BY AND VERIFIED WITH: S.BINGHAM RN AT 1124 ON 06/25/2018 BY S.VANHOORNE (NOTE) If result is NEGATIVE SARS-CoV-2 target nucleic acids are NOT DETECTED. The SARS-CoV-2 RNA is generally detectable in upper and lower  respiratory specimens during the acute phase of infection. The  lowest  concentration of SARS-CoV-2 viral copies this assay can detect is 250  copies / mL. A negative result does not preclude SARS-CoV-2 infection  and should not be used as the sole basis for treatment or other  patient management decisions.  A negative result may occur with  improper specimen collection / handling, submission of specimen other  than nasopharyngeal swab, presence of viral mutation(s) within the  areas targeted by this assay, and inadequate number of viral copies  (<250 copies / mL). A negative result must be combined with clinical  observations, patient history, and epidemiological information. If result is POSITIVE SARS-CoV-2 target nucleic acids are  DETECTED. The SARS-CoV-2 RNA is generally detectable in upper and lower  respiratory specimens during the acute phase of infection.  Positive  results are indicative of active infection with SARS-CoV-2.  Clinical  correlation with patient history and other diagnostic information is  necessary to determine patient infection status.  Positive results do  not rule out bacterial infection or co-infection with other viruses. If result is PRESUMPTIVE POSTIVE SARS-CoV-2 nucleic acids MAY BE PRESENT.   A presumptive positive result was obtained on the submitted specimen  and confirmed on repeat testing.  While 2019 novel coronavirus  (SARS-CoV-2) nucleic acids may be present in the submitted sample  additional confirmatory testing may be necessary for epidemiological  and / or clinical management purposes  to differentiate between  SARS-CoV-2 and other Sarbecovirus currently known to infect humans.  If clinically indicated additional testing with an alternate test  methodology  [phone]) is advised. The SARS-CoV-2 RNA is generally  detectable in upper and lower respiratory specimens during the acute  phase of infection. The expected result is Negative. Fact Sheet for Patients:  StrictlyIdeas.no  Fact Sheet for Healthcare Providers: BankingDealers.co.za This test is not yet approved or cleared by the [place] FDA and has been authorized for detection and/or diagnosis of SARS-CoV-2 by FDA under an Emergency Use Authorization (EUA).  This EUA will remain in effect (meaning this test can be used) for the duration of the COVID-19 declaration under Section 564(b)(1) of the Act, 21 U.S.C. section 360bbb-3(b)(1), unless the authorization is terminated or revoked sooner. Performed at [facility], [place] [address], [place], [place] [address]   Urine culture     Status: None   Collection Time: 06/26/2018  9:32 AM  Result Value Ref Range Status   Specimen Description   Final    URINE, CLEAN CATCH Performed at [facility], [place] [address], [place], [place] [address]    Special Requests   Final    NONE Performed at [facility], [place] [address] [place], [place]  [address]    Culture   Final    NO GROWTH Performed at [facility], [place] [address] [place], [place] [address]    Report Status 07/14/2018 FINAL  Final  Blood Culture ID Panel (Reflexed)     Status: Abnormal   Collection Time: 07/02/2018  9:32 AM  Result Value Ref Range Status   Enterococcus species NOT DETECTED NOT DETECTED Final   Listeria monocytogenes NOT DETECTED NOT DETECTED Final   Staphylococcus species DETECTED (A) NOT DETECTED Final    Comment: Methicillin (oxacillin) resistant coagulase negative staphylococcus. Possible blood culture contaminant (unless isolated from more than one blood culture draw or clinical case suggests pathogenicity). No antibiotic treatment is indicated for blood  culture contaminants. CRITICAL RESULT CALLED TO, READ BACK BY AND VERIFIED WITH: Jene Every PharmD 12:05 07/14/18 ([email or web address])    Staphylococcus aureus (BCID) NOT DETECTED NOT DETECTED Final   Methicillin resistance DETECTED (A) NOT DETECTED Final     Comment: CRITICAL RESULT CALLED TO, READ BACK BY AND VERIFIED WITH: Jene Every PharmD 12:05 07/14/18 ([email or web address])    Streptococcus species NOT DETECTED NOT DETECTED Final   Streptococcus agalactiae NOT DETECTED NOT DETECTED Final   Streptococcus pneumoniae NOT DETECTED NOT DETECTED Final   Streptococcus pyogenes NOT DETECTED NOT DETECTED Final   Acinetobacter baumannii NOT DETECTED NOT DETECTED Final   Enterobacteriaceae species NOT DETECTED NOT DETECTED Final   Enterobacter cloacae complex NOT DETECTED NOT DETECTED Final   Escherichia coli NOT DETECTED NOT DETECTED Final   Klebsiella oxytoca NOT DETECTED NOT DETECTED Final   Klebsiella pneumoniae NOT DETECTED NOT DETECTED Final   Proteus species NOT DETECTED NOT DETECTED Final   Serratia marcescens NOT DETECTED NOT DETECTED Final   Haemophilus influenzae NOT DETECTED NOT DETECTED Final   Neisseria meningitidis NOT DETECTED NOT DETECTED Final   Pseudomonas aeruginosa NOT DETECTED NOT DETECTED Final   Candida albicans NOT DETECTED NOT DETECTED Final   Candida glabrata NOT DETECTED NOT DETECTED Final   Candida krusei NOT DETECTED NOT DETECTED Final   Candida parapsilosis NOT DETECTED NOT DETECTED Final   Candida tropicalis NOT DETECTED NOT DETECTED Final    Comment: Performed at [facility], [place] [place], [place] [address]      Radiology Studies: No results found.  Scheduled Meds:  Continuous Infusions: . sodium chloride 1,000 mL (07/14/18 0835)  . fentaNYL infusion INTRAVENOUS 50 mcg/hr (03-Aug-2018 1444)     LOS: 5 days    Time spent: 25 minutes  Signature  Lala Lund M.D on 03-Aug-2018 at 3:53 PM   -  To page go to [email or web address]

## 2018-07-24 NOTE — Progress Notes (Addendum)
I tried to call the [name] twice, but the phone says user busy. I will try again later.   The [name] was called and given a daily update regarding the pt's current status.

## 2018-07-24 NOTE — Progress Notes (Signed)
[name] was called again so she could speak to the pt.

## 2018-07-24 DEATH — deceased

## 2018-11-01 IMAGING — RF DG SWALLOWING FUNCTION
5 series · 20 of 20 positions shown · non-contrast
Comparison: None.

CLINICAL DATA: 88-year-old male with dysphagia.  Initial encounter.

EXAM:
MODIFIED BARIUM SWALLOW
TECHNIQUE: Different consistencies of barium were administered orally to the
patient by the Speech Pathologist. Imaging of the pharynx was
performed in the lateral projection.
FLUOROSCOPY TIME:  Fluoroscopy Time:  36 seconds
Radiation Exposure Index (if provided by the fluoroscopic device):
2.6 mGy

[Series 1: cp_standard · 0.51mm/px · 4 of 82 frames shown (1 of 5)]
[frame 2/82]
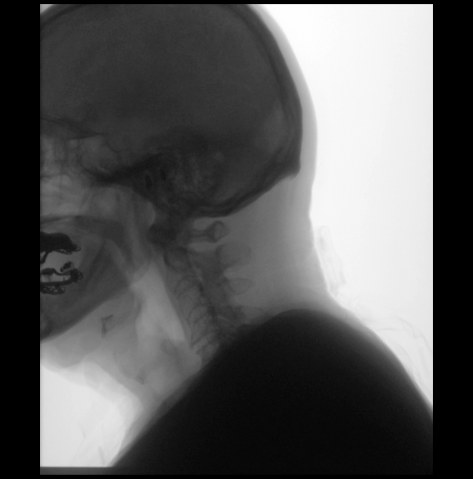
[frame 13/82]
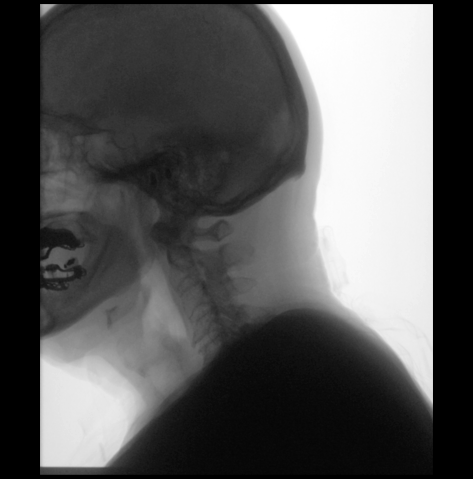
[frame 42/82]
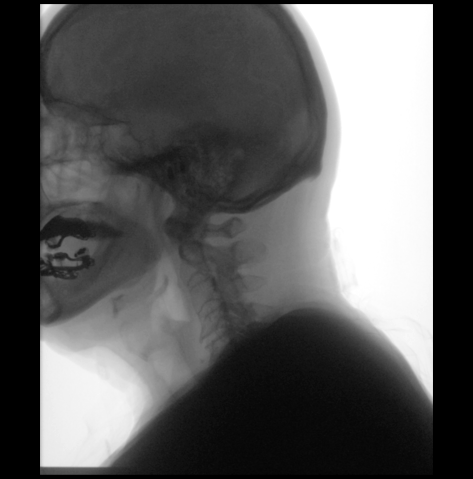
[frame 70/82]
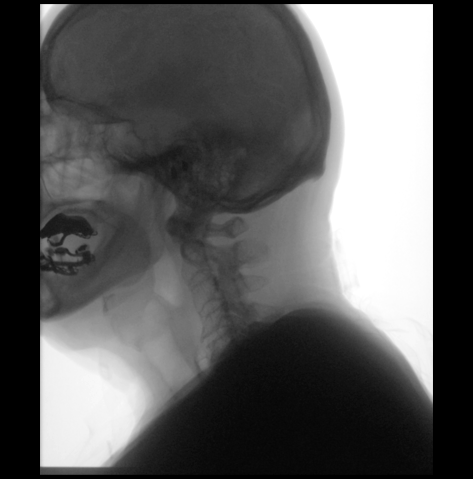

[Series 2: cp_standard · 0.51mm/px · 4 of 54 frames shown (2 of 5)]
[frame 1/54]
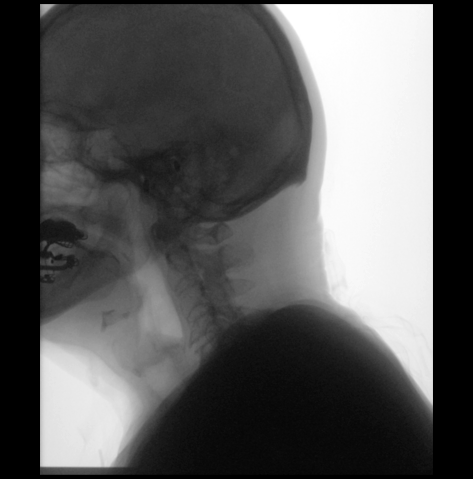
[frame 9/54]
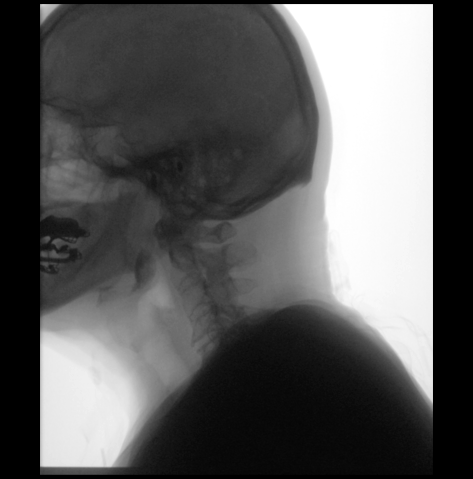
[frame 28/54]
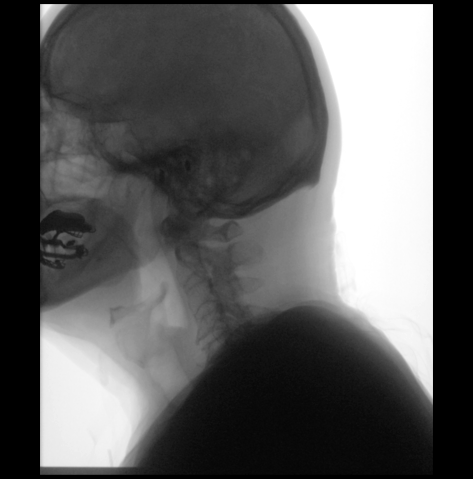
[frame 46/54]
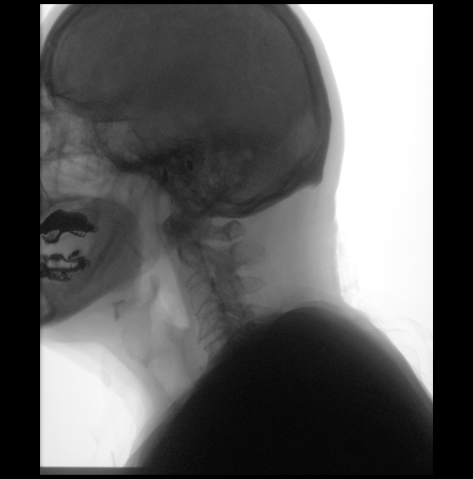

[Series 3: cp_standard · 0.51mm/px · 4 of 83 frames shown (3 of 5)]
[frame 10/83]
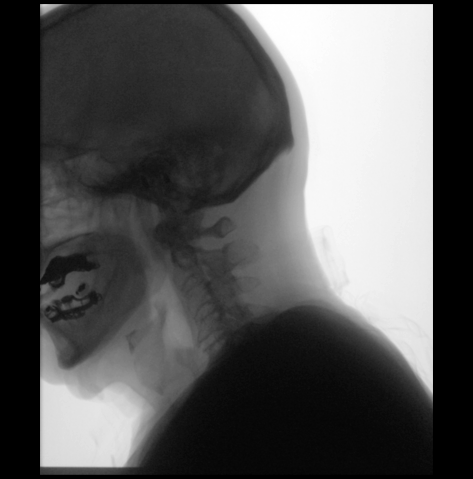
[frame 13/83]
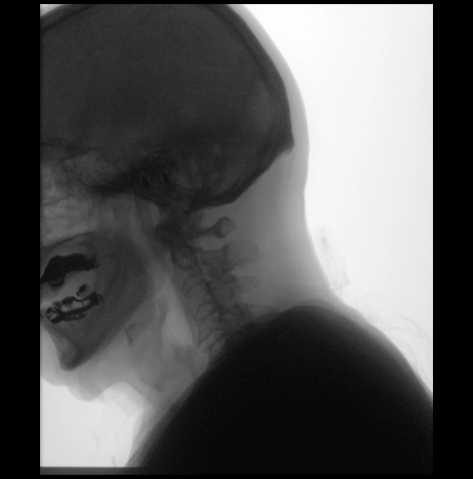
[frame 42/83]
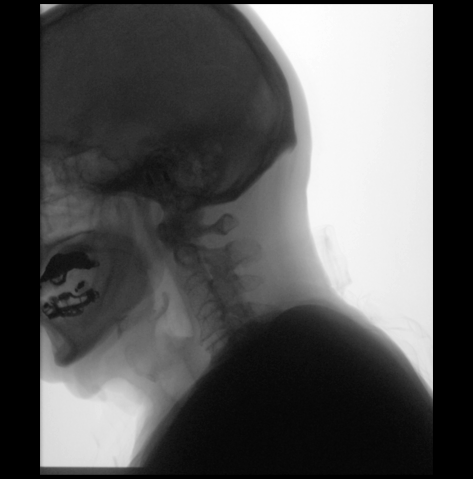
[frame 71/83]
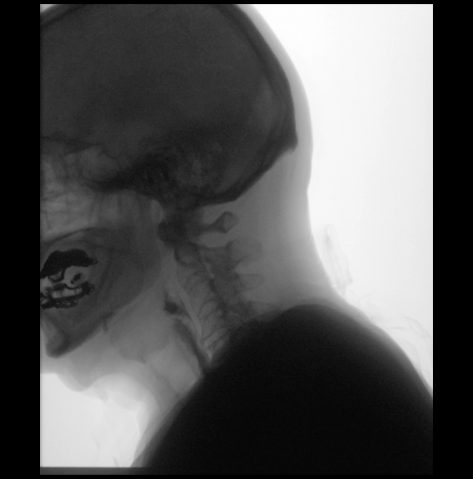

[Series 4: cp_standard · 0.51mm/px · 4 of 155 frames shown (4 of 5)]
[frame 5/155]
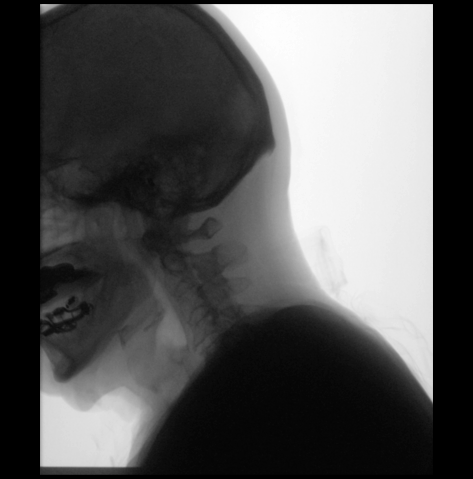
[frame 24/155]
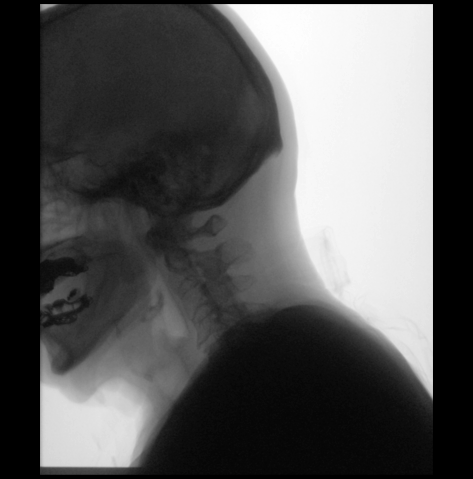
[frame 78/155]
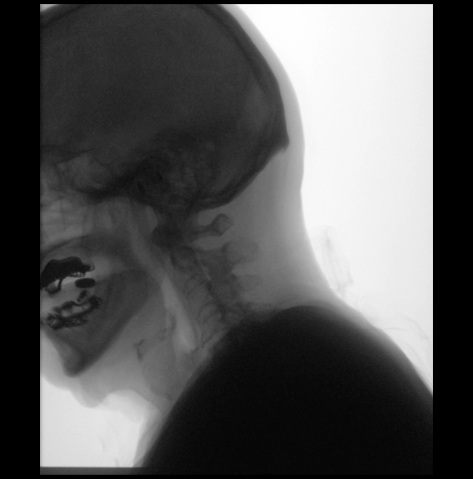
[frame 132/155]
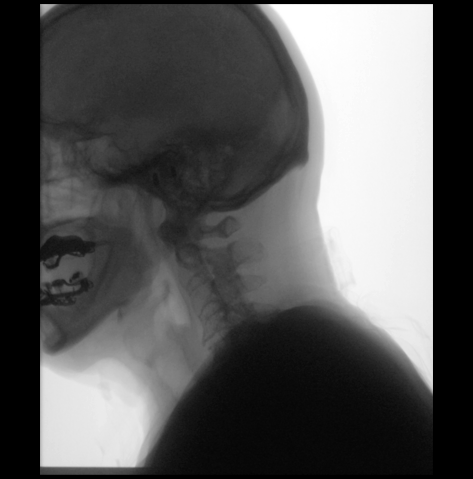

[Series 5: cp_standard · 0.51mm/px · 4 of 61 frames shown (5 of 5)]
[frame 10/61]
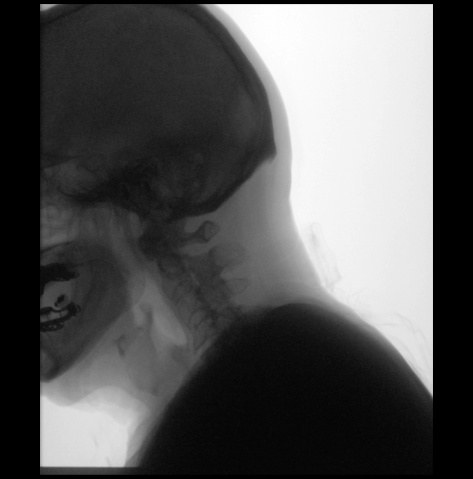
[frame 14/61]
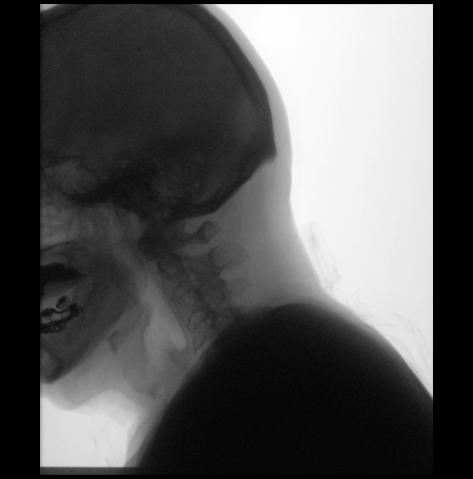
[frame 31/61]
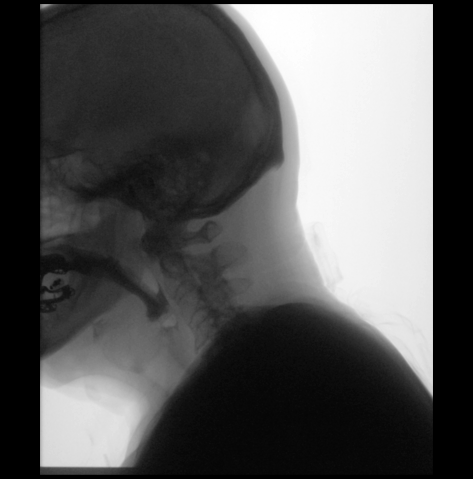
[frame 52/61]
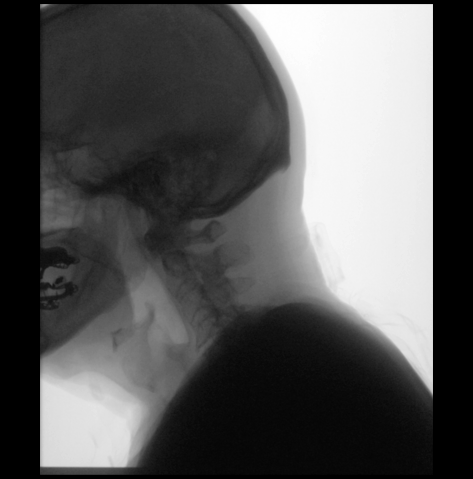

[20 of 20 positions shown; findings below may reference images not displayed]

FINDINGS: Thin liquid- within normal limits. Minimal impression upon the
cervical esophagus by C4-5 anterior osteophyte.

Tiger?Zc with cracker- within normal limits

Barium tablet -  delayed or pharyngeal phase.
IMPRESSION: Delayed oropharyngeal phase with difficulty propelling 13 mm barium
tablet otherwise negative exam.

Please refer to the Speech Pathologists report for complete details
and recommendations.

## 2019-03-05 IMAGING — CR DG KNEE COMPLETE 4+V*L*
4 series · 4 of 4 positions shown · non-contrast
Comparison: None.

CLINICAL DATA: Left knee pain after surgery 2 weeks ago.

EXAM:
LEFT KNEE - COMPLETE 4+ VIEW

[x knee ap left (1 of 3)]
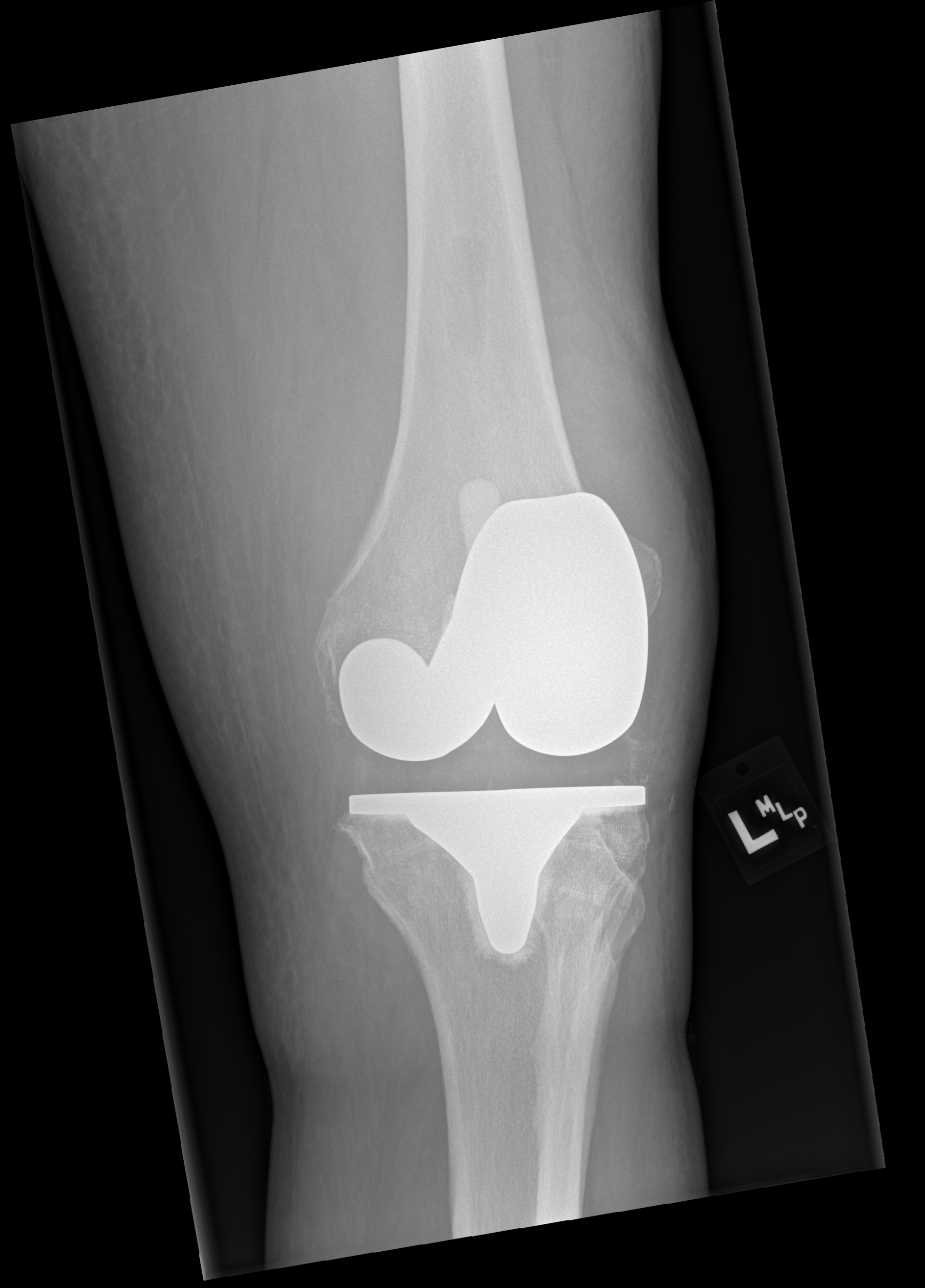

[x knee ap left (2 of 3)]
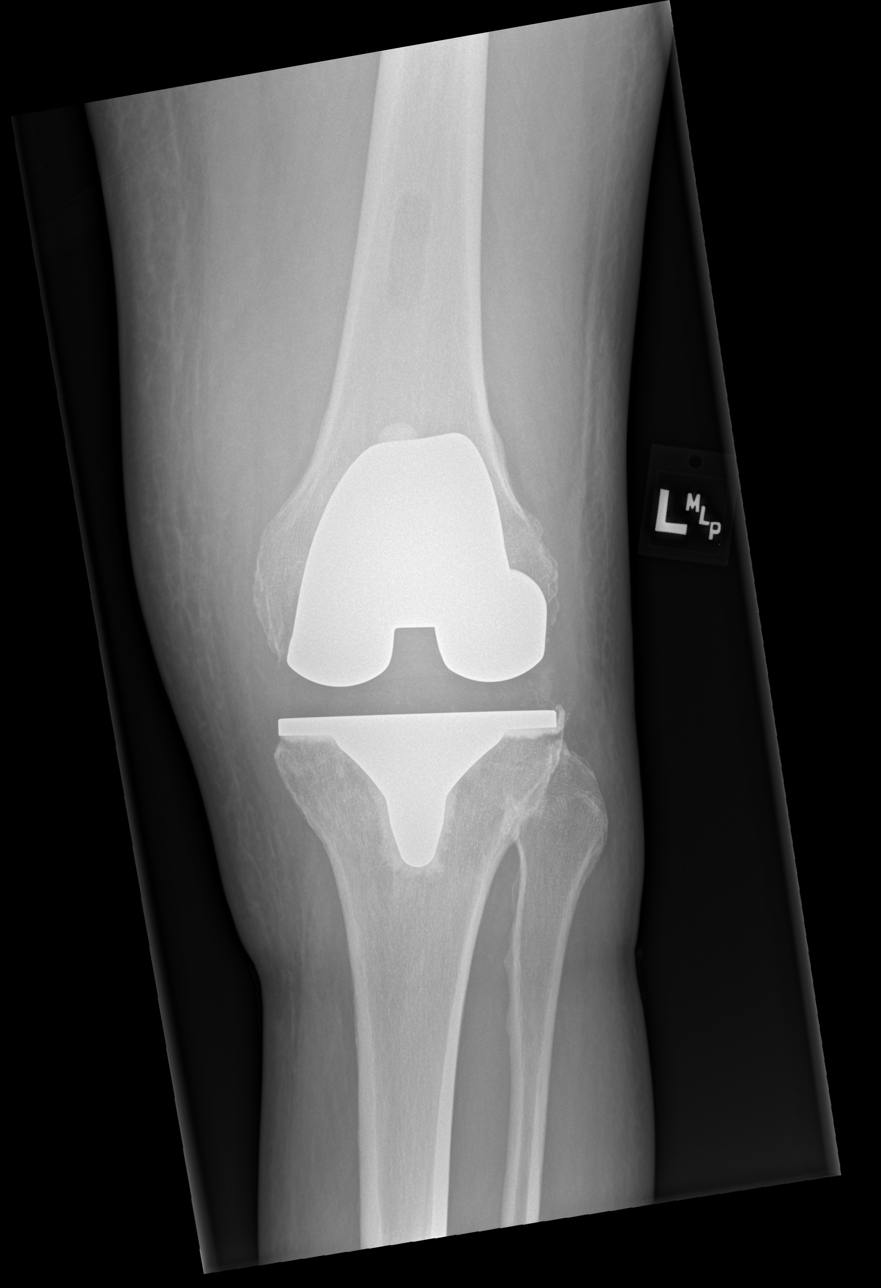

[x knee ap left (3 of 3)]
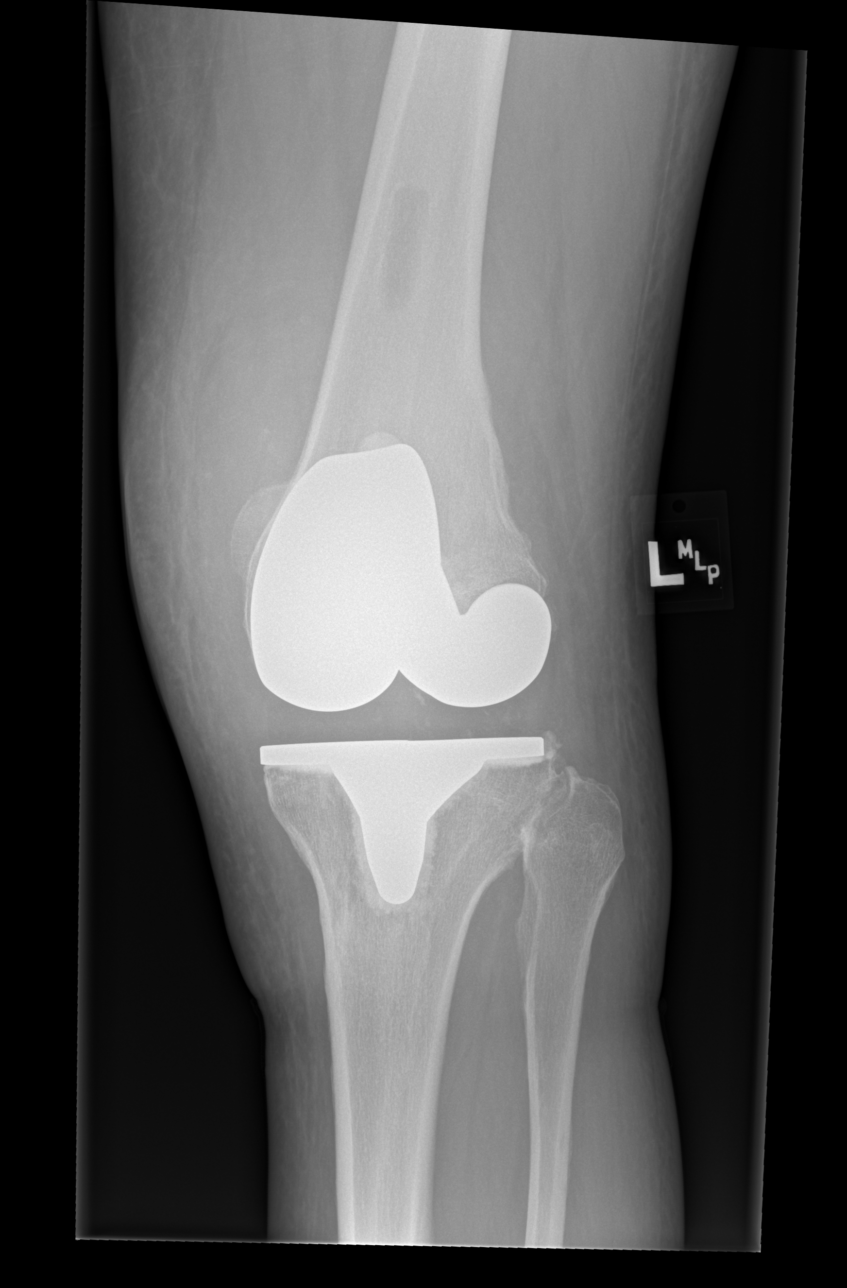

[x knee lat left]
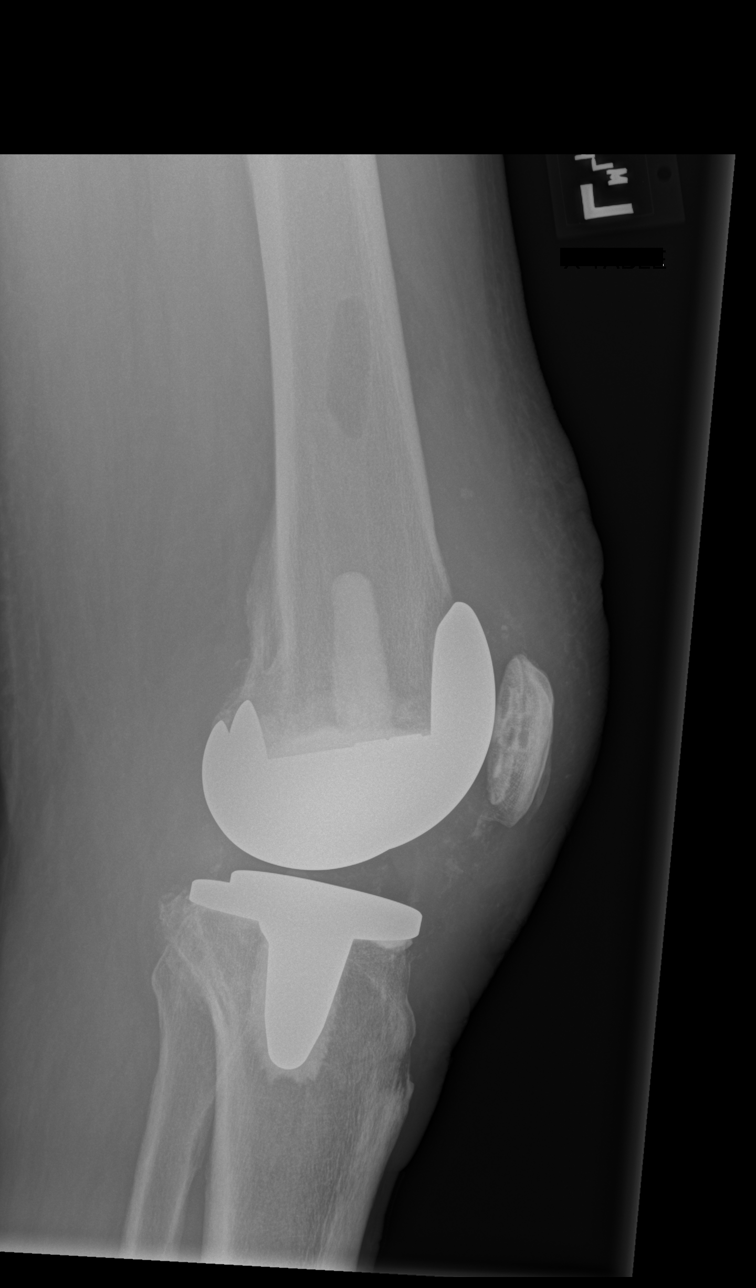

[4 of 4 positions shown; findings below may reference images not displayed]

FINDINGS: Status post left total knee arthroplasty. The femoral and tibial
components are well situated. No fracture or dislocation is noted.
No joint effusion is noted.
IMPRESSION: Status post left total knee arthroplasty. No acute abnormality seen
the left knee.
